# Patient Record
Sex: Female | Born: 1970 | Race: Black or African American | Hispanic: No | Smoking: Never smoker
Health system: Southern US, Community
[De-identification: ages and names within clinical notes are randomized; demographics above are authoritative.]

## PROBLEM LIST (undated history)

## (undated) DIAGNOSIS — F32A Depression, unspecified: Secondary | ICD-10-CM

## (undated) DIAGNOSIS — Z8709 Personal history of other diseases of the respiratory system: Secondary | ICD-10-CM

## (undated) DIAGNOSIS — N159 Renal tubulo-interstitial disease, unspecified: Secondary | ICD-10-CM

## (undated) DIAGNOSIS — N920 Excessive and frequent menstruation with regular cycle: Secondary | ICD-10-CM

## (undated) DIAGNOSIS — T8859XA Other complications of anesthesia, initial encounter: Secondary | ICD-10-CM

## (undated) DIAGNOSIS — K219 Gastro-esophageal reflux disease without esophagitis: Secondary | ICD-10-CM

## (undated) DIAGNOSIS — O139 Gestational [pregnancy-induced] hypertension without significant proteinuria, unspecified trimester: Secondary | ICD-10-CM

## (undated) DIAGNOSIS — Z87898 Personal history of other specified conditions: Secondary | ICD-10-CM

## (undated) DIAGNOSIS — G4730 Sleep apnea, unspecified: Secondary | ICD-10-CM

## (undated) DIAGNOSIS — O24419 Gestational diabetes mellitus in pregnancy, unspecified control: Secondary | ICD-10-CM

## (undated) DIAGNOSIS — A599 Trichomoniasis, unspecified: Secondary | ICD-10-CM

## (undated) DIAGNOSIS — Z9289 Personal history of other medical treatment: Secondary | ICD-10-CM

## (undated) DIAGNOSIS — J189 Pneumonia, unspecified organism: Secondary | ICD-10-CM

## (undated) DIAGNOSIS — R51 Headache: Secondary | ICD-10-CM

## (undated) DIAGNOSIS — Z86718 Personal history of other venous thrombosis and embolism: Secondary | ICD-10-CM

## (undated) DIAGNOSIS — F53 Postpartum depression: Secondary | ICD-10-CM

## (undated) DIAGNOSIS — T4145XA Adverse effect of unspecified anesthetic, initial encounter: Secondary | ICD-10-CM

## (undated) DIAGNOSIS — D219 Benign neoplasm of connective and other soft tissue, unspecified: Secondary | ICD-10-CM

## (undated) DIAGNOSIS — F329 Major depressive disorder, single episode, unspecified: Secondary | ICD-10-CM

## (undated) DIAGNOSIS — D649 Anemia, unspecified: Secondary | ICD-10-CM

## (undated) DIAGNOSIS — I10 Essential (primary) hypertension: Secondary | ICD-10-CM

## (undated) DIAGNOSIS — B9689 Other specified bacterial agents as the cause of diseases classified elsewhere: Secondary | ICD-10-CM

## (undated) DIAGNOSIS — O99345 Other mental disorders complicating the puerperium: Secondary | ICD-10-CM

## (undated) DIAGNOSIS — N760 Acute vaginitis: Secondary | ICD-10-CM

## (undated) HISTORY — DX: Personal history of other venous thrombosis and embolism: Z86.718

## (undated) HISTORY — DX: Headache: R51

## (undated) HISTORY — PX: WISDOM TOOTH EXTRACTION: SHX21

## (undated) HISTORY — DX: Postpartum depression: F53.0

## (undated) HISTORY — DX: Gestational diabetes mellitus in pregnancy, unspecified control: O24.419

## (undated) HISTORY — DX: Personal history of other medical treatment: Z92.89

## (undated) HISTORY — DX: Major depressive disorder, single episode, unspecified: F32.9

## (undated) HISTORY — DX: Depression, unspecified: F32.A

## (undated) HISTORY — DX: Other mental disorders complicating the puerperium: O99.345

## (undated) HISTORY — DX: Anemia, unspecified: D64.9

## (undated) HISTORY — DX: Essential (primary) hypertension: I10

## (undated) HISTORY — DX: Excessive and frequent menstruation with regular cycle: N92.0

## (undated) HISTORY — DX: Gestational (pregnancy-induced) hypertension without significant proteinuria, unspecified trimester: O13.9

## (undated) HISTORY — PX: TUBAL LIGATION: SHX77

## (undated) HISTORY — PX: DILATION AND CURETTAGE OF UTERUS: SHX78

## (undated) HISTORY — DX: Trichomoniasis, unspecified: A59.9

## (undated) HISTORY — DX: Benign neoplasm of connective and other soft tissue, unspecified: D21.9

## (undated) HISTORY — DX: Renal tubulo-interstitial disease, unspecified: N15.9

## (undated) HISTORY — DX: Acute vaginitis: N76.0

## (undated) HISTORY — DX: Other specified bacterial agents as the cause of diseases classified elsewhere: B96.89

---

## 1997-09-15 ENCOUNTER — Encounter: Admission: RE | Admit: 1997-09-15 | Discharge: 1997-09-15 | Payer: Self-pay | Admitting: *Deleted

## 1997-10-12 ENCOUNTER — Emergency Department (HOSPITAL_COMMUNITY): Admission: EM | Admit: 1997-10-12 | Discharge: 1997-10-12 | Payer: Self-pay | Admitting: Emergency Medicine

## 1998-07-03 ENCOUNTER — Encounter: Payer: Self-pay | Admitting: Emergency Medicine

## 1998-07-03 ENCOUNTER — Emergency Department (HOSPITAL_COMMUNITY): Admission: EM | Admit: 1998-07-03 | Discharge: 1998-07-03 | Payer: Self-pay | Admitting: Emergency Medicine

## 1998-11-12 ENCOUNTER — Ambulatory Visit (HOSPITAL_COMMUNITY): Admission: RE | Admit: 1998-11-12 | Discharge: 1998-11-12 | Payer: Self-pay | Admitting: Obstetrics

## 1999-01-17 ENCOUNTER — Encounter: Admission: RE | Admit: 1999-01-17 | Discharge: 1999-01-17 | Payer: Self-pay | Admitting: Obstetrics

## 1999-01-24 ENCOUNTER — Encounter: Admission: RE | Admit: 1999-01-24 | Discharge: 1999-01-24 | Payer: Self-pay | Admitting: Obstetrics

## 1999-03-29 ENCOUNTER — Inpatient Hospital Stay (HOSPITAL_COMMUNITY): Admission: AD | Admit: 1999-03-29 | Discharge: 1999-03-31 | Payer: Self-pay | Admitting: *Deleted

## 1999-03-30 ENCOUNTER — Encounter: Payer: Self-pay | Admitting: *Deleted

## 1999-04-03 ENCOUNTER — Encounter: Admission: RE | Admit: 1999-04-03 | Discharge: 1999-04-03 | Payer: Self-pay | Admitting: Obstetrics & Gynecology

## 1999-04-05 ENCOUNTER — Encounter (HOSPITAL_COMMUNITY): Admission: RE | Admit: 1999-04-05 | Discharge: 1999-04-10 | Payer: Self-pay | Admitting: *Deleted

## 1999-04-05 ENCOUNTER — Encounter: Admission: RE | Admit: 1999-04-05 | Discharge: 1999-07-04 | Payer: Self-pay | Admitting: Obstetrics and Gynecology

## 1999-04-09 ENCOUNTER — Inpatient Hospital Stay (HOSPITAL_COMMUNITY): Admission: AD | Admit: 1999-04-09 | Discharge: 1999-04-11 | Payer: Self-pay | Admitting: *Deleted

## 1999-04-09 ENCOUNTER — Encounter (INDEPENDENT_AMBULATORY_CARE_PROVIDER_SITE_OTHER): Payer: Self-pay

## 1999-04-13 ENCOUNTER — Inpatient Hospital Stay (HOSPITAL_COMMUNITY): Admission: AD | Admit: 1999-04-13 | Discharge: 1999-04-20 | Payer: Self-pay | Admitting: *Deleted

## 1999-04-16 ENCOUNTER — Encounter: Payer: Self-pay | Admitting: *Deleted

## 1999-04-19 ENCOUNTER — Encounter: Payer: Self-pay | Admitting: Obstetrics

## 1999-06-20 ENCOUNTER — Encounter: Admission: RE | Admit: 1999-06-20 | Discharge: 1999-06-20 | Payer: Self-pay | Admitting: Family Medicine

## 2000-02-11 ENCOUNTER — Emergency Department (HOSPITAL_COMMUNITY): Admission: EM | Admit: 2000-02-11 | Discharge: 2000-02-12 | Payer: Self-pay | Admitting: Emergency Medicine

## 2000-08-24 ENCOUNTER — Emergency Department (HOSPITAL_COMMUNITY): Admission: EM | Admit: 2000-08-24 | Discharge: 2000-08-24 | Payer: Self-pay | Admitting: Internal Medicine

## 2001-08-03 ENCOUNTER — Encounter: Admission: RE | Admit: 2001-08-03 | Discharge: 2001-08-03 | Payer: Self-pay | Admitting: Family Medicine

## 2001-08-03 ENCOUNTER — Other Ambulatory Visit: Admission: RE | Admit: 2001-08-03 | Discharge: 2001-08-03 | Payer: Self-pay | Admitting: Family Medicine

## 2001-08-24 ENCOUNTER — Encounter: Admission: RE | Admit: 2001-08-24 | Discharge: 2001-08-24 | Payer: Self-pay | Admitting: Family Medicine

## 2003-04-27 ENCOUNTER — Encounter: Admission: RE | Admit: 2003-04-27 | Discharge: 2003-04-27 | Payer: Self-pay | Admitting: Family Medicine

## 2003-05-11 ENCOUNTER — Encounter: Admission: RE | Admit: 2003-05-11 | Discharge: 2003-05-11 | Payer: Self-pay | Admitting: Family Medicine

## 2003-05-15 ENCOUNTER — Encounter: Admission: RE | Admit: 2003-05-15 | Discharge: 2003-05-15 | Payer: Self-pay | Admitting: Family Medicine

## 2003-05-25 ENCOUNTER — Other Ambulatory Visit: Admission: RE | Admit: 2003-05-25 | Discharge: 2003-05-25 | Payer: Self-pay | Admitting: Family Medicine

## 2003-05-25 ENCOUNTER — Encounter: Admission: RE | Admit: 2003-05-25 | Discharge: 2003-05-25 | Payer: Self-pay | Admitting: Family Medicine

## 2003-05-25 ENCOUNTER — Encounter: Payer: Self-pay | Admitting: Family Medicine

## 2003-06-06 ENCOUNTER — Encounter: Admission: RE | Admit: 2003-06-06 | Discharge: 2003-06-06 | Payer: Self-pay | Admitting: Family Medicine

## 2003-06-29 ENCOUNTER — Encounter: Admission: RE | Admit: 2003-06-29 | Discharge: 2003-09-27 | Payer: Self-pay | Admitting: Family Medicine

## 2003-07-04 ENCOUNTER — Encounter: Admission: RE | Admit: 2003-07-04 | Discharge: 2003-07-04 | Payer: Self-pay | Admitting: Family Medicine

## 2004-09-26 ENCOUNTER — Ambulatory Visit: Payer: Self-pay | Admitting: Family Medicine

## 2004-10-29 ENCOUNTER — Ambulatory Visit: Payer: Self-pay | Admitting: Sports Medicine

## 2004-11-12 ENCOUNTER — Emergency Department (HOSPITAL_COMMUNITY): Admission: EM | Admit: 2004-11-12 | Discharge: 2004-11-13 | Payer: Self-pay | Admitting: Emergency Medicine

## 2004-12-12 ENCOUNTER — Ambulatory Visit: Payer: Self-pay | Admitting: Family Medicine

## 2005-09-28 ENCOUNTER — Encounter (INDEPENDENT_AMBULATORY_CARE_PROVIDER_SITE_OTHER): Payer: Self-pay | Admitting: *Deleted

## 2005-09-28 LAB — CONVERTED CEMR LAB

## 2005-10-07 ENCOUNTER — Other Ambulatory Visit: Admission: RE | Admit: 2005-10-07 | Discharge: 2005-10-07 | Payer: Self-pay | Admitting: Obstetrics and Gynecology

## 2005-10-16 ENCOUNTER — Ambulatory Visit: Payer: Self-pay | Admitting: Family Medicine

## 2005-11-04 ENCOUNTER — Emergency Department (HOSPITAL_COMMUNITY): Admission: EM | Admit: 2005-11-04 | Discharge: 2005-11-04 | Payer: Self-pay | Admitting: Family Medicine

## 2005-11-25 ENCOUNTER — Ambulatory Visit: Payer: Self-pay | Admitting: Family Medicine

## 2006-03-31 DIAGNOSIS — B9689 Other specified bacterial agents as the cause of diseases classified elsewhere: Secondary | ICD-10-CM

## 2006-03-31 HISTORY — DX: Other specified bacterial agents as the cause of diseases classified elsewhere: B96.89

## 2006-05-28 DIAGNOSIS — I10 Essential (primary) hypertension: Secondary | ICD-10-CM

## 2006-05-28 DIAGNOSIS — F329 Major depressive disorder, single episode, unspecified: Secondary | ICD-10-CM

## 2006-05-28 DIAGNOSIS — E119 Type 2 diabetes mellitus without complications: Secondary | ICD-10-CM

## 2006-05-29 ENCOUNTER — Encounter (INDEPENDENT_AMBULATORY_CARE_PROVIDER_SITE_OTHER): Payer: Self-pay | Admitting: *Deleted

## 2006-07-12 ENCOUNTER — Emergency Department (HOSPITAL_COMMUNITY): Admission: EM | Admit: 2006-07-12 | Discharge: 2006-07-12 | Payer: Self-pay | Admitting: Emergency Medicine

## 2006-07-14 ENCOUNTER — Ambulatory Visit: Payer: Self-pay | Admitting: Family Medicine

## 2006-07-14 DIAGNOSIS — J301 Allergic rhinitis due to pollen: Secondary | ICD-10-CM | POA: Insufficient documentation

## 2006-07-14 LAB — CONVERTED CEMR LAB: Hgb A1c MFr Bld: 7.6 %

## 2006-07-23 ENCOUNTER — Ambulatory Visit: Payer: Self-pay | Admitting: Family Medicine

## 2006-07-23 ENCOUNTER — Telehealth: Payer: Self-pay | Admitting: Family Medicine

## 2006-07-30 ENCOUNTER — Telehealth (INDEPENDENT_AMBULATORY_CARE_PROVIDER_SITE_OTHER): Payer: Self-pay | Admitting: *Deleted

## 2007-02-07 ENCOUNTER — Observation Stay (HOSPITAL_COMMUNITY): Admission: AD | Admit: 2007-02-07 | Discharge: 2007-02-08 | Payer: Self-pay | Admitting: Family Medicine

## 2007-02-07 ENCOUNTER — Ambulatory Visit: Payer: Self-pay | Admitting: Family Medicine

## 2007-02-07 ENCOUNTER — Encounter: Payer: Self-pay | Admitting: Emergency Medicine

## 2007-02-07 ENCOUNTER — Telehealth (INDEPENDENT_AMBULATORY_CARE_PROVIDER_SITE_OTHER): Payer: Self-pay | Admitting: Family Medicine

## 2007-06-28 ENCOUNTER — Telehealth: Payer: Self-pay | Admitting: *Deleted

## 2007-08-03 ENCOUNTER — Ambulatory Visit: Payer: Self-pay | Admitting: Family Medicine

## 2007-08-03 ENCOUNTER — Encounter: Payer: Self-pay | Admitting: *Deleted

## 2007-08-10 ENCOUNTER — Ambulatory Visit: Payer: Self-pay | Admitting: Family Medicine

## 2008-07-05 ENCOUNTER — Encounter: Payer: Self-pay | Admitting: Family Medicine

## 2008-10-10 ENCOUNTER — Encounter: Payer: Self-pay | Admitting: Family Medicine

## 2008-12-28 ENCOUNTER — Emergency Department (HOSPITAL_COMMUNITY): Admission: EM | Admit: 2008-12-28 | Discharge: 2008-12-28 | Payer: Self-pay | Admitting: Family Medicine

## 2009-01-26 ENCOUNTER — Telehealth: Payer: Self-pay | Admitting: Family Medicine

## 2009-02-09 ENCOUNTER — Encounter: Payer: Self-pay | Admitting: Family Medicine

## 2009-02-09 ENCOUNTER — Ambulatory Visit: Payer: Self-pay | Admitting: Family Medicine

## 2009-02-09 LAB — CONVERTED CEMR LAB
Albumin: 3.9 g/dL (ref 3.5–5.2)
BUN: 10 mg/dL (ref 6–23)
CO2: 20 meq/L (ref 19–32)
Calcium: 8.7 mg/dL (ref 8.4–10.5)
Chloride: 105 meq/L (ref 96–112)
Glucose, Bld: 151 mg/dL — ABNORMAL HIGH (ref 70–99)
HDL: 38 mg/dL — ABNORMAL LOW (ref >39)
LDL Cholesterol: 45 mg/dL (ref 0–99)
Potassium: 4.4 meq/L (ref 3.5–5.3)
Sodium: 139 meq/L (ref 135–145)
Total Protein: 6.6 g/dL (ref 6.0–8.3)
Triglycerides: 95 mg/dL (ref <150)

## 2009-02-13 ENCOUNTER — Encounter: Payer: Self-pay | Admitting: Family Medicine

## 2009-02-13 ENCOUNTER — Ambulatory Visit: Payer: Self-pay | Admitting: Family Medicine

## 2009-02-13 ENCOUNTER — Other Ambulatory Visit: Admission: RE | Admit: 2009-02-13 | Discharge: 2009-02-13 | Payer: Self-pay | Admitting: Family Medicine

## 2009-02-13 LAB — CONVERTED CEMR LAB
Chlamydia, DNA Probe: NEGATIVE
GC Probe Amp, Genital: NEGATIVE

## 2009-02-14 ENCOUNTER — Telehealth: Payer: Self-pay | Admitting: Family Medicine

## 2009-03-27 ENCOUNTER — Encounter: Payer: Self-pay | Admitting: Family Medicine

## 2009-06-21 ENCOUNTER — Encounter: Payer: Self-pay | Admitting: Family Medicine

## 2009-09-14 ENCOUNTER — Encounter: Payer: Self-pay | Admitting: Family Medicine

## 2010-03-29 ENCOUNTER — Ambulatory Visit
Admission: RE | Admit: 2010-03-29 | Discharge: 2010-03-29 | Payer: Self-pay | Source: Home / Self Care | Attending: Family Medicine | Admitting: Family Medicine

## 2010-03-29 ENCOUNTER — Encounter: Payer: Self-pay | Admitting: Family Medicine

## 2010-03-29 ENCOUNTER — Ambulatory Visit: Payer: Self-pay

## 2010-03-29 LAB — CONVERTED CEMR LAB
BUN: 11 mg/dL (ref 6–23)
Chloride: 109 meq/L (ref 96–112)
Cholesterol: 101 mg/dL (ref 0–200)
Creatinine, Ser: 0.84 mg/dL (ref 0.40–1.20)
HDL: 45 mg/dL (ref >39)
Hgb A1c MFr Bld: 6.5 %
LDL Cholesterol: 36 mg/dL (ref 0–99)
Potassium: 4.3 meq/L (ref 3.5–5.3)
Triglycerides: 98 mg/dL (ref <150)

## 2010-04-05 ENCOUNTER — Telehealth (INDEPENDENT_AMBULATORY_CARE_PROVIDER_SITE_OTHER): Payer: Self-pay | Admitting: *Deleted

## 2010-04-30 NOTE — Miscellaneous (Signed)
  Clinical Lists Changes  Problems: Removed problem of SCREENING FOR MALIGNANT NEOPLASM OF THE CERVIX (ICD-V76.2) Removed problem of ROUTINE GYNECOLOGICAL EXAMINATION (ICD-V72.31) Removed problem of VAGINAL DISCHARGE (ICD-623.5) Medications: Removed medication of VENTOLIN HFA 108 (90 BASE) MCG/ACT AERS (ALBUTEROL SULFATE) 2 puffs every 4 hours as needed for wheezing Removed medication of GLIPIZIDE 10 MG TABS (GLIPIZIDE) Take 1 tablet by mouth once a day

## 2010-04-30 NOTE — Miscellaneous (Signed)
  Clinical Lists Changes  Medications: Removed medication of ZOFRAN 4 MG TABS (ONDANSETRON HCL) 1 tab q6 as needed for nausea and vomiting Removed medication of LISINOPRIL-HYDROCHLOROTHIAZIDE 20-25 MG TABS (LISINOPRIL-HYDROCHLOROTHIAZIDE) one tab by mouth daily Removed medication of PROZAC 40 MG  CAPS (FLUOXETINE HCL) one tab by mouth daily

## 2010-05-02 NOTE — Assessment & Plan Note (Signed)
Summary: CPE   Vital Signs:  Patient profile:   40 year old female Height:      64.5 inches Weight:      244.5 pounds BMI:     41.47 Temp:     98.7 degrees F oral Pulse rate:   79 / minute BP sitting:   155 / 94  (left arm) Cuff size:   large  Vitals Entered By: Garen Grams LPN (March 29, 2010 3:30 PM) CC: cpe Is Patient Diabetic? Yes Did you bring your meter with you today? No Pain Assessment Patient in pain? no        Primary Provider:  Dessa Phi MD  CC:  cpe.  History of Present Illness: Pt here for f/u last seen in clinic in november 2010. Not taking meds. Here to re-establish care and to have health insurance form completed.   1. HTN- longstanding. Untreated currently. Denies CP,  SOB, cough, HAs.   2. DM- longstading and untreated. Not check CBGs. Denies blurred vision, polydypsia or polyuria, tinling or numbness in extremities.   3. Morbid obesity. Down 10 lbs from last year. Highly interested in continued weight-loss. Plans to f/u with nutritionist to discuss herself and her daughter. Would like to start an exercise program.   LMP 2 weeks ago. Last pap 1 yr ago.     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  Denies tobacco, ETOH, IDU.   Physical Exam  General:  Awake alert . Eyes:  vision grossly intact, pupils equal, pupils round, and pupils reactive to light.   Lungs:  normal respiratory effort, no intercostal retractions, no accessory muscle use, and normal breath sounds.   Heart:  normal rate, regular rhythm, no murmur, and no rub.     Impression & Recommendations:  Problem # 1:  HYPERTENSION, BENIGN SYSTEMIC  (ICD-401.1) Uncontrolled.  Her updated medication list for this problem includes:    Lisinopril-hydrochlorothiazide 10-12.5 Mg Tabs (Lisinopril-hydrochlorothiazide) ..... One tab daily  Orders: Basic Met-[facility] [id number]) Lipid-[facility] ([id number]) [facility]- Est  Level 4 (29562)  Problem # 2:  DIABETES MELLITUS II, UNCOMPLICATED (ICD-250.00) Uncontrolled.  Her updated medication list for this problem includes:    Glipizide 5 Mg Tabs (Glipizide) ..... One tab daily    Lisinopril-hydrochlorothiazide 10-12.5 Mg Tabs (Lisinopril-hydrochlorothiazide) ..... One tab daily  Orders: A1C-[facility] (13086) [facility]- Est  Level 4 (57846)  Problem # 3:  MORBID OBESITY (ICD-278.01) See pt instructions for details.  Orders: [facility]- Est  Level 4 (96295)  Problem # 4:  DEPRESSIVE DISORDER, NOS (ICD-311) Restart Prozac at pt's request.  Her updated medication list for this problem includes:    Prozac 20 Mg Caps (Fluoxetine hcl) ..... One tab by mouth each morning  Complete Medication List: 1)  Onetouch Ultra Test Strp (Glucose blood) .... Use as directed 2)  Glipizide 5 Mg Tabs (Glipizide) .... One tab daily 3)  Lisinopril-hydrochlorothiazide 10-12.5 Mg Tabs (Lisinopril-hydrochlorothiazide) .... One tab daily 4)  Prozac 20 Mg Caps (Fluoxetine hcl) .... One tab by mouth each morning  Patient Instructions: 1)  Julia Dunn, 2)  It was great seeing you again.  3)  Walking program: 3x/week, 2.5 miles. Goal weightloss 2-4 lbs a month.  4)  HTN: restarting Lisinopril-HCTZ 10-12.5 mg Po q day.  5)  DDM: Glipizide 5 mg Po q day.  6)  F/u in 2 weeks. 7)  -Dr. Armen Pickup Prescriptions: PROZAC 20 MG CAPS (FLUOXETINE HCL) one tab by mouth each morning  #30 x 0   Entered and Authorized by:   Dessa Phi MD   Signed by:   Dessa Phi MD on 03/29/2010   Method used:   [facility]        [facility]  [address] [phone]* (retail)       [address]       [place], [place]  [address]       Ph:  [phone]       Fax: [phone]   RxID:   [id number] LISINOPRIL-HYDROCHLOROTHIAZIDE 10-12.5 MG TABS (LISINOPRIL-HYDROCHLOROTHIAZIDE) one tab daily  #30 x 1   Entered and Authorized by:   Dessa Phi MD   Signed by:   Dessa Phi MD on 03/29/2010   Method used:   [facility]        [facility]  [address] [phone]* (retail)       [address]       [place], [place]  [address]       Ph: [phone]       Fax: [phone]   RxID:   [id number] GLIPIZIDE 5 MG TABS (GLIPIZIDE) one tab daily  #30 x 1   Entered and Authorized by:   Dessa Phi MD   Signed by:   Dessa Phi MD on 03/29/2010   Method used:   [facility]        [facility]  [address] [phone]* (retail)       [address]       [place], [place]  [address]       Ph: [phone]       Fax: [phone]   RxID:   [id number]    Orders Added: 1)  Basic Met-[facility] [[id number]] 2)  A1C-[facility] [83036] 3)  Lipid-[facility] [80061-22930] 4)  [facility]- Est  Level 4 [[address]]    Laboratory Results   Blood Tests   Date/Time Received: March 29, 2010 5:03 PM  Date/Time Reported: March 29, 2010 5:34 PM   HGBA1C: 6.5%   (Normal Range: Non-Diabetic - 3-6%   Control Diabetic - 6-8%)  Comments: ...............test performed by......Marland KitchenBonnie A. [place], MLS (ASCP)cm    [facility]. Theresia Lo RN  April 05, 2010 1:39 PM   Prevention & Chronic Care Immunizations   Influenza vaccine: Fluvax 3+  (02/09/2009)    Tetanus booster: Not documented    Pneumococcal vaccine: Not documented  Other Screening   Pap smear: Done.  (09/28/2005)   Smoking status: never  (03/29/2010)  Diabetes Mellitus   HgbA1C: 6.5  (03/29/2010)    Eye exam: Not documented    Foot exam: yes  (02/13/2009)   High risk foot: Not documented   Foot care education: Not documented    Urine microalbumin/creatinine ratio: Not documented  Lipids    Total Cholesterol: 102  (02/09/2009)   LDL: 45  (02/09/2009)   LDL Direct: Not documented   HDL: 38  (02/09/2009)   Triglycerides: 95  (02/09/2009)  Hypertension   Last Blood Pressure: 155 / 94  (03/29/2010)   Serum creatinine: 0.72  (02/09/2009)  Serum potassium 4.4  (02/09/2009)  Self-Management Support :   Personal Goals (by the next clinic visit) :     Personal A1C goal: 7  (02/13/2009)     Personal blood pressure goal: 130/80  (02/13/2009)     Personal LDL goal: 100  (02/13/2009)    Diabetes self-management support: Written self-care plan  (02/13/2009)    Hypertension self-management support: Written self-care plan  (02/13/2009)

## 2010-05-02 NOTE — Progress Notes (Signed)
Summary: Rx  Phone Note Call from Patient Call back at Home Phone [phone]   Reason for Call: Talk to Nurse Summary of Call: pt needs rxs that were prescribed last week [facility] [facility] Initial call taken by: Knox Royalty,  April 05, 2010 12:15 PM  Follow-up for Phone Call        sent as requested to [facility] [address] Follow-up by: Theresia Lo RN,  April 05, 2010 1:40 PM

## 2010-05-12 ENCOUNTER — Encounter: Payer: Self-pay | Admitting: *Deleted

## 2010-05-13 ENCOUNTER — Encounter: Payer: Self-pay | Admitting: Family Medicine

## 2010-05-22 NOTE — Telephone Encounter (Signed)
This encounter was created in error - please disregard.

## 2010-08-13 NOTE — H&P (Signed)
Julia Dunn, Julia Dunn                 ACCOUNT NO.:  [id number]   MEDICAL RECORD NO.:  [id number]          PATIENT TYPE:  OUT   LOCATION:  CATS                         FACILITY:  [facility]   PHYSICIAN:  Altamese Cabal, M.D.  DATE OF BIRTH:  1971/01/24   DATE OF ADMISSION:  02/07/2007  DATE OF DISCHARGE:                              HISTORY & PHYSICAL   PRIMARY CARE PHYSICIAN:  Corrie Mckusick, M.D., [facility]   CHIEF COMPLAINT:  Abdominal, back pain and fever.   HISTORY OF PRESENT ILLNESS:  This patient is a 40 year old female with  type 2 diabetes, hypertension, obesity, and a depressive disorder who  presents with a 6-day history of worsening lower abdominal and back  pain.  The patient has been in bed with malaise, nausea and poor p.o.  intake since this time.  The pain is across her lower back and she also  feels a harness in her lower abdomen with constant pain.  The patient  also has increased urinary frequency and urgency.  No dysuria or  hematuria.  The patient is sexually active and currently on her menses.  No  sick contacts.  The patient is not checking her sugars regularly.  She thinks they usually run in the 200 to 300.  Last check was about  290.   REVIEW OF SYSTEMS:  Positive for having period, blurred vision,  neuropathy, and some loose stools.  Negative for chest pain, shortness  of breath, cough, sore throat.   PAST MEDICAL HISTORY:  1. Type 2 diabetes.  Last A1c 7.6.  Diabetes was diagnosed in 2006.  2. Depressive disorder.  3. Hypertension.  4. Migraines.  5. Menorrhagia.  6. Asthma.   PAST SURGICAL HISTORY:  Bilateral tubal ligation in 2001.   MEDICATIONS:  1. Enalapril 10 mg p.o. daily.  2. Glipizide 10 mg p.o. daily.  3. HCTZ 25 p.o. daily.  4. Metformin 1000 p.o. b.i.d.  5. Prozac 20 p.o. daily.  6. Albuterol p.r.n.   ALLERGIES:  1. PENICILLIN causes hives.  2. DOXYCYCLINE causes itching.   FAMILY HISTORY:  The patient has  a daughter with ADD.  Her mother and  multiple other family members with diabetes.  She also has aunts and her  mother with kidney stones.  She also has  seven aunts that have had  hysterectomies for unknown reasons.   SOCIAL HISTORY:  The patient has three children ages 110, 30 and 19.  She  works at home for [employer].  No tobacco, no alcohol, no drugs.   PHYSICAL EXAMINATION:  VITAL SIGNS:  Temperature 103 down to 100.1  currently.  Blood pressure 134/105, pulse 95, respirations 18, oxygen  saturation  100% on room air.  CBG 124.  GENERAL:  The patient alert and oriented, very pleasant female in no acute  distress.  HEENT:  Pupils equal and reactive to light accommodation.  Extraocular  is intact.  Dry mucous membranes.  NECK:  No lymphadenopathy.  CARDIOVASCULAR:  Regular rate and rhythm.  No murmurs, rubs or gallops.  LUNGS:  Clear  to auscultation bilaterally.  Normal work of breathing.  Positive right CVA tenderness.  No left CVA tenderness.  ABDOMEN:  Obese. Normoactive bowel sounds, mild to moderate tenderness  to palpation in the upper abdomen and right lower quadrant.  PELVIC:  Pelvic exam done at Urgent Care per report.  No discharge or  cervical motion tenderness.  EXTREMITIES:  No clubbing, cyanosis or edema.  NEUROLOGIC:  Neuro exam.  Cranial nerves II-XII grossly intact.   LABORATORY DATA AND STUDIES:  A CT of the abdomen showed a right upper  pole pyelonephritis, no abscess.  There is surrounding inflammatory  stranding around there upper pole of the kidney and report said to  consider renal cell carcinoma, but doubtful.  Also questions early mid  left pyelonephritis.  Uterus also noted to be 13 cm.  Wet prep showed a  few clue cells.  No yeast. Urine pregnancy negative.  Chlamydia and  gonorrhea pending.  Sodium 130, potassium 3.8, bicarb 26, BUN 8,  creatinine 1.2, glucose 155.  White count 15.6, hemoglobin 12.8,  hematocrit 38.3, platelets 284.  Urinalysis  showed 15 ketones, large  blood, small LE .  No nitrites, no glucose.  Protein is 30.  Micro  showed few squamous, some bacteria, 3-6 red blood cells, 3-6 white blood  cells.   ASSESSMENT/PLAN:  This is a 40 year old female with acute  pyelonephritis.  Problem #1.  Pyelonephritis.  Will admit the patient and start on IV  Cipro since she has a penicillin allergy.  There is no abscess on CT  scan.  We can change the patient to p.o. antibiotics when she is  afebrile.  She needs a 14-day course.  Will give Vicodin and  Phenergan  for pain and nausea.   Problem #2.  Type 2 diabetes.  We will check an A1c.  CBG seemed fairly  well-controlled so far.  The patient is not taking much p.o. so we will  just cover her for sliding scale insulin and hold metformin and  glipizide for now.  The patient also had a CT with contrast so do not  want to give  metformin with a creatinine 1.2.   Problem #3.  Acute renal failure.  This is mild her creatinine 1.2 which  is likely prerenal secondary to hypovolemia.  Will give IV fluids.  Will  hold metformin again since she has just had a CT with IV contrast.   Problem #4.  Dehydration.  This is likely secondary to infection and  poor p.o. intake.  Will give normal saline at 150 mL per hour.  Monitor  strict Is and Os.   Problem #5.  Hypertension.  Will monitor.  Will start an  antihypertensive if needed.  We are holding her ACE inhibitor and HCTZ  due to mild dehydration.   Problem #6.  Depression.  Continue Prozac.   Problem #7.  Menorrhagia.  This will need  outpatient work-up.   Problem #8.  Prophylaxis.  She will be on Protonix for GI prophylaxis  and SCDs for DVT prophylaxis.      Altamese Cabal, M.D.  Electronically Signed     KS/MEDQ  D:  02/07/2007  T:  02/08/2007  Job:  [id number]

## 2010-08-13 NOTE — Discharge Summary (Signed)
NAMEARDYS, HATAWAY                 ACCOUNT NO.:  [id number]   MEDICAL RECORD NO.:  [id number]          PATIENT TYPE:  INP   LOCATION:  5504                         FACILITY:  [facility]   PHYSICIAN:  Leighton Roach McDiarmid, M.D.DATE OF BIRTH:  [birth year]   DATE OF ADMISSION:  02/07/2007  DATE OF DISCHARGE:  02/08/2007                               DISCHARGE SUMMARY   PRIMARY CARE PHYSICIAN:  Dr. Lequita Asal at [facility]   REASON FOR HOSPITALIZATION:  Abdominal pain, back pain and fever.   DISCHARGE DIAGNOSES:  1. Acute pyelonephritis of right and left kidneys.  2. Type 2 diabetes.  3. Depression.  4. Hypertension.  5. Menorrhagia.  6. Asthma.   DISCHARGE MEDICATIONS:  1. Prozac 40 mg p.o. daily.  2. Ciprofloxacin XR 1000 mg p.o. daily x10 days.  3. Enalapril 10 mg p.o. daily.  4. Glipizide 10 mg p.o. daily.  5. Metformin 1000 mg p.o. b.i.d.  6. Vicodin 5/325 one tab p.o. q.4 to q.6 hours p.r.n. pain, number 30,      no refills.   HOSPITAL COURSE:  The patient is a 40 year old female who presented to  urgent care with a 6 day history of worsening lower abdominal pain and  back pain.  She also complained of malaise, nausea and poor p.o. intake.   1. Acute pyelonephritis:  At the urgent care, the patient had a      urinalysis done, which findings were consistent with an infection.      Her temperature at the urgent care was 103.  She was sent to [facility] for direct admission, as well as a CT scan.  The patient's CT      scan showed focal pyelonephritis in the right kidney upper pole      without a definite well formed abscess.  There were also faint      densities in the left kidney, which could represent early/mild left      pyelonephritis.  The patient also had mild bibasilar subsegmental      atelectasis.  She was started on IV ciprofloxacin given her      PENICILLIN allergy.  On day of discharge, she had some mild      symptomatic improvement and  was discharged home on a 10 day course      of oral Cipro ER.  We will await urine culture results and change      antibiotic coverage if necessary.  2. Diabetes mellitus type 2:  The patient reported that she does not      currently check her sugars.  She is found to have a hemoglobin A1c      of 6.8.  She is placed on sliding scale insulin and her metformin      and glipizide were held secondary to a creatinine of 1.2.  On      discharge, she was restarted on her glipizide, as well as her      metformin.  3. Acute renal failure:  The patient's admission creatinine was 1.2,  which is likely prerenal secondary to hypovolemia.  She was given      IV fluids and on the day of discharge, her creatinine improved to      0.89.  4. Dehydration:  This is likely secondary to patient's infection, as      well as poor p.o. intake.  This improved at normal saline at 150 mL      per hour and by discharge, the patient was tolerating p.o. well.  5. Hypertension:  The patient was actually found to have normal blood      pressures during this admission, so her blood pressure medications      were held.  On day of discharge, her blood pressure was 118/75, so      she was restarted on her ACE inhibitor, but not on HCTZ.  6. Depression:  The patient was continued on her home regimen of      Prozac 20 mg p.o. daily; however, on day of discharge, she did      appear tearful with a flat affect, so her Prozac was increased to      40 mg p.o. daily.   CONDITION ON DISCHARGE:  Stable.   PENDING TEST RESULTS:  1. Urine culture.  2. Gonorrhea.  3. Chlamydia.  4. Blood cultures x2.   DISPOSITION:  The patient is discharged to home.   FOLLOWUP:  The patient is to follow up with Dr. Lanier Prude at the [facility] on February 18, 2007 at 1:30 p.m.  Sodium level and  potassium level.   ISSUES:  1. Blood pressures - we will need to consider if patient needs to      restart HCTZ.  2. Depression  -  patient seemed to be depressed during her admission.      We will need to followup to determine the effectiveness of      increased dose of Prozac.   DISCHARGE LABS:  Hemoglobin A1c 6.8, BUN 6, creatinine 0.89, sodium 133,  potassium 3.2, white blood cell count 7.9, hemoglobin 11.7, platelets  267.      Lauro Franklin, MD  Electronically Signed      Leighton Roach McDiarmid, M.D.  Electronically Signed    TCB/MEDQ  D:  02/08/2007  T:  02/08/2007  Job:  [id number]

## 2010-09-26 ENCOUNTER — Telehealth: Payer: Self-pay | Admitting: Family Medicine

## 2010-09-26 NOTE — Telephone Encounter (Signed)
Patient very upset and worried. Her periods have become progressively heavy.  States she is now at a point where there is only a two week break in between her cycle.  Advised her that she should be seen so put her in with Dr. Jennette Kettle tomorrow afternoon.

## 2010-09-26 NOTE — Telephone Encounter (Signed)
Julia Dunn has called very upset because she is bleeding very heavily and wants to be seen emergently.  I suggested that she go to the ER or Urgent Care because we do not have any opening, but she really wants to speak with someone right away.

## 2010-09-27 ENCOUNTER — Encounter: Payer: Self-pay | Admitting: Family Medicine

## 2010-09-27 ENCOUNTER — Ambulatory Visit (INDEPENDENT_AMBULATORY_CARE_PROVIDER_SITE_OTHER): Payer: 59 | Admitting: Family Medicine

## 2010-09-27 VITALS — BP 161/97 | HR 82 | Temp 98.6°F | Ht 64.75 in | Wt 239.2 lb

## 2010-09-27 DIAGNOSIS — E119 Type 2 diabetes mellitus without complications: Secondary | ICD-10-CM

## 2010-09-27 DIAGNOSIS — N920 Excessive and frequent menstruation with regular cycle: Secondary | ICD-10-CM

## 2010-09-27 DIAGNOSIS — N921 Excessive and frequent menstruation with irregular cycle: Secondary | ICD-10-CM

## 2010-09-27 HISTORY — DX: Excessive and frequent menstruation with regular cycle: N92.0

## 2010-09-27 LAB — POCT GLYCOSYLATED HEMOGLOBIN (HGB A1C): Hemoglobin A1C: 6.2

## 2010-09-27 LAB — COMPREHENSIVE METABOLIC PANEL
Albumin: 4.3 g/dL (ref 3.5–5.2)
BUN: 13 mg/dL (ref 6–23)
CO2: 25 mEq/L (ref 19–32)
Calcium: 9.1 mg/dL (ref 8.4–10.5)
Chloride: 107 mEq/L (ref 96–112)
Glucose, Bld: 93 mg/dL (ref 70–99)
Potassium: 4.1 mEq/L (ref 3.5–5.3)

## 2010-09-27 LAB — CBC
HCT: 26.3 % — ABNORMAL LOW (ref 36.0–46.0)
Hemoglobin: 7.5 g/dL — ABNORMAL LOW (ref 12.0–15.0)
WBC: 8 10*3/uL (ref 4.0–10.5)

## 2010-09-27 LAB — POCT URINE PREGNANCY: Preg Test, Ur: NEGATIVE

## 2010-09-27 LAB — TSH: TSH: 0.75 u[IU]/mL (ref 0.350–4.500)

## 2010-09-27 MED ORDER — MEDROXYPROGESTERONE ACETATE 10 MG PO TABS: ORAL_TABLET | ORAL | Status: DC

## 2010-10-01 ENCOUNTER — Telehealth: Payer: Self-pay | Admitting: Family Medicine

## 2010-10-01 DIAGNOSIS — D649 Anemia, unspecified: Secondary | ICD-10-CM

## 2010-10-01 NOTE — Progress Notes (Signed)
  Subjective:    Patient ID: Julia Dunn, female    DOB: February 14, 1971, 40 y.o.   MRN: [id number]  HPI Here for complaint of extremely heavy menses. Has had this off and on for greater than 3 years. She has previously seen Dr. Stefano Gaul and was considering hysterectomy or endometrial ablation. She put it off. She is now ready to be consider that. She's bleeding more than 25 days a month. She has a lot of clots. She does not have much pelvic pain. She denies chest pain or shortness of breath.   Review of Systems See history of present illness    Objective:   Physical Exam    GENERAL: Well-developed female no acute distress  CARDIOVASCULAR: Regular rate and rhythm ABDOMEN: Soft positive bowel sounds    Assessment & Plan:  Menorrhagia. We'll treat with high-dose Provera for 10 days. She will have him see Dr. Stefano Gaul back for further evaluation of menorrhagia. If she needs help getting that from a set up she'll let us know.

## 2010-10-01 NOTE — Telephone Encounter (Signed)
Spoke w her re her labs We need to start otc iron--take 2-3 tabs a day --retrurn for lab only in 4 weeks Rest of labs ok

## 2011-01-07 LAB — URINALYSIS, ROUTINE W REFLEX MICROSCOPIC
Glucose, UA: NEGATIVE
Protein, ur: 30 — AB
Specific Gravity, Urine: 1.021

## 2011-01-07 LAB — BASIC METABOLIC PANEL
Calcium: 8.5
Creatinine, Ser: 0.89
GFR calc Af Amer: >60
GFR calc non Af Amer: >60
Glucose, Bld: 181 — ABNORMAL HIGH
Sodium: 133 — ABNORMAL LOW

## 2011-01-07 LAB — I-STAT 8, (EC8 V) (CONVERTED LAB)
Acid-Base Excess: 6 — ABNORMAL HIGH
Bicarbonate: 26.7 — ABNORMAL HIGH
HCT: 43
Operator id: 235561
Sodium: 130 — ABNORMAL LOW
pCO2, Ven: 26.8 — ABNORMAL LOW

## 2011-01-07 LAB — CBC
HCT: 38.3
Hemoglobin: 11.7 — ABNORMAL LOW
MCV: 84.8
Platelets: 284
RDW: 13.6
RDW: 13.7

## 2011-01-07 LAB — URINE CULTURE: Culture: NO GROWTH

## 2011-01-07 LAB — CULTURE, BLOOD (ROUTINE X 2)
Culture: NO GROWTH
Culture: NO GROWTH

## 2011-01-07 LAB — POCT URINALYSIS DIP (DEVICE)
Glucose, UA: NEGATIVE
Ketones, ur: 15 — AB
Operator id: 116391
Specific Gravity, Urine: 1.02
Urobilinogen, UA: 1

## 2011-01-07 LAB — URINE MICROSCOPIC-ADD ON

## 2011-01-07 LAB — HEMOGLOBIN A1C
Hgb A1c MFr Bld: 6.8 — ABNORMAL HIGH
Mean Plasma Glucose: 165

## 2011-01-07 LAB — DIFFERENTIAL
Basophils Absolute: 0
Eosinophils Absolute: 0
Eosinophils Relative: 0
Lymphocytes Relative: 16
Neutrophils Relative %: 69

## 2011-01-07 LAB — WET PREP, GENITAL
Trich, Wet Prep: NONE SEEN
Yeast Wet Prep HPF POC: NONE SEEN

## 2011-01-07 LAB — POCT I-STAT CREATININE
Creatinine, Ser: 1.2
Operator id: 116391

## 2011-01-07 LAB — GRAM STAIN

## 2011-03-11 ENCOUNTER — Other Ambulatory Visit: Payer: Self-pay | Admitting: Family Medicine

## 2011-03-11 NOTE — Telephone Encounter (Signed)
Refill request

## 2011-03-11 NOTE — Telephone Encounter (Signed)
Refills approved. Pt to f/u with me for additional refills.

## 2011-03-19 ENCOUNTER — Encounter: Payer: 59 | Admitting: Family Medicine

## 2011-03-19 ENCOUNTER — Other Ambulatory Visit: Payer: 59

## 2011-03-19 ENCOUNTER — Telehealth: Payer: Self-pay | Admitting: Family Medicine

## 2011-03-19 DIAGNOSIS — D649 Anemia, unspecified: Secondary | ICD-10-CM

## 2011-03-19 NOTE — Progress Notes (Signed)
Cbc done today Julia Dunn 

## 2011-03-20 ENCOUNTER — Telehealth: Payer: Self-pay | Admitting: Family Medicine

## 2011-03-20 LAB — CBC
HCT: 25.7 % — ABNORMAL LOW (ref 36.0–46.0)
MCHC: 26.4 g/dL — ABNORMAL LOW (ref 30.0–36.0)
Platelets: 272 10*3/uL (ref 150–400)
RDW: 19.3 % — ABNORMAL HIGH (ref 11.5–15.5)
WBC: 7.1 10*3/uL (ref 4.0–10.5)

## 2011-03-20 MED ORDER — FERROUS SULFATE 325 (65 FE) MG PO TABS: 325.0000 mg | ORAL_TABLET | Freq: Three times a day (TID) | ORAL | Status: DC

## 2011-03-20 NOTE — Telephone Encounter (Signed)
Message copied by Dessa Phi on Thu Mar 20, 2011  2:19 PM ------      Message from: Denny Levy L      Created: Thu Mar 20, 2011 10:05 AM                   ----- Message -----         From: [facility] Interface         Sent: 03/20/2011  12:04 AM           To: Denny Levy, MD

## 2011-03-20 NOTE — Telephone Encounter (Signed)
Called pt about Hgb 6.7. Pt is anemic at baseline from menorrhagia. Not taking iron currently. Last bleed x 2 days two days ago. Not currently bleeding.  Symptoms: fatigue, shortness of breath, palpitations and occassionally feeling flush x 1 year. Not acutely worse.  Denies: fainting/pre syncope, chest pain, visual changes.   A: symptomatic anemia from menorrhagia. Not currently bleeding. discuu P: -iron 325 mg TID with meals. Increase water intake and take stool softener.  -medroxyprogesterone acetate 20 mg PO daily x 10 days. Pt has refill from June that she can pick up. Instructed to start taking if her bleeding returns.  -f/u on Wednesday Dec 26th with Dr. Delbert Harness for H/H recheck.  -reviewed red flags to prompt pt to come to ED: worsening symptoms, heaving bleeding.   Precepted with Dr.Johnson, [facility] fellow.

## 2011-03-26 ENCOUNTER — Encounter: Payer: Self-pay | Admitting: Family Medicine

## 2011-03-26 ENCOUNTER — Ambulatory Visit (INDEPENDENT_AMBULATORY_CARE_PROVIDER_SITE_OTHER): Payer: 59 | Admitting: Family Medicine

## 2011-03-26 VITALS — BP 166/94 | HR 77 | Ht 64.75 in | Wt 236.2 lb

## 2011-03-26 DIAGNOSIS — F329 Major depressive disorder, single episode, unspecified: Secondary | ICD-10-CM

## 2011-03-26 DIAGNOSIS — N920 Excessive and frequent menstruation with regular cycle: Secondary | ICD-10-CM

## 2011-03-26 DIAGNOSIS — I10 Essential (primary) hypertension: Secondary | ICD-10-CM

## 2011-03-26 DIAGNOSIS — D649 Anemia, unspecified: Secondary | ICD-10-CM

## 2011-03-26 DIAGNOSIS — E119 Type 2 diabetes mellitus without complications: Secondary | ICD-10-CM

## 2011-03-26 DIAGNOSIS — F3289 Other specified depressive episodes: Secondary | ICD-10-CM

## 2011-03-26 LAB — POCT GLYCOSYLATED HEMOGLOBIN (HGB A1C): Hemoglobin A1C: 6.3

## 2011-03-26 LAB — POCT HEMOGLOBIN: Hemoglobin: 7

## 2011-03-26 MED ORDER — MELOXICAM 15 MG PO TABS: 15.0000 mg | ORAL_TABLET | Freq: Every day | ORAL | Status: AC

## 2011-03-26 MED ORDER — FLUOXETINE HCL 40 MG PO CAPS: 40.0000 mg | ORAL_CAPSULE | Freq: Every day | ORAL | Status: DC

## 2011-03-26 MED ORDER — LISINOPRIL-HYDROCHLOROTHIAZIDE 10-12.5 MG PO TABS: 2.0000 | ORAL_TABLET | Freq: Every day | ORAL | Status: DC

## 2011-03-26 NOTE — Patient Instructions (Signed)
Come back in 10-14 days for fasting bloodwork and visit with Dr. Armen Pickup  Increase fluoxetine from 20 mg to 40 mg  Increase Lisinopril-HCTZ from 1 tablets a day to two tablets per day  Strongly consider therapy- See info for Dr. Pascal Lux.  Think about nutrition referral- would be glad to set at you up at any time that is right for you

## 2011-03-26 NOTE — Progress Notes (Signed)
  Subjective:    Patient ID: Julia Dunn, female    DOB: 04-05-1970, 40 y.o.   MRN: [id number]  HPI Here for routine follow-up- states she needed to be seen before the end of the year and PCP nto available.  DIABETES  Taking and tolerating: yes  Fasting blood sugars:no  Hypoglycemic symptoms: no Visual problems: no Monitoring feet: yes Numbness/Tingling: no Last eye exam: Dec 2012: states has recheck in 1 month due to possible abnormal finding Diabetic Labs:  Lab Results  Component Value Date   HGBA1C 6.2 09/27/2010   HGBA1C 6.5 03/29/2010   HGBA1C 8.6 02/09/2009   Lab Results  Component Value Date   LDLCALC 36 03/29/2010   CREATININE 0.89 09/27/2010   Last microalbumin: No results found for this basename: MICROALBUR, [id number]   HYPERTENSION  BP Readings from Last 3 Encounters:  03/26/11 166/94  09/27/10 161/97  03/29/10 155/94    Hypertension ROS: taking medications as instructed, no medication side effects noted, patient does not perform home BP monitoring, no TIA's, no chest pain on exertion, no dyspnea on exertion, no swelling of ankles and no intermittent claudication symptoms.   Takes meds 5 times per week.  Depression:  States has been having more stress, so she restarted fluoxetine several weeks ago.  Feels it has helped but continues to be tearful.  Denies SI, HI, hallucinations.  No history of mental health admissions per patient.  Menorrhagia:  Continued heavy bleeding.  Feels tired, no chest pain.  Did not pick up iron supplements.  Has appt with OBGYn to discuss endometrial ablation/hysterectomy 04/08/11       Review of Systems     Objective:   Physical Exam        Assessment & Plan:

## 2011-03-26 NOTE — Assessment & Plan Note (Signed)
Continues to be at goal.  Will consider nutrition referral once life gets more settled.

## 2011-03-26 NOTE — Assessment & Plan Note (Signed)
hemoglobin 7.0, slightly improved from previous check.  Has appointment with OBGYN for surgical management.  Encouraged her to pick up iron tablets that were previously prescribed.

## 2011-03-26 NOTE — Assessment & Plan Note (Signed)
Will double lisinopril and return for BP/lab recheck in 10-14 days

## 2011-03-26 NOTE — Assessment & Plan Note (Signed)
Will increase fluoxetine to 40 mg, she has been proactive and know there are at least 2 providers on her health plan.  Also gave her info for Dr. Pascal Lux.  She will schedule counseling appt.  Given red flags for emergent care.  Will follow-up in 2 weeks

## 2011-04-24 ENCOUNTER — Other Ambulatory Visit: Payer: Self-pay | Admitting: Family Medicine

## 2011-04-25 ENCOUNTER — Other Ambulatory Visit: Payer: Self-pay | Admitting: Family Medicine

## 2011-04-25 NOTE — Telephone Encounter (Signed)
Refill request

## 2011-06-26 ENCOUNTER — Telehealth: Payer: Self-pay | Admitting: Family Medicine

## 2011-06-26 NOTE — Telephone Encounter (Signed)
Called patient she is due for lipid checks and I would like to discuss her diabetes since I received a letter from her insurance provider, [facility]. She is also trying to make an appt to f/u metrorrhagia. She will call for an appt.

## 2011-07-28 ENCOUNTER — Telehealth: Payer: Self-pay | Admitting: Obstetrics and Gynecology

## 2011-07-28 NOTE — Telephone Encounter (Signed)
Pt has appt on 2nd

## 2011-08-01 ENCOUNTER — Ambulatory Visit (INDEPENDENT_AMBULATORY_CARE_PROVIDER_SITE_OTHER): Payer: 59 | Admitting: Obstetrics and Gynecology

## 2011-08-01 ENCOUNTER — Encounter: Payer: Self-pay | Admitting: Obstetrics and Gynecology

## 2011-08-01 VITALS — BP 114/70 | Temp 98.4°F | Ht 65.0 in | Wt 234.0 lb

## 2011-08-01 DIAGNOSIS — Z1321 Encounter for screening for nutritional disorder: Secondary | ICD-10-CM

## 2011-08-01 DIAGNOSIS — A499 Bacterial infection, unspecified: Secondary | ICD-10-CM

## 2011-08-01 DIAGNOSIS — Z113 Encounter for screening for infections with a predominantly sexual mode of transmission: Secondary | ICD-10-CM

## 2011-08-01 DIAGNOSIS — N760 Acute vaginitis: Secondary | ICD-10-CM

## 2011-08-01 DIAGNOSIS — Z01419 Encounter for gynecological examination (general) (routine) without abnormal findings: Secondary | ICD-10-CM

## 2011-08-01 DIAGNOSIS — N920 Excessive and frequent menstruation with regular cycle: Secondary | ICD-10-CM

## 2011-08-01 DIAGNOSIS — R109 Unspecified abdominal pain: Secondary | ICD-10-CM

## 2011-08-01 DIAGNOSIS — N898 Other specified noninflammatory disorders of vagina: Secondary | ICD-10-CM

## 2011-08-01 DIAGNOSIS — Z130 Encounter for screening for diseases of the blood and blood-forming organs and certain disorders involving the immune mechanism: Secondary | ICD-10-CM

## 2011-08-01 DIAGNOSIS — Z124 Encounter for screening for malignant neoplasm of cervix: Secondary | ICD-10-CM

## 2011-08-01 LAB — CBC
Platelets: 266 10*3/uL (ref 150–400)
RBC: 4.08 MIL/uL (ref 3.87–5.11)
RDW: 16.7 % — ABNORMAL HIGH (ref 11.5–15.5)
WBC: 5.6 10*3/uL (ref 4.0–10.5)

## 2011-08-01 LAB — POCT URINALYSIS DIPSTICK
Bilirubin, UA: NEGATIVE
Glucose, UA: NEGATIVE
Ketones, UA: NEGATIVE
Leukocytes, UA: NEGATIVE
Nitrite, UA: NEGATIVE

## 2011-08-01 LAB — POCT URINE PREGNANCY: Preg Test, Ur: NEGATIVE

## 2011-08-01 LAB — TSH: TSH: 0.971 u[IU]/mL (ref 0.350–4.500)

## 2011-08-01 MED ORDER — FLUCONAZOLE 150 MG PO TABS: 150.0000 mg | ORAL_TABLET | Freq: Once | ORAL | Status: AC

## 2011-08-01 MED ORDER — AMBULATORY NON FORMULARY MEDICATION: 600.0000 | Status: DC

## 2011-08-01 MED ORDER — METRONIDAZOLE 500 MG PO TABS: 500.0000 mg | ORAL_TABLET | Freq: Two times a day (BID) | ORAL | Status: AC

## 2011-08-01 NOTE — Progress Notes (Signed)
Subjective:    Julia Dunn is a 41 y.o. female, [phone], who presents for an annual exam. Patient c/o vaginal discharge for 1 year without any relief from OTC treatment. Patient also has throbbing/crampy pain prior to menses x 1 day with heavy bleeding requires her to change every 2 hours.  Has soiled 3 chairs at work, [employer] account planner. May last 10 days-3 months.  Seen by PCP and given Provera 20 mg daily and iron.  Denies dizziness, chest pain, headache, vision changes but admits to some shortness of breath with walking stairs.   Menstrual cycle:   LMP: Patient's last menstrual period was 07/08/2011.           Cycle: very heavy, see HPI  The following portions of the patient's history were reviewed and updated as appropriate: allergies, current medications, past family history, past medical history, past social history, past surgical history and problem list.  Review of Systems Pertinent items are noted in HPI. Breast:Negative for breast lump,nipple discharge or nipple retraction Gastrointestinal: Negative for abdominal pain, change in bowel habits or rectal bleeding Urinary:negative   Objective:    BP 114/70  Temp 98.4 F (36.9 C)  Ht 5\' 5"  (1.651 m)  Wt 234 lb (106.142 kg)  BMI 38.94 kg/m2  LMP 07/08/2011    Weight:  Wt Readings from Last 1 Encounters:  08/01/11 234 lb (106.142 kg)          BMI: Body mass index is 38.94 kg/(m^2).  General Appearance: Alert, appropriate appearance for age. No acute distress HEENT: Grossly normal Neck / Thyroid: Supple, no masses, nodes or enlargement Lungs: clear to auscultation bilaterally Back: No CVA tenderness Breast Exam: No masses or nodes.No dimpling, nipple retraction or discharge. Cardiovascular: Regular rate and rhythm. S1, S2, no murmur Gastrointestinal: Soft, non-tender, no masses or organomegaly Pelvic Exam: External genitalia: normal general appearance Vaginal: normal mucosa without prolapse or lesions Cervix: normal  appearance irregular, right fundus up to umbilicus, overall 14-16 weeks size and mobile Rectovaginal: not indicated Lymphatic Exam: Non-palpable nodes in neck, clavicular, axillary, or inguinal regions Skin: no rash or abnormalities Neurologic: Normal gait and speech, no tremor  Psychiatric: Alert and oriented, appropriate affect.   Wet Prep:pH 5.5, whiff +, clue cells + Urinalysis: negative UPT: negative   Assessment:    Routine GYN Exam  Enlarged Uterus-probable fibroids Bacterial Vaginosis Menorrhagia H/O Severe anemia (hgb 6.4 per pt.)   Plan:  Pelvic U/S to evaluate enlarged uterus & menorrhagia  PAP done  STD screening- pending  TSH, Vitamin D 25-H, CBC-pending  Reviewed endometrial biopsy  Reviewed medical and surgical management options for menorrhagia and fibroids.  Patient interested in ablation  Boric Acid Capsules 600 mg #30 1 pv twice weekly 11 rf Metronidazole 500 mg #14 bid x 7d; etoh precautions Diflucan 150 mg #1 1 po stat 2 refills   RTO- ultrasound  Dari Carpenito, PA-C

## 2011-08-01 NOTE — Progress Notes (Signed)
Regular Periods: no Mammogram: no  Monthly Breast Ex.: yes Exercise: yes  Tetanus < 10 years: no Seatbelts: yes  NI. Bladder Functn.: yes Abuse at home: no  Daily BM's: yes Stressful Work: yes  Healthy Diet: yes Sigmoid-Colonoscopy: NO  Calcium: no Medical problems this year: VAGINAL DISCHARGE   LAST PAP2011 NL Contraception: BTL Mammogram: NEVER TIME FOR MAMMO  PCP: [facility] PMH:NO CHANGE FMH:NO CHANGE Last Bone Scan: N/A   PT IS SINGLE

## 2011-08-01 NOTE — Progress Notes (Signed)
Color: YELLOW Odor: yes  Itching:yes Thin:yes Thick:yes Fever:no Dyspareunia:no Hx PID:no HX STD:yes  TRICH Pelvic Pain:yes Desires Gc/CT:yes Desires HIV,RPR,HbsAG:yes  HIV

## 2011-08-01 NOTE — Patient Instructions (Addendum)
Give patient a note for work and indicating when her next appointment is scheduled to be  Give patient information on fibroids and endometrial ablation

## 2011-08-02 LAB — VITAMIN D 25 HYDROXY (VIT D DEFICIENCY, FRACTURES): Vit D, 25-Hydroxy: 20 ng/mL — ABNORMAL LOW (ref 30–89)

## 2011-08-04 ENCOUNTER — Other Ambulatory Visit: Payer: Self-pay

## 2011-08-04 ENCOUNTER — Telehealth: Payer: Self-pay

## 2011-08-04 NOTE — Telephone Encounter (Signed)
TC TO PT REGARDING VIT D LEVEL. INFORMED PT THAT VIT D IS LOW  AND I NEED TO CALL IN RX FOR PT . WENT OVER VIT D PROTOCOL AND

## 2011-08-04 NOTE — Telephone Encounter (Signed)
CONTINUE FROM PREVIOUS MESSAGE.  TOLD PT THAT I WILL CALL RX TO PT PHARMACY. PT VOICED UNDERSTANDING

## 2011-08-04 NOTE — Telephone Encounter (Signed)
Message copied by Winfred Leeds on Mon Aug 04, 2011  2:52 PM ------      Message from: Henreitta Leber      Created: Sun Aug 03, 2011  8:48 PM       Bonita Quin,  Please review the Vitamin D protocol with this patient and encourage her to continue her iron tablets twice daily with stool softeners (docusate sodium 100 mg).  Let her know that it the iron is causing her problems, we call explore alternative forms.  Thanks, EP

## 2011-08-04 NOTE — Telephone Encounter (Signed)
LM FOR PT TO CALL BACK. 

## 2011-08-05 LAB — PAP IG, CT-NG, RFX HPV ASCU: Chlamydia Probe Amp: NEGATIVE

## 2011-08-08 ENCOUNTER — Other Ambulatory Visit: Payer: Self-pay | Admitting: Obstetrics and Gynecology

## 2011-08-08 ENCOUNTER — Ambulatory Visit (INDEPENDENT_AMBULATORY_CARE_PROVIDER_SITE_OTHER): Payer: 59 | Admitting: Obstetrics and Gynecology

## 2011-08-08 ENCOUNTER — Ambulatory Visit (INDEPENDENT_AMBULATORY_CARE_PROVIDER_SITE_OTHER): Payer: 59

## 2011-08-08 ENCOUNTER — Encounter: Payer: Self-pay | Admitting: Obstetrics and Gynecology

## 2011-08-08 VITALS — BP 128/80 | Temp 98.8°F | Ht 65.0 in | Wt 231.0 lb

## 2011-08-08 DIAGNOSIS — D250 Submucous leiomyoma of uterus: Secondary | ICD-10-CM

## 2011-08-08 DIAGNOSIS — N920 Excessive and frequent menstruation with regular cycle: Secondary | ICD-10-CM

## 2011-08-08 MED ORDER — MEDROXYPROGESTERONE ACETATE 10 MG PO TABS: ORAL_TABLET | ORAL | Status: DC

## 2011-08-08 MED ORDER — OXYCODONE-ACETAMINOPHEN 5-325 MG PO TABS: 1.0000 | ORAL_TABLET | Freq: Four times a day (QID) | ORAL | Status: DC | PRN

## 2011-08-08 NOTE — Patient Instructions (Signed)
Call if bleeding does not decrease or if symptoms worsen.

## 2011-08-08 NOTE — Progress Notes (Signed)
Patient being followed for severe menorrhagia returns for ultrasound follow up. Patient also was noted, on recent labs to have Vitamin D deficiency, anemia (hgb 9.3) but normal TSH, negative GC/CT and pregnancy test.  Is taking Vitamin D and iron.  Patient continues to bleed in spite of Provera 20 mg given by PCP.  Has bee taking Ibuprofen however, it is not managing her cramping, requests a stronger analgesic.  O:  U/S uteurus- 13.9 x 10.2 x 8.57 cm, endometrium- 1.38 cm, # 3 fibroids  < 3.5 cm with one having a submucosal component.  both ovaries appear normal.  A: Menorrhagia      Anemia      Fibroids with submucosal component      Vitamin D Deficiency      Pelvic Pain  P: Percocet 5/325 #30 1 po q 4 hours       Wants to schedule in June for [facility] with ablation       with first available M.D.       Lysteda 650 mg #30 2 tid x 5 days       If unable to afford Lysteda begin Provera 10 mg #40 1 po      q 6 hours until bleeding stops then 2 po daily x 14 days.      Call if bleeding does not improve or if symptoms worsen

## 2011-08-13 ENCOUNTER — Ambulatory Visit (INDEPENDENT_AMBULATORY_CARE_PROVIDER_SITE_OTHER): Payer: 59 | Admitting: Family Medicine

## 2011-08-13 DIAGNOSIS — E559 Vitamin D deficiency, unspecified: Secondary | ICD-10-CM | POA: Insufficient documentation

## 2011-08-13 DIAGNOSIS — E119 Type 2 diabetes mellitus without complications: Secondary | ICD-10-CM

## 2011-08-13 NOTE — Progress Notes (Signed)
Subjective:     Patient ID: Julia Dunn, female   DOB: April 24, 1970, 41 y.o.   MRN: [id number]  HPI Patient [facility]  Review of Systems     Objective:   Physical Exam     Assessment:

## 2011-08-15 ENCOUNTER — Telehealth: Payer: Self-pay | Admitting: Obstetrics and Gynecology

## 2011-09-01 ENCOUNTER — Encounter: Payer: 59 | Admitting: Obstetrics and Gynecology

## 2011-09-01 ENCOUNTER — Telehealth: Payer: Self-pay | Admitting: Obstetrics and Gynecology

## 2011-09-01 NOTE — Telephone Encounter (Signed)
This is EP Pt

## 2011-09-03 NOTE — Telephone Encounter (Signed)
PT CALLED, STATES WANTS APPT FOR CONSULTATION, HAD SPOKEN W/ EP ABOUT POSSIBLE ABLATION BUT THINKS SHE MAY NEED A HYSTERECTOMY, IS ALWAYS BLDG, HAD IC AND NOTICED A POOL OF BLOOD IN BED AFTER, ONCE BLDG SLOWS DOWN, IT STARTS BACK UP AGAIN.  PT SCHED EVAL AND DISCUSS OPTIONS TMRW 09/05/11 @ 0930 W/ AVS.

## 2011-09-03 NOTE — Telephone Encounter (Signed)
TC to pt. LM to return call regarding message. 

## 2011-09-05 ENCOUNTER — Ambulatory Visit (INDEPENDENT_AMBULATORY_CARE_PROVIDER_SITE_OTHER): Payer: 59 | Admitting: Obstetrics and Gynecology

## 2011-09-05 ENCOUNTER — Encounter: Payer: Self-pay | Admitting: Obstetrics and Gynecology

## 2011-09-05 VITALS — BP 124/78 | Temp 98.7°F | Resp 16 | Ht 65.0 in | Wt 236.0 lb

## 2011-09-05 DIAGNOSIS — E669 Obesity, unspecified: Secondary | ICD-10-CM

## 2011-09-05 DIAGNOSIS — D649 Anemia, unspecified: Secondary | ICD-10-CM

## 2011-09-05 DIAGNOSIS — D219 Benign neoplasm of connective and other soft tissue, unspecified: Secondary | ICD-10-CM

## 2011-09-05 DIAGNOSIS — R32 Unspecified urinary incontinence: Secondary | ICD-10-CM

## 2011-09-05 DIAGNOSIS — D259 Leiomyoma of uterus, unspecified: Secondary | ICD-10-CM

## 2011-09-05 DIAGNOSIS — N920 Excessive and frequent menstruation with regular cycle: Secondary | ICD-10-CM

## 2011-09-05 HISTORY — DX: Benign neoplasm of connective and other soft tissue, unspecified: D21.9

## 2011-09-05 NOTE — Progress Notes (Signed)
Vag. Discharge:yes Odor:yes Fever:no Irreg.Periods:yes Dyspareunia:yes Dysuria:no Frequency:yes Urgency:yes Hematuria:no Per Pt she cannot tell since she is always bleeding Kidney stones:no Constipation:no Diarrhea:no Rectal Bleeding: no Vomiting:no Nausea:yes Pregnant:no Fibroids:yes Endometriosis:no Hx of Ovarian Cyst:no Hx IUD:no Hx STD-PID:yes Appendectomy:no Gall Bladder Dz:no  When did bleeding start: 5-7 years ago How  Long: 5-7 years How often changing pad/tampon: per pt it varies Bleeding Disorders: no Contraception: no Fibroids: yes Hormone Therapy: no New Medications: no Menopausal Symptoms: no Pt is unsure  Vag. Discharge: yes Abdominal Pain: yes Increased Stress: yes  HISTORY OF PRESENT ILLNESS  [name] is a 41 y.o. year old female,[id number], who presents for a problem visit. She has a known history of fibroids, menorrhagia, and anemia (hemoglobin 9.3).  Subjective:  The patient reports that she is fatigued and is now ready to proceed with definitive therapy.  She does complain of stress urinary incontinence.  Objective:  BP 124/78  Temp(Src) 98.7 F (37.1 C) (Oral)  Resp 16  Ht 5\' 5"  (1.651 m)  Wt 236 lb (107.049 kg)  BMI 39.27 kg/m2  LMP 08/06/2011   General: alert, cooperative and mild distress Resp: clear to auscultation bilaterally Cardio: regular rate and rhythm, S1, S2 normal, no murmur, click, rub or gallop GI: soft, non-tender; bowel sounds normal; no masses,  no organomegaly  External genitalia: normal general appearance Vaginal: normal without tenderness, induration or masses and loss of urethrovesical angle Cervix: normal appearance Adnexa: normal bimanual exam Uterus: 10-12 weeks size, irregular, firm (difficult to evaluate due to obesity abdomen)  ENDOMETRIAL BIOPSY PROCEDURE  The indications for the procedure were reviewed with the patient.  All her questions were answered.  A speculum exam was performed.  The  cervix and vagina were prepped with multiple layers of Betadine.  Hurricaine gel was applied to the cervix.  A single-tooth tenaculum was not required.  The uterus sounded to 9 centimeters.  An adequate sample was obtained.  The patient tolerated her procedure well.  The endometrial sample was sent to pathology.   Assessment:  Menorrhagia Fibroids Anemia Obesity Fatigue Depression Stress urinary incontinence  Plan:  Endometrial biopsy to pathology. Management options discussed.  Risk and benefits outlined.  The patient wants to proceed with hysterectomy.  She will call us when she is ready for [place] to schedule her procedure. TVT for urinary incontinence briefly discussed.  The patient was given recommendations about the CDC guidelines for mesh erosion.  Return to office in 2 week(s).   Leonard Schwartz M.D.  09/05/2011 3:18 PM

## 2011-09-06 LAB — GC/CHLAMYDIA PROBE AMP, GENITAL: Chlamydia, DNA Probe: NEGATIVE

## 2011-09-09 LAB — PATHOLOGY

## 2011-09-30 ENCOUNTER — Encounter: Payer: Self-pay | Admitting: Obstetrics and Gynecology

## 2011-10-01 ENCOUNTER — Telehealth: Payer: Self-pay | Admitting: Obstetrics and Gynecology

## 2012-04-23 ENCOUNTER — Encounter: Payer: Self-pay | Admitting: Family Medicine

## 2012-04-23 ENCOUNTER — Ambulatory Visit (INDEPENDENT_AMBULATORY_CARE_PROVIDER_SITE_OTHER): Payer: 59 | Admitting: Family Medicine

## 2012-04-23 VITALS — BP 168/108 | HR 96 | Temp 98.2°F | Wt 231.7 lb

## 2012-04-23 DIAGNOSIS — I10 Essential (primary) hypertension: Secondary | ICD-10-CM

## 2012-04-23 DIAGNOSIS — R05 Cough: Secondary | ICD-10-CM

## 2012-04-23 DIAGNOSIS — J301 Allergic rhinitis due to pollen: Secondary | ICD-10-CM

## 2012-04-23 MED ORDER — ACETAMINOPHEN-CODEINE #3 300-30 MG PO TABS: 1.0000 | ORAL_TABLET | ORAL | Status: DC | PRN

## 2012-04-23 MED ORDER — BENZONATATE 100 MG PO CAPS: 100.0000 mg | ORAL_CAPSULE | Freq: Three times a day (TID) | ORAL | Status: DC | PRN

## 2012-04-23 MED ORDER — CETIRIZINE HCL 10 MG PO TABS: 10.0000 mg | ORAL_TABLET | Freq: Every day | ORAL | Status: DC

## 2012-04-23 NOTE — Assessment & Plan Note (Signed)
Will add Zyrtec to cough regimen in case this is chronic cough due to allergic rhinitis.

## 2012-04-23 NOTE — Progress Notes (Signed)
  Subjective:    Patient ID: Julia Dunn, female    DOB: 22-Jan-1971, 42 y.o.   MRN: [id number]  HPI  Patient presents to same day clinic for cough.  Coughing has been present x 4 weeks.  Cough is productive, phlegm is clear.  Associated symptoms: dyspnea with exertion, headaches, head congestion.  She has had difficulty walking up and down stairs, which has been getting worse while she has been coughing.  She is embarrassed at work because she is always coughing.  She has tried over the counter cough drops and cough syrup without relief.  Endorses subjective fevers at home, but afebrile in clinic today.    Denies any association with food or indigestion.  Never smoker.  Lisinopril is on medication list, but she does not take them everyday because medications make her sleepy so she has stopped most of her medications.  She will discuss this with her PCP in the near future.  Review of Systems  Per HPI    Objective:   Physical Exam  Constitutional: She appears well-nourished. No distress.       Tearful  HENT:  Head: Normocephalic and atraumatic.  Right Ear: External ear normal.  Left Ear: External ear normal.  Mouth/Throat: Oropharynx is clear and moist.  Neck: Neck supple.  Cardiovascular: Normal rate and normal heart sounds.   No murmur heard. Pulmonary/Chest: Effort normal and breath sounds normal. No respiratory distress. She has no wheezes.  Abdominal: Soft. She exhibits no distension. There is no tenderness. There is no rebound.  Psychiatric: Her mood appears anxious.       Assessment & Plan:

## 2012-04-23 NOTE — Patient Instructions (Addendum)
It was good to see you today, Julia Dunn.  I am sorry you are not feeling well. Please pick up prescriptions at your pharmacy. Please refer to instructions below regarding the common cold. If you develop worsening symptoms, fever (temp > 101 degrees), persistent cough, or nausea/vomiting, please return to clinic. Hope you feel better soon.  Upper Respiratory Infection, Adult  An upper respiratory infection (URI) is also known as the common cold. It is often caused by a type of germ (virus). Colds are easily spread (contagious). You can pass it to others by kissing, coughing, sneezing, or drinking out of the same glass. Usually, you get better in 1 or 2 weeks.  HOME CARE  Only take medicine as told by your doctor.  Use a warm mist humidifier or breathe in steam from a hot shower.  Drink enough water and fluids to keep your pee (urine) clear or pale yellow.  Get plenty of rest.  Return to work when your temperature is back to normal or as told by your doctor. You may use a face mask and wash your hands to stop your cold from spreading. GET HELP RIGHT AWAY IF:  After the first few days, you feel you are getting worse.  You have questions about your medicine.  You have chills, shortness of breath, or brown or red spit (mucus).  You have yellow or brown snot (nasal discharge) or pain in the face, especially when you bend forward.  You have a fever, puffy (swollen) neck, pain when you swallow, or white spots in the back of your throat.  You have a bad headache, ear pain, sinus pain, or chest pain.  You have a high-pitched whistling sound when you breathe in and out (wheezing).  You have a lasting cough or cough up blood.  You have sore muscles or a stiff neck. MAKE SURE YOU:  Understand these instructions.  Will watch your condition.  Will get help right away if you are not doing well or get worse. Document Released: 09/03/2007 Document Revised: 06/09/2011 Document Reviewed: 07/22/2010  [facility]  Patient Information 2013 [place], [place].

## 2012-04-23 NOTE — Assessment & Plan Note (Signed)
BP elevated today.  She has not taken any BP medications today and only takes Lisinopril-HCTZ 2-3 times per week.   - Advised patient to avoid NSAID while her BP is elevated - Follow up with PCP in 2-4 weeks for chronic medical issues - Advised patient to take BP medication today

## 2012-04-23 NOTE — Assessment & Plan Note (Signed)
Likely secondary to post-viral cough vs. Allergic rhinitis vs. GERD.  Treat symptomatically with Tessalon Perles and Tylenol #3 at bedtime.  Handout given.  Follow up with PCP if symptoms do not improve in the next 10 days.  May consider D/C Lisinopril at that time.

## 2012-05-28 ENCOUNTER — Telehealth: Payer: Self-pay | Admitting: Family Medicine

## 2012-05-28 NOTE — Telephone Encounter (Signed)
Is calling about paperwork that was dropped off to Dr Ashley Royalty about her daughter, Julia Dunn.  She needs this paperwork for her job so she can take care of her daughter.  pls advise

## 2012-07-15 ENCOUNTER — Telehealth: Payer: Self-pay | Admitting: Family Medicine

## 2012-07-15 ENCOUNTER — Encounter: Payer: Self-pay | Admitting: Family Medicine

## 2012-07-15 ENCOUNTER — Ambulatory Visit (INDEPENDENT_AMBULATORY_CARE_PROVIDER_SITE_OTHER): Payer: 59 | Admitting: Family Medicine

## 2012-07-15 VITALS — BP 152/83 | HR 102 | Wt 225.0 lb

## 2012-07-15 DIAGNOSIS — R300 Dysuria: Secondary | ICD-10-CM | POA: Insufficient documentation

## 2012-07-15 DIAGNOSIS — F329 Major depressive disorder, single episode, unspecified: Secondary | ICD-10-CM

## 2012-07-15 DIAGNOSIS — R0600 Dyspnea, unspecified: Secondary | ICD-10-CM

## 2012-07-15 DIAGNOSIS — R0989 Other specified symptoms and signs involving the circulatory and respiratory systems: Secondary | ICD-10-CM

## 2012-07-15 DIAGNOSIS — I10 Essential (primary) hypertension: Secondary | ICD-10-CM

## 2012-07-15 DIAGNOSIS — E119 Type 2 diabetes mellitus without complications: Secondary | ICD-10-CM

## 2012-07-15 DIAGNOSIS — D649 Anemia, unspecified: Secondary | ICD-10-CM

## 2012-07-15 LAB — POCT URINALYSIS DIPSTICK
Bilirubin, UA: NEGATIVE
Blood, UA: NEGATIVE
Nitrite, UA: NEGATIVE
pH, UA: 5.5

## 2012-07-15 LAB — CBC
MCH: 16.3 pg — ABNORMAL LOW (ref 26.0–34.0)
MCV: 62.9 fL — ABNORMAL LOW (ref 78.0–100.0)
Platelets: 593 10*3/uL — ABNORMAL HIGH (ref 150–400)
RBC: 3.13 MIL/uL — ABNORMAL LOW (ref 3.87–5.11)
RDW: 27.6 % — ABNORMAL HIGH (ref 11.5–15.5)

## 2012-07-15 LAB — LIPID PANEL
Cholesterol: 84 mg/dL (ref 0–200)
HDL: 37 mg/dL — ABNORMAL LOW (ref >39)
Total CHOL/HDL Ratio: 2.3 Ratio
VLDL: 13 mg/dL (ref 0–40)

## 2012-07-15 LAB — COMPREHENSIVE METABOLIC PANEL
ALT: <8 U/L (ref 0–35)
AST: 12 U/L (ref 0–37)
BUN: 7 mg/dL (ref 6–23)
Calcium: 9.2 mg/dL (ref 8.4–10.5)
Chloride: 106 mEq/L (ref 96–112)
Creat: 0.79 mg/dL (ref 0.50–1.10)
Total Bilirubin: 0.7 mg/dL (ref 0.3–1.2)

## 2012-07-15 LAB — POCT GLYCOSYLATED HEMOGLOBIN (HGB A1C): Hemoglobin A1C: 5.7

## 2012-07-15 MED ORDER — ALBUTEROL SULFATE HFA 108 (90 BASE) MCG/ACT IN AERS: 2.0000 | INHALATION_SPRAY | Freq: Four times a day (QID) | RESPIRATORY_TRACT | Status: DC | PRN

## 2012-07-15 MED ORDER — LISINOPRIL 10 MG PO TABS: 10.0000 mg | ORAL_TABLET | Freq: Every day | ORAL | Status: DC

## 2012-07-15 MED ORDER — DULOXETINE HCL 30 MG PO CPEP: 30.0000 mg | ORAL_CAPSULE | Freq: Every day | ORAL | Status: DC

## 2012-07-15 NOTE — Assessment & Plan Note (Signed)
A: improved. Meds: non-compliant.  P: diet control.

## 2012-07-15 NOTE — Assessment & Plan Note (Signed)
A: suspect anemia given history. Also considered asthma, heart failure and PE. Heart failure of PE less likely given symptoms stable for past 6 months. Low risk for PE.  P:  Check CBC Check BNP Trial of albuterol

## 2012-07-15 NOTE — Telephone Encounter (Signed)
Called patient to remind her not to eat this AM, so fasting labs can be obtained. Water and black coffee ok.

## 2012-07-15 NOTE — Assessment & Plan Note (Signed)
A: above goal. Meds: non compliant P: restart lisinopril f/u Cr in 2-4 weeks

## 2012-07-15 NOTE — Assessment & Plan Note (Signed)
Check CBC today. Likely still anemic and will need aspirin.

## 2012-07-15 NOTE — Progress Notes (Signed)
Subjective:     Patient ID: Julia Dunn, female   DOB: 1970-10-20, 42 y.o.   MRN: [id number]  HPI 42 yo F presents for f/u visit to discuss the following:  1. Dyspnea: x 6 months. Worsening. Associated with SOB with minimal exertion and cough with exertion. No chest pain, presyncope or syncope. Has intermittent L leg swelling. No long trip, recent surgery, personal or family history of DVT. Losing weight. Has history of anemia. Not taking iron.   2. DM2: no meds x 6 months. Losing weight. Excessive thirst. Denies polyuria. 6 mos of intermittent dysuria.   3. HTN: no meds x 6 months. Has headaches, dyspnea and intermittent edema. No CP.   4. Headaches: every other day. Bilateral temple. No associated weakness, numbness, nausea. No treatment. Symptoms relieved with rest.   5. Depression: no SI/HI. No medication. Admits to increased tearfulness and decreased ability to cope with work related stress. No prozac for 6 months. Admits to low back and abdominal pains as well as headaches.   6. Dysuria: x 6 months. Intermittent. Associated with b/l mid-low back pain. No fever, chills, nausea or emesis.   Review of Systems Patient Information Form: Screening and ROS  AUDIT-C Score: 1 Do you feel safe in relationships? yes PHQ-2:positive  Review of Symptoms  General:  Negative for nexplained weight loss, fever Skin: Negative for new or changing mole, sore that won't heal HEENT: Negative for trouble hearing, trouble seeing, ringing in ears, mouth sores, hoarseness, change in voice, dysphagia. CV: Positive for dyspnea. Negative for chest pain,  edema, palpitations Resp: Positive for cough. Negative for hemoptysis GI:  Positive for lower abdominal pain. Negative for nausea, vomiting, diarrhea, constipation, melena, hematochezia. GU: Positive for vaginal discharge.  Negative for dysuria, incontinence, urinary hesitance, hematuria, vaginal or penile discharge, polyuria, sexual difficulty, lumps in  testicle or breasts MSK: positive for low back pain.  Negative for muscle cramps or aches, joint pain or swelling Neuro: Positive for headaches Negative for weakness, numbness, dizziness, passing out/fainting Psych: Negative for depression, anxiety, memory problems Endo: Positive for excessive thirst. Negative for frequent urination.     Objective:   Physical Exam BP 152/83  Pulse 102  Wt 225 lb (102.059 kg)  BMI 37.44 kg/m2  LMP 06/29/2012 General appearance: alert, cooperative, no distress and moderately obese Eyes: conjunctivae/corneas clear. PERRL, EOM's intact.  Back: symmetric, no curvature. ROM normal. No CVA tenderness. Lungs: clear to auscultation bilaterally Heart: regular rate and rhythm, S1, S2 normal, no murmur, click, rub or gallop Abdomen: obese, soft, no masses, no tenderness Extremities: extremities normal, atraumatic, no cyanosis or edema    Assessment and Plan:

## 2012-07-15 NOTE — Assessment & Plan Note (Signed)
A: declined off medication. Depression with aches. P: cymbalta. F/u in 2-4 weeks

## 2012-07-15 NOTE — Patient Instructions (Addendum)
Harlan,  Thank you for coming in today. Great job with diabetes and weight loss!  For HTN: restart lisinopril. Avoid extra salt in your diet.   For SOB: trial of albuterol. Will check CBC. Anemia can cause shortness of breath with movement/exercise as well. If low you will need to restart iron.   For depression with body aches: cymbalta. 30 mg daily. F/u in 2-3 weeks. If you tolerate this well we may increase to 60 mg after 6 weeks.   Dr. Armen Pickup

## 2012-07-15 NOTE — Assessment & Plan Note (Signed)
A: dysuria with back pain. UA negative for infection, no blood to suggest stone.  P: Oral hydration.  No antibiotics

## 2012-07-15 NOTE — Telephone Encounter (Signed)
I received a call from [facility] labs alerting me to a critical hemoglobin level of 5.1 g/dL. Therefore, I alerted the patient of the critical lab value. She states that that she is not currently lightheaded or weak, but does get short of breath very easily. I recommended that that she come to [facility] for evaluation and treatment, which she is agreeable with.

## 2012-07-16 LAB — BRAIN NATRIURETIC PEPTIDE: Brain Natriuretic Peptide: 68.1 pg/mL (ref 0.0–100.0)

## 2012-07-18 ENCOUNTER — Telehealth: Payer: Self-pay | Admitting: Family Medicine

## 2012-07-18 NOTE — Telephone Encounter (Signed)
Dangerously low Hgb and symptomatic. Called patient, left VM requesting that she present to ED for Hgb recheck and blood transfusion.

## 2012-07-19 ENCOUNTER — Encounter: Payer: Self-pay | Admitting: *Deleted

## 2012-07-20 ENCOUNTER — Inpatient Hospital Stay (HOSPITAL_COMMUNITY)
Admission: AD | Admit: 2012-07-20 | Discharge: 2012-07-21 | DRG: 812 | Disposition: A | Discharge status: Home / Self Care | Payer: 59 | Source: Ambulatory Visit | Attending: Family Medicine | Admitting: Family Medicine

## 2012-07-20 ENCOUNTER — Telehealth: Payer: Self-pay | Admitting: Family Medicine

## 2012-07-20 ENCOUNTER — Encounter (HOSPITAL_COMMUNITY): Payer: Self-pay | Admitting: Neurology

## 2012-07-20 ENCOUNTER — Other Ambulatory Visit: Payer: Self-pay | Admitting: Family Medicine

## 2012-07-20 DIAGNOSIS — R0989 Other specified symptoms and signs involving the circulatory and respiratory systems: Secondary | ICD-10-CM

## 2012-07-20 DIAGNOSIS — R300 Dysuria: Secondary | ICD-10-CM | POA: Diagnosis present

## 2012-07-20 DIAGNOSIS — D500 Iron deficiency anemia secondary to blood loss (chronic): Principal | ICD-10-CM | POA: Diagnosis present

## 2012-07-20 DIAGNOSIS — E119 Type 2 diabetes mellitus without complications: Secondary | ICD-10-CM | POA: Diagnosis present

## 2012-07-20 DIAGNOSIS — R0600 Dyspnea, unspecified: Secondary | ICD-10-CM

## 2012-07-20 DIAGNOSIS — Z9119 Patient's noncompliance with other medical treatment and regimen: Secondary | ICD-10-CM

## 2012-07-20 DIAGNOSIS — D259 Leiomyoma of uterus, unspecified: Secondary | ICD-10-CM | POA: Diagnosis present

## 2012-07-20 DIAGNOSIS — Z91199 Patient's noncompliance with other medical treatment and regimen due to unspecified reason: Secondary | ICD-10-CM

## 2012-07-20 DIAGNOSIS — N898 Other specified noninflammatory disorders of vagina: Secondary | ICD-10-CM | POA: Diagnosis present

## 2012-07-20 DIAGNOSIS — I10 Essential (primary) hypertension: Secondary | ICD-10-CM | POA: Diagnosis present

## 2012-07-20 DIAGNOSIS — F329 Major depressive disorder, single episode, unspecified: Secondary | ICD-10-CM

## 2012-07-20 DIAGNOSIS — J45909 Unspecified asthma, uncomplicated: Secondary | ICD-10-CM | POA: Diagnosis present

## 2012-07-20 DIAGNOSIS — D649 Anemia, unspecified: Secondary | ICD-10-CM | POA: Diagnosis present

## 2012-07-20 DIAGNOSIS — F3289 Other specified depressive episodes: Secondary | ICD-10-CM | POA: Diagnosis present

## 2012-07-20 DIAGNOSIS — N920 Excessive and frequent menstruation with regular cycle: Secondary | ICD-10-CM | POA: Diagnosis present

## 2012-07-20 LAB — CBC
HCT: 17.9 % — ABNORMAL LOW (ref 36.0–46.0)
Hemoglobin: 4.7 g/dL — CL (ref 12.0–15.0)
MCH: 16.8 pg — ABNORMAL LOW (ref 26.0–34.0)
RBC: 2.79 MIL/uL — ABNORMAL LOW (ref 3.87–5.11)

## 2012-07-20 LAB — BASIC METABOLIC PANEL
Chloride: 104 mEq/L (ref 96–112)
GFR calc Af Amer: >90 mL/min (ref >90)
GFR calc non Af Amer: 84 mL/min — ABNORMAL LOW (ref >90)
Glucose, Bld: 114 mg/dL — ABNORMAL HIGH (ref 70–99)
Potassium: 4.3 mEq/L (ref 3.5–5.1)
Sodium: 138 mEq/L (ref 135–145)

## 2012-07-20 LAB — PREPARE RBC (CROSSMATCH)

## 2012-07-20 MED ORDER — SODIUM CHLORIDE 0.9 % IV SOLN: 250.0000 mL | INTRAVENOUS | Status: DC | PRN

## 2012-07-20 MED ORDER — ACETAMINOPHEN 650 MG RE SUPP: 650.0000 mg | Freq: Four times a day (QID) | RECTAL | Status: DC | PRN

## 2012-07-20 MED ORDER — PNEUMOCOCCAL VAC POLYVALENT 25 MCG/0.5ML IJ INJ
0.5000 mL | INJECTION | INTRAMUSCULAR | Status: AC
  Administered 2012-07-21: 0.5 mL via INTRAMUSCULAR
  Filled 2012-07-20: qty 0.5

## 2012-07-20 MED ORDER — LISINOPRIL 10 MG PO TABS
10.0000 mg | ORAL_TABLET | Freq: Every day | ORAL | Status: DC
  Administered 2012-07-20 – 2012-07-21 (×2): 10 mg via ORAL
  Filled 2012-07-20 (×2): qty 1

## 2012-07-20 MED ORDER — DULOXETINE HCL 30 MG PO CPEP
30.0000 mg | ORAL_CAPSULE | Freq: Every day | ORAL | Status: DC
  Administered 2012-07-20 – 2012-07-21 (×2): 30 mg via ORAL
  Filled 2012-07-20 (×2): qty 1

## 2012-07-20 MED ORDER — HEPARIN SODIUM (PORCINE) 5000 UNIT/ML IJ SOLN
5000.0000 [IU] | Freq: Three times a day (TID) | INTRAMUSCULAR | Status: DC
  Administered 2012-07-20 – 2012-07-21 (×2): 5000 [IU] via SUBCUTANEOUS
  Filled 2012-07-20 (×5): qty 1

## 2012-07-20 MED ORDER — ACETAMINOPHEN 325 MG PO TABS: 650.0000 mg | ORAL_TABLET | Freq: Four times a day (QID) | ORAL | Status: DC | PRN

## 2012-07-20 MED ORDER — SODIUM CHLORIDE 0.9 % IJ SOLN: 3.0000 mL | INTRAMUSCULAR | Status: DC | PRN

## 2012-07-20 MED ORDER — SODIUM CHLORIDE 0.9 % IJ SOLN
3.0000 mL | Freq: Two times a day (BID) | INTRAMUSCULAR | Status: DC
  Administered 2012-07-20 – 2012-07-21 (×2): 3 mL via INTRAVENOUS

## 2012-07-20 MED ORDER — ALBUTEROL SULFATE HFA 108 (90 BASE) MCG/ACT IN AERS
2.0000 | INHALATION_SPRAY | Freq: Four times a day (QID) | RESPIRATORY_TRACT | Status: DC | PRN
  Filled 2012-07-20: qty 6.7

## 2012-07-20 NOTE — Progress Notes (Signed)
Lab just called and stated patient hemoglobin is 4.7. Dr. made aware, will continue to monitor. Burnett Corrente, RN

## 2012-07-20 NOTE — Telephone Encounter (Signed)
Dr Armen Pickup discussed this patient with me with H of 5.1, no other recent H checked, I suggested patient goes to the hospital,recheck H/H,obtain rectal exam and fecal hemoccult testing to ensure no acute blood loss. If need be to be transfused at the hospital if hemoglobin is still low. Dr Armen Pickup will call patient to advise her to go to the ER.

## 2012-07-20 NOTE — Telephone Encounter (Signed)
Called patient. Was able to reach her and explain that her hgb was critically low. She thought it was her A1c that was low and did not understand the previous messages.  She is currently not bleeding per vagina. She denies history of melena and hematochezia. She denies current chest, pain, dyspnea, weakness.   Called and spoke to my nurse, patient could be set up for outpatient transfusion next week.  Also spoke to my attending, Dr. Lum Babe, who recommends patient not wait and go to ED for transfusion and rectal check to verify there is not a lower GI bleed.  Would also like iron, folic and B12 labs drawn prior to transfusion.  Called patient to let her know plan: she states that she will aim to go to ED  tonight or tomorrow evening.

## 2012-07-20 NOTE — H&P (Signed)
Family Medicine Teaching [facility] Admission History and Physical  Patient name: Julia Dunn Medical record number: [id number] Date of birth: 1970-09-14 Age: 42 y.o. Gender: female  Primary Care Provider: Dessa Phi, MD  Chief Complaint: shortness of breath History of Present Illness: Julia Dunn is a 42 y.o. year old female with known chronic blood loss anemia secondary to uterine fibroids who presents with shortness of breath. The shortness of breath has been worsening for the last 6-8 months and occurs when walking on a flat surface > 100 feet. Additionally, she notes intermittent blurry vision. The patient has almost daily vaginal bleeding that ranges from spotting a pad to soaking a diaper. She notes that she has had fibroid tumors for years and underwent previous evaluation by an OB-GYN surgery but has not been able to have a hysterectomy due to work constraints. She was prescribed iron pills, which she has not taken for > 6 months. The patient was last seen in [facility] for this issue on 07/15/12, and a CBC revealed a hemoglobin of 5.1 g/dl. She was contacted on that day and asked to come to the hospital. She never came, so her PCP contacted Julia Dunn today and convinced her to come for treatment.    Patient Active Problem List  Diagnosis  . DIABETES MELLITUS II, UNCOMPLICATED  . Morbid obesity  . DEPRESSIVE DISORDER, NOS  . HYPERTENSION, BENIGN SYSTEMIC  . ALLERGIC RHINITIS, SEASONAL  . Menorrhagia  . Vitamin d deficiency  . Fibroids  . Anemia  . Dyspnea  . Dysuria   Past Medical History: Past Medical History  Diagnosis Date  . Diabetes mellitus   . Hypertension   . Asthma   . Anemia   . History of blood transfusion   . Bacterial vaginosis 2008  . Headache     migraines  . Kidney infection   . Hx of blood clots   . Pregnancy induced hypertension   . Depression   . Depression   . Postpartum depression   . Gestational diabetes   . Trichomonas      Past Surgical History: Past Surgical History  Procedure Laterality Date  . Tubal ligation    . Dilation and curettage of uterus      Social History: History   Social History  . Marital Status: Single    Spouse Name: N/A    Number of Children: N/A  . Years of Education: N/A   Social History Main Topics  . Smoking status: Never Smoker   . Smokeless tobacco: Never Used  . Alcohol Use: Yes     Comment: SOCIAL  . Drug Use: No  . Sexually Active: Not Currently    Birth Control/ Protection: Other-see comments     Comment: BTL   Other Topics Concern  . None   Social History Narrative  . None    Family History: Family History  Problem Relation Age of Onset  . Hypertension Father   . Heart disease Mother   . Rheumatic fever Mother   . Hypertension Mother   . Stroke Mother   . Hypertension Brother   . Stroke Brother   . Heart disease Paternal Aunt   . Sickle cell trait Daughter     Allergies: Allergies  Allergen Reactions  . Doxycycline Hyclate     REACTION: unspecified  . Penicillins     REACTION: unspecified    Current Facility-Administered Medications  Medication Dose Route Frequency Provider Last Rate Last Dose  . 0.9 %  sodium chloride infusion  250 mL Intravenous PRN Garnetta Buddy, MD      . acetaminophen (TYLENOL) tablet 650 mg  650 mg Oral Q6H PRN Garnetta Buddy, MD       Or  . acetaminophen (TYLENOL) suppository 650 mg  650 mg Rectal Q6H PRN Garnetta Buddy, MD      . albuterol (PROVENTIL HFA;VENTOLIN HFA) 108 (90 BASE) MCG/ACT inhaler 2 puff  2 puff Inhalation Q6H PRN Garnetta Buddy, MD      . DULoxetine (CYMBALTA) DR capsule 30 mg  30 mg Oral Daily Garnetta Buddy, MD   30 mg at 07/20/12 2242  . heparin injection 5,000 Units  5,000 Units Subcutaneous Q8H Garnetta Buddy, MD   5,000 Units at 07/20/12 2242  . lisinopril (PRINIVIL,ZESTRIL) tablet 10 mg  10 mg Oral Daily Garnetta Buddy, MD   10 mg at 07/20/12 2242  .  [START ON 07/21/2012] pneumococcal 23 valent vaccine (PNU-IMMUNE) injection 0.5 mL  0.5 mL Intramuscular Tomorrow-1000 Todd D McDiarmid, MD      . sodium chloride 0.9 % injection 3 mL  3 mL Intravenous Q12H Garnetta Buddy, MD   3 mL at 07/20/12 2200  . sodium chloride 0.9 % injection 3 mL  3 mL Intravenous PRN Garnetta Buddy, MD       Review Of Systems: Per HPI with the following additions: none Otherwise 12 point review of systems was performed and was unremarkable.  Physical Exam: Temp:  [98.8 F (37.1 C)] 98.8 F (37.1 C) (04/22 2045) Pulse Rate:  [102] 102 (04/22 2100) Resp:  [18] 18 (04/22 2100) BP: (144)/(79) 144/79 mmHg (04/22 2100) SpO2:  [99 %] 99 % (04/22 2100)   General: alert, cooperative, no distress and obese HEENT: PERRLA and conjunctival pallor bilaterally Heart: sinus tachycardia, no murmur appreciated  Lungs: clear to auscultation, no wheezes or rales and unlabored breathing Abdomen: abdomen is soft without significant tenderness, masses, organomegaly or guarding GU: External: no lesions Vagina: no blood in vault Cervix: no lesion; no mucopurulent d/c Uterus: numerous firm nodule, approximately 16-18 week size  Adnexa: no masses; non tender  Extremities: extremities normal, atraumatic, no cyanosis or edema, obese  Skin:no rashes Neurology: normal without focal findings, mental status, speech normal, alert and oriented x3 and PERLA  Labs and Imaging:  Results for orders placed during the hospital encounter of 07/20/12 (from the past 24 hour(s))  BASIC METABOLIC PANEL     Status: Abnormal   Collection Time    07/20/12 10:02 PM      Result Value Range   Sodium 138  135 - 145 mEq/L   Potassium 4.3  3.5 - 5.1 mEq/L   Chloride 104  96 - 112 mEq/L   CO2 27  19 - 32 mEq/L   Glucose, Bld 114 (*) 70 - 99 mg/dL   BUN 15  6 - 23 mg/dL   Creatinine, Ser 4.09  0.50 - 1.10 mg/dL   Calcium 9.3  8.4 - 81.1 mg/dL   GFR calc non Af Amer 84 (*) >90 mL/min   GFR  calc Af Amer >90  >90 mL/min  CBC     Status: Abnormal   Collection Time    07/20/12 10:02 PM      Result Value Range   WBC 7.7  4.0 - 10.5 K/uL   RBC 2.79 (*) 3.87 - 5.11 MIL/uL   Hemoglobin 4.7 (*) 12.0 - 15.0 g/dL   HCT 91.4 (*) 78.2 -  46.0 %   MCV 64.2 (*) 78.0 - 100.0 fL   MCH 16.8 (*) 26.0 - 34.0 pg   MCHC 26.3 (*) 30.0 - 36.0 g/dL   RDW 24.4 (*) 01.0 - 27.2 %   Platelets 465 (*) 150 - 400 K/uL    No results found.    Assessment and Plan: Julia Dunn is a 42 y.o. year old female presenting with severe symptomatic anemia due to chronic blood loss from uterin fibroids.   # Microcytic Anemia - Chronic blood loss, now symptomatic; Definitive treatment is hysterectomy but patient has been resistant, appears that patient does not understand severity of her anemia and potential health consequences - Transfuse 2 U PRBC - Arrange IV Fe if patient not going to be compliant with PO Fe and doesn't take more initiative to get hysterectomy   #  DM, Type 2 - Diet controlled; Last A1C 5.7 on 07/15/12 - Check CBG  # HTN - Patient not compliant with meds - Start lisinopril 20 mg   # Depression - Cont home cymbalta  # Asthma - Cont albuterol PRN  FENGI - Regular diet PPX - Heparin [place] DISPO - Admit to Family Med Teaching Service   Si Raider. Clinton Sawyer, MD, MBA 07/20/2012, 10:58 PM Family Medicine Resident, PGY-2 [phone] pager

## 2012-07-21 DIAGNOSIS — I10 Essential (primary) hypertension: Secondary | ICD-10-CM

## 2012-07-21 LAB — GLUCOSE, CAPILLARY: Glucose-Capillary: 107 mg/dL — ABNORMAL HIGH (ref 70–99)

## 2012-07-21 MED ORDER — FERROUS SULFATE 325 (65 FE) MG PO TABS: 325.0000 mg | ORAL_TABLET | Freq: Three times a day (TID) | ORAL | Status: DC

## 2012-07-21 MED ORDER — ONDANSETRON 4 MG PO TBDP
8.0000 mg | ORAL_TABLET | Freq: Three times a day (TID) | ORAL | Status: DC | PRN
  Administered 2012-07-21: 8 mg via ORAL
  Filled 2012-07-21: qty 2

## 2012-07-21 MED ORDER — SODIUM CHLORIDE 0.9 % IV SOLN
1020.0000 mg | Freq: Once | INTRAVENOUS | Status: AC
  Administered 2012-07-21: 1020 mg via INTRAVENOUS
  Filled 2012-07-21: qty 34

## 2012-07-21 NOTE — Discharge Summary (Signed)
Physician Discharge Summary  Patient ID: Julia Dunn MRN: [id number] DOB/AGE: 04-Jul-1970 42 y.o.  Admit date: 07/20/2012 Discharge date: 07/21/12  Admission Diagnoses: Anemia and dyspnea   Discharge Diagnoses:  Principal Problem:   Anemia Active Problems:   DEPRESSIVE DISORDER, NOS   HYPERTENSION, BENIGN SYSTEMIC   Discharged Condition: stable  Hospital Course:  42 year old F with chronic blood loss anemia secondary to uterine fibroids. She presented to her PCP clinic on 07/15/12 with shortness of breath and was found to have a hemoglobin of 5.1 g/dL. She was encouarged to come to the hospital for evaluation and presented on 07/20/12. At the time of presentation, the hemglobin was 4.7. She complained of shortness of breath and blurred vision. Therefore, she was given 2 units of packed red blood cells and an infusion of IV iron. Thereafter, her hemoglobin increased to 6.6 g/dL, and she was stable for discharge. Her definitive treatment is a hysterectomy. She requested referral to Dr. Wynetta Fines for evaluation, but this could not be arranged upon discharge. Therefore, I will leave it up to the PCP to make a recommendation.   Consults: None  Significant Diagnostic Studies: labs: Hgb 4.7 --> 6.6    Recent Labs Lab 07/15/12 1056 07/20/12 2202 07/21/12 1030  WBC 8.7 7.7  --   HGB 5.1* 4.7* 6.6*  HCT 19.7* 17.9* 22.3*  PLT 593* 465*  --    BMET/CMET  Recent Labs Lab 07/15/12 1056 07/20/12 2202  NA 138 138  K 4.4 4.3  CL 106 104  CO2 25 27  BUN 7 15  CREATININE 0.79 0.85  CALCIUM 9.2 9.3  PROT 6.8  --   BILITOT 0.7  --   ALKPHOS 40  --   ALT <8  --   AST 12  --   GLUCOSE 120* 114*     Treatments: 2 units of PRBC  Discharge Exam: Blood pressure 120/66, pulse 80, temperature 98.7 F (37.1 C), resp. rate 16, last menstrual period 06/29/2012, SpO2 100.00%. General: alert, cooperative, no distress and obese  HEENT: PERRLA and improved conjunctival pallor  bilaterally  Heart: RRR, no murmurs  Lungs: clear to auscultation, no wheezes or rales and unlabored breathing  Abdomen: abdomen is soft without significant tenderness, masses, organomegaly or guarding  GU:  External: no lesions  Vagina: no blood in vault  Cervix: no lesion; no mucopurulent d/c  Uterus: numerous firm nodule, approximately 16-18 week size  Adnexa: no masses; non tender  Extremities: improved palmar pallor bilaterally   Skin:no rashes  Neurology: normal without focal findings, mental status, speech normal, alert and oriented x3 and PERLA   Disposition: 01-Home or Self Care       Future Appointments Provider Department Dept Phone   07/29/2012 9:00 AM Dessa Phi, MD [facility] [phone]       Medication List    TAKE these medications       albuterol 108 (90 BASE) MCG/ACT inhaler  Commonly known as:  PROVENTIL HFA;VENTOLIN HFA  Inhale 2 puffs into the lungs every 6 (six) hours as needed for wheezing.     DULoxetine 30 MG capsule  Commonly known as:  CYMBALTA  Take 1 capsule (30 mg total) by mouth daily.     ferrous sulfate 325 (65 FE) MG tablet  Take 1 tablet (325 mg total) by mouth 3 (three) times daily with meals.     lisinopril 10 MG tablet  Commonly known as:  PRINIVIL,ZESTRIL  Take 1 tablet (10  mg total) by mouth daily.     ONE TOUCH TEST STRIPS test strip  Generic drug:  glucose blood  as directed. (Onetouch Ultra)       Follow-up Information   Follow up with Dessa Phi, MD On 07/29/2012. (9:00 AM )    Contact information:   [address] [place] [address] [phone]       Signed: Mat Carne 07/22/2012, 8:25 AM

## 2012-07-21 NOTE — Progress Notes (Signed)
Patient seen in hospital. Admitted for symptomatic anemia. She is s/p PRBC transfusion and IV iron. She is feeling better. Hgb up to 6.6. She is currently not having vaginal bleeding. I appreciate the excellent care provided by the [facility] Medicine Teaching Service, I look forward to seeing Ms. Hainline in one week for hospital follow up.   Davinci Glotfelty 5:33 PM, 07/21/2012

## 2012-07-21 NOTE — H&P (Signed)
[facility]MT[facility] Attending Admission Note: James Senn MD [phone] pager office [phone] I  have seen and examined this patient, reviewed their chart. I have discussed this patient with the resident. I agree with the resident's findings, assessment and care plan. 

## 2012-07-22 ENCOUNTER — Telehealth: Payer: Self-pay | Admitting: Family Medicine

## 2012-07-22 LAB — TYPE AND SCREEN
ABO/RH(D): O POS
Antibody Screen: NEGATIVE
Unit division: 0

## 2012-07-22 NOTE — Telephone Encounter (Signed)
LMOVM for pt to return call:  1. What questions did she have about her medications  2. I would have to send a message to Dr. Shawnie Pons to see if she was willing to see a GYN patient @ [facility]. Tylea Hise, Maryjo Rochester

## 2012-07-22 NOTE — Telephone Encounter (Signed)
Ok to book for me there--I have certainly booked hysts out of there before.

## 2012-07-22 NOTE — Telephone Encounter (Signed)
Patient needs to speak to Dr. Armen Pickup about her medications.  Also, the patient called wanting to schedule an appointment to see Dr. Shawnie Pons at the [facility] office for a consultation for surgery for a hysterectomy.  I informed her that she would have to see Dr. Shawnie Pons through [facility] for this, but she said that Dr. Shawnie Pons wasn't seeing patient for the month of May and that she could see patient at [facility] for this.  I told her that she may be able to discuss this further with Dr. Armen Pickup, but I could not make any promises.

## 2012-07-22 NOTE — Discharge Summary (Signed)
Family Medicine Teaching Service  Discharge Note : Attending Sara Neal MD Pager [phone] Office [phone] I have seen and examined this patient, reviewed their chart and discussed discharge planning wit the resident at the time of discharge. I agree with the discharge plan as above.  

## 2012-07-23 ENCOUNTER — Telehealth: Payer: Self-pay | Admitting: Family Medicine

## 2012-07-23 DIAGNOSIS — E119 Type 2 diabetes mellitus without complications: Secondary | ICD-10-CM

## 2012-07-23 NOTE — Telephone Encounter (Signed)
Your rewards for Health form completed and placed in Dr. Armen Pickup box for signature.  Ileana Ladd

## 2012-07-23 NOTE — Telephone Encounter (Signed)
Patient dropped off form to be filled out for her work.  Please call her when completed. °

## 2012-07-26 MED ORDER — FLUOXETINE HCL 20 MG PO TABS: 20.0000 mg | ORAL_TABLET | Freq: Every day | ORAL | Status: DC

## 2012-07-26 NOTE — Telephone Encounter (Signed)
Called patient. Form placed up front for pick up. Patient does not need glipizide. cymbalta too expensive, dcd.  prozac sent in instead.

## 2012-07-26 NOTE — Addendum Note (Signed)
Addended by: Dessa Phi on: 07/26/2012 01:51 PM   Modules accepted: Orders

## 2012-07-28 ENCOUNTER — Telehealth: Payer: Self-pay | Admitting: Family Medicine

## 2012-07-28 NOTE — Telephone Encounter (Signed)
Patient dropped off FMLA forms to be filled out.  Please fax to [phone] when completed and let her know so she can pick up a copy also.

## 2012-07-29 ENCOUNTER — Ambulatory Visit (INDEPENDENT_AMBULATORY_CARE_PROVIDER_SITE_OTHER): Payer: 59 | Admitting: Family Medicine

## 2012-07-29 ENCOUNTER — Encounter: Payer: Self-pay | Admitting: Family Medicine

## 2012-07-29 VITALS — BP 143/85 | HR 74 | Temp 98.4°F | Ht 65.0 in | Wt 226.0 lb

## 2012-07-29 DIAGNOSIS — D259 Leiomyoma of uterus, unspecified: Secondary | ICD-10-CM

## 2012-07-29 DIAGNOSIS — Z124 Encounter for screening for malignant neoplasm of cervix: Secondary | ICD-10-CM

## 2012-07-29 DIAGNOSIS — N920 Excessive and frequent menstruation with regular cycle: Secondary | ICD-10-CM

## 2012-07-29 DIAGNOSIS — D649 Anemia, unspecified: Secondary | ICD-10-CM

## 2012-07-29 DIAGNOSIS — N898 Other specified noninflammatory disorders of vagina: Secondary | ICD-10-CM

## 2012-07-29 DIAGNOSIS — D219 Benign neoplasm of connective and other soft tissue, unspecified: Secondary | ICD-10-CM

## 2012-07-29 DIAGNOSIS — E559 Vitamin D deficiency, unspecified: Secondary | ICD-10-CM

## 2012-07-29 LAB — POCT WET PREP (WET MOUNT): Clue Cells Wet Prep Whiff POC: POSITIVE

## 2012-07-29 LAB — IRON AND TIBC
%SAT: 23 % (ref 20–55)
TIBC: 392 ug/dL (ref 250–470)

## 2012-07-29 LAB — FERRITIN: Ferritin: 168 ng/mL (ref 10–291)

## 2012-07-29 NOTE — Assessment & Plan Note (Signed)
A: no active bleeding.  P: referral to gyn to evaluate for possible hysterectomy.

## 2012-07-29 NOTE — Telephone Encounter (Signed)
FMLA for filled out. Copy given to patient today Original placed in to be faxed box with instructions to scan after faxing.

## 2012-07-29 NOTE — Patient Instructions (Addendum)
Julia Dunn,  Thank you for coming in today. I have placed a referral for gyn which my clinic staff will schedule.   I will be in touch with blood work results. Please continue iron. Start lisinopril and celexa.  See me in 3 weeks.   Dr. Armen Pickup

## 2012-07-29 NOTE — Progress Notes (Signed)
Subjective:     Patient ID: Julia Dunn, female   DOB: 08/06/1970, 42 y.o.   MRN: [id number]  HPI 42 yo F presents for hospital follow up.   1. Hospital follow up: patient admitted 4/22-4/23/214 for symptomatic anemia with Hgb 4.7 she received blood transfusion and IV iron. Source of bleeding was most likely chronic uterine bleeding related to submucosal uterine fibroid and non-compliance with oral iron.  Since discharge she also mild SOB, much improved from previous. She denies palpitations and chest pain. She is compliant with oral iron. No vaginal bleeding.   2. Vaginal odor: for many weeks. Minimal discharge. No itching or irritation.   3. HTN: has not started meds.   4. Vitamin D def: no oral vit D.   Review of Systems As per HPI     Objective:   Physical Exam BP 143/85  Pulse 74  Temp(Src) 98.4 F (36.9 C) (Oral)  Ht 5\' 5"  (1.651 m)  Wt 226 lb (102.513 kg)  BMI 37.61 kg/m2  LMP 06/29/2012 General appearance: alert, cooperative and no distress Lungs: clear to auscultation bilaterally Heart: regular rate and rhythm, S1, S2 normal, no murmur, click, rub or gallop Pelvic: cervix normal in appearance, external genitalia normal, no adnexal masses or tenderness, no cervical motion tenderness and uterus is irregular and enlarged. Spans to 2 cm below umbilicus.     Assessment and Plan:

## 2012-07-29 NOTE — Assessment & Plan Note (Signed)
A: no active bleeding currently. Significant bleeding causes severe anemia, suspect chronic. P: Oral iron This has been a problem for 5 years, patient needs definitive treatment.  Gyn referral placed.

## 2012-07-29 NOTE — Assessment & Plan Note (Signed)
Repeat vitamin D level. Will likely restart oral vitamin D.

## 2012-07-29 NOTE — Assessment & Plan Note (Signed)
A: improved s/p blood transfusion and IV iron Lab Results  Component Value Date   HGB 8.0* 07/29/2012  P: Continue oral iron Check iron studies

## 2012-07-30 ENCOUNTER — Telehealth: Payer: Self-pay | Admitting: Family Medicine

## 2012-07-30 MED ORDER — CALCIUM CITRATE 950 (200 CA) MG PO TABS: 2.0000 | ORAL_TABLET | Freq: Every day | ORAL | Status: DC

## 2012-07-30 MED ORDER — VITAMIN D (ERGOCALCIFEROL) 1.25 MG (50000 UNIT) PO CAPS: 50000.0000 [IU] | ORAL_CAPSULE | ORAL | Status: DC

## 2012-07-30 NOTE — Telephone Encounter (Signed)
Iron studies wnl. Vitamin D still low will replace, vitamin D and calcium.

## 2012-08-04 ENCOUNTER — Encounter: Payer: 59 | Admitting: Family Medicine

## 2012-08-04 DIAGNOSIS — D259 Leiomyoma of uterus, unspecified: Secondary | ICD-10-CM

## 2012-08-19 ENCOUNTER — Other Ambulatory Visit: Payer: Self-pay | Admitting: Obstetrics and Gynecology

## 2012-10-07 ENCOUNTER — Other Ambulatory Visit: Payer: Self-pay

## 2012-10-19 ENCOUNTER — Other Ambulatory Visit: Payer: Self-pay | Admitting: Obstetrics and Gynecology

## 2012-11-01 ENCOUNTER — Encounter (HOSPITAL_COMMUNITY): Payer: Self-pay | Admitting: Pharmacy Technician

## 2012-11-02 ENCOUNTER — Encounter (HOSPITAL_COMMUNITY): Payer: Self-pay | Admitting: *Deleted

## 2012-11-02 NOTE — H&P (Signed)
  Admission History and Physical Exam for a Gynecology Patient  [name] is a 42 y.o. female, [phone], who presents for  An LAVH, Possible TAH. She has been followed at the [facility] and Gynecology division of [facility]. She has a known history of menorrhagia, anemia, and fibroids. An US showed a 14 x 10 cm multifibroid uterus. Her endometrial biopsy is benign.  OB History   Grav Para Term Preterm Abortions TAB SAB Ect Mult Living   6 3 3  3     3       Past Medical History  Diagnosis Date  . Hypertension   . Asthma   . Anemia   . History of blood transfusion   . Bacterial vaginosis 2008  . Headache(784.0)     migraines  . Hx of blood clots   . Pregnancy induced hypertension   . Depression   . Depression   . Postpartum depression   . Trichomonas   . Diabetes mellitus     no meds- diet controlled  . Gestational diabetes   . Kidney infection     No prescriptions prior to admission    Past Surgical History  Procedure Laterality Date  . Tubal ligation    . Dilation and curettage of uterus      Allergies  Allergen Reactions  . Penicillins     REACTION: unspecified    Family History: family history includes Heart disease in her mother and paternal aunt; Hypertension in her brother, father, and mother; Rheumatic fever in her mother; Sickle cell trait in her daughter; and Stroke in her brother and mother.  Social History:  reports that she has never smoked. She has never used smokeless tobacco. She reports that  drinks alcohol. She reports that she does not use illicit drugs.  Review of systems: See HPI.  Admission Physical Exam:    There is no weight on file to calculate BMI.  There were no vitals taken for this visit.  HEENT:                 Within normal limits Chest:                   Clear Heart:                    Regular rate and rhythm Breasts:                No masses, skin changes, bleeding, or discharge  present Abdomen:             Nontender, no masses Extremities:          Grossly normal Neurologic exam: Grossly normal  Pelvic exam:  EGBUS: WNL Vag: Cystocele Cx: Normal Uterus: 14 weeks size, irregular Adnexa: No masses RV: Confirms  Assessment:  Menorrhagia  Anemia  Fibroids  Plan:  LAVH, Possible TAH   Olajuwon Fosdick V 11/02/2012

## 2012-11-03 ENCOUNTER — Encounter (HOSPITAL_COMMUNITY)
Admission: RE | Disposition: A | Discharge status: Home / Self Care | Payer: Self-pay | Source: Ambulatory Visit | Attending: Obstetrics and Gynecology

## 2012-11-03 ENCOUNTER — Encounter (HOSPITAL_COMMUNITY): Payer: Self-pay | Admitting: Anesthesiology

## 2012-11-03 ENCOUNTER — Ambulatory Visit (HOSPITAL_COMMUNITY): Payer: 59 | Admitting: Anesthesiology

## 2012-11-03 ENCOUNTER — Encounter (HOSPITAL_COMMUNITY): Payer: Self-pay

## 2012-11-03 ENCOUNTER — Inpatient Hospital Stay (HOSPITAL_COMMUNITY)
Admission: RE | Admit: 2012-11-03 | Discharge: 2012-11-05 | DRG: 743 | Disposition: A | Discharge status: Home / Self Care | Payer: 59 | Source: Ambulatory Visit | Attending: Obstetrics and Gynecology | Admitting: Obstetrics and Gynecology

## 2012-11-03 DIAGNOSIS — I10 Essential (primary) hypertension: Secondary | ICD-10-CM | POA: Diagnosis present

## 2012-11-03 DIAGNOSIS — N840 Polyp of corpus uteri: Secondary | ICD-10-CM | POA: Diagnosis present

## 2012-11-03 DIAGNOSIS — G4730 Sleep apnea, unspecified: Secondary | ICD-10-CM | POA: Diagnosis present

## 2012-11-03 DIAGNOSIS — D252 Subserosal leiomyoma of uterus: Secondary | ICD-10-CM | POA: Diagnosis present

## 2012-11-03 DIAGNOSIS — Z880 Allergy status to penicillin: Secondary | ICD-10-CM

## 2012-11-03 DIAGNOSIS — Z5331 Laparoscopic surgical procedure converted to open procedure: Secondary | ICD-10-CM

## 2012-11-03 DIAGNOSIS — N920 Excessive and frequent menstruation with regular cycle: Principal | ICD-10-CM | POA: Diagnosis present

## 2012-11-03 DIAGNOSIS — N8111 Cystocele, midline: Secondary | ICD-10-CM | POA: Diagnosis present

## 2012-11-03 DIAGNOSIS — N8189 Other female genital prolapse: Secondary | ICD-10-CM | POA: Diagnosis present

## 2012-11-03 DIAGNOSIS — E119 Type 2 diabetes mellitus without complications: Secondary | ICD-10-CM | POA: Diagnosis present

## 2012-11-03 DIAGNOSIS — D649 Anemia, unspecified: Secondary | ICD-10-CM | POA: Diagnosis present

## 2012-11-03 DIAGNOSIS — N393 Stress incontinence (female) (male): Secondary | ICD-10-CM | POA: Diagnosis present

## 2012-11-03 DIAGNOSIS — E669 Obesity, unspecified: Secondary | ICD-10-CM | POA: Diagnosis present

## 2012-11-03 DIAGNOSIS — D250 Submucous leiomyoma of uterus: Secondary | ICD-10-CM | POA: Diagnosis present

## 2012-11-03 DIAGNOSIS — D251 Intramural leiomyoma of uterus: Secondary | ICD-10-CM | POA: Diagnosis present

## 2012-11-03 HISTORY — PX: BURCH PROCEDURE: SHX1273

## 2012-11-03 HISTORY — PX: ABDOMINAL HYSTERECTOMY: SHX81

## 2012-11-03 HISTORY — PX: LAPAROSCOPY: SHX197

## 2012-11-03 HISTORY — PX: BILATERAL SALPINGECTOMY: SHX5743

## 2012-11-03 HISTORY — PX: CYSTOSCOPY: SHX5120

## 2012-11-03 LAB — BASIC METABOLIC PANEL
BUN: 9 mg/dL (ref 6–23)
Chloride: 103 mEq/L (ref 96–112)
GFR calc Af Amer: >90 mL/min (ref >90)
GFR calc non Af Amer: 89 mL/min — ABNORMAL LOW (ref >90)
Glucose, Bld: 156 mg/dL — ABNORMAL HIGH (ref 70–99)
Potassium: 3.9 mEq/L (ref 3.5–5.1)
Sodium: 137 mEq/L (ref 135–145)

## 2012-11-03 LAB — CBC
HCT: 22.7 % — ABNORMAL LOW (ref 36.0–46.0)
MCV: 72.3 fL — ABNORMAL LOW (ref 78.0–100.0)
Platelets: 279 10*3/uL (ref 150–400)
RBC: 3.14 MIL/uL — ABNORMAL LOW (ref 3.87–5.11)
WBC: 7.9 10*3/uL (ref 4.0–10.5)

## 2012-11-03 LAB — ABO/RH: ABO/RH(D): O POS

## 2012-11-03 LAB — GLUCOSE, CAPILLARY
Glucose-Capillary: 258 mg/dL — ABNORMAL HIGH (ref 70–99)
Glucose-Capillary: 191 mg/dL — ABNORMAL HIGH (ref 70–99)

## 2012-11-03 SURGERY — LAPAROSCOPY, DIAGNOSTIC
Anesthesia: General | Site: Bladder | Wound class: Clean

## 2012-11-03 MED ORDER — LABETALOL HCL 5 MG/ML IV SOLN
INTRAVENOUS | Status: DC | PRN
  Administered 2012-11-03 (×3): 5 mg via INTRAVENOUS

## 2012-11-03 MED ORDER — ROCURONIUM BROMIDE 50 MG/5ML IV SOLN
INTRAVENOUS | Status: AC
  Filled 2012-11-03: qty 1

## 2012-11-03 MED ORDER — METOCLOPRAMIDE HCL 5 MG/ML IJ SOLN
10.0000 mg | Freq: Once | INTRAMUSCULAR | Status: DC | PRN
Start: 2012-11-03 — End: 2012-11-03

## 2012-11-03 MED ORDER — INDIGOTINDISULFONATE SODIUM 8 MG/ML IJ SOLN
INTRAMUSCULAR | Status: DC | PRN
  Administered 2012-11-03: 5 mL via INTRAVENOUS

## 2012-11-03 MED ORDER — FENTANYL CITRATE 0.05 MG/ML IJ SOLN
INTRAMUSCULAR | Status: DC | PRN
  Administered 2012-11-03: 50 ug via INTRAVENOUS
  Administered 2012-11-03: 100 ug via INTRAVENOUS
  Administered 2012-11-03: 50 ug via INTRAVENOUS
  Administered 2012-11-03: 100 ug via INTRAVENOUS
  Administered 2012-11-03 (×3): 50 ug via INTRAVENOUS

## 2012-11-03 MED ORDER — ACETAMINOPHEN 160 MG/5ML PO SOLN
ORAL | Status: AC
  Administered 2012-11-03: 975 mg via ORAL
  Filled 2012-11-03: qty 40.6

## 2012-11-03 MED ORDER — NALOXONE HCL 0.4 MG/ML IJ SOLN: 0.4000 mg | INTRAMUSCULAR | Status: DC | PRN

## 2012-11-03 MED ORDER — GLYCOPYRROLATE 0.2 MG/ML IJ SOLN
INTRAMUSCULAR | Status: AC
  Filled 2012-11-03: qty 3

## 2012-11-03 MED ORDER — GLYCOPYRROLATE 0.2 MG/ML IJ SOLN
INTRAMUSCULAR | Status: DC | PRN
  Administered 2012-11-03: 0.6 mg via INTRAVENOUS

## 2012-11-03 MED ORDER — IBUPROFEN 600 MG PO TABS
600.0000 mg | ORAL_TABLET | Freq: Four times a day (QID) | ORAL | Status: DC | PRN
  Administered 2012-11-04 – 2012-11-05 (×3): 600 mg via ORAL
  Filled 2012-11-03 (×3): qty 1

## 2012-11-03 MED ORDER — BUPIVACAINE-EPINEPHRINE (PF) 0.5% -1:200000 IJ SOLN
INTRAMUSCULAR | Status: AC
  Filled 2012-11-03: qty 10

## 2012-11-03 MED ORDER — PROMETHAZINE HCL 25 MG PO TABS: 12.5000 mg | ORAL_TABLET | Freq: Four times a day (QID) | ORAL | Status: DC | PRN

## 2012-11-03 MED ORDER — ONDANSETRON HCL 4 MG PO TABS: 4.0000 mg | ORAL_TABLET | Freq: Three times a day (TID) | ORAL | Status: DC | PRN

## 2012-11-03 MED ORDER — ROCURONIUM BROMIDE 50 MG/5ML IV SOLN
INTRAVENOUS | Status: AC
Start: 2012-11-03 — End: 2012-11-03
  Filled 2012-11-03: qty 1

## 2012-11-03 MED ORDER — LIDOCAINE HCL (CARDIAC) 20 MG/ML IV SOLN
INTRAVENOUS | Status: DC | PRN
  Administered 2012-11-03: 50 mg via INTRAVENOUS

## 2012-11-03 MED ORDER — VASOPRESSIN 20 UNIT/ML IJ SOLN
INTRAMUSCULAR | Status: AC
  Filled 2012-11-03: qty 1

## 2012-11-03 MED ORDER — LACTATED RINGERS IV SOLN
INTRAVENOUS | Status: DC
  Administered 2012-11-03 (×4): via INTRAVENOUS

## 2012-11-03 MED ORDER — CEFAZOLIN SODIUM-DEXTROSE 2-3 GM-% IV SOLR
2.0000 g | INTRAVENOUS | Status: AC
  Administered 2012-11-03: 2 g via INTRAVENOUS

## 2012-11-03 MED ORDER — KETOROLAC TROMETHAMINE 30 MG/ML IJ SOLN
INTRAMUSCULAR | Status: DC | PRN
  Administered 2012-11-03: 30 mg via INTRAMUSCULAR
  Administered 2012-11-03: 30 mg via INTRAVENOUS

## 2012-11-03 MED ORDER — BUPIVACAINE HCL (PF) 0.5 % IJ SOLN
INTRAMUSCULAR | Status: AC
  Filled 2012-11-03: qty 30

## 2012-11-03 MED ORDER — HYDROMORPHONE 0.3 MG/ML IV SOLN
INTRAVENOUS | Status: DC
  Administered 2012-11-03 (×2): 0.999 mg via INTRAVENOUS
  Administered 2012-11-03: 16:00:00 via INTRAVENOUS
  Administered 2012-11-04: 0.599 mg via INTRAVENOUS
  Administered 2012-11-04: 0.2 mg via INTRAVENOUS
  Administered 2012-11-04: 0.6 mg via INTRAVENOUS
  Filled 2012-11-03: qty 25

## 2012-11-03 MED ORDER — BUPIVACAINE-EPINEPHRINE 0.5% -1:200000 IJ SOLN
INTRAMUSCULAR | Status: DC | PRN
  Administered 2012-11-03: 6 mL

## 2012-11-03 MED ORDER — FENTANYL CITRATE 0.05 MG/ML IJ SOLN
INTRAMUSCULAR | Status: AC
  Filled 2012-11-03: qty 5

## 2012-11-03 MED ORDER — ONDANSETRON HCL 4 MG/2ML IJ SOLN
4.0000 mg | Freq: Four times a day (QID) | INTRAMUSCULAR | Status: DC | PRN
  Administered 2012-11-04: 4 mg via INTRAVENOUS
  Filled 2012-11-03: qty 2

## 2012-11-03 MED ORDER — MIDAZOLAM HCL 2 MG/2ML IJ SOLN
INTRAMUSCULAR | Status: AC
  Filled 2012-11-03: qty 2

## 2012-11-03 MED ORDER — HYDROMORPHONE HCL PF 1 MG/ML IJ SOLN
INTRAMUSCULAR | Status: AC
  Administered 2012-11-03: 0.5 mg via INTRAVENOUS
  Filled 2012-11-03: qty 1

## 2012-11-03 MED ORDER — PROPOFOL 10 MG/ML IV BOLUS
INTRAVENOUS | Status: DC | PRN
  Administered 2012-11-03: 190 mg via INTRAVENOUS

## 2012-11-03 MED ORDER — BUPIVACAINE LIPOSOME 1.3 % IJ SUSP
20.0000 mL | Freq: Once | INTRAMUSCULAR | Status: AC
  Administered 2012-11-03: 20 mL
  Filled 2012-11-03: qty 20

## 2012-11-03 MED ORDER — ENOXAPARIN SODIUM 40 MG/0.4ML ~~LOC~~ SOLN
40.0000 mg | Freq: Every day | SUBCUTANEOUS | Status: DC
  Administered 2012-11-03 – 2012-11-04 (×2): 40 mg via SUBCUTANEOUS
  Filled 2012-11-03 (×2): qty 0.4

## 2012-11-03 MED ORDER — ONDANSETRON HCL 4 MG/2ML IJ SOLN
INTRAMUSCULAR | Status: DC | PRN
  Administered 2012-11-03: 4 mg via INTRAVENOUS

## 2012-11-03 MED ORDER — SODIUM CHLORIDE 0.9 % IV SOLN
INTRAVENOUS | Status: DC | PRN
  Administered 2012-11-03: 10:00:00 via INTRAVENOUS

## 2012-11-03 MED ORDER — GLYBURIDE 5 MG PO TABS
10.0000 mg | ORAL_TABLET | Freq: Every day | ORAL | Status: DC
  Administered 2012-11-03 – 2012-11-05 (×2): 10 mg via ORAL
  Filled 2012-11-03 (×2): qty 2

## 2012-11-03 MED ORDER — CEFAZOLIN SODIUM-DEXTROSE 2-3 GM-% IV SOLR
INTRAVENOUS | Status: AC
  Filled 2012-11-03: qty 50

## 2012-11-03 MED ORDER — FENTANYL CITRATE 0.05 MG/ML IJ SOLN
INTRAMUSCULAR | Status: AC
  Filled 2012-11-03: qty 2

## 2012-11-03 MED ORDER — PROMETHAZINE HCL 25 MG/ML IJ SOLN: 12.5000 mg | Freq: Four times a day (QID) | INTRAMUSCULAR | Status: DC | PRN

## 2012-11-03 MED ORDER — FLUOXETINE HCL 20 MG PO TABS
20.0000 mg | ORAL_TABLET | Freq: Every day | ORAL | Status: DC
  Administered 2012-11-04: 20 mg via ORAL
  Filled 2012-11-03 (×3): qty 1

## 2012-11-03 MED ORDER — MENTHOL 3 MG MT LOZG: 1.0000 | LOZENGE | OROMUCOSAL | Status: DC | PRN

## 2012-11-03 MED ORDER — PHENYLEPHRINE 40 MCG/ML (10ML) SYRINGE FOR IV PUSH (FOR BLOOD PRESSURE SUPPORT)
PREFILLED_SYRINGE | INTRAVENOUS | Status: AC
  Filled 2012-11-03: qty 5

## 2012-11-03 MED ORDER — LACTATED RINGERS IV SOLN
INTRAVENOUS | Status: DC
  Administered 2012-11-03 – 2012-11-04 (×2): via INTRAVENOUS

## 2012-11-03 MED ORDER — OXYCODONE-ACETAMINOPHEN 5-325 MG PO TABS
1.0000 | ORAL_TABLET | ORAL | Status: DC | PRN
  Administered 2012-11-04: 2 via ORAL
  Administered 2012-11-04 – 2012-11-05 (×5): 1 via ORAL
  Filled 2012-11-03 (×2): qty 1
  Filled 2012-11-03: qty 2
  Filled 2012-11-03: qty 1
  Filled 2012-11-03: qty 2
  Filled 2012-11-03: qty 1

## 2012-11-03 MED ORDER — PROPOFOL 10 MG/ML IV EMUL
INTRAVENOUS | Status: AC
  Filled 2012-11-03: qty 20

## 2012-11-03 MED ORDER — DIPHENHYDRAMINE HCL 12.5 MG/5ML PO ELIX: 12.5000 mg | ORAL_SOLUTION | Freq: Four times a day (QID) | ORAL | Status: DC | PRN

## 2012-11-03 MED ORDER — INDIGOTINDISULFONATE SODIUM 8 MG/ML IJ SOLN
INTRAMUSCULAR | Status: AC
  Filled 2012-11-03: qty 5

## 2012-11-03 MED ORDER — NEOSTIGMINE METHYLSULFATE 1 MG/ML IJ SOLN
INTRAMUSCULAR | Status: AC
Start: 2012-11-03 — End: 2012-11-03
  Filled 2012-11-03: qty 1

## 2012-11-03 MED ORDER — KETOROLAC TROMETHAMINE 30 MG/ML IJ SOLN: 15.0000 mg | Freq: Once | INTRAMUSCULAR | Status: DC | PRN

## 2012-11-03 MED ORDER — KETOROLAC TROMETHAMINE 30 MG/ML IJ SOLN
30.0000 mg | Freq: Four times a day (QID) | INTRAMUSCULAR | Status: AC
  Administered 2012-11-03 – 2012-11-04 (×2): 30 mg via INTRAVENOUS
  Filled 2012-11-03 (×2): qty 1

## 2012-11-03 MED ORDER — NEOSTIGMINE METHYLSULFATE 1 MG/ML IJ SOLN
INTRAMUSCULAR | Status: DC | PRN
  Administered 2012-11-03: 3 mg via INTRAVENOUS

## 2012-11-03 MED ORDER — SODIUM CHLORIDE 0.9 % IJ SOLN
INTRAMUSCULAR | Status: DC | PRN
  Administered 2012-11-03: 10 mL

## 2012-11-03 MED ORDER — MIDAZOLAM HCL 5 MG/5ML IJ SOLN
INTRAMUSCULAR | Status: DC | PRN
  Administered 2012-11-03: 2 mg via INTRAVENOUS

## 2012-11-03 MED ORDER — HYDROMORPHONE HCL PF 1 MG/ML IJ SOLN
0.2500 mg | INTRAMUSCULAR | Status: DC | PRN
  Administered 2012-11-03: 0.5 mg via INTRAVENOUS

## 2012-11-03 MED ORDER — ROCURONIUM BROMIDE 100 MG/10ML IV SOLN
INTRAVENOUS | Status: DC | PRN
  Administered 2012-11-03: 5 mg via INTRAVENOUS
  Administered 2012-11-03: 10 mg via INTRAVENOUS
  Administered 2012-11-03: 30 mg via INTRAVENOUS
  Administered 2012-11-03: 10 mg via INTRAVENOUS
  Administered 2012-11-03: 5 mg via INTRAVENOUS
  Administered 2012-11-03: 10 mg via INTRAVENOUS
  Administered 2012-11-03: 5 mg via INTRAVENOUS

## 2012-11-03 MED ORDER — LABETALOL HCL 5 MG/ML IV SOLN
INTRAVENOUS | Status: AC
  Filled 2012-11-03: qty 4

## 2012-11-03 MED ORDER — LISINOPRIL 10 MG PO TABS
10.0000 mg | ORAL_TABLET | Freq: Every day | ORAL | Status: DC
  Administered 2012-11-04: 10 mg via ORAL
  Filled 2012-11-03 (×2): qty 1

## 2012-11-03 MED ORDER — STERILE WATER FOR IRRIGATION IR SOLN
Status: DC | PRN
  Administered 2012-11-03: 13:00:00 via INTRAVESICAL

## 2012-11-03 MED ORDER — DIPHENHYDRAMINE HCL 50 MG/ML IJ SOLN: 12.5000 mg | Freq: Four times a day (QID) | INTRAMUSCULAR | Status: DC | PRN

## 2012-11-03 MED ORDER — ONDANSETRON HCL 4 MG/2ML IJ SOLN
INTRAMUSCULAR | Status: AC
  Filled 2012-11-03: qty 2

## 2012-11-03 MED ORDER — KETOROLAC TROMETHAMINE 30 MG/ML IJ SOLN
INTRAMUSCULAR | Status: AC
  Filled 2012-11-03: qty 2

## 2012-11-03 MED ORDER — LIDOCAINE HCL (CARDIAC) 20 MG/ML IV SOLN
INTRAVENOUS | Status: AC
  Filled 2012-11-03: qty 5

## 2012-11-03 MED ORDER — PHENYLEPHRINE HCL 10 MG/ML IJ SOLN
INTRAMUSCULAR | Status: DC | PRN
  Administered 2012-11-03 (×2): 80 ug via INTRAVENOUS

## 2012-11-03 MED ORDER — FERROUS SULFATE 325 (65 FE) MG PO TABS
325.0000 mg | ORAL_TABLET | Freq: Every day | ORAL | Status: DC
  Administered 2012-11-04: 325 mg via ORAL
  Filled 2012-11-03 (×2): qty 1

## 2012-11-03 MED ORDER — ACETAMINOPHEN 160 MG/5ML PO SOLN: 975.0000 mg | Freq: Once | ORAL | Status: AC

## 2012-11-03 MED ORDER — FERROUS SULFATE 325 (65 FE) MG PO TABS
650.0000 mg | ORAL_TABLET | Freq: Every day | ORAL | Status: DC
  Administered 2012-11-04 – 2012-11-05 (×2): 650 mg via ORAL
  Filled 2012-11-03 (×3): qty 1

## 2012-11-03 MED ORDER — SODIUM CHLORIDE 0.9 % IJ SOLN: 9.0000 mL | INTRAMUSCULAR | Status: DC | PRN

## 2012-11-03 SURGICAL SUPPLY — 71 items
CABLE HIGH FREQUENCY MONO STRZ (ELECTRODE) IMPLANT
CANISTER SUCTION 2500CC (MISCELLANEOUS) ×5 IMPLANT
CATH FOLEY 3WAY 30CC 16FR (CATHETERS) IMPLANT
CATH ROBINSON RED A/P 16FR (CATHETERS) IMPLANT
CHLORAPREP W/TINT 26ML (MISCELLANEOUS) ×5 IMPLANT
CLOTH BEACON ORANGE TIMEOUT ST (SAFETY) ×5 IMPLANT
CONT PATH 16OZ SNAP LID 3702 (MISCELLANEOUS) ×5 IMPLANT
COVER MAYO STAND STRL (DRAPES) ×5 IMPLANT
COVER TABLE BACK 60X90 (DRAPES) ×5 IMPLANT
DECANTER SPIKE VIAL GLASS SM (MISCELLANEOUS) ×15 IMPLANT
DRAPE CESAREAN BIRTH W POUCH (DRAPES) IMPLANT
DRAPE PROXIMA HALF (DRAPES) ×5 IMPLANT
DRSG OPSITE POSTOP 4X10 (GAUZE/BANDAGES/DRESSINGS) ×5 IMPLANT
ELECT REM PT RETURN 9FT ADLT (ELECTROSURGICAL) ×5
ELECTRODE REM PT RTRN 9FT ADLT (ELECTROSURGICAL) ×4 IMPLANT
FORMULA 20CAL 3 OZ MEAD (FORMULA) IMPLANT
GAUZE SPONGE 4X4 16PLY XRAY LF (GAUZE/BANDAGES/DRESSINGS) ×5 IMPLANT
GAUZE VASELINE 3X9 (GAUZE/BANDAGES/DRESSINGS) IMPLANT
GLOVE BIOGEL PI IND STRL 6.5 (GLOVE) ×4 IMPLANT
GLOVE BIOGEL PI IND STRL 8.5 (GLOVE) ×4 IMPLANT
GLOVE BIOGEL PI INDICATOR 6.5 (GLOVE) ×1
GLOVE BIOGEL PI INDICATOR 8.5 (GLOVE) ×1
GLOVE ECLIPSE 8.0 STRL XLNG CF (GLOVE) ×20 IMPLANT
GOWN PREVENTION PLUS LG XLONG (DISPOSABLE) ×10 IMPLANT
GOWN PREVENTION PLUS XXLARGE (GOWN DISPOSABLE) ×5 IMPLANT
GOWN STRL REIN XL XLG (GOWN DISPOSABLE) ×20 IMPLANT
NEEDLE HYPO 18GX1.5 BLUNT FILL (NEEDLE) IMPLANT
NEEDLE HYPO 25X1 1.5 SAFETY (NEEDLE) IMPLANT
NEEDLE MAYO .5 CIRCLE (NEEDLE) ×5 IMPLANT
NEEDLE MAYO 6 CRC TAPER PT (NEEDLE) ×5 IMPLANT
NS IRRIG 1000ML POUR BTL (IV SOLUTION) ×5 IMPLANT
PACK ABDOMINAL GYN (CUSTOM PROCEDURE TRAY) IMPLANT
PACK LAVH (CUSTOM PROCEDURE TRAY) ×5 IMPLANT
PAD ABD 7.5X8 STRL (GAUZE/BANDAGES/DRESSINGS) ×5 IMPLANT
PAD OB MATERNITY 4.3X12.25 (PERSONAL CARE ITEMS) ×5 IMPLANT
PLUG CATH AND CAP STER (CATHETERS) IMPLANT
PROTECTOR NERVE ULNAR (MISCELLANEOUS) ×5 IMPLANT
SET CYSTO W/LG BORE CLAMP LF (SET/KITS/TRAYS/PACK) ×5 IMPLANT
SET IRRIG TUBING LAPAROSCOPIC (IRRIGATION / IRRIGATOR) IMPLANT
SOLUTION ELECTROLUBE (MISCELLANEOUS) IMPLANT
SPONGE LAP 18X18 X RAY DECT (DISPOSABLE) ×20 IMPLANT
STAPLER VISISTAT 35W (STAPLE) ×5 IMPLANT
STRIP CLOSURE SKIN 1/4X3 (GAUZE/BANDAGES/DRESSINGS) IMPLANT
SUT CHROMIC 1 CT1 27 (SUTURE) IMPLANT
SUT CHROMIC 3 0 SH 27 (SUTURE) ×5 IMPLANT
SUT MNCRL AB 3-0 PS2 27 (SUTURE) ×10 IMPLANT
SUT PDS AB 0 CT 36 (SUTURE) ×20 IMPLANT
SUT PDS AB 1 CTX 36 (SUTURE) IMPLANT
SUT VIC AB 0 CT1 18XCR BRD8 (SUTURE) ×8 IMPLANT
SUT VIC AB 0 CT1 27 (SUTURE) ×3
SUT VIC AB 0 CT1 27XBRD ANBCTR (SUTURE) ×12 IMPLANT
SUT VIC AB 0 CT1 8-18 (SUTURE) ×2
SUT VIC AB 2-0 CT1 27 (SUTURE)
SUT VIC AB 2-0 CT1 TAPERPNT 27 (SUTURE) IMPLANT
SUT VIC AB 2-0 SH 27 (SUTURE) ×1
SUT VIC AB 2-0 SH 27XBRD (SUTURE) ×4 IMPLANT
SUT VIC AB 3-0 CT1 27 (SUTURE) ×1
SUT VIC AB 3-0 CT1 TAPERPNT 27 (SUTURE) ×4 IMPLANT
SUT VIC AB 4-0 PS2 27 (SUTURE) IMPLANT
SUT VICRYL 0 ENDOLOOP (SUTURE) IMPLANT
SUT VICRYL 0 TIES 12 18 (SUTURE) ×5 IMPLANT
SUT VICRYL 0 UR6 27IN ABS (SUTURE) ×10 IMPLANT
SYR 30ML LL (SYRINGE) IMPLANT
SYR 50ML LL SCALE MARK (SYRINGE) ×5 IMPLANT
SYR BULB IRRIGATION 50ML (SYRINGE) ×5 IMPLANT
SYR CONTROL 10ML LL (SYRINGE) IMPLANT
TAPE CLOTH SURG 4X10 WHT LF (GAUZE/BANDAGES/DRESSINGS) ×5 IMPLANT
TOWEL OR 17X24 6PK STRL BLUE (TOWEL DISPOSABLE) ×10 IMPLANT
TRAY FOLEY CATH 14FR (SET/KITS/TRAYS/PACK) ×5 IMPLANT
WARMER LAPAROSCOPE (MISCELLANEOUS) ×5 IMPLANT
WATER STERILE IRR 1000ML POUR (IV SOLUTION) IMPLANT

## 2012-11-03 NOTE — Progress Notes (Addendum)
The patient was interviewed and examined today.  The previously documented history and physical examination was reviewed. The operative procedure was reviewed. The risks and benefits were outlined again. The specific risks include, but are not limited to, anesthetic complications, bleeding, infections, and possible damage to the surrounding organs. The patient's questions were answered.  We are ready to proceed. The likelihood of the patient achieving the goals of this procedure is very likely.   The patient has previously said that she is allergic to penicillin. There was an occasion where the patient received 3 monthly shots of intramuscular penicillin. At the end of the 3 month period, she was evaluated by a nurse at her work who thought she may have hives. The nurse told the patient that she may be allergic to penicillin. The patient has taken amoxicillin and ampicillin in the past without difficulty. I believe that a cephalosporin is the appropriate preoperative antibiotic in this circumstance.  The patient now says that she wishes to have an anterior colporrhaphy for stress incontinence. The patient does have a known cystocele. The risk and benefits of this procedure were reviewed. We specifically talked about the risk of urinary retention and the possible need for catheterization.  BP 139/70  Pulse 74  Temp(Src) 97.7 F (36.5 C) (Oral)  Resp 20  SpO2 99%  CBC    Component Value Date/Time   WBC 7.9 11/03/2012 0835   RBC 3.14* 11/03/2012 0835   HGB 6.4* 11/03/2012 0835   HGB 8.0* 07/29/2012 0909   HCT 22.7* 11/03/2012 0835   PLT 279 11/03/2012 0835   MCV 72.3* 11/03/2012 0835   MCH 20.4* 11/03/2012 0835   MCHC 28.2* 11/03/2012 0835   RDW 22.4* 11/03/2012 0835   LYMPHSABS 2.5 02/07/2007 1629   MONOABS 2.2* 02/07/2007 1629   EOSABS 0.0 02/07/2007 1629   BASOSABS 0.0 02/07/2007 1629    We will type and cross 2 units of packed red blood cells.  Leonard Schwartz, M.D.

## 2012-11-03 NOTE — Op Note (Signed)
OPERATIVE NOTE  Julia Dunn  DOB:    [birth year]  MRN:    [id number]  CSN:    [id number]  Date of Surgery:  11/03/2012  Preoperative Diagnosis:  Menorrhagia  Fibroids  Anemia (hemoglobin 6.4)  Obesity (BMI 37.7)  Pelvic relaxation with a cystocele and loss of the urethrovesical angle  Stress urinary incontinence  Hypertension  Diabetes  Probable sleep apnea  Postoperative Diagnosis:  Same  Procedure:  Diagnostic laparoscopy  Total of a hysterectomy  Bilateral salpingectomy  Burch cystourethropexy  Cystoscopy  Surgeon:  Leonard Schwartz, M.D.  Assistant:  Henreitta Leber, PA-C  Anesthetic:  General  Disposition:  The patient presents with the above-mentioned history. Her hemoglobin has been as low as 4. Based on the patient's history, the anesthesia Department feel that the patient likely has sleep apnea. They would like to minimize her narcotic use if possible. She understands the indications for her surgical procedure. She understands that, because of her large fibroids, we may not be able to perform the laparoscopy assisted vaginal hysterectomy that we anticipate. She has given [place] permission to proceed with an abdominal approach. She understands the indications associated with her procedure and the risk of, but not limited to, anesthetic complications, bleeding, infections, possible damage to the surrounding organs, and the need for catheterization.  Findings:  A 650 g multi-fibroid uterus was present. The uterus was very broad and there was difficulty assessing the adnexa. Mobility was limited. The patient may not tolerate steep Trendelenburg. The fallopian tubes were normal except for defects from her prior tubal sterilization. The ovaries appeared normal. There were a few filmy adhesions in the posterior cul-de-sac. The abdominal contents were normal except for excessive adipose tissue. The patient had a cystocele with loss of the urethrovesical  angle. On cystoscopy, blue dye quickly passed through both ureteral orifices. No defects were noted in the bladder mucosa.   Procedure:  The patient was taken to the operating room where a general anesthetic was given. The patient's abdomen was prepped with multiple layers of DuraPrep. The perineum and vagina were prepped with Betadine. A Foley catheter was placed in the bladder. Examination under anesthesia was performed. A Hulka tenaculum was placed inside the uterus. The patient was sterilely draped. The subumbilical area was injected with 3 cc of half percent Marcaine with epinephrine. An incision was made and the Verres needle was inserted into the abdominal cavity without difficulty. Proper placement was confirmed using the saline drop test. The laparoscopic trocar and the laparoscope were substituted for the Verres needle. The pelvic contents were visualized with findings as mentioned above. The uterus was noted to be non-mobile. The broad fundus of the uterus filled the pelvis. We had difficulty assessing the adnexa. 3 cc of half percent Marcaine with epinephrine were injected in the left upper quadrant. An incision was made and a 5 mm trocar was placed into the abdominal cavity under direct visualization. Hemostasis was adequate. The decision was made that it was most appropriate to complete the surgery via the abdominal approach. The instruments were removed. The fascia at the subumbilical incision was closed using interrupted sutures of 0 Vicryl. Both skin incisions were closed using 3-0 Monocryl. A low transverse incision was made in the abdomen and carried sharply through the subcutaneous tissue, the fascia, and the anterior peritoneum. An abdominal wall retractor was placed. The bowel was packed cephalad. The uterus was elevated into our operative field. The upper pedicles were clamped and cut. The upper pedicles were  sutured, tied, and held to the side. Alternating from left to right the remainder  of the upper pedicles and uterine arteries were clamped, cut, sutured, and tied securely. The bladder flap was developed anteriorly and pushed away from the anterior uterus. Again, alternating from left to right the parametrial tissues, paracervical tissues, uterosacral ligaments, and vaginal angles were clamped, cut, sutured, and tied securely. The apex of the vagina was transected from the cervix. The uterus including the cervix were sent to pathology. The apex of the vagina was closed using figure-of-eight sutures. The uterosacral ligaments were tied to the vaginal angles. We then identified the left fallopian tube. The mesosalpinx was clamped and then cut. The mesosalpinx was tied. An identical procedure was carried out on the opposite side. The pelvis was vigorously irrigated. Hemostasis was confirmed throughout. All instruments were removed. The anterior peritoneum was closed using a running suture. We then entered the space of Retzius. The operator put his left hand in the vagina and elevated the bladder and the vaginal mucosa. 2 sutures of 0 PDS were placed in the fascia of the vagina to the right of the urethra. The most cephalad suture was placed near the urethrovesical junction. An identical procedure was carried out to the left of the urethra. The operator then changed gloves. The bladder was filled with sterile milk. No milk leaked into the operative field. The sutures were then attached to Cooper's ligaments. The assistant elevated the vaginal mucosa and the sutures were tied (but not overly tighten). Again hemostasis was confirmed. The fascia was closed using a running suture followed by 3 interrupted sutures. 20 cc of extended release Marcaine were injected into the fascia and then placed in the subcutaneous space. The subcutaneous space had adequate hemostasis. The subcutaneous space was closed using interrupted sutures. The skin was reapproximated using a running suture of 3-0 Monocryl. Sponge,  needle, and isthmic counts were correct on 2 occasions. The estimated blood loss was 300 cc. The patient tolerated her procedure well. 0 Vicryl was the suture material used except where otherwise mentioned. The Foley catheter was removed and the cystoscope was inserted. The patient was given an ampule of indigo carmine dye. Blue dye quickly passed through the ureteral orifices. No damage was noted to the bladder mucosa. The cystoscope was removed and the Foley catheter was reinserted under sterile conditions. The patient was returned to the supine position. She was awakened from her anesthetic. She was transported to the recovery room in stable condition. She was noted to drain blue tinged urine. The uterus, and fallopian tubes were sent to pathology.  Leonard Schwartz, M.D.

## 2012-11-03 NOTE — Anesthesia Postprocedure Evaluation (Signed)
  Anesthesia Post-op Note  Patient: Julia Dunn  Procedure(s) Performed: Procedure(s): LAPAROSCOPY DIAGNOSTIC (N/A) HYSTERECTOMY ABDOMINAL (N/A) BILATERAL SALPINGECTOMY (Bilateral) BURCH PROCEDURE (N/A) CYSTOSCOPY (N/A) Patient is awake and responsive. Pain and nausea are reasonably well controlled. Vital signs are stable and clinically acceptable. Oxygen saturation is clinically acceptable. There are no apparent anesthetic complications at this time. Patient is ready for discharge.

## 2012-11-03 NOTE — Anesthesia Postprocedure Evaluation (Signed)
  Anesthesia Post-op Note  Patient: Julia Dunn  Procedure(s) Performed: Procedure(s): LAPAROSCOPY DIAGNOSTIC (N/A) HYSTERECTOMY ABDOMINAL (N/A) BILATERAL SALPINGECTOMY (Bilateral) BURCH PROCEDURE (N/A) CYSTOSCOPY (N/A)  Patient Location: PACU and Women[facility] Unit  Anesthesia Type:General  Level of Consciousness: awake, alert , oriented and patient cooperative  Airway and Oxygen Therapy: Patient Spontanous Breathing  Post-op Pain: none  Post-op Assessment: Post-op Vital signs reviewed, Patient's Cardiovascular Status Stable and Respiratory Function Stable  Post-op Vital Signs: Reviewed and stable  Complications: No apparent anesthesia complications
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
## 2012-11-03 NOTE — Transfer of Care (Signed)
Immediate Anesthesia Transfer of Care Note  Patient: Julia Dunn  Procedure(s) Performed: Procedure(s): LAPAROSCOPY DIAGNOSTIC (N/A) HYSTERECTOMY ABDOMINAL (N/A) BILATERAL SALPINGECTOMY (Bilateral) BURCH PROCEDURE (N/A) CYSTOSCOPY (N/A)  Patient Location: PACU  Anesthesia Type:General  Level of Consciousness: awake  Airway & Oxygen Therapy: Patient Spontanous Breathing  Post-op Assessment: Report given to PACU RN  Post vital signs: stable  Filed Vitals:   11/03/12 0839  BP: 139/70  Pulse: 74  Temp: 36.5 C  Resp: 20    Complications: No apparent anesthesia complications

## 2012-11-04 ENCOUNTER — Encounter (HOSPITAL_COMMUNITY): Payer: Self-pay | Admitting: Obstetrics and Gynecology

## 2012-11-04 LAB — BASIC METABOLIC PANEL
Calcium: 8.4 mg/dL (ref 8.4–10.5)
Chloride: 103 mEq/L (ref 96–112)
Creatinine, Ser: 0.74 mg/dL (ref 0.50–1.10)
GFR calc Af Amer: >90 mL/min (ref >90)
Sodium: 137 mEq/L (ref 135–145)

## 2012-11-04 LAB — CBC
Hemoglobin: 5.5 g/dL — CL (ref 12.0–15.0)
MCHC: 27.6 g/dL — ABNORMAL LOW (ref 30.0–36.0)
RDW: 22.7 % — ABNORMAL HIGH (ref 11.5–15.5)
WBC: 12.5 10*3/uL — ABNORMAL HIGH (ref 4.0–10.5)

## 2012-11-04 LAB — GLUCOSE, CAPILLARY
Glucose-Capillary: 61 mg/dL — ABNORMAL LOW (ref 70–99)
Glucose-Capillary: 83 mg/dL (ref 70–99)

## 2012-11-04 NOTE — Progress Notes (Signed)
Glenora Morocho is a59 y.o.  [id number]  Post Op Date # 1:  Diagnostic Laparoscopy/Total Abdominal Hysterectomy/Bilateral Salpingectomy  Subjective: Patient is Doing well postoperatively. Patient has Pain is controlled with current analgesics. Medications being used: prescription NSAID's including Toradol and narcotic analgesics including PCA Dilaudid., Mild dizziness with ambulation, tolerating liquids but hasn't voided or passed flatus yet.  Objective: Vital signs in last 24 hours: Temp:  [97 F (36.1 C)-98.3 F (36.8 C)] 97.8 F (36.6 C) (08/07 0537) Pulse Rate:  [62-95] 78 (08/07 0537) Resp:  [16-22] 20 (08/07 0537) BP: (128-166)/(70-97) 128/77 mmHg (08/07 0537) SpO2:  [94 %-100 %] 100 % (08/07 0537) Weight:  [226 lb (102.513 kg)] 226 lb (102.513 kg) (08/06 1600)  Intake/Output from previous day: 08/06 0701 - 08/07 0700 In: 4420 [P.O.:120; I.V.:4300] Out: 2175 [Urine:1875] Intake/Output this shift:    Recent Labs Lab 11/03/12 0835 11/03/12 1430 11/04/12 0515  WBC 7.9  --  12.5*  HGB 6.4* 6.0* 5.5*  HCT 22.7* 21.0* 19.9*  PLT 279  --  220     Recent Labs Lab 11/03/12 0835 11/04/12 0515  NA 137 137  K 3.9 3.5  CL 103 103  CO2 24 27  BUN 9 7  CREATININE 0.81 0.74  CALCIUM 8.9 8.4  GLUCOSE 156* 71    EXAM: General: alert, cooperative, fatigued and no distress Resp: clear to auscultation bilaterally Cardio: RRR GI: Soft, decreased bowel sounds, wound dressings are clean, dry and intact. Extremities: SCD hose in place and functioning, no calf tenderness   Assessment: s/p Procedure(s): LAPAROSCOPY DIAGNOSTIC HYSTERECTOMY ABDOMINAL BILATERAL SALPINGECTOMY BURCH PROCEDURE CYSTOSCOPY: stable  Plan: Encourage ambulation Patient interested in transfusion-will advise Dr. Stefano Gaul To begin twice daily CBG before breakfast and evening meals Routine Care  LOS: 1 day    Trequan Marsolek, PA-C 11/04/2012 7:50 AM

## 2012-11-04 NOTE — Progress Notes (Signed)
PROGRESS NOTE  I have reviewed the patient's vital signs, labs, and notes. I have examined the patient. I agree with the previous note from the physician assistant.  Leonard Schwartz, M.D. 11/04/2012

## 2012-11-05 LAB — TYPE AND SCREEN: Antibody Screen: NEGATIVE

## 2012-11-05 MED ORDER — GLYBURIDE 5 MG PO TABS: 10.0000 mg | ORAL_TABLET | Freq: Every day | ORAL | Status: DC

## 2012-11-05 MED ORDER — IBUPROFEN 600 MG PO TABS: ORAL_TABLET | ORAL | Status: DC

## 2012-11-05 MED ORDER — ONDANSETRON HCL 4 MG PO TABS: 4.0000 mg | ORAL_TABLET | Freq: Three times a day (TID) | ORAL | Status: DC | PRN

## 2012-11-05 MED ORDER — OXYCODONE-ACETAMINOPHEN 5-325 MG PO TABS: 1.0000 | ORAL_TABLET | ORAL | Status: DC | PRN

## 2012-11-05 NOTE — Progress Notes (Signed)
Julia Dunn is a31 y.o.  [id number]  Post Op Date # 2:  TAH/BS/Cystoscopy Subjective: Patient is Doing well postoperatively. Patient has The patient is not having any pain., Feeling more energetic since transfusion. tolerating a regular diet, voiding with maximum post void residual = 38 cc and reports good pain control with oral analgesia.  Objective: Vital signs in last 24 hours: Temp:  [98 F (36.7 C)-98.9 F (37.2 C)] 98 F (36.7 C) (08/08 0531) Pulse Rate:  [69-92] 69 (08/08 0531) Resp:  [16-20] 18 (08/08 0531) BP: (124-164)/(60-90) 150/86 mmHg (08/08 0531) SpO2:  [98 %-100 %] 100 % (08/08 0531)  Intake/Output from previous day: 08/07 0701 - 08/08 0700 In: 2537.5 [P.O.:350; I.V.:1000] Out: 2870 [Urine:2870] Intake/Output this shift:    Recent Labs Lab 11/03/12 0835 11/03/12 1430 11/04/12 0515  WBC 7.9  --  12.5*  HGB 6.4* 6.0* 5.5*  HCT 22.7* 21.0* 19.9*  PLT 279  --  220     Recent Labs Lab 11/03/12 0835 11/04/12 0515  NA 137 137  K 3.9 3.5  CL 103 103  CO2 24 27  BUN 9 7  CREATININE 0.81 0.74  CALCIUM 8.9 8.4  GLUCOSE 156* 71    EXAM: General: alert, cooperative and no distress Resp: clear to auscultation bilaterally Cardio: regular rate and rhythm, S1, S2 normal, no murmur, click, rub or gallop GI: soft, non-tender; bowel sounds normal; no masses,  no organomegaly Extremities: Homans sign is negative, no sign of DVT Vaginal Bleeding: none Abdominal dressing with dried faint stain on left side   Assessment: s/p Procedure(s): LAPAROSCOPY DIAGNOSTIC HYSTERECTOMY ABDOMINAL BILATERAL SALPINGECTOMY BURCH PROCEDURE CYSTOSCOPY: stable, progressing well and anemia  Plan: Discharge home  LOS: 2 days    Majestic Brister, PA-C 11/05/2012 8:32 AM

## 2012-11-05 NOTE — Progress Notes (Signed)
PROGRESS NOTE  I have reviewed the patient's vital signs, labs, and notes. I have examined the patient. I agree with the previous note from the physician assistant.  Leonard Schwartz, M.D. 11/05/2012

## 2012-11-05 NOTE — Discharge Summary (Signed)
  Physician Discharge Summary  Patient ID: Julia Dunn MRN: [id number] DOB/AGE: 08-25-1970 42 y.o.  Admit date: 11/03/2012 Discharge date: 11/05/2012  Admission diagnosis:  Menorrhagia  Fibroids  Anemia  Hypertension  Diabetes  Obesity  Pelvic relaxation with stress urinary incontinence  Depression  Discharge Diagnoses:   Same  Operation: Diagnostic Laparoscopy, Total Abdominal Hysterectomy, Bilateral Salpingectomy,   Burch Procedure and Cystoscopy   Discharged Condition:  Stable  Hospital Course: On the date of admission the patient underwent the aforementioned procedures and tolerated them well.  The uterus was too large and too broad for the laparoscopic approach. The abdominal approach was felt to be most appropriate. Operative findings included a multi-fibroid 650 g uterus. The ovaries appeared normal. Her preoperative hemoglobin was 6.4.  Her postoperative hemoglobin was 5.5. The patient was symptomatic. She was given a transfusion of 2 units of packed red blood cells for symptomatic anemia.  Her symptoms resolved. The patient was able to void adequately and her post void residuals were acceptable. By post operative day # 2, she had resumed bowel and bladder function, and was tolerant of a hemoglobin of 6.7.   Therefore, she was deemed ready for discharge home.   Disposition: 01-Home or Self Care  Discharge Medications:    Medication List    STOP taking these medications       acetaminophen-codeine 300-30 MG per tablet  Commonly known as:  TYLENOL #3      TAKE these medications       ferrous sulfate 325 (65 FE) MG tablet  Take 1 tablet (325 mg total) by mouth 3 (three) times daily with meals.     FLUoxetine 20 MG tablet  Commonly known as:  PROZAC  Take 1 tablet (20 mg total) by mouth daily.     glyBURIDE 5 MG tablet  Commonly known as:  DIABETA  Take 2 tablets (10 mg total) by mouth daily at 6 (six) AM.     ibuprofen 600 MG tablet  Commonly known as:   ADVIL,MOTRIN  1 po pc every 6 hours for 5 days then as needed for pain.     lisinopril 10 MG tablet  Commonly known as:  PRINIVIL,ZESTRIL  Take 10 mg by mouth daily.     ondansetron 4 MG tablet  Commonly known as:  ZOFRAN  Take 1 tablet (4 mg total) by mouth every 8 (eight) hours as needed.     ONE TOUCH TEST STRIPS test strip  Generic drug:  glucose blood  as directed. (Onetouch Ultra)     oxyCODONE-acetaminophen 5-325 MG per tablet  Commonly known as:  PERCOCET/ROXICET  Take 1-2 tablets by mouth every 4 (four) hours as needed for pain.         Final pathology report:   Benign fibroids and fallopian tubes  Follow-up: Dr. Kirkland Hun,  December 20, 2012 at 10:15 a.m.   SignedHenreitta Leber, PA-C 11/05/2012, 8:42 AM

## 2012-11-05 NOTE — Progress Notes (Signed)
Hgb 6.7, notified E. Lowell Guitar, PA no new orders. Cont to assess.

## 2012-11-13 ENCOUNTER — Inpatient Hospital Stay (HOSPITAL_COMMUNITY)
Admission: AD | Admit: 2012-11-13 | Discharge: 2012-11-13 | Disposition: A | Discharge status: Home / Self Care | Payer: 59 | Source: Ambulatory Visit | Attending: Obstetrics and Gynecology | Admitting: Obstetrics and Gynecology

## 2012-11-13 ENCOUNTER — Telehealth: Payer: Self-pay | Admitting: Family Medicine

## 2012-11-13 ENCOUNTER — Encounter (HOSPITAL_COMMUNITY): Payer: Self-pay | Admitting: Obstetrics and Gynecology

## 2012-11-13 DIAGNOSIS — I10 Essential (primary) hypertension: Secondary | ICD-10-CM | POA: Insufficient documentation

## 2012-11-13 DIAGNOSIS — IMO0002 Reserved for concepts with insufficient information to code with codable children: Secondary | ICD-10-CM | POA: Insufficient documentation

## 2012-11-13 DIAGNOSIS — E119 Type 2 diabetes mellitus without complications: Secondary | ICD-10-CM | POA: Insufficient documentation

## 2012-11-13 DIAGNOSIS — T8189XA Other complications of procedures, not elsewhere classified, initial encounter: Secondary | ICD-10-CM

## 2012-11-13 NOTE — MAU Note (Signed)
Pt stated she thinks her incision is infected. Noticed an odor and thinks it is opening up. Possible drainage.

## 2012-11-13 NOTE — MAU Provider Note (Signed)
History    CSN: [id number]  Arrival date and time: 11/13/12 7829    Chief Complaint  Patient presents with  . Wound Check   HPI Pt presents unannounced to MAU with c/o of possible infection of her incision.  Pt s/p abdominal hysterectomy with Burch procedure, and bilateral salpingectomy on 11/03/12.  Pt states she noted an odor today when she lifted her pannus.  States she thinks she may have had some drainage of her incision as well.  Denies any increased pain of the incision.  She also denies fever.  She reports she is ambulating and voiding without difficulty.  She has experienced some mild constipation.  She denies any vaginal bleeding as well.  She reports her pain is well controlled with motrin and percocet.  She denies any other issues at present.   Past Medical History  Diagnosis Date  . Hypertension   . Asthma   . Anemia   . History of blood transfusion   . Bacterial vaginosis 2008  . Headache(784.0)     migraines  . Hx of blood clots   . Pregnancy induced hypertension   . Depression   . Depression   . Postpartum depression   . Trichomonas   . Diabetes mellitus     no meds- diet controlled  . Gestational diabetes   . Kidney infection     Past Surgical History  Procedure Laterality Date  . Tubal ligation    . Dilation and curettage of uterus    . Laparoscopy N/A 11/03/2012    Procedure: LAPAROSCOPY DIAGNOSTIC;  Surgeon: Kirkland Hun, MD;  Location: [facility];  Service: Gynecology;  Laterality: N/A;  . Abdominal hysterectomy N/A 11/03/2012    Procedure: HYSTERECTOMY ABDOMINAL;  Surgeon: Kirkland Hun, MD;  Location: [facility];  Service: Gynecology;  Laterality: N/A;  . Bilateral salpingectomy Bilateral 11/03/2012    Procedure: BILATERAL SALPINGECTOMY;  Surgeon: Kirkland Hun, MD;  Location: [facility];  Service: Gynecology;  Laterality: Bilateral;  . Burch procedure N/A 11/03/2012    Procedure: BURCH PROCEDURE;  Surgeon: Kirkland Hun, MD;  Location: [facility];  Service:  Gynecology;  Laterality: N/A;  . Cystoscopy N/A 11/03/2012    Procedure: CYSTOSCOPY;  Surgeon: Kirkland Hun, MD;  Location: [facility];  Service: Gynecology;  Laterality: N/A;    Family History  Problem Relation Age of Onset  . Hypertension Father   . Heart disease Mother   . Rheumatic fever Mother   . Hypertension Mother   . Stroke Mother   . Hypertension Brother   . Stroke Brother   . Heart disease Paternal Aunt   . Sickle cell trait Daughter     History  Substance Use Topics  . Smoking status: Never Smoker   . Smokeless tobacco: Never Used  . Alcohol Use: Yes     Comment: SOCIAL    Allergies:  Allergies  Allergen Reactions  . Other Swelling    Patient says she is unsure of caused the swelling but one of the meds given to her on the 8/6 for her surgery had her extreamly swollen   . Penicillins Hives    REACTION: unspecified    Prescriptions prior to admission  Medication Sig Dispense Refill  . ferrous sulfate 325 (65 FE) MG tablet Take 1 tablet (325 mg total) by mouth 3 (three) times daily with meals.  90 tablet  11  . FLUoxetine (PROZAC) 20 MG tablet Take 1 tablet (20 mg total) by mouth daily.  30 tablet  3  .  glucose blood (ONE TOUCH TEST STRIPS) test strip 1 each by Other route as directed. (Onetouch Ultra)      . glyBURIDE (DIABETA) 5 MG tablet Take 10 mg by mouth daily at 6 (six) AM.      . ibuprofen (ADVIL,MOTRIN) 600 MG tablet Take 600 mg by mouth every 6 (six) hours as needed for pain. 1 po pc every 6 hours for 5 days then as needed for pain.      Marland Kitchen lisinopril (PRINIVIL,ZESTRIL) 10 MG tablet Take 10 mg by mouth at bedtime.       Marland Kitchen OVER THE COUNTER MEDICATION Take 2 tablets by mouth daily as needed (constipation).      Marland Kitchen oxyCODONE-acetaminophen (PERCOCET/ROXICET) 5-325 MG per tablet Take 1-2 tablets by mouth every 4 (four) hours as needed for pain.      . [DISCONTINUED] glyBURIDE (DIABETA) 5 MG tablet Take 2 tablets (10 mg total) by mouth daily at 6 (six) AM.  60  tablet  11  . [DISCONTINUED] ibuprofen (ADVIL,MOTRIN) 600 MG tablet 1 po pc every 6 hours for 5 days then as needed for pain.  30 tablet  2  . [DISCONTINUED] oxyCODONE-acetaminophen (PERCOCET/ROXICET) 5-325 MG per tablet Take 1-2 tablets by mouth every 4 (four) hours as needed for pain.  30 tablet  0    Review of Systems  Constitutional: Negative.   HENT: Negative.   Eyes: Negative.   Respiratory: Negative.   Cardiovascular: Negative.   Gastrointestinal: Negative.   Genitourinary: Negative.   Musculoskeletal: Negative.   Skin: Negative.   Neurological: Negative.   Endo/Heme/Allergies: Negative.   Psychiatric/Behavioral: Negative.    Physical Exam   Blood pressure 174/95, pulse 60, temperature 98.2 F (36.8 C), temperature source Oral, resp. rate 18, height 5\' 5"  (1.651 m), weight 104.237 kg (229 lb 12.8 oz), last menstrual period 06/29/2012.  Physical Exam  Constitutional: She is oriented to person, place, and time. She appears well-developed and well-nourished.  HENT:  Head: Normocephalic and atraumatic.  Right Ear: External ear normal.  Left Ear: External ear normal.  Nose: Nose normal.  Eyes: Pupils are equal, round, and reactive to light.  Neck: Normal range of motion. Neck supple.  Cardiovascular: Normal rate, regular rhythm and intact distal pulses.   Respiratory: Effort normal and breath sounds normal.  GI: Soft. Bowel sounds are normal. She exhibits no distension. There is no tenderness. There is no rebound and no guarding.  Pfannenstiel incision noted to be intact without redness or drainage noted on pantiliner that pt had against her incision.  Incision is soft.  No remarkable tenderness present on palpation of incision.  Usual body odor present upon lifting of pannus.   Genitourinary:  Deferred  Musculoskeletal: Normal range of motion. She exhibits no edema.  Neurological: She is alert and oriented to person, place, and time. She has normal reflexes.  Skin: Skin is  warm and dry.  Psychiatric: She has a normal mood and affect. Her behavior is normal.    MAU Course  Procedures    Assessment and Plan  Incision check - s/p abdominal hysterectomy 11/03/12 Hx chronic hypertension Hx Type II Diabetes  D/W pt that her incision is intact and appears to be healing appropriately at this time. Incision care d/w pt and info given.   Pt to f/u as sched at [facility] on 11/23/12 with Dr. Stefano Gaul.    Lesley Galentine O. 11/13/2012, 11:49 AM

## 2012-11-13 NOTE — Telephone Encounter (Signed)
Pt called the [facility] emergency line at 0900. Pt states she had a hysterectomy on 8/6 by Dr. Stefano Gaul (her regular OBGYN) and took a shower today due to noticing she has had a "strong odor" from the area. Pt describes that she is overweight with a "very low scar" from the procedure. Pt has noted strong odor for the past couple of days that returns within minutes of showering, more than once. Pt states she feels like her scar is open; she states she sees "pink and then skin, then pink and then skin." Pt has no drainage from the area and has been using a pantyliner to . Pt is in no pain and has no burning when she urinates. No fevers, no vomiting, though endorses some abdominal pain and flatulence. Advised pt to proceed to [facility] to be evaluated for possible wound dehiscence. Pt expressed thanks and understanding.

## 2013-03-07 ENCOUNTER — Telehealth: Payer: Self-pay | Admitting: Family Medicine

## 2013-03-07 DIAGNOSIS — R928 Other abnormal and inconclusive findings on diagnostic imaging of breast: Secondary | ICD-10-CM

## 2013-03-07 NOTE — Telephone Encounter (Signed)
LM for pt to call back.  Please give message from Dr. Armen Pickup when she does.  Thanks [employer]

## 2013-03-07 NOTE — Telephone Encounter (Signed)
Please call patient and tell her the following:  This is a reminder that your mammogram last May revealed dense breast tissue on the R side.  It is harder to see abnormalities when the breat tissue is dense. For this reason, it is recommended that you have a  f/u R breast mammogram 6 months from the last mammogram.  Now is the time for this f/u mammogram. Dr. Armen Pickup ask that you do your best to have this f/u mammogram done before the end of the year.  Please call [facility] to schedule this at [phone].

## 2013-03-31 DIAGNOSIS — N159 Renal tubulo-interstitial disease, unspecified: Secondary | ICD-10-CM

## 2013-03-31 HISTORY — DX: Renal tubulo-interstitial disease, unspecified: N15.9

## 2013-05-31 ENCOUNTER — Ambulatory Visit (INDEPENDENT_AMBULATORY_CARE_PROVIDER_SITE_OTHER): Payer: 59 | Admitting: Family Medicine

## 2013-05-31 ENCOUNTER — Encounter: Payer: Self-pay | Admitting: Family Medicine

## 2013-05-31 VITALS — BP 180/118 | HR 85 | Temp 98.5°F | Ht 65.0 in | Wt 251.0 lb

## 2013-05-31 DIAGNOSIS — J301 Allergic rhinitis due to pollen: Secondary | ICD-10-CM

## 2013-05-31 DIAGNOSIS — E119 Type 2 diabetes mellitus without complications: Secondary | ICD-10-CM

## 2013-05-31 DIAGNOSIS — D649 Anemia, unspecified: Secondary | ICD-10-CM

## 2013-05-31 DIAGNOSIS — B351 Tinea unguium: Secondary | ICD-10-CM

## 2013-05-31 DIAGNOSIS — I10 Essential (primary) hypertension: Secondary | ICD-10-CM

## 2013-05-31 LAB — POCT HEMOGLOBIN: Hemoglobin: 13.9 g/dL (ref 12.2–16.2)

## 2013-05-31 LAB — POCT GLYCOSYLATED HEMOGLOBIN (HGB A1C): Hemoglobin A1C: 7.6

## 2013-05-31 MED ORDER — METFORMIN HCL 500 MG PO TABS: 500.0000 mg | ORAL_TABLET | Freq: Two times a day (BID) | ORAL | Status: DC

## 2013-05-31 MED ORDER — LISINOPRIL-HYDROCHLOROTHIAZIDE 20-12.5 MG PO TABS: 2.0000 | ORAL_TABLET | Freq: Every day | ORAL | Status: DC

## 2013-05-31 MED ORDER — TRIAMCINOLONE ACETONIDE 55 MCG/ACT NA AERO: 2.0000 | INHALATION_SPRAY | Freq: Every day | NASAL | Status: DC

## 2013-05-31 NOTE — Assessment & Plan Note (Signed)
A: declined. With elevated A1c P: Continue glyburide Restart metformin

## 2013-05-31 NOTE — Assessment & Plan Note (Signed)
Normal Hgb or recheck. Will resolve problem

## 2013-05-31 NOTE — Patient Instructions (Addendum)
Julia Dunn,  Thank you for coming in today. Please start new BP medication.  Your hemoglobin is doing well. Your A1c is elevated from before indicating higher blood sugars and insulin resistance. We can manage this with oral therapy. Start metformin 500 mg daily, work up to twice daily.   See nurse in one week for BP check.  See me in two weeks.  Dr. Adrian Blackwater

## 2013-05-31 NOTE — Assessment & Plan Note (Signed)
A: elevated in the setting of weight gain P: CMP Change from lisinopril 10 mg to prinzide 20-12.5 mg two tabs daily  F/u with RN in one week F.u with me in two weeks

## 2013-05-31 NOTE — Progress Notes (Signed)
   Subjective:    Patient ID: Julia Dunn, female    DOB: 1970/04/23, 43 y.o.   MRN: [id number]  HPI 43 yo F prestns   1. HTN: noted elevated today at the dentist 196/143 at 9:30 PM. Patient has gained weight due to inactivity mostly. She admits to sluggishness, headaches, vision changes, SOB. Also some chest pain at rest. Occurs once a week. Not associated with food. She denies smoking and excessive nicotine intake. She has been prescribed NSAIDs but is not taking them.   2. Obesity: gained weight. Is upset by the scale. Patient is a home with her 19 year old daughter who is also overweight. She reports that her main problem is activity. Patient has tried hydroxy cut and Garcinia weight loss supplements intermittently without results.  3. Diabetes type 2: complaint with glyburide. Fasting blood sugar was 109 this AM.  4. Anemia: history and related to menorrhagia. She is status post hysterectomy. She has not had vaginal bleeding since postop. She admits to excessive fatigue requiring a nap daily. She attributed this to her obesity.  Soc Hx: chronic non smoker  Review of Systems As per HPI     Objective:   Physical Exam BP 180/118  Pulse 85  Temp(Src) 98.5 F (36.9 C) (Oral)  Ht 5\' 5"  (1.651 m)  Wt 251 lb (113.853 kg)  BMI 41.77 kg/m2  LMP 06/29/2012 Wt Readings from Last 3 Encounters:  05/31/13 251 lb (113.853 kg)  11/13/12 229 lb 12.8 oz (104.237 kg)  11/03/12 226 lb (102.513 kg)   BP Readings from Last 3 Encounters:  05/31/13 180/118  11/13/12 174/95  11/05/12 145/87  General appearance: alert, cooperative and no distress Eyes: conjunctivae/corneas clear. PERRL, EOM's intact. Fundi benign. Nose: swollen nasal turbinates Throat: torus palanitus. Normal oropharynx.  Lungs: clear to auscultation bilaterally Heart: regular rate and rhythm, S1, S2 normal, no murmur, click, rub or gallop Extremities: extremities normal, atraumatic, no cyanosis or edema  Lab Results    Component Value Date   HGBA1C 7.6 05/31/2013   Lab Results  Component Value Date   HGB 13.9 05/31/2013       Assessment & Plan:

## 2013-05-31 NOTE — Assessment & Plan Note (Signed)
A: b.l great toe P:  CMP If normal LFTs will start lamisil

## 2013-05-31 NOTE — Assessment & Plan Note (Signed)
A: declined with weight gain P; Restart metformin  Low carb diet garcinia

## 2013-06-01 ENCOUNTER — Telehealth: Payer: Self-pay | Admitting: Family Medicine

## 2013-06-01 DIAGNOSIS — B351 Tinea unguium: Secondary | ICD-10-CM

## 2013-06-01 LAB — COMPREHENSIVE METABOLIC PANEL
ALK PHOS: 52 U/L (ref 39–117)
ALT: 11 U/L (ref 0–35)
AST: 11 U/L (ref 0–37)
Albumin: 3.8 g/dL (ref 3.5–5.2)
BILIRUBIN TOTAL: 0.4 mg/dL (ref 0.2–1.2)
BUN: 12 mg/dL (ref 6–23)
CO2: 27 meq/L (ref 19–32)
CREATININE: 0.88 mg/dL (ref 0.50–1.10)
Calcium: 8.8 mg/dL (ref 8.4–10.5)
Chloride: 103 mEq/L (ref 96–112)
GLUCOSE: 206 mg/dL — AB (ref 70–99)
Potassium: 3.9 mEq/L (ref 3.5–5.3)
Sodium: 139 mEq/L (ref 135–145)
Total Protein: 6.8 g/dL (ref 6.0–8.3)

## 2013-06-02 MED ORDER — TERBINAFINE HCL 250 MG PO TABS: 250.0000 mg | ORAL_TABLET | Freq: Every day | ORAL | Status: AC

## 2013-06-02 NOTE — Telephone Encounter (Signed)
Called patient Gave her results of blood work [place] to start terbinafine once daily for 6 weeks. Will repeat CMP and CBC in 6 weeks. Then complete course of terbinafine in 6 weeks.   Ideally we would also start her a statin to reduce CVA and CAD risk. Will wait until done with terbinafine course.  Patient had dental work today and is still numb, having some bleeding and swelling.  Has called her dentist for advice.

## 2013-08-09 ENCOUNTER — Telehealth: Payer: Self-pay | Admitting: Family Medicine

## 2013-08-09 DIAGNOSIS — G4733 Obstructive sleep apnea (adult) (pediatric): Secondary | ICD-10-CM

## 2013-08-09 DIAGNOSIS — I10 Essential (primary) hypertension: Secondary | ICD-10-CM

## 2013-08-09 NOTE — Telephone Encounter (Signed)
Needs refills for fluoxetine, turbinafine,  Blood pressure pill is not working-BP is still high Please advise

## 2013-08-10 MED ORDER — TERBINAFINE HCL 250 MG PO TABS: 250.0000 mg | ORAL_TABLET | Freq: Every day | ORAL | Status: AC

## 2013-08-10 MED ORDER — FLUOXETINE HCL 20 MG PO TABS
20.0000 mg | ORAL_TABLET | Freq: Every day | ORAL | Status: DC
Start: 2013-08-10 — End: 2014-01-01

## 2013-08-10 NOTE — Telephone Encounter (Signed)
Refilled terbinafine x 6 more weeks. Refilled zoloft. Patient needs BP f/u sounds like a third agent needs to be added, likely CCB vs K+ sparing diuretic. Will need to assess her to determine which one is best. Previsit BMP ordered. Please ask patient to have blood draw prior to visit.

## 2013-08-11 NOTE — Telephone Encounter (Signed)
Pt is scheduled for lab tomorrow and would like to have TSH (cpt 84443)  Dx 327.23 per ENT too.  Also she is still interested in Qsymia.  Vineet Kinney,CMA

## 2013-08-11 NOTE — Addendum Note (Signed)
Addended by: Boykin Nearing on: 08/11/2013 05:07 PM   Modules accepted: Orders

## 2013-08-11 NOTE — Telephone Encounter (Signed)
TSH ordered. We will discuss Qsymia at f/u.

## 2013-08-12 ENCOUNTER — Other Ambulatory Visit: Payer: 59

## 2013-09-26 ENCOUNTER — Telehealth: Payer: Self-pay | Admitting: *Deleted

## 2013-09-26 ENCOUNTER — Ambulatory Visit (INDEPENDENT_AMBULATORY_CARE_PROVIDER_SITE_OTHER): Payer: 59 | Admitting: *Deleted

## 2013-09-26 ENCOUNTER — Emergency Department (HOSPITAL_COMMUNITY): Payer: 59

## 2013-09-26 ENCOUNTER — Observation Stay (HOSPITAL_COMMUNITY)
Admission: EM | Admit: 2013-09-26 | Discharge: 2013-09-27 | Disposition: A | Discharge status: Home / Self Care | Payer: 59 | Attending: Family Medicine | Admitting: Family Medicine

## 2013-09-26 ENCOUNTER — Encounter (HOSPITAL_COMMUNITY): Payer: Self-pay | Admitting: Emergency Medicine

## 2013-09-26 ENCOUNTER — Other Ambulatory Visit: Payer: Self-pay

## 2013-09-26 VITALS — BP 150/100 | HR 74 | Temp 98.4°F | Wt 251.0 lb

## 2013-09-26 DIAGNOSIS — R0609 Other forms of dyspnea: Secondary | ICD-10-CM

## 2013-09-26 DIAGNOSIS — G43909 Migraine, unspecified, not intractable, without status migrainosus: Secondary | ICD-10-CM | POA: Insufficient documentation

## 2013-09-26 DIAGNOSIS — R002 Palpitations: Secondary | ICD-10-CM | POA: Insufficient documentation

## 2013-09-26 DIAGNOSIS — Z880 Allergy status to penicillin: Secondary | ICD-10-CM | POA: Insufficient documentation

## 2013-09-26 DIAGNOSIS — J45901 Unspecified asthma with (acute) exacerbation: Secondary | ICD-10-CM | POA: Insufficient documentation

## 2013-09-26 DIAGNOSIS — D649 Anemia, unspecified: Secondary | ICD-10-CM | POA: Insufficient documentation

## 2013-09-26 DIAGNOSIS — R918 Other nonspecific abnormal finding of lung field: Secondary | ICD-10-CM | POA: Insufficient documentation

## 2013-09-26 DIAGNOSIS — Z8619 Personal history of other infectious and parasitic diseases: Secondary | ICD-10-CM | POA: Insufficient documentation

## 2013-09-26 DIAGNOSIS — R079 Chest pain, unspecified: Secondary | ICD-10-CM | POA: Diagnosis present

## 2013-09-26 DIAGNOSIS — R0600 Dyspnea, unspecified: Secondary | ICD-10-CM

## 2013-09-26 DIAGNOSIS — N159 Renal tubulo-interstitial disease, unspecified: Secondary | ICD-10-CM | POA: Insufficient documentation

## 2013-09-26 DIAGNOSIS — R609 Edema, unspecified: Secondary | ICD-10-CM | POA: Insufficient documentation

## 2013-09-26 DIAGNOSIS — Z87448 Personal history of other diseases of urinary system: Secondary | ICD-10-CM | POA: Insufficient documentation

## 2013-09-26 DIAGNOSIS — F3289 Other specified depressive episodes: Secondary | ICD-10-CM | POA: Insufficient documentation

## 2013-09-26 DIAGNOSIS — Z136 Encounter for screening for cardiovascular disorders: Secondary | ICD-10-CM

## 2013-09-26 DIAGNOSIS — E119 Type 2 diabetes mellitus without complications: Secondary | ICD-10-CM | POA: Insufficient documentation

## 2013-09-26 DIAGNOSIS — Z79899 Other long term (current) drug therapy: Secondary | ICD-10-CM | POA: Insufficient documentation

## 2013-09-26 DIAGNOSIS — Z8742 Personal history of other diseases of the female genital tract: Secondary | ICD-10-CM | POA: Insufficient documentation

## 2013-09-26 DIAGNOSIS — R0789 Other chest pain: Principal | ICD-10-CM | POA: Insufficient documentation

## 2013-09-26 DIAGNOSIS — Z0130 Encounter for examination of blood pressure without abnormal findings: Secondary | ICD-10-CM

## 2013-09-26 DIAGNOSIS — Z86718 Personal history of other venous thrombosis and embolism: Secondary | ICD-10-CM | POA: Insufficient documentation

## 2013-09-26 DIAGNOSIS — R0989 Other specified symptoms and signs involving the circulatory and respiratory systems: Secondary | ICD-10-CM

## 2013-09-26 DIAGNOSIS — I10 Essential (primary) hypertension: Secondary | ICD-10-CM | POA: Insufficient documentation

## 2013-09-26 DIAGNOSIS — Z3202 Encounter for pregnancy test, result negative: Secondary | ICD-10-CM | POA: Insufficient documentation

## 2013-09-26 DIAGNOSIS — F329 Major depressive disorder, single episode, unspecified: Secondary | ICD-10-CM | POA: Insufficient documentation

## 2013-09-26 LAB — BASIC METABOLIC PANEL
BUN: 11 mg/dL (ref 6–23)
CHLORIDE: 104 meq/L (ref 96–112)
CO2: 20 meq/L (ref 19–32)
CREATININE: 0.71 mg/dL (ref 0.50–1.10)
Calcium: 8.9 mg/dL (ref 8.4–10.5)
GFR calc Af Amer: >90 mL/min (ref >90)
GFR calc non Af Amer: >90 mL/min (ref >90)
GLUCOSE: 143 mg/dL — AB (ref 70–99)
Potassium: 3.9 mEq/L (ref 3.7–5.3)
Sodium: 139 mEq/L (ref 137–147)

## 2013-09-26 LAB — CBC
HEMATOCRIT: 43.1 % (ref 36.0–46.0)
HEMOGLOBIN: 15.1 g/dL — AB (ref 12.0–15.0)
MCH: 30 pg (ref 26.0–34.0)
MCHC: 35 g/dL (ref 30.0–36.0)
MCV: 85.5 fL (ref 78.0–100.0)
Platelets: 211 10*3/uL (ref 150–400)
RBC: 5.04 MIL/uL (ref 3.87–5.11)
RDW: 12.9 % (ref 11.5–15.5)
WBC: 7.2 10*3/uL (ref 4.0–10.5)

## 2013-09-26 LAB — I-STAT TROPONIN, ED: TROPONIN I, POC: 0.01 ng/mL (ref 0.00–0.08)

## 2013-09-26 LAB — URINALYSIS, ROUTINE W REFLEX MICROSCOPIC
Bilirubin Urine: NEGATIVE
GLUCOSE, UA: NEGATIVE mg/dL
Hgb urine dipstick: NEGATIVE
Ketones, ur: NEGATIVE mg/dL
LEUKOCYTES UA: NEGATIVE
Nitrite: NEGATIVE
PH: 5.5 (ref 5.0–8.0)
PROTEIN: NEGATIVE mg/dL
SPECIFIC GRAVITY, URINE: 1.005 (ref 1.005–1.030)
Urobilinogen, UA: 0.2 mg/dL (ref 0.0–1.0)

## 2013-09-26 LAB — GLUCOSE, CAPILLARY: GLUCOSE-CAPILLARY: 94 mg/dL (ref 70–99)

## 2013-09-26 LAB — PREGNANCY, URINE: PREG TEST UR: NEGATIVE

## 2013-09-26 LAB — TROPONIN I

## 2013-09-26 LAB — D-DIMER, QUANTITATIVE: D-Dimer, Quant: 0.51 ug/mL-FEU — ABNORMAL HIGH (ref 0.00–0.48)

## 2013-09-26 MED ORDER — INSULIN ASPART 100 UNIT/ML ~~LOC~~ SOLN
0.0000 [IU] | Freq: Three times a day (TID) | SUBCUTANEOUS | Status: DC
  Administered 2013-09-27: 3 [IU] via SUBCUTANEOUS

## 2013-09-26 MED ORDER — INSULIN ASPART 100 UNIT/ML ~~LOC~~ SOLN: 0.0000 [IU] | Freq: Every day | SUBCUTANEOUS | Status: DC

## 2013-09-26 MED ORDER — FLUOXETINE HCL 20 MG PO TABS
20.0000 mg | ORAL_TABLET | Freq: Every day | ORAL | Status: DC
  Administered 2013-09-27: 20 mg via ORAL
  Filled 2013-09-26: qty 1

## 2013-09-26 MED ORDER — HYDRALAZINE HCL 20 MG/ML IJ SOLN
5.0000 mg | Freq: Four times a day (QID) | INTRAMUSCULAR | Status: DC | PRN
  Administered 2013-09-27: 5 mg via INTRAVENOUS
  Filled 2013-09-26: qty 1

## 2013-09-26 MED ORDER — NITROGLYCERIN 0.4 MG SL SUBL: 0.4000 mg | SUBLINGUAL_TABLET | SUBLINGUAL | Status: DC | PRN

## 2013-09-26 MED ORDER — IOHEXOL 350 MG/ML SOLN
100.0000 mL | Freq: Once | INTRAVENOUS | Status: AC | PRN
  Administered 2013-09-26: 100 mL via INTRAVENOUS

## 2013-09-26 MED ORDER — ASPIRIN 81 MG PO CHEW
324.0000 mg | CHEWABLE_TABLET | Freq: Once | ORAL | Status: AC
  Administered 2013-09-26: 324 mg via ORAL
  Filled 2013-09-26: qty 4

## 2013-09-26 MED ORDER — LISINOPRIL 40 MG PO TABS
40.0000 mg | ORAL_TABLET | Freq: Every day | ORAL | Status: DC
  Administered 2013-09-27: 40 mg via ORAL
  Filled 2013-09-26 (×2): qty 1

## 2013-09-26 MED ORDER — ACETAMINOPHEN 325 MG PO TABS
650.0000 mg | ORAL_TABLET | Freq: Four times a day (QID) | ORAL | Status: DC | PRN
  Administered 2013-09-26: 650 mg via ORAL
  Filled 2013-09-26: qty 2

## 2013-09-26 MED ORDER — SODIUM CHLORIDE 0.9 % IV BOLUS (SEPSIS)
1000.0000 mL | Freq: Once | INTRAVENOUS | Status: AC
  Administered 2013-09-26: 1000 mL via INTRAVENOUS

## 2013-09-26 MED ORDER — NITROGLYCERIN 0.4 MG SL SUBL
0.4000 mg | SUBLINGUAL_TABLET | SUBLINGUAL | Status: DC | PRN
  Administered 2013-09-26 (×2): 0.4 mg via SUBLINGUAL
  Filled 2013-09-26: qty 1

## 2013-09-26 MED ORDER — ONDANSETRON HCL 4 MG/2ML IJ SOLN: 4.0000 mg | Freq: Four times a day (QID) | INTRAMUSCULAR | Status: DC | PRN

## 2013-09-26 MED ORDER — HEPARIN SODIUM (PORCINE) 5000 UNIT/ML IJ SOLN
5000.0000 [IU] | Freq: Three times a day (TID) | INTRAMUSCULAR | Status: DC
  Filled 2013-09-26 (×3): qty 1

## 2013-09-26 MED ORDER — GLYBURIDE 5 MG PO TABS
10.0000 mg | ORAL_TABLET | Freq: Every day | ORAL | Status: DC
  Filled 2013-09-26: qty 2

## 2013-09-26 MED ORDER — HYDROCHLOROTHIAZIDE 25 MG PO TABS
25.0000 mg | ORAL_TABLET | Freq: Every day | ORAL | Status: DC
  Administered 2013-09-27: 25 mg via ORAL
  Filled 2013-09-26 (×2): qty 1

## 2013-09-26 MED ORDER — ASPIRIN EC 81 MG PO TBEC
81.0000 mg | DELAYED_RELEASE_TABLET | Freq: Every day | ORAL | Status: DC
  Administered 2013-09-27: 81 mg via ORAL
  Filled 2013-09-26: qty 1

## 2013-09-26 NOTE — ED Notes (Addendum)
Pt in c/o chest pain and shortness of breath, states that the last time she felt like this she was anemic, states that symptoms are worse with exertion, states the pain feels like a heaviness and it is worse when taking a deep breath, c/o fatigue and lack of appetite, symptoms have been going on since march but they have been getting worse recently. Also c/o headache.

## 2013-09-26 NOTE — ED Notes (Signed)
Report attempted 

## 2013-09-26 NOTE — Consult Note (Signed)
Cardiology Consultation Note  Patient ID: Julia Dunn, MRN: [id number], DOB/AGE: 43-Nov-1972 43 y.o. Admit date: 09/26/2013   Date of Consult: 09/26/2013 Primary Physician: Minerva Ends, MD Primary Cardiologist: nill   Chief Complaint: chest pain  Reason for Consult: chest pain    Assessment and Plan:  Precordial chest pain  HTN  DM type II   Plan  -Monitor on Telemetry  -Start aspirin , check lipids , Hg A1c  -Suspect pt has component of anxiety and SOB maybe in significant part from lung disease ( hx of asthma and abnormal CT chest ) . She does however have risk factors. Once she rules out for ACS with serial cardiac marker would conduct cardiac stress test   43 yr old female with hx of HTN ,DM  p/w chest pain   HPI: pt states that she has been having exertional SOB and chest discofort. The CP is described as substernal pins and pricks that occasionally radiates down both arms and associated with nausea , lightheadedness and anxiety. Pt was tearful while giving history.  Denies any  orthopnea, PND , LE edema , focal weakness, syncope, bleeding diathesis , claudication , palpitation etc .  Reports medication compliance  Past Medical History  Diagnosis Date  . Hypertension   . Asthma   . Anemia   . History of blood transfusion   . Bacterial vaginosis 2008  . Headache(784.0)     migraines  . Hx of blood clots   . Pregnancy induced hypertension   . Depression   . Depression   . Postpartum depression   . Trichomonas   . Diabetes mellitus     no meds- diet controlled  . Gestational diabetes   . Kidney infection        Surgical History:  Past Surgical History  Procedure Laterality Date  . Tubal ligation    . Dilation and curettage of uterus    . Laparoscopy N/A 11/03/2012    Procedure: LAPAROSCOPY DIAGNOSTIC;  Surgeon: Ena Dawley, MD;  Location: [facility];  Service: Gynecology;  Laterality: N/A;  . Abdominal hysterectomy N/A 11/03/2012    Procedure: HYSTERECTOMY  ABDOMINAL;  Surgeon: Ena Dawley, MD;  Location: [facility];  Service: Gynecology;  Laterality: N/A;  . Bilateral salpingectomy Bilateral 11/03/2012    Procedure: BILATERAL SALPINGECTOMY;  Surgeon: Ena Dawley, MD;  Location: [facility];  Service: Gynecology;  Laterality: Bilateral;  . Burch procedure N/A 11/03/2012    Procedure: BURCH PROCEDURE;  Surgeon: Ena Dawley, MD;  Location: [facility];  Service: Gynecology;  Laterality: N/A;  . Cystoscopy N/A 11/03/2012    Procedure: CYSTOSCOPY;  Surgeon: Ena Dawley, MD;  Location: [facility];  Service: Gynecology;  Laterality: N/A;     Home Meds: Prior to Admission medications   Medication Sig Start Date End Date Taking? Authorizing Maaz Spiering  FLUoxetine (PROZAC) 20 MG tablet Take 1 tablet (20 mg total) by mouth daily. 08/10/13  Yes Josalyn C Funches, MD  GARCINIA CAMBOGIA-CHROMIUM PO Take 1 tablet by mouth daily.   Yes Historical Theophil Thivierge, MD  glyBURIDE (DIABETA) 5 MG tablet Take 10 mg by mouth daily at 6 (six) AM. 11/05/12  Yes Elmira Powell, PA-C  lisinopril (PRINIVIL,ZESTRIL) 10 MG tablet Take 10 mg by mouth 2 (two) times daily. Patient states she will take lisinopril 10mg  when she runs out of the combo med (lisinopril/hctz).   Yes Historical Ethanael Veith, MD  lisinopril-hydrochlorothiazide (ZESTORETIC) 20-12.5 MG per tablet Take 2 tablets by mouth daily. 05/31/13  Yes Josalyn C Funches,  MD  metFORMIN (GLUCOPHAGE) 500 MG tablet Take 1 tablet (500 mg total) by mouth 2 (two) times daily with a meal. 05/31/13  Yes Josalyn C Funches, MD  ciprofloxacin (CIPRO) 500 MG tablet Take 500 mg by mouth See admin instructions. Take for 10days only patient completed course of medication back in March 2015 for swelling and congestion per patient    Historical Kebron Pulse, MD  glucose blood (ONE TOUCH TEST STRIPS) test strip 1 each by Other route as directed. (Onetouch Ultra)    Historical Haidyn Chadderdon, MD    Inpatient Medications:  . [START ON 09/27/2013] aspirin EC  81 mg Oral Daily   . [START ON 09/27/2013] FLUoxetine  20 mg Oral Daily  . [START ON 09/27/2013] heparin  5,000 Units Subcutaneous 3 times per day  . [START ON 09/27/2013] lisinopril  40 mg Oral Daily   And  . [START ON 09/27/2013] hydrochlorothiazide  25 mg Oral Daily  . [START ON 09/27/2013] insulin aspart  0-15 Units Subcutaneous TID WC  . insulin aspart  0-5 Units Subcutaneous QHS      Allergies:  Allergies  Allergen Reactions  . Other Swelling    Patient says she is unsure of caused the swelling but one of the meds given to her on the 8/6 for her surgery had her extreamly swollen   . Penicillins Hives    REACTION: unspecified    History   Social History  . Marital Status: Single    Spouse Name: N/A    Number of Children: N/A  . Years of Education: N/A   Occupational History  . Not on file.   Social History Main Topics  . Smoking status: Never Smoker   . Smokeless tobacco: Never Used  . Alcohol Use: Yes     Comment: SOCIAL  . Drug Use: No  . Sexual Activity: Not Currently    Birth Control/ Protection: Other-see comments     Comment: BTL   Other Topics Concern  . Not on file   Social History Narrative  . No narrative on file     Family History  Problem Relation Age of Onset  . Hypertension Father   . Heart disease Mother   . Rheumatic fever Mother   . Hypertension Mother   . Stroke Mother   . Hypertension Brother   . Stroke Brother   . Heart disease Paternal Aunt   . Sickle cell trait Daughter      Review of Systems: General: negative for chills, fever, night sweats or weight changes.  Cardiovascular: per HPI  Dermatological: negative for rash Respiratory: negative for cough or wheezing Urologic: negative for hematuria Abdominal: negative for nausea, vomiting, diarrhea, bright red blood per rectum, melena, or hematemesis Neurologic: negative for visual changes, syncope, or dizziness All other systems reviewed and are otherwise negative except as noted  above.  Labs:  Recent Labs  09/26/13 2233  TROPONINI <0.30   Lab Results  Component Value Date   WBC 7.2 09/26/2013   HGB 15.1* 09/26/2013   HCT 43.1 09/26/2013   MCV 85.5 09/26/2013   PLT 211 09/26/2013    Recent Labs Lab 09/26/13 1600  NA 139  K 3.9  CL 104  CO2 20  BUN 11  CREATININE 0.71  CALCIUM 8.9  GLUCOSE 143*   Lab Results  Component Value Date   CHOL 84 07/15/2012   HDL 37* 07/15/2012   LDLCALC 34 07/15/2012   TRIG 67 07/15/2012   Lab Results  Component Value Date  DDIMER 0.51* 09/26/2013    Radiology/Studies:  Dg Chest 2 View  09/26/2013   CLINICAL DATA:  43 year old female with central chest pain radiating to the right shoulder with shortness of Breath. Initial encounter.  EXAM: CHEST  2 VIEW  COMPARISON:  None.  FINDINGS: Cardiomegaly. On the lateral view left atrial enlargement is suggested. Other mediastinal contours are within normal limits. Visualized tracheal air column is within normal limits. Somewhat low lung volumes with crowding of markings. No pneumothorax, pleural effusion or consolidation. No definite pulmonary edema. Mild scoliosis. No acute osseous abnormality identified.  IMPRESSION: Cardiomegaly/left atrial enlargement suspected. No acute pulmonary process.   Electronically Signed   By: Lars Pinks M.D.   On: 09/26/2013 16:53   Ct Angio Chest Pe W/cm &/or Wo Cm  09/26/2013   CLINICAL DATA:  Chest pain  EXAM: CT ANGIOGRAPHY CHEST WITH CONTRAST  TECHNIQUE: Multidetector CT imaging of the chest was performed using the standard protocol during bolus administration of intravenous contrast. Multiplanar CT image reconstructions and MIPs were obtained to evaluate the vascular anatomy.  CONTRAST:  160mL OMNIPAQUE IOHEXOL 350 MG/ML SOLN  COMPARISON:  Chest x-ray September 26, 2013  FINDINGS: There is no pulmonary embolus. There is no mediastinal or hilar lymphadenopathy. The heart size is mildly enlarged. There are diffuse patchy ground-glass opacity throughout both  lungs. There is no pleural effusion. The visualized upper abdominal structures are normal. There is scoliosis of spine. No acute abnormalities identified within the bones.  Review of the MIP images confirms the above findings.  IMPRESSION: No pulmonary embolus.  Diffuse patchy ground-glass opacity throughout both lungs. This is nonspecific. Differential diagnosis includes but is not limited to viral infection, DIP, allergic hypersensitivity. Clinical correlation is recommended.   Electronically Signed   By: Abelardo Diesel M.D.   On: 09/26/2013 19:27    EKG: NSR, PRWP   Physical Exam: Blood pressure 184/109, pulse 62, temperature 98.2 F (36.8 C), temperature source Oral, resp. rate 20, height 5\' 5"  (1.651 m), weight 113.853 kg (251 lb), last menstrual period 06/29/2012, SpO2 98.00%. General: Well developed, well nourished, in no acute distress.  Neck: Negative for carotid bruits. JVD not elevated. Lungs: Clear bilaterally to auscultation without wheezes, rales, or rhonchi. Breathing is unlabored. Heart: RRR with S1 S2. No murmurs, rubs, or gallops appreciated. Abdomen: Soft, non-tender, non-distended with normoactive bowel sounds. No hepatomegaly. No rebound/guarding. No obvious abdominal masses. Extremities: No clubbing or cyanosis. No edema.  Distal pedal pulses are 2+ and equal bilaterally. Neuro: Alert and oriented X 3. No facial asymmetry. No focal deficit. Moves all extremities spontaneously. Psych:  Responds to questions appropriately with a normal affect.     Cory Roughen, A M.D  09/26/2013, 11:19 PM

## 2013-09-26 NOTE — ED Provider Notes (Signed)
Medical screening examination/treatment/procedure(s) were conducted as a shared visit with non-physician practitioner(s) and myself.  I personally evaluated the patient during the encounter.   EKG Interpretation   Date/Time:  Monday September 26 2013 15:48:47 EDT Ventricular Rate:  100 PR Interval:  154 QRS Duration: 84 QT Interval:  372 QTC Calculation: 479 R Axis:   65 Text Interpretation:  Normal sinus rhythm Septal infarct , age  undetermined Abnormal ECG No significant change since last tracing  Confirmed by POLLINA  MD, CHRISTOPHER [phone]) on 09/26/2013 8:58:36 PM      Presented to the ER for evaluation of chest pain and shortness of breath. This has been experiencing exertional chest pain and dyspnea. She had heaviness upon arrival which improved with nitroglycerin. She was very hypertensive upon arrival. Symptoms improved with nitroglycerin. Patient has multiple cardiac risk factors, will require hospitalization for further management.  Orpah Greek, MD 09/26/13 2059

## 2013-09-26 NOTE — ED Notes (Signed)
Dr. Pollina at bedside   

## 2013-09-26 NOTE — ED Provider Notes (Signed)
CSN: [id number]     Arrival date & time 09/26/13  1540 History   First MD Initiated Contact with Patient 09/26/13 1711     Chief Complaint  Patient presents with  . Chest Pain     (Consider location/radiation/quality/duration/timing/severity/associated sxs/prior Treatment) HPI Comments: The patient is a 43-year-old female past medical history of hypertension, asthma, presents emergency room chief complaint of chest pain and shortness of breath. The patient reports onset of chest pain for approximately 0200 Saturday morning during intercourse with associated shortness of breath. She reports symptoms were relieved with rest. She reports another episode yesterday during intercourse, once again had to stop, resolved with rest.  She reports central sharp, pen point, nonradiating chest discomfort rated 2/10 in the ED. The patient reports persistent dyspnea for several months, worsened with exertion.  Pt reports post tussive emesis. Denies personal history of MI. Reports multiple first-degree relatives with early MI. Denies history of clotting disorder, previous DVT, PE, recent travel, exogenous estrogen, leg swelling.  Reports intermittent compliance with HTN medication, last HCTZ 1 week ago. Patient's last menstrual period was 06/29/2012, history of hysterectomy. PCP: Minerva Ends, MD   The history is provided by the patient. No language interpreter was used.    Past Medical History  Diagnosis Date  . Hypertension   . Asthma   . Anemia   . History of blood transfusion   . Bacterial vaginosis 2008  . Headache(784.0)     migraines  . Hx of blood clots   . Pregnancy induced hypertension   . Depression   . Depression   . Postpartum depression   . Trichomonas   . Diabetes mellitus     no meds- diet controlled  . Gestational diabetes   . Kidney infection    Past Surgical History  Procedure Laterality Date  . Tubal ligation    . Dilation and curettage of uterus    . Laparoscopy N/A  11/03/2012    Procedure: LAPAROSCOPY DIAGNOSTIC;  Surgeon: Ena Dawley, MD;  Location: [facility];  Service: Gynecology;  Laterality: N/A;  . Abdominal hysterectomy N/A 11/03/2012    Procedure: HYSTERECTOMY ABDOMINAL;  Surgeon: Ena Dawley, MD;  Location: [facility];  Service: Gynecology;  Laterality: N/A;  . Bilateral salpingectomy Bilateral 11/03/2012    Procedure: BILATERAL SALPINGECTOMY;  Surgeon: Ena Dawley, MD;  Location: [facility];  Service: Gynecology;  Laterality: Bilateral;  . Burch procedure N/A 11/03/2012    Procedure: BURCH PROCEDURE;  Surgeon: Ena Dawley, MD;  Location: [facility];  Service: Gynecology;  Laterality: N/A;  . Cystoscopy N/A 11/03/2012    Procedure: CYSTOSCOPY;  Surgeon: Ena Dawley, MD;  Location: [facility];  Service: Gynecology;  Laterality: N/A;   Family History  Problem Relation Age of Onset  . Hypertension Father   . Heart disease Mother   . Rheumatic fever Mother   . Hypertension Mother   . Stroke Mother   . Hypertension Brother   . Stroke Brother   . Heart disease Paternal Aunt   . Sickle cell trait Daughter    History  Substance Use Topics  . Smoking status: Never Smoker   . Smokeless tobacco: Never Used  . Alcohol Use: Yes     Comment: SOCIAL   OB History   Grav Para Term Preterm Abortions TAB SAB Ect Mult Living   6 3 3  3     3      Review of Systems  Constitutional: Negative for fever, chills and diaphoresis.  Respiratory: Positive for  cough, chest tightness and shortness of breath.   Cardiovascular: Positive for chest pain and palpitations.  Gastrointestinal: Negative for nausea, abdominal pain and diarrhea.      Allergies  Other and Penicillins  Home Medications   Prior to Admission medications   Medication Sig Start Date End Date Taking? Authorizing Provider  FLUoxetine (PROZAC) 20 MG tablet Take 1 tablet (20 mg total) by mouth daily. 08/10/13  Yes Josalyn C Funches, MD  GARCINIA CAMBOGIA-CHROMIUM PO Take 1 tablet by mouth daily.    Yes Historical Provider, MD  glyBURIDE (DIABETA) 5 MG tablet Take 10 mg by mouth daily at 6 (six) AM. 11/05/12  Yes Elmira Powell, PA-C  lisinopril (PRINIVIL,ZESTRIL) 10 MG tablet Take 10 mg by mouth 2 (two) times daily. Patient states she will take lisinopril 10mg  when she runs out of the combo med (lisinopril/hctz).   Yes Historical Provider, MD  lisinopril-hydrochlorothiazide (ZESTORETIC) 20-12.5 MG per tablet Take 2 tablets by mouth daily. 05/31/13  Yes Josalyn C Funches, MD  metFORMIN (GLUCOPHAGE) 500 MG tablet Take 1 tablet (500 mg total) by mouth 2 (two) times daily with a meal. 05/31/13  Yes Josalyn C Funches, MD  ciprofloxacin (CIPRO) 500 MG tablet Take 500 mg by mouth See admin instructions. Take for 10days only patient completed course of medication back in March 2015 for swelling and congestion per patient    Historical Provider, MD  glucose blood (ONE TOUCH TEST STRIPS) test strip 1 each by Other route as directed. (Onetouch Ultra)    Historical Provider, MD   BP 182/116  Pulse 89  Resp 20  Wt 251 lb (113.853 kg)  SpO2 95%  LMP 06/29/2012 Physical Exam  Nursing note and vitals reviewed. Constitutional: She is oriented to person, place, and time. She appears well-developed and well-nourished.  Non-toxic appearance. She does not have a sickly appearance. She does not appear ill. No distress.  HENT:  Head: Normocephalic and atraumatic.  Eyes: EOM are normal. Pupils are equal, round, and reactive to light. Right eye exhibits no discharge. Left eye exhibits no discharge.  Neck: Normal range of motion. Neck supple.  Cardiovascular: Normal rate and regular rhythm.   Trace pitting edema bilaterally. No calf tenderness.  Pulmonary/Chest: Breath sounds normal. No accessory muscle usage. Tachypnea noted. No respiratory distress. She has no decreased breath sounds. She has no wheezes. She has no rhonchi. She has no rales.  RR:22 Patient is able to speak in complete sentences.   Abdominal: Soft.  There is no tenderness. There is no rebound and no guarding.  Obese abdomen  Musculoskeletal: Normal range of motion.  Neurological: She is alert and oriented to person, place, and time.  Skin: Skin is warm and dry. She is not diaphoretic.  Psychiatric: She has a normal mood and affect. Her behavior is normal.    ED Course  Procedures (including critical care time) Labs Review Results for orders placed during the hospital encounter of 09/26/13  CBC      Result Value Ref Range   WBC 7.2  4.0 - 10.5 K/uL   RBC 5.04  3.87 - 5.11 MIL/uL   Hemoglobin 15.1 (*) 12.0 - 15.0 g/dL   HCT 43.1  36.0 - 46.0 %   MCV 85.5  78.0 - 100.0 fL   MCH 30.0  26.0 - 34.0 pg   MCHC 35.0  30.0 - 36.0 g/dL   RDW 12.9  11.5 - 15.5 %   Platelets 211  150 - 400 K/uL  BASIC METABOLIC  PANEL      Result Value Ref Range   Sodium 139  137 - 147 mEq/L   Potassium 3.9  3.7 - 5.3 mEq/L   Chloride 104  96 - 112 mEq/L   CO2 20  19 - 32 mEq/L   Glucose, Bld 143 (*) 70 - 99 mg/dL   BUN 11  6 - 23 mg/dL   Creatinine, Ser 0.71  0.50 - 1.10 mg/dL   Calcium 8.9  8.4 - 10.5 mg/dL   GFR calc non Af Amer >90  >90 mL/min   GFR calc Af Amer >90  >90 mL/min  D-DIMER, QUANTITATIVE      Result Value Ref Range   D-Dimer, Quant 0.51 (*) 0.00 - 0.48 ug/mL-FEU  I-STAT TROPOININ, ED      Result Value Ref Range   Troponin i, poc 0.01  0.00 - 0.08 ng/mL   Comment 3            Dg Chest 2 View  09/26/2013   CLINICAL DATA:  43 year old female with central chest pain radiating to the right shoulder with shortness of Breath. Initial encounter.  EXAM: CHEST  2 VIEW  COMPARISON:  None.  FINDINGS: Cardiomegaly. On the lateral view left atrial enlargement is suggested. Other mediastinal contours are within normal limits. Visualized tracheal air column is within normal limits. Somewhat low lung volumes with crowding of markings. No pneumothorax, pleural effusion or consolidation. No definite pulmonary edema. Mild scoliosis. No acute osseous  abnormality identified.  IMPRESSION: Cardiomegaly/left atrial enlargement suspected. No acute pulmonary process.   Electronically Signed   By: Lars Pinks M.D.   On: 09/26/2013 16:53    CT ANGIOGRAPHY CHEST WITH CONTRAST  TECHNIQUE:  Multidetector CT imaging of the chest was performed using the  standard protocol during bolus administration of intravenous  contrast. Multiplanar CT image reconstructions and MIPs were  obtained to evaluate the vascular anatomy.  CONTRAST: 181mL OMNIPAQUE IOHEXOL 350 MG/ML SOLN  COMPARISON: Chest x-ray September 26, 2013  FINDINGS: There is no pulmonary embolus. There is no mediastinal or hilar lymphadenopathy. The heart size is mildly enlarged. There are diffuse patchy ground-glass opacity throughout both lungs. There is no pleural effusion. The visualized upper abdominal structures are normal. There is scoliosis of spine. No acute abnormalities identified within the bones. Review of the MIP images confirms the above findings.  IMPRESSION: No pulmonary embolus. Diffuse patchy ground-glass opacity throughout both lungs. This is nonspecific. Differential diagnosis includes but is not limited to viral infection, DIP, allergic hypersensitivity. Clinical correlation is recommended.  Electronically Signed  By: Abelardo Diesel M.D.  On: 09/26/2013 19:27      MDM   Final diagnoses:  Chest pain, unspecified chest pain type  Dyspnea  Abnormal CT scan, lung  Essential hypertension   Patient presents with chest discomfort and shortness of breath. Multiple risk factors for CAD. D-dimer to rule out PE. D simer, positive CT ordered. CT shows diffuse ground-glass opacity. No PE. Re-eval pt currently asymptomatic, complains of headache after nitro. Discussed patient history, condition with family practice who agrees to admit the patient for further evaluation. Reevaluation patient denies chest pain at this time. Discussed need for admission and coronary disease workup. Patient  agrees with plan for admission.  Meds given in ED:  Medications  nitroGLYCERIN (NITROSTAT) SL tablet 0.4 mg (0.4 mg Sublingual Given 09/26/13 1827)  aspirin chewable tablet 324 mg (324 mg Oral Given 09/26/13 1758)  sodium chloride 0.9 % bolus 1,000 mL (0 mLs Intravenous  Stopped 09/26/13 1954)  iohexol (OMNIPAQUE) 350 MG/ML injection 100 mL (100 mLs Intravenous Contrast Given 09/26/13 1846)    New Prescriptions   No medications on file        Lorrine Kin, PA-C 09/26/13 2056

## 2013-09-26 NOTE — Progress Notes (Signed)
   Pt in nurse clinic for blood pressure check.  Blood pressure 150/100 manually, heart rate 74.  Pt complained of chest pain (pressure), dizziness, blurred vision, SOB, cough, vomiting and headache (back of head).  Precepted with Dr. Adrian Blackwater; have pt go to emergency room for evaluation ASAP.  Pt stated that chest pain started over the weekend and went away.  Chest pain started again and feels like pressure.  Pt left [facility] and went to emergency room.  Derl Barrow, RN

## 2013-09-26 NOTE — H&P (Signed)
[facility] Admission History and Physical Service Pager: [phone]  Patient name: Julia Dunn Medical record number: [id number] Date of birth: [birth year] Age: 43 y.o. Gender: female  Primary Care Provider: Minerva Ends, MD Consultants: Cardiology Code Status: Full code  Chief Complaint: Shortness of breath  Assessment and Plan: Julia Dunn is a 43 y.o. female presenting with shortness of breath and chest pain. PMH is significant for DM2, hypertension, obesity   # Shortness of breath: Differential includes acute MI vs heart failure vs PE vs pneumonia. HEART score: 5. Initial troponin negative and EKG shows tachycardia, normal sinus rhythm. Chest x-ray shows cardiomegaly with left atrial enlargement and CT chest negative for PE but shows ground glass appearance of lungs. Overall she is euvolemic/hypovolemic but has crackles on lung exam. No previous echo. No elevated WBCs and no fever. - admit to inpatient, telemetry, attending Dr. Mingo Amber - consult cardiology, follow-up recommendations  - stress test in AM - f/u troponinx3 - EKG in AM - Risk stratification labs: TSH, lipid panel, hgb A1c - aspirin 81mg  - proBNP - daily weights - ins/outs - NPO overnight  # Chest pain: currently resolved. Unknown etiology - manage as in above problem  # Hypertension: patient takes lisinopril-hctz at home. Currently uncontrolled. - continue lisinopril 40mg  daily and hctz 25mg  daily - hydralazine 5mg  q4 hrs PRN for systolic BP >062  # Diabetes mellitus, type 2:  - SSI - CBG qAC, qHS  # Depression: stable - continue fluoxetine  FEN/GI: NPO, 1/2NS @100ml /hr Prophylaxis: Heparin subq, protonix  Disposition: admit to inpatient, telemetry  History of Present Illness: Julia Dunn is a 43 y.o. female presenting with shortness of breath and chest pain.  She has had worsening shortness of breath over a year due to anemia, reportedly worse over the last 2 months.  Going up 20 stairs and to chair is a huge job and she is obviously short of breath to the point of it being noticeable to her coworkers. She had also lost 25 lbs which helped with shortness of breath. Friday she was having sex and had to stop breathing and was vomiting that night. She had chest pain and felt like her chest was pounding.Went up into right chest and down right arm to elbow. Improved with resting in bed, though it did not resolve. She did not take any medication at that time. The following day, she did not eat anything. She was ok that day but again today had pain, sweat, "heat", coughing and feeling like she had phlegm but being unable to cough anything up. She called her PCP at that time and BP was elevated so patien twas told to come to ED. She denies fevers, chills, diarrhea, constipation. She has heard herself wheezing at home with a stethoscope. Over last 6 months, feels she has to elevate herself to sleep on 3-4 pillows. No leg swelling. Has had PND. No bleeding from GI or vaginal. Has had some reflux. No ppi at home.  In the ED, she received aspirin 324mg  and nitroglycerin 4mg  x 2 and 1L NS bolus. Pain improved but the palpitations were still present. Chest pain is not present now, except when she coughs. She is feeling dyspneic. She is nauseated. In the ED, her BP is 184/109. CBC and BMET are normal. Ddimer is positive mildly but CTA neg for PE. Shows diffuse ground glass opacities.   Upreg neg, UA neg, ddimer pos at 0.51, poc trop neg,   Review Of Systems: Per  HPI with the following additions: None Otherwise 12 point review of systems was performed and was unremarkable.  Patient Active Problem List   Diagnosis Date Noted  . Chest pain 09/26/2013  . Onychomycosis 05/31/2013  . BI-RADS category 3 mammogram result 03/07/2013  . Fibroids 09/05/2011  . Vitamin D deficiency 08/13/2011  . Menorrhagia 09/27/2010  . Morbid obesity 08/10/2007  . ALLERGIC RHINITIS, SEASONAL 07/14/2006   . DIABETES MELLITUS II, UNCOMPLICATED 61/22/4497  . DEPRESSIVE DISORDER, NOS 05/28/2006  . HYPERTENSION, BENIGN SYSTEMIC 05/28/2006   Past Medical History: Past Medical History  Diagnosis Date  . Hypertension   . Asthma   . Anemia   . History of blood transfusion   . Bacterial vaginosis 2008  . Headache(784.0)     migraines  . Hx of blood clots   . Pregnancy induced hypertension   . Depression   . Depression   . Postpartum depression   . Trichomonas   . Diabetes mellitus     no meds- diet controlled  . Gestational diabetes   . Kidney infection    Past Surgical History: Past Surgical History  Procedure Laterality Date  . Tubal ligation    . Dilation and curettage of uterus    . Laparoscopy N/A 11/03/2012    Procedure: LAPAROSCOPY DIAGNOSTIC;  Surgeon: Ena Dawley, MD;  Location: [facility];  Service: Gynecology;  Laterality: N/A;  . Abdominal hysterectomy N/A 11/03/2012    Procedure: HYSTERECTOMY ABDOMINAL;  Surgeon: Ena Dawley, MD;  Location: [facility];  Service: Gynecology;  Laterality: N/A;  . Bilateral salpingectomy Bilateral 11/03/2012    Procedure: BILATERAL SALPINGECTOMY;  Surgeon: Ena Dawley, MD;  Location: [facility];  Service: Gynecology;  Laterality: Bilateral;  . Burch procedure N/A 11/03/2012    Procedure: BURCH PROCEDURE;  Surgeon: Ena Dawley, MD;  Location: [facility];  Service: Gynecology;  Laterality: N/A;  . Cystoscopy N/A 11/03/2012    Procedure: CYSTOSCOPY;  Surgeon: Ena Dawley, MD;  Location: [facility];  Service: Gynecology;  Laterality: N/A;   Social History: History  Substance Use Topics  . Smoking status: Never Smoker   . Smokeless tobacco: Never Used  . Alcohol Use: Yes     Comment: SOCIAL  Works from home  Additional social history: None  Please also refer to relevant sections of EMR.  Family History: Family History  Problem Relation Age of Onset  . Hypertension Father   . Heart disease Mother   . Rheumatic fever Mother   .  Hypertension Mother   . Stroke Mother   . Hypertension Brother   . Stroke Brother   . Heart disease Paternal Aunt   . Sickle cell trait Daughter    Allergies and Medications: Allergies  Allergen Reactions  . Other Swelling    Patient says she is unsure of caused the swelling but one of the meds given to her on the 8/6 for her surgery had her extreamly swollen   . Penicillins Hives    REACTION: unspecified   No current facility-administered medications on file prior to encounter.   Current Outpatient Prescriptions on File Prior to Encounter  Medication Sig Dispense Refill  . FLUoxetine (PROZAC) 20 MG tablet Take 1 tablet (20 mg total) by mouth daily.  30 tablet  1  . GARCINIA CAMBOGIA-CHROMIUM PO Take 1 tablet by mouth daily.      Marland Kitchen glyBURIDE (DIABETA) 5 MG tablet Take 10 mg by mouth daily at 6 (six) AM.      . lisinopril-hydrochlorothiazide (ZESTORETIC) 20-12.5 MG per  tablet Take 2 tablets by mouth daily.  60 tablet  3  . metFORMIN (GLUCOPHAGE) 500 MG tablet Take 1 tablet (500 mg total) by mouth 2 (two) times daily with a meal.  60 tablet  3  . glucose blood (ONE TOUCH TEST STRIPS) test strip 1 each by Other route as directed. (Onetouch Ultra)        Objective: BP 195/105  Pulse 60  Temp(Src) 98.2 F (36.8 C) (Oral)  Resp 20  Ht 5\' 5"  (1.651 m)  Wt 251 lb (113.853 kg)  BMI 41.77 kg/m2  SpO2 98%  LMP 06/29/2012  Exam: General: Laying in bed, watching TV in no acute distress HEENT: PERRL, EOM, moist mucous membranes Cardiovascular: Regular rate and rhythm, no murmur Respiratory: scattered wheezing bilaterally, no increased work of breathing, no diminished breath sounds. Faint bibasilar crackles Abdomen: soft, non-tender, non-distended Extremities: No calf tenderness, no edema Skin: Warm, well perfused Neuro: Alert and oriented x3, no focal deficit  Labs and Imaging: CBC BMET   Recent Labs Lab 09/26/13 1600  WBC 7.2  HGB 15.1*  HCT 43.1  PLT 211    Recent  Labs Lab 09/26/13 1600  NA 139  K 3.9  CL 104  CO2 20  BUN 11  CREATININE 0.71  GLUCOSE 143*  CALCIUM 8.9     Dg Chest 2 View  09/26/2013   CLINICAL DATA:  43 year old female with central chest pain radiating to the right shoulder with shortness of Breath. Initial encounter.  EXAM: CHEST  2 VIEW  COMPARISON:  None.  FINDINGS: Cardiomegaly. On the lateral view left atrial enlargement is suggested. Other mediastinal contours are within normal limits. Visualized tracheal air column is within normal limits. Somewhat low lung volumes with crowding of markings. No pneumothorax, pleural effusion or consolidation. No definite pulmonary edema. Mild scoliosis. No acute osseous abnormality identified.  IMPRESSION: Cardiomegaly/left atrial enlargement suspected. No acute pulmonary process.   Electronically Signed   By: Lars Pinks M.D.   On: 09/26/2013 16:53   Ct Angio Chest Pe W/cm &/or Wo Cm  09/26/2013   CLINICAL DATA:  Chest pain  EXAM: CT ANGIOGRAPHY CHEST WITH CONTRAST  TECHNIQUE: Multidetector CT imaging of the chest was performed using the standard protocol during bolus administration of intravenous contrast. Multiplanar CT image reconstructions and MIPs were obtained to evaluate the vascular anatomy.  CONTRAST:  18mL OMNIPAQUE IOHEXOL 350 MG/ML SOLN  COMPARISON:  Chest x-ray September 26, 2013  FINDINGS: There is no pulmonary embolus. There is no mediastinal or hilar lymphadenopathy. The heart size is mildly enlarged. There are diffuse patchy ground-glass opacity throughout both lungs. There is no pleural effusion. The visualized upper abdominal structures are normal. There is scoliosis of spine. No acute abnormalities identified within the bones.  Review of the MIP images confirms the above findings.  IMPRESSION: No pulmonary embolus.  Diffuse patchy ground-glass opacity throughout both lungs. This is nonspecific. Differential diagnosis includes but is not limited to viral infection, DIP, allergic  hypersensitivity. Clinical correlation is recommended.   Electronically Signed   By: Abelardo Diesel M.D.   On: 09/26/2013 19:27    Cordelia Poche, MD 09/27/2013, 12:10 AM PGY-1, [place] [facility] pager: [phone], text pages welcome

## 2013-09-26 NOTE — Progress Notes (Signed)
MD notified of pt c/o of a h/a along with her bp being 184/109. Hoover Brunette

## 2013-09-27 ENCOUNTER — Ambulatory Visit: Payer: 59 | Admitting: Family Medicine

## 2013-09-27 ENCOUNTER — Encounter (HOSPITAL_COMMUNITY): Payer: 59

## 2013-09-27 ENCOUNTER — Other Ambulatory Visit: Payer: Self-pay

## 2013-09-27 ENCOUNTER — Observation Stay (HOSPITAL_COMMUNITY): Payer: 59

## 2013-09-27 DIAGNOSIS — R079 Chest pain, unspecified: Secondary | ICD-10-CM

## 2013-09-27 LAB — BASIC METABOLIC PANEL
BUN: 10 mg/dL (ref 6–23)
CALCIUM: 8.6 mg/dL (ref 8.4–10.5)
CO2: 22 mEq/L (ref 19–32)
CREATININE: 0.79 mg/dL (ref 0.50–1.10)
Chloride: 102 mEq/L (ref 96–112)
Glucose, Bld: 168 mg/dL — ABNORMAL HIGH (ref 70–99)
Potassium: 3.3 mEq/L — ABNORMAL LOW (ref 3.7–5.3)
SODIUM: 139 meq/L (ref 137–147)

## 2013-09-27 LAB — CBC
HCT: 41.4 % (ref 36.0–46.0)
Hemoglobin: 13.9 g/dL (ref 12.0–15.0)
MCH: 29.3 pg (ref 26.0–34.0)
MCHC: 33.6 g/dL (ref 30.0–36.0)
MCV: 87.3 fL (ref 78.0–100.0)
PLATELETS: 186 10*3/uL (ref 150–400)
RBC: 4.74 MIL/uL (ref 3.87–5.11)
RDW: 12.9 % (ref 11.5–15.5)
WBC: 6.7 10*3/uL (ref 4.0–10.5)

## 2013-09-27 LAB — TROPONIN I
Troponin I: <0.3 ng/mL (ref <0.30)
Troponin I: <0.3 ng/mL (ref <0.30)

## 2013-09-27 LAB — HEMOGLOBIN A1C
Hgb A1c MFr Bld: 7.2 % — ABNORMAL HIGH (ref <5.7)
Mean Plasma Glucose: 160 mg/dL — ABNORMAL HIGH (ref <117)

## 2013-09-27 LAB — LIPID PANEL
CHOL/HDL RATIO: 3.2 ratio
CHOLESTEROL: 126 mg/dL (ref 0–200)
HDL: 40 mg/dL (ref >39)
LDL Cholesterol: 63 mg/dL (ref 0–99)
TRIGLYCERIDES: 116 mg/dL (ref <150)
VLDL: 23 mg/dL (ref 0–40)

## 2013-09-27 LAB — GLUCOSE, CAPILLARY
GLUCOSE-CAPILLARY: 149 mg/dL — AB (ref 70–99)
GLUCOSE-CAPILLARY: 188 mg/dL — AB (ref 70–99)
Glucose-Capillary: 154 mg/dL — ABNORMAL HIGH (ref 70–99)

## 2013-09-27 LAB — TSH: TSH: 0.646 u[IU]/mL (ref 0.350–4.500)

## 2013-09-27 LAB — PRO B NATRIURETIC PEPTIDE: PRO B NATRI PEPTIDE: 275 pg/mL — AB (ref 0–125)

## 2013-09-27 MED ORDER — REGADENOSON 0.4 MG/5ML IV SOLN
INTRAVENOUS | Status: AC
  Administered 2013-09-27: 10:00:00
  Filled 2013-09-27: qty 5

## 2013-09-27 MED ORDER — POTASSIUM CHLORIDE CRYS ER 20 MEQ PO TBCR
40.0000 meq | EXTENDED_RELEASE_TABLET | Freq: Once | ORAL | Status: AC
  Administered 2013-09-27: 40 meq via ORAL
  Filled 2013-09-27: qty 2

## 2013-09-27 MED ORDER — REGADENOSON 0.4 MG/5ML IV SOLN: 0.4000 mg | Freq: Once | INTRAVENOUS | Status: AC

## 2013-09-27 MED ORDER — SODIUM CHLORIDE 0.45 % IV SOLN
INTRAVENOUS | Status: DC
  Administered 2013-09-27: 05:00:00 via INTRAVENOUS

## 2013-09-27 MED ORDER — TECHNETIUM TC 99M SESTAMIBI GENERIC - CARDIOLITE
10.0000 | Freq: Once | INTRAVENOUS | Status: AC | PRN
  Administered 2013-09-27: 10 via INTRAVENOUS

## 2013-09-27 MED ORDER — HYDRALAZINE HCL 20 MG/ML IJ SOLN: 5.0000 mg | INTRAMUSCULAR | Status: DC | PRN

## 2013-09-27 MED ORDER — PANTOPRAZOLE SODIUM 40 MG PO TBEC
40.0000 mg | DELAYED_RELEASE_TABLET | Freq: Every day | ORAL | Status: DC
  Administered 2013-09-27: 40 mg via ORAL
  Filled 2013-09-27: qty 1

## 2013-09-27 MED ORDER — REGADENOSON 0.4 MG/5ML IV SOLN
INTRAVENOUS | Status: AC
  Filled 2013-09-27: qty 5

## 2013-09-27 MED ORDER — TECHNETIUM TC 99M SESTAMIBI GENERIC - CARDIOLITE
30.0000 | Freq: Once | INTRAVENOUS | Status: AC | PRN
  Administered 2013-09-27: 30 via INTRAVENOUS

## 2013-09-27 MED ORDER — AMLODIPINE BESYLATE 10 MG PO TABS: 10.0000 mg | ORAL_TABLET | Freq: Every day | ORAL | Status: DC

## 2013-09-27 MED ORDER — REGADENOSON 0.4 MG/5ML IV SOLN
0.4000 mg | Freq: Once | INTRAVENOUS | Status: DC
  Filled 2013-09-27: qty 5

## 2013-09-27 MED ORDER — AMLODIPINE BESYLATE 10 MG PO TABS
10.0000 mg | ORAL_TABLET | Freq: Every day | ORAL | Status: DC
  Administered 2013-09-27: 10 mg via ORAL
  Filled 2013-09-27 (×2): qty 1

## 2013-09-27 NOTE — Progress Notes (Signed)
CALL PAGER [phone] for any questions or notifications regarding this patient   [facility]MT[facility] Attending Daily Note: Sara Neal MD  Attending pager:[phone]  office [phone]  I  have seen and examined this patient, reviewed their chart. I have discussed this patient with the resident. I agree with the resident's findings, assessment and care plan. 

## 2013-09-27 NOTE — Discharge Instructions (Signed)
Hypertension Hypertension, commonly called high blood pressure, is when the force of blood pumping through your arteries is too strong. Your arteries are the blood vessels that carry blood from your heart throughout your body. A blood pressure reading consists of a higher number over a lower number, such as 110/72. The higher number (systolic) is the pressure inside your arteries when your heart pumps. The lower number (diastolic) is the pressure inside your arteries when your heart relaxes. Ideally you want your blood pressure below 120/80. Hypertension forces your heart to work harder to pump blood. Your arteries may become narrow or stiff. Having hypertension puts you at risk for heart disease, stroke, and other problems.  RISK FACTORS Some risk factors for high blood pressure are controllable. Others are not.  Risk factors you cannot control include:   Race. You may be at higher risk if you are African American.  Age. Risk increases with age.  Gender. Men are at higher risk than women before age 14 years. After age 49, women are at higher risk than men. Risk factors you can control include:  Not getting enough exercise or physical activity.  Being overweight.  Getting too much fat, sugar, calories, or salt in your diet.  Drinking too much alcohol. SIGNS AND SYMPTOMS Hypertension does not usually cause signs or symptoms. Extremely high blood pressure (hypertensive crisis) may cause headache, anxiety, shortness of breath, and nosebleed. DIAGNOSIS  To check if you have hypertension, your health care provider will measure your blood pressure while you are seated, with your arm held at the level of your heart. It should be measured at least twice using the same arm. Certain conditions can cause a difference in blood pressure between your right and left arms. A blood pressure reading that is higher than normal on one occasion does not mean that you need treatment. If one blood pressure reading  is high, ask your health care provider about having it checked again. TREATMENT  Treating high blood pressure includes making lifestyle changes and possibly taking medication. Living a healthy lifestyle can help lower high blood pressure. You may need to change some of your habits. Lifestyle changes may include:  Following the DASH diet. This diet is high in fruits, vegetables, and whole grains. It is low in salt, red meat, and added sugars.  Getting at least 2 1/2 hours of brisk physical activity every week.  Losing weight if necessary.  Not smoking.  Limiting alcoholic beverages.  Learning ways to reduce stress. If lifestyle changes are not enough to get your blood pressure under control, your health care provider may prescribe medicine. You may need to take more than one. Work closely with your health care provider to understand the risks and benefits. HOME CARE INSTRUCTIONS  Have your blood pressure rechecked as directed by your health care provider.   Only take medicine as directed by your health care provider. Follow the directions carefully. Blood pressure medicines must be taken as prescribed. The medicine does not work as well when you skip doses. Skipping doses also puts you at risk for problems.   Do not smoke.   Monitor your blood pressure at home as directed by your health care provider. SEEK MEDICAL CARE IF:   You think you are having a reaction to medicines taken.  You have recurrent headaches or feel dizzy.  You have swelling in your ankles.  You have trouble with your vision. SEEK IMMEDIATE MEDICAL CARE IF:  You develop a severe headache or  confusion.  You have unusual weakness, numbness, or feel faint.  You have severe chest or abdominal pain.  You vomit repeatedly.  You have trouble breathing. MAKE SURE YOU:   Understand these instructions.  Will watch your condition.  Will get help right away if you are not doing well or get  worse. Document Released: 03/17/2005 Document Revised: 03/22/2013 Document Reviewed: 01/07/2013 [facility] Patient Information 2015 [place], [place]. This information is not intended to replace advice given to you by your health care provider. Make sure you discuss any questions you have with your health care provider.  Chest Pain (Nonspecific) It is often hard to give a specific diagnosis for the cause of chest pain. There is always a chance that your pain could be related to something serious, such as a heart attack or a blood clot in the lungs. You need to follow up with your health care provider for further evaluation. CAUSES   Heartburn.  Pneumonia or bronchitis.  Anxiety or stress.  Inflammation around your heart (pericarditis) or lung (pleuritis or pleurisy).  A blood clot in the lung.  A collapsed lung (pneumothorax). It can develop suddenly on its own (spontaneous pneumothorax) or from trauma to the chest.  Shingles infection (herpes zoster virus). The chest wall is composed of bones, muscles, and cartilage. Any of these can be the source of the pain.  The bones can be bruised by injury.  The muscles or cartilage can be strained by coughing or overwork.  The cartilage can be affected by inflammation and become sore (costochondritis). DIAGNOSIS  Lab tests or other studies may be needed to find the cause of your pain. Your health care provider may have you take a test called an ambulatory electrocardiogram (ECG). An ECG records your heartbeat patterns over a 24-hour period. You may also have other tests, such as:  Transthoracic echocardiogram (TTE). During echocardiography, sound waves are used to evaluate how blood flows through your heart.  Transesophageal echocardiogram (TEE).  Cardiac monitoring. This allows your health care provider to monitor your heart rate and rhythm in real time.  Holter monitor. This is a portable device that records your heartbeat and can help  diagnose heart arrhythmias. It allows your health care provider to track your heart activity for several days, if needed.  Stress tests by exercise or by giving medicine that makes the heart beat faster. TREATMENT   Treatment depends on what may be causing your chest pain. Treatment may include:  Acid blockers for heartburn.  Anti-inflammatory medicine.  Pain medicine for inflammatory conditions.  Antibiotics if an infection is present.  You may be advised to change lifestyle habits. This includes stopping smoking and avoiding alcohol, caffeine, and chocolate.  You may be advised to keep your head raised (elevated) when sleeping. This reduces the chance of acid going backward from your stomach into your esophagus. Most of the time, nonspecific chest pain will improve within 2-3 days with rest and mild pain medicine.  HOME CARE INSTRUCTIONS   If antibiotics were prescribed, take them as directed. Finish them even if you start to feel better.  For the next few days, avoid physical activities that bring on chest pain. Continue physical activities as directed.  Do not use any tobacco products, including cigarettes, chewing tobacco, or electronic cigarettes.  Avoid drinking alcohol.  Only take medicine as directed by your health care provider.  Follow your health care provider's suggestions for further testing if your chest pain does not go away.  Keep any  follow-up appointments you made. If you do not go to an appointment, you could develop lasting (chronic) problems with pain. If there is any problem keeping an appointment, call to reschedule. SEEK MEDICAL CARE IF:   Your chest pain does not go away, even after treatment.  You have a rash with blisters on your chest.  You have a fever. SEEK IMMEDIATE MEDICAL CARE IF:   You have increased chest pain or pain that spreads to your arm, neck, jaw, back, or abdomen.  You have shortness of breath.  You have an increasing cough,  or you cough up blood.  You have severe back or abdominal pain.  You feel nauseous or vomit.  You have severe weakness.  You faint.  You have chills. This is an emergency. Do not wait to see if the pain will go away. Get medical help at once. Call your local emergency services (911 in [place] Do not drive yourself to the hospital. MAKE SURE YOU:   Understand these instructions.  Will watch your condition.  Will get help right away if you are not doing well or get worse. Document Released: 12/25/2004 Document Revised: 03/22/2013 Document Reviewed: 10/21/2007 [facility] Patient Information 2015 [place], [place]. This information is not intended to replace advice given to you by your health care provider. Make sure you discuss any questions you have with your health care provider.  Diets for Diabetes, Food Labeling Look at food labels to help you decide how much of a product you can eat. You will want to check the amount of total carbohydrate in a serving to see how the food fits into your meal plan. In the list of ingredients, the ingredient present in the largest amount by weight must be listed first, followed by the other ingredients in descending order. STANDARD OF IDENTITY Most products have a list of ingredients. However, foods that the Food and Drug Administration (FDA) has given a standard of identity do not need a list of ingredients. A standard of identity means that a food must contain certain ingredients if it is called a particular name. Examples are mayonnaise, peanut butter, ketchup, jelly, and cheese. LABELING TERMS There are many terms found on food labels. Some of these terms have specific definitions. Some terms are regulated by the FDA, and the FDA has clearly specified how they can be used. Others are not regulated or well-defined and can be misleading and confusing. SPECIFICALLY DEFINED TERMS Nutritive Sweetener.  A sweetener that contains calories,such as table sugar or  honey. Nonnutritive Sweetener.  A sweetener with few or no calories,such as saccharin, aspartame, sucralose, and cyclamate. LABELING TERMS REGULATED BY THE FDA Free.  The product contains only a tiny or small amount of fat, cholesterol, sodium, sugar, or calories. For example, a "fat-free" product will contain less than 0.5 g of fat per serving. Low.  A food described as "low" in fat, saturated fat, cholesterol, sodium, or calories could be eaten fairly often without exceeding dietary guidelines. For example, "low in fat" means no more than 3 g of fat per serving. Lean.  "Lean" and "extra lean" are U.S. Department of Agriculture Scientist, research (physical sciences)) terms for use on meat and poultry products. "Lean" means the product contains less than 10 g of fat, 4 g of saturated fat, and 95 mg of cholesterol per serving. "Lean" is not as low in fat as a product labeled "low." Extra Lean.  "Extra lean" means the product contains less than 5 g of fat, 2 g of saturated  fat, and 95 mg of cholesterol per serving. While "extra lean" has less fat than "lean," it is still higher in fat than a product labeled "low." Reduced, Less, Fewer.  A diet product that contains 25% less of a nutrient or calories than the regular version. For example, hot dogs might be labeled "25% less fat than our regular hot dogs." Light/Lite.  A diet product that contains  fewer calories or  the fat of the original. For example, "light in sodium" means a product with  the usual sodium. More.  One serving contains at least 10% more of the daily value of a vitamin, mineral, or fiber than usual. Good Source Of.  One serving contains 10% to 19% of the daily value for a particular vitamin, mineral, or fiber. Excellent Source Of.  One serving contains 20% or more of the daily value for a particular nutrient. Other terms used might be "high in" or "rich in." Enriched or Fortified.  The product contains added vitamins, minerals, or protein.  Nutrition labeling must be used on enriched or fortified foods. Imitation.  The product has been altered so that it is lower in protein, vitamins, or minerals than the usual food,such as imitation peanut butter. Total Fat.  The number listed is the total of all fat found in a serving of the product. Under total fat, food labels must list saturated fat and trans fat, which are associated with raising bad cholesterol and an increased risk of heart blood vessel disease. Saturated Fat.  Mainly fats from animal-based sources. Some examples are red meat, cheese, cream, whole milk, and coconut oil. Trans Fat.  Found in some fried snack foods, packaged foods, and fried restaurant foods. It is recommended you eat as close to 0 g of trans fat as possible, since it raises bad cholesterol and lowers good cholesterol. Polyunsaturated and Monounsaturated Fats.  More healthful fats. These fats are from plant sources. Total Carbohydrate.  The number of carbohydrate grams in a serving of the product. Under total carbohydrate are listed the other carbohydrate sources, such as dietary fiber and sugars. Dietary Fiber.  A carbohydrate from plant sources. Sugars.  Sugars listed on the label contain all naturally occurring sugars as well as added sugars. LABELING TERMS NOT REGULATED BY THE FDA Sugarless.  Table sugar (sucrose) has not been added. However, the manufacturer may use another form of sugar in place of sucrose to sweeten the product. For example, sugar alcohols are used to sweeten foods. Sugar alcohols are a form of sugar but are not table sugar. If a product contains sugar alcohols in place of sucrose, it can still be labeled "sugarless." Low Salt, Salt-Free, Unsalted, No Salt, No Salt Added, Without Added Salt.  Food that is usually processed with salt has been made without salt. However, the food may contain sodium-containing additives, such as preservatives, leavening agents, or  flavorings. Natural.  This term has no legal meaning. Organic.  Foods that are certified as organic have been inspected and approved by the USDA to ensure they are produced without pesticides, fertilizers containing synthetic ingredients, bioengineering, or ionizing radiation. Document Released: 03/20/2003 Document Revised: 06/09/2011 Document Reviewed: 10/05/2008 [facility] Patient Information 2015 [place], [place]. This information is not intended to replace advice given to you by your health care provider. Make sure you discuss any questions you have with your health care provider.  Diabetes Mellitus and Food It is important for you to manage your blood sugar (glucose) level. Your blood glucose level can be  greatly affected by what you eat. Eating healthier foods in the appropriate amounts throughout the day at about the same time each day will help you control your blood glucose level. It can also help slow or prevent worsening of your diabetes mellitus. Healthy eating may even help you improve the level of your blood pressure and reach or maintain a healthy weight.  HOW CAN FOOD AFFECT ME? Carbohydrates Carbohydrates affect your blood glucose level more than any other type of food. Your dietitian will help you determine how many carbohydrates to eat at each meal and teach you how to count carbohydrates. Counting carbohydrates is important to keep your blood glucose at a healthy level, especially if you are using insulin or taking certain medicines for diabetes mellitus. Alcohol Alcohol can cause sudden decreases in blood glucose (hypoglycemia), especially if you use insulin or take certain medicines for diabetes mellitus. Hypoglycemia can be a life-threatening condition. Symptoms of hypoglycemia (sleepiness, dizziness, and disorientation) are similar to symptoms of having too much alcohol.  If your health care provider has given you approval to drink alcohol, do so in moderation and use the  following guidelines:  Women should not have more than one drink per day, and men should not have more than two drinks per day. One drink is equal to:  12 oz of beer.  5 oz of wine.  1 oz of hard liquor.  Do not drink on an empty stomach.  Keep yourself hydrated. Have water, diet soda, or unsweetened iced tea.  Regular soda, juice, and other mixers might contain a lot of carbohydrates and should be counted. WHAT FOODS ARE NOT RECOMMENDED? As you make food choices, it is important to remember that all foods are not the same. Some foods have fewer nutrients per serving than other foods, even though they might have the same number of calories or carbohydrates. It is difficult to get your body what it needs when you eat foods with fewer nutrients. Examples of foods that you should avoid that are high in calories and carbohydrates but low in nutrients include:  Trans fats (most processed foods list trans fats on the Nutrition Facts label).  Regular soda.  Juice.  Candy.  Sweets, such as cake, pie, doughnuts, and cookies.  Fried foods. WHAT FOODS CAN I EAT? Have nutrient-rich foods, which will nourish your body and keep you healthy. The food you should eat also will depend on several factors, including:  The calories you need.  The medicines you take.  Your weight.  Your blood glucose level.  Your blood pressure level.  Your cholesterol level. You also should eat a variety of foods, including:  Protein, such as meat, poultry, fish, tofu, nuts, and seeds (lean animal proteins are best).  Fruits.  Vegetables.  Dairy products, such as milk, cheese, and yogurt (low fat is best).  Breads, grains, pasta, cereal, rice, and beans.  Fats such as olive oil, trans fat-free margarine, canola oil, avocado, and olives. DOES EVERYONE WITH DIABETES MELLITUS HAVE THE SAME MEAL PLAN? Because every person with diabetes mellitus is different, there is not one meal plan that works for  everyone. It is very important that you meet with a dietitian who will help you create a meal plan that is just right for you. Document Released: 12/12/2004 Document Revised: 03/22/2013 Document Reviewed: 02/11/2013 [facility] Patient Information 2015 [place], [place]. This information is not intended to replace advice given to you by your health care provider. Make sure you discuss any questions  you have with your health care provider.

## 2013-09-27 NOTE — Progress Notes (Signed)
Patient Name: Julia Dunn Date of Encounter: 09/27/2013     Active Problems:   Chest pain    SUBJECTIVE  Seen in nuclear lab for stress test. Appears anxious. Cried during [place]. Did not receive her lisinopril HCTZ before stress test. SBP 180s. Was originally an exercise nuclear stress test. We converted it to a lexiscan in the setting of hypertension.  CURRENT MEDS . aspirin EC  81 mg Oral Daily  . FLUoxetine  20 mg Oral Daily  . heparin  5,000 Units Subcutaneous 3 times per day  . lisinopril  40 mg Oral Daily   And  . hydrochlorothiazide  25 mg Oral Daily  . insulin aspart  0-15 Units Subcutaneous TID WC  . insulin aspart  0-5 Units Subcutaneous QHS  . pantoprazole  40 mg Oral Daily    OBJECTIVE  Filed Vitals:   09/27/13 1015 09/27/13 1016 09/27/13 1017 09/27/13 1020  BP: 190/95 199/94 198/120 192/117  Pulse: 80 109 111 94  Temp:      TempSrc:      Resp:      Height:      Weight:      SpO2:        Intake/Output Summary (Last 24 hours) at 09/27/13 1051 Last data filed at 09/27/13 0447  Gross per 24 hour  Intake    360 ml  Output    975 ml  Net   -615 ml   Filed Weights   09/26/13 1551 09/27/13 0436  Weight: 251 lb (113.853 kg) 251 lb 14.4 oz (114.261 kg)    PHYSICAL EXAM  General: Pleasant, NAD. Obese. anxious Neuro: Alert and oriented X 3. Moves all extremities spontaneously. Psych: Normal affect. HEENT:  Normal  Neck: Supple without bruits or JVD. Lungs:  Resp regular and unlabored, CTA. Heart: RRR no s3, s4, or murmurs. Abdomen: Soft, non-tender, non-distended, BS + x 4.  Extremities: No clubbing, cyanosis or edema. DP/PT/Radials 2+ and equal bilaterally.  Accessory Clinical Findings  CBC  Recent Labs  09/26/13 1600 09/27/13 0430  WBC 7.2 6.7  HGB 15.1* 13.9  HCT 43.1 41.4  MCV 85.5 87.3  PLT 211 616   Basic Metabolic Panel  Recent Labs  09/26/13 1600 09/27/13 0430  NA 139 139  K 3.9 3.3*  CL 104 102  CO2 20 22  GLUCOSE  143* 168*  BUN 11 10  CREATININE 0.71 0.79  CALCIUM 8.9 8.6    Cardiac Enzymes  Recent Labs  09/26/13 2233 09/27/13 0430  TROPONINI <0.30 <0.30   D-Dimer  Recent Labs  09/26/13 1741  DDIMER 0.51*    Fasting Lipid Panel  Recent Labs  09/27/13 0456  CHOL 126  HDL 40  LDLCALC 63  TRIG 116  CHOLHDL 3.2   Thyroid Function Tests  Recent Labs  09/27/13 0430  TSH 0.646    TELE  NSR  Radiology/Studies  Dg Chest 2 View  09/26/2013   CLINICAL DATA:  43 year old female with central chest pain radiating to the right shoulder with shortness of Breath. Initial encounter.  EXAM: CHEST  2 VIEW  COMPARISON:  None.  FINDINGS: Cardiomegaly. On the lateral view left atrial enlargement is suggested. Other mediastinal contours are within normal limits. Visualized tracheal air column is within normal limits. Somewhat low lung volumes with crowding of markings. No pneumothorax, pleural effusion or consolidation. No definite pulmonary edema. Mild scoliosis. No acute osseous abnormality identified.  IMPRESSION: Cardiomegaly/left atrial enlargement suspected. No acute pulmonary process.   Electronically  Signed   By: Lars Pinks M.D.   On: 09/26/2013 16:53   Ct Angio Chest Pe W/cm &/or Wo Cm  09/26/2013   CLINICAL DATA:  Chest pain  EXAM: CT ANGIOGRAPHY CHEST WITH CONTRAST  TECHNIQUE: Multidetector CT imaging of the chest was performed using the standard protocol during bolus administration of intravenous contrast. Multiplanar CT image reconstructions and MIPs were obtained to evaluate the vascular anatomy.  CONTRAST:  161mL OMNIPAQUE IOHEXOL 350 MG/ML SOLN  COMPARISON:  Chest x-ray September 26, 2013  FINDINGS: There is no pulmonary embolus. There is no mediastinal or hilar lymphadenopathy. The heart size is mildly enlarged. There are diffuse patchy ground-glass opacity throughout both lungs. There is no pleural effusion. The visualized upper abdominal structures are normal. There is scoliosis of  spine. No acute abnormalities identified within the bones.  Review of the MIP images confirms the above findings.  IMPRESSION: No pulmonary embolus.  Diffuse patchy ground-glass opacity throughout both lungs. This is nonspecific. Differential diagnosis includes but is not limited to viral infection, DIP, allergic hypersensitivity. Clinical correlation is recommended.    ASSESSMENT AND PLAN 43 yr old female with hx of asthma, HTN and DM who presented yesterday with chest pain and exertional SOB.   Chest pain- suspect pt has component of anxiety and SOB maybe in significant part from lung disease ( hx of asthma and abnormal CT chest ) . -- Ruled out overnight with serial cardiac enzymes and stable ECG. -- Underwent lexiscan myoview today. Pending results. -- Continue ASA  Lung disease- elevated D-Dimer. CT with "No pulmonary embolus.  Diffuse patchy ground-glass opacity throughout both lungs. This is nonspecific. Differential diagnosis includes but is not limited to viral infection, DIP, allergic hypersensitivity. Clinical correlation is recommended."  HTN- did not receive her lisinopril HCTZ before stress test. SBP 180s. Was originally an exercise nuclear stress test. We converted it to a lexiscan in the setting of hypertension. Resume home meds and continue to monitor.   Hypokalemia- mildly low. Supp  DM- hga1c in process   Tyrell Antonio PA-C  Pager [phone] Patient seen and examined. I agree with the assessment and plan as detailed above. See also my additional thoughts below.   Our team will help follow through with the results of the nuclear stress test today.  Dola Argyle, MD, [facility] 09/27/2013 11:06 AM

## 2013-09-27 NOTE — Progress Notes (Signed)
UR Completed.  Julia Dunn, Julia Dunn [phone] 09/27/2013  

## 2013-09-27 NOTE — H&P (Signed)
FMTS Attending Admission Note: Annabell Sabal MD Personal pager:  [phone] FPTS Service Pager:  [phone]  I  have seen and examined this patient, reviewed their chart. I have discussed this patient with the resident. I agree with the resident's findings, assessment and care plan.  Additionally: - exertional chest pain, occurred Friday PM and Monday AM.  IN past several weeks, only able to walk to driveway before having chest pressure and shortness of breath.   - Agree with cardiology consult, appreciate rec's  - EKG NRS, troponins negative thus far - Likely nuclear study today, don't think she'd be able to tolerate exercise stress - CPAP/pulm HTN could also be playing role as she has symptoms when trying to sleep at night as well.  - Recommend echo for pulm pressures.    Alveda Reasons, MD 09/27/2013 7:45 AM

## 2013-09-27 NOTE — Progress Notes (Signed)
Reviewed with Dr Harrington Challenger. Stress Myoview normal, no reason to keep her for rest tomorrow. We will see as needed.  Kerin Ransom PA-C 09/27/2013 3:52 PM

## 2013-09-27 NOTE — Progress Notes (Signed)
Family Medicine Teaching Service Daily Progress Note Intern Pager: [phone]  Patient name: Julia Dunn Medical record number: [id number] Date of birth: [birth year] Age: 43 y.o. Gender: female  Primary Care Saralee Bolick: Minerva Ends, MD Consultants: Cardiology Code Status: Full  Pt Overview and Major Events to Date:  6/29: Admitted for ACS rule out 6/30: Lexiscan myoview  Assessment and Plan: Julia Dunn is a 43 y.o. female presenting with shortness of breath and chest pain. PMH is significant for DM2, hypertension, obesity   # Chest pain and dyspnea: Differential includes acute MI vs heart failure vs PE vs pneumonia. HEART score: 5. Chest x-ray shows cardiomegaly with left atrial enlargement and CT chest negative for PE but shows ground glass appearance of lungs. - Cardiology consult: stress test today - Troponin neg x2 - EKG in AM shows bradycardia with stable abnormal repolarization in precordial leads - Risk stratification labs: TSH 0.646, lipid panel wnl, hgb A1c  - aspirin 81mg   - proBNP: 275  # Hypertension: patient takes lisinopril-hctz at home. Currently uncontrolled; BP as high as 208/130 overnight.   - continue lisinopril 40mg  daily and hctz 25mg  daily  - Add norvasc - hydralazine 5mg  q4 hrs PRN for systolic BP >062   # Diabetes mellitus, type 2:  - SSI  - CBG qAC, qHS   # Depression: stable  - continue fluoxetine   FEN/GI: NPO, 1/2NS @100ml /hr  Prophylaxis: Heparin subq, protonix  Disposition: Pending stress test today  Subjective:  Pt anxious appearing after stress test. No CP or SOB  Objective: Temp:  [98.2 F (36.8 C)-98.4 F (36.9 C)] 98.2 F (36.8 C) (06/30 0436) Pulse Rate:  [60-89] 67 (06/30 0436) Resp:  [18-22] 18 (06/30 0436) BP: (149-208)/(88-130) 149/88 mmHg (06/30 0436) SpO2:  [92 %-98 %] 98 % (06/30 0436) Weight:  [251 lb (113.853 kg)-251 lb 14.4 oz (114.261 kg)] 251 lb 14.4 oz (114.261 kg) (06/30 0436) Physical Exam: General: Alert 43  y.o. female in NAD Cardiovascular: Regular rate and rhythm, no murmur  Respiratory: Non-labored, CTAB  Abdomen: soft, non-tender, non-distended  Extremities: No calf tenderness, no edema  Skin: Warm, well perfused  Neuro: Alert and oriented x3, no focal deficit  Laboratory:  Recent Labs Lab 09/26/13 1600 09/27/13 0430  WBC 7.2 6.7  HGB 15.1* 13.9  HCT 43.1 41.4  PLT 211 186    Recent Labs Lab 09/26/13 1600 09/27/13 0430  NA 139 139  K 3.9 3.3*  CL 104 102  CO2 20 22  BUN 11 10  CREATININE 0.71 0.79  CALCIUM 8.9 8.6  GLUCOSE 143* 168*   CHEST 2 VIEW COMPARISON: None. FINDINGS: Cardiomegaly. On the lateral view left atrial enlargement is suggested. Other mediastinal contours are within normal limits. Visualized tracheal air column is within normal limits. Somewhat low lung volumes with crowding of markings. No pneumothorax, pleural effusion or consolidation. No definite pulmonary edema. Mild scoliosis. No acute osseous abnormality identified. IMPRESSION: Cardiomegaly/left atrial enlargement suspected. No acute pulmonary process. Electronically Signed By: Lars Pinks M.D. On: 09/26/2013 16:53   CT ANGIOGRAPHY CHEST WITH CONTRAST TECHNIQUE: Multidetector CT imaging of the chest was performed using the standard protocol during bolus administration of intravenous contrast. Multiplanar CT image reconstructions and MIPs were obtained to evaluate the vascular anatomy. CONTRAST: 181mL OMNIPAQUE IOHEXOL 350 MG/ML SOLN COMPARISON: Chest x-ray September 26, 2013 FINDINGS: There is no pulmonary embolus. There is no mediastinal or hilar lymphadenopathy. The heart size is mildly enlarged. There are diffuse patchy ground-glass opacity throughout both lungs. There  is no pleural effusion. The visualized upper abdominal structures are normal. There is scoliosis of spine. No acute abnormalities identified within the bones. Review of the MIP images confirms the above findings. IMPRESSION: No pulmonary embolus.  Diffuse patchy ground-glass opacity throughout both lungs. This is nonspecific. Differential diagnosis includes but is not limited to viral infection, DIP, allergic hypersensitivity. Clinical correlation is recommended. Electronically Signed By: Abelardo Diesel M.D. On: 09/26/2013 19:27    Vance Gather, MD 09/27/2013, 8:08 AM PGY-1, [place] Intern pager: [phone], text pages welcome

## 2013-09-27 NOTE — Discharge Summary (Signed)
[facility] Discharge Summary  Patient name: Julia Dunn Medical record number: [id number] Date of birth: Dec 27, 1970 Age: 43 y.o. Gender: female Date of Admission: 09/26/2013  Date of Discharge: 09/27/2013 Admitting Physician: Julia Reasons, MD  Primary Care Provider: Minerva Ends, MD Consultants: Cardiology  Indication for Hospitalization:  SOB and chest pain  Discharge Diagnoses/Problem List:  Patient Active Problem List   Diagnosis Date Noted  . Chest pain 09/26/2013  . Onychomycosis 05/31/2013  . BI-RADS category 3 mammogram result 03/07/2013  . Fibroids 09/05/2011  . Vitamin D deficiency 08/13/2011  . Menorrhagia 09/27/2010  . Morbid obesity 08/10/2007  . ALLERGIC RHINITIS, SEASONAL 07/14/2006  . DIABETES MELLITUS II, UNCOMPLICATED 82/99/3716  . DEPRESSIVE DISORDER, NOS 05/28/2006  . HYPERTENSION, BENIGN SYSTEMIC 05/28/2006   Disposition: Discharged home  Discharge Condition: Stable, improved  Discharge Exam:  General: Alert 43 y.o. female in NAD  Cardiovascular: Regular rate and rhythm, no murmur  Respiratory: Non-labored, CTAB  Abdomen: soft, non-tender, non-distended  Extremities: No calf tenderness, no edema  Skin: Warm, well perfused  Neuro: Alert and oriented x3, no focal deficit  Brief Hospital Course:  Julia Dunn is a 43 y.o. female presenting on 6/29 with shortness of breath and chest pain. PMH is significant for DM2, hypertension, obesity.   HEART score calculated at admission was 5. Chest x-ray shows cardiomegaly with left atrial enlargement and CT chest was negative for PE, but showed ground glass appearance of lungs. Cardiac enzymes remained negative and repeat ECG showed bradycardia without ischemic changes. TSH and lipid panel were normal. BNP was 275 and she appeared euvolemic.   Lexiscan myoview stress test was performed on 6/30 and was negative.  Lisinopril 40mg  daily and hctz 25mg  daily were continued and  blood pressure continued to be grossly elevated (>200/130). Norvasc was added with improved control.   Issues for Follow Up:  - Consider further risk factor modification - Follow up blood pressure  Significant Procedures: Lexiscan myoview  Significant Labs and Imaging:   Recent Labs Lab 09/26/13 1600 09/27/13 0430  WBC 7.2 6.7  HGB 15.1* 13.9  HCT 43.1 41.4  PLT 211 186    Recent Labs Lab 09/26/13 1600 09/27/13 0430  NA 139 139  K 3.9 3.3*  CL 104 102  CO2 20 22  GLUCOSE 143* 168*  BUN 11 10  CREATININE 0.71 0.79  CALCIUM 8.9 8.6   CHEST 2 VIEW  Cardiomegaly. On the lateral view left atrial enlargement is suggested. Other mediastinal contours are within normal limits. Visualized tracheal air column is within normal limits. Somewhat low lung volumes with crowding of markings. No pneumothorax, pleural effusion or consolidation. No definite pulmonary edema. Mild scoliosis. No acute osseous abnormality identified.  IMPRESSION: Cardiomegaly/left atrial enlargement suspected. No acute pulmonary process.  CT ANGIOGRAPHY CHEST WITH CONTRAST  There is no pulmonary embolus. There is no mediastinal or hilar lymphadenopathy. The heart size is mildly enlarged. There are diffuse patchy ground-glass opacity throughout both lungs. There is no pleural effusion. The visualized upper abdominal structures are normal. There is scoliosis of spine. No acute abnormalities identified within the bones. Review of the MIP images confirms the above findings.  IMPRESSION: No pulmonary embolus. Diffuse patchy ground-glass opacity throughout both lungs. This is nonspecific. Differential diagnosis includes but is not limited to viral infection, DIP, allergic hypersensitivity.   Results/Tests Pending at Time of Discharge: None  Discharge Medications:    Medication List    STOP taking these medications  lisinopril 10 MG tablet  Commonly known as:  PRINIVIL,ZESTRIL      TAKE these medications        FLUoxetine 20 MG tablet  Commonly known as:  PROZAC  Take 1 tablet (20 mg total) by mouth daily.     GARCINIA CAMBOGIA-CHROMIUM PO  Take 1 tablet by mouth daily.     glyBURIDE 5 MG tablet  Commonly known as:  DIABETA  Take 10 mg by mouth daily at 6 (six) AM.     lisinopril-hydrochlorothiazide 20-12.5 MG per tablet  Commonly known as:  ZESTORETIC  Take 2 tablets by mouth daily.     metFORMIN 500 MG tablet  Commonly known as:  GLUCOPHAGE  Take 1 tablet (500 mg total) by mouth 2 (two) times daily with a meal.     ONE TOUCH TEST STRIPS test strip  Generic drug:  glucose blood  1 each by Other route as directed. (Onetouch Ultra)        Discharge Instructions: Please refer to Patient Instructions section of EMR for full details.  Patient was counseled important signs and symptoms that should prompt return to medical care, changes in medications, dietary instructions, activity restrictions, and follow up appointments.   Follow-Up Appointments:     Follow-up Information   Follow up with Julia Ends, MD.   Specialty:  Family Medicine   Contact information:   [place] [address] [phone]       Julia Gather, MD 09/27/2013, 11:37 AM PGY-1, [place]

## 2013-09-29 NOTE — Discharge Summary (Signed)
CALL PAGER [phone] for any questions or notifications regarding this patient   FMTS Attending Daily Note: Sara Neal MD  Attending pager:[phone]  office [phone]  I  have seen and examined this patient, reviewed their chart. I have discussed this patient with the resident. I agree with the resident's findings, assessment and care plan. 

## 2013-10-10 ENCOUNTER — Ambulatory Visit: Payer: 59 | Admitting: Family Medicine

## 2014-01-01 ENCOUNTER — Emergency Department (HOSPITAL_COMMUNITY)
Admission: EM | Admit: 2014-01-01 | Discharge: 2014-01-01 | Disposition: A | Discharge status: Home / Self Care | Payer: 59 | Attending: Emergency Medicine | Admitting: Emergency Medicine

## 2014-01-01 ENCOUNTER — Encounter (HOSPITAL_COMMUNITY): Payer: Self-pay | Admitting: Emergency Medicine

## 2014-01-01 ENCOUNTER — Emergency Department (HOSPITAL_COMMUNITY): Payer: 59

## 2014-01-01 DIAGNOSIS — Z87448 Personal history of other diseases of urinary system: Secondary | ICD-10-CM | POA: Diagnosis not present

## 2014-01-01 DIAGNOSIS — R51 Headache: Secondary | ICD-10-CM | POA: Insufficient documentation

## 2014-01-01 DIAGNOSIS — Z8742 Personal history of other diseases of the female genital tract: Secondary | ICD-10-CM | POA: Insufficient documentation

## 2014-01-01 DIAGNOSIS — Z792 Long term (current) use of antibiotics: Secondary | ICD-10-CM | POA: Insufficient documentation

## 2014-01-01 DIAGNOSIS — Z8632 Personal history of gestational diabetes: Secondary | ICD-10-CM | POA: Diagnosis not present

## 2014-01-01 DIAGNOSIS — Z79899 Other long term (current) drug therapy: Secondary | ICD-10-CM | POA: Insufficient documentation

## 2014-01-01 DIAGNOSIS — I10 Essential (primary) hypertension: Secondary | ICD-10-CM | POA: Diagnosis not present

## 2014-01-01 DIAGNOSIS — Z880 Allergy status to penicillin: Secondary | ICD-10-CM | POA: Insufficient documentation

## 2014-01-01 DIAGNOSIS — E119 Type 2 diabetes mellitus without complications: Secondary | ICD-10-CM | POA: Diagnosis not present

## 2014-01-01 DIAGNOSIS — Z862 Personal history of diseases of the blood and blood-forming organs and certain disorders involving the immune mechanism: Secondary | ICD-10-CM | POA: Insufficient documentation

## 2014-01-01 DIAGNOSIS — J45901 Unspecified asthma with (acute) exacerbation: Secondary | ICD-10-CM | POA: Insufficient documentation

## 2014-01-01 DIAGNOSIS — N12 Tubulo-interstitial nephritis, not specified as acute or chronic: Secondary | ICD-10-CM | POA: Insufficient documentation

## 2014-01-01 DIAGNOSIS — Z8659 Personal history of other mental and behavioral disorders: Secondary | ICD-10-CM | POA: Insufficient documentation

## 2014-01-01 DIAGNOSIS — R079 Chest pain, unspecified: Secondary | ICD-10-CM | POA: Diagnosis present

## 2014-01-01 DIAGNOSIS — R0602 Shortness of breath: Secondary | ICD-10-CM

## 2014-01-01 LAB — CBC WITH DIFFERENTIAL/PLATELET
BASOS ABS: 0 10*3/uL (ref 0.0–0.1)
Basophils Relative: 0 % (ref 0–1)
EOS ABS: 0 10*3/uL (ref 0.0–0.7)
EOS PCT: 0 % (ref 0–5)
HCT: 40.4 % (ref 36.0–46.0)
Hemoglobin: 14.3 g/dL (ref 12.0–15.0)
LYMPHS ABS: 1.1 10*3/uL (ref 0.7–4.0)
Lymphocytes Relative: 11 % — ABNORMAL LOW (ref 12–46)
MCH: 29.9 pg (ref 26.0–34.0)
MCHC: 35.4 g/dL (ref 30.0–36.0)
MCV: 84.5 fL (ref 78.0–100.0)
Monocytes Absolute: 1.4 10*3/uL — ABNORMAL HIGH (ref 0.1–1.0)
Monocytes Relative: 14 % — ABNORMAL HIGH (ref 3–12)
NEUTROS PCT: 75 % (ref 43–77)
Neutro Abs: 7.7 10*3/uL (ref 1.7–7.7)
PLATELETS: 141 10*3/uL — AB (ref 150–400)
RBC: 4.78 MIL/uL (ref 3.87–5.11)
RDW: 13 % (ref 11.5–15.5)
WBC: 10.1 10*3/uL (ref 4.0–10.5)

## 2014-01-01 LAB — URINALYSIS, ROUTINE W REFLEX MICROSCOPIC
Glucose, UA: NEGATIVE mg/dL
Ketones, ur: 40 mg/dL — AB
Nitrite: POSITIVE — AB
Protein, ur: 100 mg/dL — AB
SPECIFIC GRAVITY, URINE: 1.027 (ref 1.005–1.030)
UROBILINOGEN UA: 1 mg/dL (ref 0.0–1.0)
pH: 5.5 (ref 5.0–8.0)

## 2014-01-01 LAB — COMPREHENSIVE METABOLIC PANEL
ALK PHOS: 60 U/L (ref 39–117)
ALT: 14 U/L (ref 0–35)
AST: 22 U/L (ref 0–37)
Albumin: 3.5 g/dL (ref 3.5–5.2)
Anion gap: 18 — ABNORMAL HIGH (ref 5–15)
BUN: 16 mg/dL (ref 6–23)
CO2: 20 mEq/L (ref 19–32)
Calcium: 8.6 mg/dL (ref 8.4–10.5)
Chloride: 92 mEq/L — ABNORMAL LOW (ref 96–112)
Creatinine, Ser: 1.03 mg/dL (ref 0.50–1.10)
GFR calc non Af Amer: 66 mL/min — ABNORMAL LOW (ref >90)
GFR, EST AFRICAN AMERICAN: 77 mL/min — AB (ref >90)
GLUCOSE: 231 mg/dL — AB (ref 70–99)
POTASSIUM: 3.4 meq/L — AB (ref 3.7–5.3)
SODIUM: 130 meq/L — AB (ref 137–147)
TOTAL PROTEIN: 7.7 g/dL (ref 6.0–8.3)
Total Bilirubin: 1.1 mg/dL (ref 0.3–1.2)

## 2014-01-01 LAB — I-STAT TROPONIN, ED: Troponin i, poc: 0.02 ng/mL (ref 0.00–0.08)

## 2014-01-01 LAB — URINE MICROSCOPIC-ADD ON

## 2014-01-01 LAB — I-STAT CG4 LACTIC ACID, ED: Lactic Acid, Venous: 1.45 mmol/L (ref 0.5–2.2)

## 2014-01-01 MED ORDER — ONDANSETRON HCL 4 MG/2ML IJ SOLN
4.0000 mg | Freq: Once | INTRAMUSCULAR | Status: AC
  Administered 2014-01-01: 4 mg via INTRAVENOUS
  Filled 2014-01-01: qty 2

## 2014-01-01 MED ORDER — IPRATROPIUM-ALBUTEROL 0.5-2.5 (3) MG/3ML IN SOLN
3.0000 mL | RESPIRATORY_TRACT | Status: DC
  Administered 2014-01-01: 3 mL via RESPIRATORY_TRACT
  Filled 2014-01-01: qty 3

## 2014-01-01 MED ORDER — MORPHINE SULFATE 4 MG/ML IJ SOLN
4.0000 mg | Freq: Once | INTRAMUSCULAR | Status: AC
  Administered 2014-01-01: 4 mg via INTRAVENOUS
  Filled 2014-01-01: qty 1

## 2014-01-01 MED ORDER — ONDANSETRON 4 MG PO TBDP: ORAL_TABLET | ORAL | Status: DC

## 2014-01-01 MED ORDER — CEFUROXIME AXETIL 250 MG PO TABS: 250.0000 mg | ORAL_TABLET | Freq: Two times a day (BID) | ORAL | Status: DC

## 2014-01-01 MED ORDER — HYDROCODONE-ACETAMINOPHEN 5-325 MG PO TABS: 2.0000 | ORAL_TABLET | ORAL | Status: DC | PRN

## 2014-01-01 MED ORDER — ACETAMINOPHEN 325 MG PO TABS
650.0000 mg | ORAL_TABLET | Freq: Four times a day (QID) | ORAL | Status: DC | PRN
  Administered 2014-01-01: 650 mg via ORAL
  Filled 2014-01-01: qty 2

## 2014-01-01 MED ORDER — DEXTROSE 5 % IV SOLN
2.0000 g | Freq: Once | INTRAVENOUS | Status: AC
  Administered 2014-01-01: 2 g via INTRAVENOUS
  Filled 2014-01-01: qty 2

## 2014-01-01 MED ORDER — SODIUM CHLORIDE 0.9 % IV BOLUS (SEPSIS)
1000.0000 mL | Freq: Once | INTRAVENOUS | Status: AC
  Administered 2014-01-01: 1000 mL via INTRAVENOUS

## 2014-01-01 NOTE — ED Provider Notes (Signed)
CSN: [id number]     Arrival date & time 01/01/14  1704 History   First MD Initiated Contact with Patient 01/01/14 1718     Chief Complaint  Patient presents with  . Migraine  . Chest Pain  . Vomiting     (Consider location/radiation/quality/duration/timing/severity/associated sxs/prior Treatment) HPI Julia Dunn is a 43 y.o. female history of diabetes, hypertension comes in for evaluation of nausea, fever, chest pain and headache. Patient states she has had a migraine since Wednesday. She has not been to a neurologist for a formal diagnosis. She has not tried anything for the migraine. She reports this feels like all her previous headaches and it is a throbbing on the side of her head. She also reports Friday morning around 2:00 a.m. she became nauseous and had one episode of vomiting without blood. She also reports fever to 103 orally, but has not taken anything for the fever. She also reports associated chest pain with shortness of breath and feels like there is "a heaviness" on her chest. She reports she's been short of breath for the past 6 months and this is not different from her baseline. She is not on home oxygen. The chest pain has been constant since Thursday, was nonexertional and radiates from under both breasts towards the middle of her chest Patient also reports diffuse abdominal discomfort and admits she has not taken her diabetes medications today. No sick contacts. Denies cough, congestion. Patient was recently discharged on June 30 for shortness of breath and chest pain, had a normal stress test and unchanged EKG, no history of MI.  Past Medical History  Diagnosis Date  . Hypertension   . Asthma   . Anemia   . History of blood transfusion   . Bacterial vaginosis 2008  . Headache(784.0)     migraines  . Hx of blood clots   . Pregnancy induced hypertension   . Depression   . Depression   . Postpartum depression   . Trichomonas   . Diabetes mellitus     no meds- diet  controlled  . Gestational diabetes   . Kidney infection    Past Surgical History  Procedure Laterality Date  . Tubal ligation    . Dilation and curettage of uterus    . Laparoscopy N/A 11/03/2012    Procedure: LAPAROSCOPY DIAGNOSTIC;  Surgeon: Ena Dawley, MD;  Location: [facility];  Service: Gynecology;  Laterality: N/A;  . Abdominal hysterectomy N/A 11/03/2012    Procedure: HYSTERECTOMY ABDOMINAL;  Surgeon: Ena Dawley, MD;  Location: [facility];  Service: Gynecology;  Laterality: N/A;  . Bilateral salpingectomy Bilateral 11/03/2012    Procedure: BILATERAL SALPINGECTOMY;  Surgeon: Ena Dawley, MD;  Location: [facility];  Service: Gynecology;  Laterality: Bilateral;  . Burch procedure N/A 11/03/2012    Procedure: BURCH PROCEDURE;  Surgeon: Ena Dawley, MD;  Location: [facility];  Service: Gynecology;  Laterality: N/A;  . Cystoscopy N/A 11/03/2012    Procedure: CYSTOSCOPY;  Surgeon: Ena Dawley, MD;  Location: [facility];  Service: Gynecology;  Laterality: N/A;   Family History  Problem Relation Age of Onset  . Hypertension Father   . Heart disease Mother   . Rheumatic fever Mother   . Hypertension Mother   . Stroke Mother   . Hypertension Brother   . Stroke Brother   . Heart disease Paternal Aunt   . Sickle cell trait Daughter    History  Substance Use Topics  . Smoking status: Never Smoker   . Smokeless tobacco:  Never Used  . Alcohol Use: Yes     Comment: SOCIAL   OB History   Grav Para Term Preterm Abortions TAB SAB Ect Mult Living   6 3 3  3     3      Review of Systems  Constitutional: Positive for fever.  HENT: Negative for sore throat.   Eyes: Negative for visual disturbance.  Respiratory: Positive for shortness of breath.   Cardiovascular: Positive for chest pain. Negative for leg swelling.  Gastrointestinal: Negative for abdominal pain and blood in stool.  Endocrine: Negative for polyuria.  Genitourinary: Negative for dysuria and pelvic pain.  Musculoskeletal:  Negative for back pain and neck pain.  Skin: Negative for rash.  Neurological: Positive for headaches. Negative for weakness and numbness.      Allergies  Other and Penicillins  Home Medications   Prior to Admission medications   Medication Sig Start Date End Date Taking? Authorizing Provider  amLODipine (NORVASC) 10 MG tablet Take 10 mg by mouth daily.   Yes Historical Provider, MD  FLUoxetine (PROZAC) 20 MG tablet Take 20 mg by mouth daily.   Yes Historical Provider, MD  GARCINIA CAMBOGIA-CHROMIUM PO Take 1 tablet by mouth daily.   Yes Historical Provider, MD  glyBURIDE (DIABETA) 5 MG tablet Take 10 mg by mouth daily at 6 (six) AM. 11/05/12  Yes Earnstine Regal, PA-C  lisinopril-hydrochlorothiazide (PRINZIDE,ZESTORETIC) 20-12.5 MG per tablet Take 1 tablet by mouth daily.   Yes Historical Provider, MD  metFORMIN (GLUCOPHAGE) 500 MG tablet Take 500 mg by mouth 2 (two) times daily with a meal.   Yes Historical Provider, MD  cefUROXime (CEFTIN) 250 MG tablet Take 1 tablet (250 mg total) by mouth 2 (two) times daily with a meal. 01/01/14   Viona Gilmore Hope Holst, PA-C  ciprofloxacin (CIPRO) 500 MG tablet Take 500 mg by mouth See admin instructions. Take for 10days only patient completed course of medication back in March 2015 for swelling and congestion per patient    Historical Provider, MD  HYDROcodone-acetaminophen (NORCO) 5-325 MG per tablet Take 2 tablets by mouth every 4 (four) hours as needed. 01/01/14   Verl Dicker, PA-C  ondansetron (ZOFRAN ODT) 4 MG disintegrating tablet 4mg  ODT q4 hours prn nausea/vomit 01/01/14   Viona Gilmore Regenia Erck, PA-C   BP 106/50  Pulse 70  Temp(Src) 98.6 F (37 C) (Oral)  Resp 18  SpO2 96%  LMP 06/29/2012 Physical Exam  Nursing note and vitals reviewed. Constitutional: She is oriented to person, place, and time. She appears well-developed and well-nourished.  HENT:  Head: Normocephalic and atraumatic.  Mouth/Throat: Oropharynx is clear and moist.  Eyes:  Conjunctivae are normal. Pupils are equal, round, and reactive to light. Right eye exhibits no discharge. Left eye exhibits no discharge. No scleral icterus.  Neck: Neck supple.  Cardiovascular: Normal rate, regular rhythm, normal heart sounds and intact distal pulses.   Pulmonary/Chest: No respiratory distress. She has no wheezes. She has no rales.  Patient does have increased effort to breathe, but is able to speak in full sentences. No adventitious lung sounds heard.  Abdominal: Soft. There is no tenderness.  Musculoskeletal: She exhibits no tenderness.  No leg swelling.  Neurological: She is alert and oriented to person, place, and time.  Cranial Nerves II-XII grossly intact  Skin: Skin is warm and dry. No rash noted.  Psychiatric: She has a normal mood and affect.  Patient is very anxious as she gives detailed history. She is concerned since she had  preeclampsia during her pregnancy with her now 27 year old daughter, she is at risk for seizures and confusion.    ED Course  Procedures (including critical care time) Labs Review Labs Reviewed  CBC WITH DIFFERENTIAL - Abnormal; Notable for the following:    Platelets 141 (*)    Lymphocytes Relative 11 (*)    Monocytes Relative 14 (*)    Monocytes Absolute 1.4 (*)    All other components within normal limits  COMPREHENSIVE METABOLIC PANEL - Abnormal; Notable for the following:    Sodium 130 (*)    Potassium 3.4 (*)    Chloride 92 (*)    Glucose, Bld 231 (*)    GFR calc non Af Amer 66 (*)    GFR calc Af Amer 77 (*)    Anion gap 18 (*)    All other components within normal limits  URINALYSIS, ROUTINE W REFLEX MICROSCOPIC - Abnormal; Notable for the following:    Color, Urine RED (*)    APPearance TURBID (*)    Hgb urine dipstick LARGE (*)    Bilirubin Urine SMALL (*)    Ketones, ur 40 (*)    Protein, ur 100 (*)    Nitrite POSITIVE (*)    Leukocytes, UA SMALL (*)    All other components within normal limits  URINE  MICROSCOPIC-ADD ON - Abnormal; Notable for the following:    Squamous Epithelial / LPF FEW (*)    Bacteria, UA MANY (*)    All other components within normal limits  URINE CULTURE  I-STAT TROPOININ, ED  I-STAT CG4 LACTIC ACID, ED    Imaging Review Dg Chest 2 View  01/01/2014   CLINICAL DATA:  Initial encounter for shortness of breath and fever. Chest pain beginning laterally extending to the center of the chest. Nausea and vomiting. Migraine headaches. The symptoms have been present for 3-4 days. Personal history of hypertension and diabetes. Morbid obesity.  EXAM: CHEST  2 VIEW  COMPARISON:  Two-view chest x-ray and CTA chest 09/26/2013  FINDINGS: The heart is mildly enlarged without significantly changed. There is no edema or effusion to suggest failure. The lungs are clear. The visualized soft tissues and bony thorax are unremarkable.  IMPRESSION: 1. Stable cardiomegaly without failure. 2. No acute cardiopulmonary disease.   Electronically Signed   By: Lawrence Santiago M.D.   On: 01/01/2014 18:52     EKG Interpretation   Date/Time:  Sunday January 01 2014 17:12:05 EDT Ventricular Rate:  106 PR Interval:  138 QRS Duration: 74 QT Interval:  354 QTC Calculation: 470 R Axis:   -12 Text Interpretation:  Sinus tachycardia Abnormal ECG Since last tracing  rate faster Confirmed by KNAPP  MD-J, JON ([id number]) on 01/01/2014 5:22:39 PM     Meds given in ED:  Medications  acetaminophen (TYLENOL) tablet 650 mg (650 mg Oral Given 01/01/14 1748)  ipratropium-albuterol (DUONEB) 0.5-2.5 (3) MG/3ML nebulizer solution 3 mL (3 mLs Nebulization Given 01/01/14 1831)  ondansetron (ZOFRAN) injection 4 mg (4 mg Intravenous Given 01/01/14 1754)  sodium chloride 0.9 % bolus 1,000 mL (0 mLs Intravenous Stopped 01/01/14 2059)  cefTRIAXone (ROCEPHIN) 2 g in dextrose 5 % 50 mL IVPB (0 g Intravenous Stopped 01/01/14 2034)  ondansetron (ZOFRAN) injection 4 mg (4 mg Intravenous Given 01/01/14 2032)  morphine 4 MG/ML  injection 4 mg (4 mg Intravenous Given 01/01/14 2032)    Discharge Medication List as of 01/01/2014  8:55 PM    START taking these medications   Details  cefUROXime (  CEFTIN) 250 MG tablet Take 1 tablet (250 mg total) by mouth 2 (two) times daily with a meal., Starting 01/01/2014, Until Discontinued, Print    HYDROcodone-acetaminophen (NORCO) 5-325 MG per tablet Take 2 tablets by mouth every 4 (four) hours as needed., Starting 01/01/2014, Until Discontinued, Print    ondansetron (ZOFRAN ODT) 4 MG disintegrating tablet 4mg  ODT q4 hours prn nausea/vomit, Print       Filed Vitals:   01/01/14 1713 01/01/14 1902 01/01/14 2030 01/01/14 2100  BP: 145/92  106/60 106/50  Pulse: 98 82 75 70  Temp: 103.1 F (39.5 C) 99.2 F (37.3 C)  98.6 F (37 C)  TempSrc:  Oral  Oral  Resp: 18  16 18   SpO2: 96% 95% 98% 96%    MDM  Vitals stable - WNL -afebrile after administration of acetaminophen. Pt resting comfortably in ED. says she feels much better after breathing treatment, fluids and Zofran. She is tolerating PO. PE shows no evidence of volume overload. Patient shows no sign of respiratory distress, saturations 96% Urinalysis consistent with pyelonephritis, will treat with Rocephin in ED and discharge with cefuroxime. Chest x-ray shows no acute cardiopulmonary pathology. Stable cardiomegaly. Negative troponin  Will DC with cefuroxime, Zofran for nausea, hydrocodone for pain Discussed f/u with PCP at [facility] practice and return precautions, pt very amenable to plan. Patient stable, in good condition and is appropriate for discharge  Prior to patient discharge, I discussed and reviewed this case with Dr. Dorie Rank     Final diagnoses:  Pyelonephritis  SOB (shortness of breath)       Verl Dicker, PA-C 01/01/14 2128

## 2014-01-01 NOTE — ED Provider Notes (Signed)
Pt's evaluation is consistent with pyelonephritis.   Neck is supple on exam.  Doubt meningitis.  IV rocephin given in the ED.  Dc home with oral abx and pain meds.  No vomiting in the ED.  Tolerating oral fluids. Follow up with PCP  Medical screening examination/treatment/procedure(s) were conducted as a shared visit with non-physician practitioner(s) and myself.  I personally evaluated the patient during the encounter.   EKG Interpretation   Date/Time:  Sunday January 01 2014 17:12:05 EDT Ventricular Rate:  106 PR Interval:  138 QRS Duration: 74 QT Interval:  354 QTC Calculation: 470 R Axis:   -12 Text Interpretation:  Sinus tachycardia Abnormal ECG Since last tracing  rate faster Confirmed by Byrd Rushlow  MD-J, Vikrant Pryce ([id number]) on 01/01/2014 5:22:39 PM       Dorie Rank, MD 01/01/14 2137

## 2014-01-01 NOTE — ED Notes (Signed)
Pt sts she took 400 ibuprofen 2 hours ago.

## 2014-01-01 NOTE — ED Notes (Signed)
Pt discharged to home with friend after verbalizing understanding of discharge instructions. Pain reduced to 4/10; NAD noted.

## 2014-01-01 NOTE — ED Notes (Signed)
Presents with SOB, nausea, vomiting, diarrhea, headache x 3 days. States that she is diabetic and has not had her medication since Thursday or Friday because she was sick. Pt has hx of HTN. Has had some blurred vision today. States she has had SOB in some form since May. Her temperature increase prompted visit to ED today. Pt is anxious and tearful.

## 2014-01-01 NOTE — ED Notes (Signed)
Per pt sts HA, chest pain, abd pain, N,V, fever since Friday.

## 2014-01-01 NOTE — ED Provider Notes (Signed)
Patient presents to the emergency room with complaints of headache, coughing, chest pain, fevers since last Friday. The symptoms have been progressing and not getting any better. She has been bringing up mucus when she coughs.  Chest history of recurrent headaches and this headache feels similar to that. She denies any neck pain or rashes Physical Exam  BP 145/92  Pulse 98  Temp(Src) 103.1 F (39.5 C)  Resp 18  SpO2 96%  LMP 06/29/2012  Physical Exam  Nursing note and vitals reviewed. Constitutional: No distress.  HENT:  Head: Normocephalic and atraumatic.  Right Ear: External ear normal.  Left Ear: External ear normal.  Eyes: Conjunctivae are normal. Right eye exhibits no discharge. Left eye exhibits no discharge. No scleral icterus.  Neck: Normal range of motion. Neck supple. No rigidity. No tracheal deviation present. No Kernig's sign noted.  Cardiovascular: Normal rate.   Pulmonary/Chest: Effort normal and breath sounds normal. No stridor. No respiratory distress. She has no wheezes.  Musculoskeletal: She exhibits no edema.  Neurological: She is alert. Cranial nerve deficit: no gross deficits.  Skin: Skin is warm and dry. No rash noted. She is not diaphoretic.  Psychiatric: She has a normal mood and affect.    ED Course  Procedures  MDM Plan on checking labs as well as a chest x-ray. Patient does have a fever and headache but she has no neck stiffness and his symptoms do not suggest meningitis. We'll check for pneumonia.  She may a viral influenza like illness   UA consistent with pyelonephritis. IV abx given in the ED.  No vomiting.  Not septic.  Tolerating PO. Dc home with oral abx     Dorie Rank, MD 01/03/14 [phone]

## 2014-01-03 LAB — URINE CULTURE

## 2014-01-04 ENCOUNTER — Telehealth (HOSPITAL_BASED_OUTPATIENT_CLINIC_OR_DEPARTMENT_OTHER): Payer: Self-pay | Admitting: Emergency Medicine

## 2014-01-04 NOTE — Telephone Encounter (Signed)
Post ED Visit - Positive Culture Follow-up  Culture report reviewed by antimicrobial stewardship pharmacist: []  Wes Jackson, Pharm.D., BCPS []  Heide Guile, Pharm.D., BCPS []  Alycia Rossetti, Pharm.D., BCPS [x]  [place], [place].D., BCPS, AAHIVP []  Legrand Como, Pharm.D., BCPS, AAHIVP []  Carly Sabat, Pharm.D. []  Elenor Quinones, Pharm.D.  Positive Urine culture Treated with Cefuroxime Axetil, organism sensitive to the same and no further patient follow-up is required at this time.  Ernesta Amble 01/04/2014, 2:14 PM

## 2014-01-18 ENCOUNTER — Ambulatory Visit: Payer: 59 | Admitting: Family Medicine

## 2014-01-24 ENCOUNTER — Other Ambulatory Visit (HOSPITAL_COMMUNITY)
Admission: RE | Admit: 2014-01-24 | Discharge: 2014-01-24 | Disposition: A | Discharge status: Home / Self Care | Payer: 59 | Source: Ambulatory Visit | Attending: Family Medicine | Admitting: Family Medicine

## 2014-01-24 ENCOUNTER — Ambulatory Visit: Payer: 59 | Attending: Family Medicine | Admitting: Family Medicine

## 2014-01-24 ENCOUNTER — Encounter: Payer: Self-pay | Admitting: Family Medicine

## 2014-01-24 VITALS — BP 155/102 | HR 67 | Temp 98.7°F | Resp 18 | Ht 65.0 in | Wt 244.0 lb

## 2014-01-24 DIAGNOSIS — N898 Other specified noninflammatory disorders of vagina: Secondary | ICD-10-CM

## 2014-01-24 DIAGNOSIS — Z6841 Body Mass Index (BMI) 40.0 and over, adult: Secondary | ICD-10-CM | POA: Diagnosis not present

## 2014-01-24 DIAGNOSIS — Z90710 Acquired absence of both cervix and uterus: Secondary | ICD-10-CM | POA: Insufficient documentation

## 2014-01-24 DIAGNOSIS — Z113 Encounter for screening for infections with a predominantly sexual mode of transmission: Secondary | ICD-10-CM | POA: Insufficient documentation

## 2014-01-24 DIAGNOSIS — I10 Essential (primary) hypertension: Secondary | ICD-10-CM | POA: Insufficient documentation

## 2014-01-24 DIAGNOSIS — N760 Acute vaginitis: Secondary | ICD-10-CM | POA: Insufficient documentation

## 2014-01-24 DIAGNOSIS — L298 Other pruritus: Secondary | ICD-10-CM

## 2014-01-24 MED ORDER — LISINOPRIL-HYDROCHLOROTHIAZIDE 20-12.5 MG PO TABS: 1.0000 | ORAL_TABLET | Freq: Every day | ORAL | Status: DC

## 2014-01-24 MED ORDER — FLUCONAZOLE 150 MG PO TABS: 150.0000 mg | ORAL_TABLET | ORAL | Status: DC

## 2014-01-24 NOTE — Progress Notes (Signed)
   Subjective:    Patient ID: Julia Dunn, female    DOB: 1970-11-22, 44 y.o.   MRN: [id number] CC: vaginal discharge  HPI 43 yo F presents to establish care and discuss the following:  1. Vaginal discharge: after 10 days of antibiotics for UTI. With itching. No lesions.   2. HTN: compliant with BP medications. No HA. No CP. Some SOB. Out of prinzide x one week.   Med Hx: DM2 Soc Hx: non smoker  Surg Hx: total hysterectomy for uterine fibroids   Review of Systems As per HPI      Objective:   Physical Exam BP 155/102  Pulse 67  Temp(Src) 98.7 F (37.1 C) (Oral)  Resp 18  Ht 5\' 5"  (1.651 m)  Wt 244 lb (110.678 kg)  BMI 40.60 kg/m2  SpO2 98%  LMP 06/29/2012 BP Readings from Last 3 Encounters:  01/24/14 155/102  01/01/14 106/50  09/27/13 187/100  General appearance: alert, cooperative and no distress Lungs: clear to auscultation bilaterally Heart: regular rate and rhythm, S1, S2 normal, no murmur, click, rub or gallop Pelvic: external genitalia normal, positive findings: vaginal discharge:  white and uterus surgically absent     Assessment & Plan:

## 2014-01-24 NOTE — Patient Instructions (Signed)
Julia Dunn,  Thank you for coming in today. It was good to see you.  1. Restart prinzide, sent to onsite pharmacy. If needed we will increase dose to 40-25 mg daily.  Continue norvasc 10 mg daily.  2. For discharge: take diflucan for suspected yeast.  You will be called lab results.  Schedule a diabetes f/u visit with me.   Dr. Adrian Blackwater

## 2014-01-24 NOTE — Assessment & Plan Note (Signed)
A: elevated above goal Meds: non compliant with prinzide P: Restart prinzide 20-12.5 mg daily  Continue norvasc 10 mg  Close f/u

## 2014-01-24 NOTE — Progress Notes (Signed)
Establish Care Complaining of vaginal itching and discharge Possible due to antibiotic side effect.

## 2014-01-24 NOTE — Assessment & Plan Note (Addendum)
A: vaginal itching post Abx with discharge P; Wet prep, Gc/chlam  Diflucan now  BV on wet prep flagyl sent in

## 2014-01-26 ENCOUNTER — Telehealth: Payer: Self-pay | Admitting: *Deleted

## 2014-01-26 LAB — CERVICOVAGINAL ANCILLARY ONLY
Chlamydia: NEGATIVE
NEISSERIA GONORRHEA: NEGATIVE
Wet Prep (BD Affirm): NEGATIVE
Wet Prep (BD Affirm): NEGATIVE
Wet Prep (BD Affirm): POSITIVE — AB

## 2014-01-26 MED ORDER — METRONIDAZOLE 500 MG PO TABS
500.0000 mg | ORAL_TABLET | Freq: Two times a day (BID) | ORAL | Status: DC
Start: 2014-01-26 — End: 2014-03-11

## 2014-01-26 NOTE — Telephone Encounter (Signed)
Message copied by Betti Cruz on Thu Jan 26, 2014 12:06 PM ------      Message from: Boykin Nearing      Created: Thu Jan 26, 2014  8:38 AM       Wet prep with BV no yeast, flagy sent in pharmacy ------

## 2014-01-26 NOTE — Telephone Encounter (Signed)
Left voice message to return call, Notified Medication Rx at the pharmacy

## 2014-01-26 NOTE — Addendum Note (Signed)
Addended by: Boykin Nearing on: 01/26/2014 08:40 AM   Modules accepted: Orders

## 2014-01-28 ENCOUNTER — Other Ambulatory Visit: Payer: Self-pay | Admitting: Family Medicine

## 2014-01-30 ENCOUNTER — Encounter: Payer: Self-pay | Admitting: Family Medicine

## 2014-02-12 ENCOUNTER — Other Ambulatory Visit: Payer: Self-pay | Admitting: Family Medicine

## 2014-03-11 ENCOUNTER — Inpatient Hospital Stay (HOSPITAL_COMMUNITY)
Admission: AD | Admit: 2014-03-11 | Discharge: 2014-03-11 | Disposition: A | Discharge status: Home / Self Care | Payer: 59 | Source: Ambulatory Visit | Attending: Family Medicine | Admitting: Family Medicine

## 2014-03-11 ENCOUNTER — Encounter (HOSPITAL_COMMUNITY): Payer: Self-pay | Admitting: *Deleted

## 2014-03-11 DIAGNOSIS — Z90710 Acquired absence of both cervix and uterus: Secondary | ICD-10-CM | POA: Diagnosis not present

## 2014-03-11 DIAGNOSIS — B373 Candidiasis of vulva and vagina: Secondary | ICD-10-CM

## 2014-03-11 DIAGNOSIS — B3731 Acute candidiasis of vulva and vagina: Secondary | ICD-10-CM

## 2014-03-11 DIAGNOSIS — I10 Essential (primary) hypertension: Secondary | ICD-10-CM | POA: Diagnosis not present

## 2014-03-11 DIAGNOSIS — L292 Pruritus vulvae: Secondary | ICD-10-CM | POA: Diagnosis not present

## 2014-03-11 DIAGNOSIS — E119 Type 2 diabetes mellitus without complications: Secondary | ICD-10-CM | POA: Insufficient documentation

## 2014-03-11 DIAGNOSIS — L293 Anogenital pruritus, unspecified: Secondary | ICD-10-CM | POA: Diagnosis present

## 2014-03-11 LAB — URINE MICROSCOPIC-ADD ON

## 2014-03-11 LAB — WET PREP, GENITAL
Clue Cells Wet Prep HPF POC: NONE SEEN
Trich, Wet Prep: NONE SEEN
Yeast Wet Prep HPF POC: NONE SEEN

## 2014-03-11 LAB — URINALYSIS, ROUTINE W REFLEX MICROSCOPIC
Bilirubin Urine: NEGATIVE
GLUCOSE, UA: 500 mg/dL — AB
Ketones, ur: NEGATIVE mg/dL
Leukocytes, UA: NEGATIVE
Nitrite: NEGATIVE
PH: 5.5 (ref 5.0–8.0)
Protein, ur: NEGATIVE mg/dL
Specific Gravity, Urine: >1.03 — ABNORMAL HIGH (ref 1.005–1.030)
Urobilinogen, UA: 0.2 mg/dL (ref 0.0–1.0)

## 2014-03-11 LAB — HIV ANTIBODY (ROUTINE TESTING W REFLEX): HIV: NONREACTIVE

## 2014-03-11 LAB — GLUCOSE, CAPILLARY: Glucose-Capillary: 270 mg/dL — ABNORMAL HIGH (ref 70–99)

## 2014-03-11 MED ORDER — FLUCONAZOLE 150 MG PO TABS
150.0000 mg | ORAL_TABLET | Freq: Once | ORAL | Status: AC
  Administered 2014-03-11: 150 mg via ORAL
  Filled 2014-03-11: qty 1

## 2014-03-11 MED ORDER — TERCONAZOLE 0.4 % VA CREA: 1.0000 | TOPICAL_CREAM | Freq: Every day | VAGINAL | Status: DC

## 2014-03-11 MED ORDER — LISINOPRIL-HYDROCHLOROTHIAZIDE 20-12.5 MG PO TABS: 1.0000 | ORAL_TABLET | Freq: Every day | ORAL | Status: DC

## 2014-03-11 MED ORDER — GLYBURIDE 5 MG PO TABS: 10.0000 mg | ORAL_TABLET | Freq: Every day | ORAL | Status: DC

## 2014-03-11 NOTE — MAU Note (Signed)
Patient presents with complaints of vaginal bleeding, itching, swelling, discharge and burning X 1-2 vweeks

## 2014-03-11 NOTE — Discharge Instructions (Signed)

## 2014-03-11 NOTE — MAU Provider Note (Signed)
History     CSN: [id number]  Arrival date and time: 03/11/14 1028   First Provider Initiated Contact with Patient 03/11/14 1124      Chief Complaint  Patient presents with  . Vaginal Bleeding  . Vaginal Itching   HPI   [name] is a 43 y.o. female [phone] who presents with vaginal bleeding and vaginal itching. She has been treated for bacterial vaginosis and yeast in the past month and feels that the itching is getting worse. The bleeding is on the outside of the vagina; she has not noticed any bleeding from the inside.  She was also treated for a UTI and the patient feels like the symptoms are not any better.   Hx of hysterectomy.  History of diabetes; non compliant. Patient states she is lazy.  She ran out of her HCTZ and has not gotten it refilled by her PCP   OB History    Gravida Para Term Preterm AB TAB SAB Ectopic Multiple Living   6 3 3  3 3    3       Past Medical History  Diagnosis Date  . Bacterial vaginosis 2008  . Headache(784.0)     migraines  . Hx of blood clots   . Depression   . Depression   . Postpartum depression   . Trichomonas   . Anemia Dx 2013  . Asthma   . History of blood transfusion 2013/2014  . Diabetes mellitus Dx 2005  . Gestational diabetes   . Hypertension Dx 2015  . Pregnancy induced hypertension   . Kidney infection 2015  . Fibroids 09/05/2011    08/08/2011: Anteverted uterus. Diffuse fibroid involvement. Left of midline fibroid with submucosal component. 3.4cm x 2.8cmx 3.4cm.   . Menorrhagia 09/27/2010    Past Surgical History  Procedure Laterality Date  . Tubal ligation    . Dilation and curettage of uterus    . Laparoscopy N/A 11/03/2012    Procedure: LAPAROSCOPY DIAGNOSTIC;  Surgeon: Ena Dawley, MD;  Location: [facility];  Service: Gynecology;  Laterality: N/A;  . Abdominal hysterectomy N/A 11/03/2012    Procedure: HYSTERECTOMY ABDOMINAL;  Surgeon: Ena Dawley, MD;  Location: [facility];  Service: Gynecology;   Laterality: N/A;  . Bilateral salpingectomy Bilateral 11/03/2012    Procedure: BILATERAL SALPINGECTOMY;  Surgeon: Ena Dawley, MD;  Location: [facility];  Service: Gynecology;  Laterality: Bilateral;  . Burch procedure N/A 11/03/2012    Procedure: BURCH PROCEDURE;  Surgeon: Ena Dawley, MD;  Location: [facility];  Service: Gynecology;  Laterality: N/A;  . Cystoscopy N/A 11/03/2012    Procedure: CYSTOSCOPY;  Surgeon: Ena Dawley, MD;  Location: [facility];  Service: Gynecology;  Laterality: N/A;    Family History  Problem Relation Age of Onset  . Hypertension Father   . Heart disease Mother   . Rheumatic fever Mother   . Hypertension Mother   . Stroke Mother   . Hypertension Brother   . Stroke Brother   . Heart disease Paternal Aunt   . Sickle cell trait Daughter     History  Substance Use Topics  . Smoking status: Never Smoker   . Smokeless tobacco: Never Used  . Alcohol Use: Yes     Comment: SOCIAL    Allergies:  Allergies  Allergen Reactions  . Other Swelling    Patient says she is unsure of caused the swelling but one of the meds given to her on the 8/6 for her surgery had her extreamly swollen   .  Penicillins Hives    REACTION: unspecified    Prescriptions prior to admission  Medication Sig Dispense Refill Last Dose  . amLODipine (NORVASC) 10 MG tablet Take 10 mg by mouth daily.   03/11/2014 at Unknown time  . FLUoxetine (PROZAC) 20 MG tablet TAKE ONE TABLET BY MOUTH ONCE DAILY 30 tablet 0 Past Month at Unknown time  . glyBURIDE (DIABETA) 5 MG tablet Take 10 mg by mouth daily at 6 (six) AM.   Past Month at Unknown time  . HYDROcodone-acetaminophen (NORCO) 5-325 MG per tablet Take 2 tablets by mouth every 4 (four) hours as needed. 20 tablet 0 Past Month at Unknown time  . lisinopril-hydrochlorothiazide (PRINZIDE,ZESTORETIC) 20-12.5 MG per tablet Take 1 tablet by mouth daily. 30 tablet 3 Past Week at Unknown time  . metFORMIN (GLUCOPHAGE) 500 MG tablet Take 500 mg by mouth 2  (two) times daily with a meal.   03/11/2014 at 0800  . fluconazole (DIFLUCAN) 150 MG tablet Take 1 tablet (150 mg total) by mouth every 3 (three) days. (Patient not taking: Reported on 03/11/2014) 2 tablet 0   . metroNIDAZOLE (FLAGYL) 500 MG tablet Take 1 tablet (500 mg total) by mouth 2 (two) times daily. (Patient not taking: Reported on 03/11/2014) 14 tablet 0     Results for orders placed or performed during the hospital encounter of 03/11/14 (from the past 48 hour(s))  Urinalysis, Routine w reflex microscopic     Status: Abnormal   Collection Time: 03/11/14 10:37 AM  Result Value Ref Range   Color, Urine YELLOW YELLOW   APPearance CLEAR CLEAR   Specific Gravity, Urine >1.030 (H) 1.005 - 1.030   pH 5.5 5.0 - 8.0   Glucose, UA 500 (A) NEGATIVE mg/dL   Hgb urine dipstick LARGE (A) NEGATIVE   Bilirubin Urine NEGATIVE NEGATIVE   Ketones, ur NEGATIVE NEGATIVE mg/dL   Protein, ur NEGATIVE NEGATIVE mg/dL   Urobilinogen, UA 0.2 0.0 - 1.0 mg/dL   Nitrite NEGATIVE NEGATIVE   Leukocytes, UA NEGATIVE NEGATIVE  Urine microscopic-add on     Status: Abnormal   Collection Time: 03/11/14 10:37 AM  Result Value Ref Range   Squamous Epithelial / LPF MANY (A) RARE   RBC / HPF 21-50 <3 RBC/hpf   Bacteria, UA FEW (A) RARE   Urine-Other MUCOUS PRESENT   Glucose, capillary     Status: Abnormal   Collection Time: 03/11/14 12:01 PM  Result Value Ref Range   Glucose-Capillary 270 (H) 70 - 99 mg/dL  Wet prep, genital     Status: Abnormal   Collection Time: 03/11/14 12:05 PM  Result Value Ref Range   Yeast Wet Prep HPF POC NONE SEEN NONE SEEN   Trich, Wet Prep NONE SEEN NONE SEEN   Clue Cells Wet Prep HPF POC NONE SEEN NONE SEEN   WBC, Wet Prep HPF POC RARE (A) NONE SEEN    Comment: FEW BACTERIA SEEN  Herpes simplex virus culture     Status: None (Preliminary result)   Collection Time: 03/11/14 12:08 PM  Result Value Ref Range   Specimen Description CERVIX    Special Requests NONE    Culture       Culture has been initiated. Performed at [facility]    Report Status PENDING   HIV antibody     Status: None   Collection Time: 03/11/14 12:19 PM  Result Value Ref Range   HIV 1&2 Ab, 4th Generation NONREACTIVE NONREACTIVE    Comment: (NOTE) A NONREACTIVE HIV  Ag/Ab result does not exclude HIV infection since the time frame for seroconversion is variable. If acute HIV infection is suspected, a HIV-1 RNA Qualitative TMA test is recommended. HIV-1/2 Antibody Diff         Not indicated. HIV-1 RNA, Qual TMA           Not indicated. PLEASE NOTE: This information has been disclosed to you from records whose confidentiality may be protected by state law. If your state requires such protection, then the state law prohibits you from making any further disclosure of the information without the specific written consent of the person to whom it pertains, or as otherwise permitted by law. A general authorization for the release of medical or other information is NOT sufficient for this purpose. The performance of this assay has not been clinically validated in patients less than 11 years old. Performed at [facility]  Constitutional: Negative for fever and chills.  Genitourinary: Positive for dysuria, urgency and frequency.       +open sore on vagina   Musculoskeletal: Positive for back pain.   Physical Exam   Blood pressure 158/102, pulse 71, temperature 98 F (36.7 C), temperature source Oral, resp. rate 18, height 5\' 5"  (1.651 m), weight 117.141 kg (258 lb 4 oz), last menstrual period 06/29/2012.  Physical Exam  Constitutional: She is oriented to person, place, and time. She appears well-developed and well-nourished. No distress.  HENT:  Head: Normocephalic.  Eyes: Pupils are equal, round, and reactive to light.  Neck: Neck supple.  Respiratory: Effort normal.  GI: Soft. She exhibits no distension. There is no tenderness. There is no rebound.    Genitourinary:    There is tenderness and injury on the left labia.  Speculum exam: Vagina - Small amount of creamy, thick, white discharge, no odor. No blood noted  Bimanual exam: Cervix surgically absent, cuff present   Uterus, surgically absent  GC/Chlam, wet prep done, HSV  Chaperone present for exam.   Musculoskeletal: Normal range of motion.  Neurological: She is alert and oriented to person, place, and time. She has normal strength. GCS eye subscore is 4. GCS verbal subscore is 5. GCS motor subscore is 6.  Skin: Skin is warm. She is not diaphoretic.  Psychiatric: Her behavior is normal.    MAU Course  Procedures  None   MDM CBG Diflucan 150 mg PO in MAU  HSV culture pending; suspicion is low. Urine culture pending     Assessment and Plan   A:  1. Yeast vaginitis   2. HYPERTENSION, BENIGN SYSTEMIC   3. Pruritus vulvae    P:  Discharge home in stable condition RX: HCTZ and glyburide Recommended replens over the counter  Patient was counseled and highly encouraged to call PCP for medication refill and discussion with PCP about diabetes management.  Stroke and MI precautions; patient to go to [facility] ED with any onset of CP, severe HA, blurred vision. Patient voices understanding   Darrelyn Hillock Rasch, NP 03/12/2014 2:52 PM

## 2014-03-12 LAB — URINE CULTURE

## 2014-03-13 LAB — HERPES SIMPLEX VIRUS CULTURE: Culture: NOT DETECTED

## 2014-03-13 LAB — GC/CHLAMYDIA PROBE AMP
CT Probe RNA: NEGATIVE
GC Probe RNA: NEGATIVE

## 2014-03-29 ENCOUNTER — Telehealth: Payer: Self-pay | Admitting: Family Medicine

## 2014-03-29 NOTE — Telephone Encounter (Signed)
Patient has called in today to see if she can have a script refill for Amlodipine 10 mg Tablet and Fluoxetine 20 mg Tablets; patient mentioned her phone is inconsistent, However she will be off of work tomorrow; please f/u with patient

## 2014-03-30 ENCOUNTER — Telehealth: Payer: Self-pay | Admitting: Family Medicine

## 2014-03-30 NOTE — Telephone Encounter (Signed)
Patient came into facility to request a med refill for Fluoxetine, and Amlodipine. Patient has an upcoming appointment on 04/06/2014 but needs enough medication to last her until then. Patient would like to start using our pharmacy. Patient also states that her phone is not currently taking calls but to leave a message. Please f/u.

## 2014-04-03 ENCOUNTER — Other Ambulatory Visit: Payer: Self-pay | Admitting: *Deleted

## 2014-04-03 MED ORDER — AMLODIPINE BESYLATE 10 MG PO TABS: 10.0000 mg | ORAL_TABLET | Freq: Every day | ORAL | Status: DC

## 2014-04-03 MED ORDER — FLUOXETINE HCL 20 MG PO TABS: 20.0000 mg | ORAL_TABLET | Freq: Every day | ORAL | Status: DC

## 2014-04-03 NOTE — Telephone Encounter (Signed)
Left voice message, medication refills send to [facility]

## 2014-04-06 ENCOUNTER — Encounter: Payer: Self-pay | Admitting: Family Medicine

## 2014-04-06 ENCOUNTER — Ambulatory Visit: Payer: 59 | Attending: Family Medicine | Admitting: Family Medicine

## 2014-04-06 VITALS — BP 159/110 | HR 58 | Temp 98.2°F | Resp 18 | Ht 65.0 in | Wt 263.0 lb

## 2014-04-06 DIAGNOSIS — E118 Type 2 diabetes mellitus with unspecified complications: Secondary | ICD-10-CM | POA: Insufficient documentation

## 2014-04-06 DIAGNOSIS — R3129 Other microscopic hematuria: Secondary | ICD-10-CM

## 2014-04-06 DIAGNOSIS — F329 Major depressive disorder, single episode, unspecified: Secondary | ICD-10-CM | POA: Insufficient documentation

## 2014-04-06 DIAGNOSIS — G4733 Obstructive sleep apnea (adult) (pediatric): Secondary | ICD-10-CM

## 2014-04-06 DIAGNOSIS — E559 Vitamin D deficiency, unspecified: Secondary | ICD-10-CM

## 2014-04-06 DIAGNOSIS — Z6841 Body Mass Index (BMI) 40.0 and over, adult: Secondary | ICD-10-CM | POA: Insufficient documentation

## 2014-04-06 DIAGNOSIS — I10 Essential (primary) hypertension: Secondary | ICD-10-CM | POA: Diagnosis not present

## 2014-04-06 DIAGNOSIS — E119 Type 2 diabetes mellitus without complications: Secondary | ICD-10-CM

## 2014-04-06 DIAGNOSIS — R312 Other microscopic hematuria: Secondary | ICD-10-CM

## 2014-04-06 LAB — POCT URINALYSIS DIPSTICK
Bilirubin, UA: NEGATIVE
GLUCOSE UA: NEGATIVE
KETONES UA: NEGATIVE
NITRITE UA: NEGATIVE
Protein, UA: 30
UROBILINOGEN UA: 0.2
pH, UA: 5.5

## 2014-04-06 LAB — BASIC METABOLIC PANEL
BUN: 11 mg/dL (ref 6–23)
CHLORIDE: 103 meq/L (ref 96–112)
CO2: 29 meq/L (ref 19–32)
CREATININE: 0.81 mg/dL (ref 0.50–1.10)
Calcium: 9.2 mg/dL (ref 8.4–10.5)
GLUCOSE: 186 mg/dL — AB (ref 70–99)
Potassium: 4.5 mEq/L (ref 3.5–5.3)
SODIUM: 141 meq/L (ref 135–145)

## 2014-04-06 LAB — GLUCOSE, POCT (MANUAL RESULT ENTRY): POC Glucose: 219 mg/dl — AB (ref 70–99)

## 2014-04-06 LAB — TSH: TSH: 0.885 u[IU]/mL (ref 0.350–4.500)

## 2014-04-06 LAB — POCT GLYCOSYLATED HEMOGLOBIN (HGB A1C): HEMOGLOBIN A1C: 9.5

## 2014-04-06 MED ORDER — ONETOUCH ULTRASOFT LANCETS MISC: 1.0000 | Freq: Every day | Status: DC

## 2014-04-06 MED ORDER — INSULIN PEN NEEDLE 31G X 8 MM MISC: 1.0000 "application " | Freq: Every day | Status: DC

## 2014-04-06 MED ORDER — METFORMIN HCL ER 500 MG PO TB24: 1000.0000 mg | ORAL_TABLET | Freq: Every day | ORAL | Status: DC

## 2014-04-06 MED ORDER — GLUCOSE BLOOD VI STRP: 1.0000 | ORAL_STRIP | Freq: Every day | Status: DC

## 2014-04-06 MED ORDER — INSULIN GLARGINE 300 UNIT/ML ~~LOC~~ SOPN: 10.0000 [IU] | PEN_INJECTOR | Freq: Every day | SUBCUTANEOUS | Status: DC

## 2014-04-06 MED ORDER — LISINOPRIL-HYDROCHLOROTHIAZIDE 20-12.5 MG PO TABS: 2.0000 | ORAL_TABLET | Freq: Every day | ORAL | Status: DC

## 2014-04-06 MED ORDER — PHENTERMINE-TOPIRAMATE ER 3.75-23 MG PO CP24: 1.0000 | ORAL_CAPSULE | Freq: Every day | ORAL | Status: DC

## 2014-04-06 NOTE — Progress Notes (Signed)
Pt comes in to f/u with HTN,DM with medication management Pt is non compliant with taking medication  Ran out of BP/DM meds  BP- 159/110 58 C/o frontal migraine headache Numbness in both hands noted CBg- 219/ A1c-9.5

## 2014-04-06 NOTE — Progress Notes (Signed)
   Subjective:    Patient ID: Julia Dunn, female    DOB: 02/13/1971, 44 y.o.   MRN: [id number] CC: f/u HTN, DM2, obesity  HPI 44 yo F with depression presents to discuss the following:  1. DM2: not taking metformin or amaryl. Eats chocolate. Is exercising. Does not check CBGs. Sad about weight gain. Denies CP, SOB, polyuria and polydipsia.   2. HTN: not taking prinzide. ROS as above.   3. Obesity: tearful and sad about weight gain. Admits to overeating. Feels like metformin helped with weightloss but then stopped working. Request Qsymia, reports a coworker had good results.   Soc Hx: chronic non smoker  Review of Systems As per HPI     Objective:   Physical Exam BP 159/110 mmHg  Pulse 58  Temp(Src) 98.2 F (36.8 C) (Oral)  Resp 18  Ht 5\' 5"  (1.651 m)  Wt 263 lb (119.296 kg)  BMI 43.77 kg/m2  SpO2 98%  LMP 06/29/2012 General appearance: alert, cooperative, no distress and morbidly obese Lungs: clear to auscultation bilaterally Heart: regular rate and rhythm, S1, S2 normal, no murmur, click, rub or gallop Extremities: extremities normal, atraumatic, no cyanosis or edema   Lab Results  Component Value Date   HGBA1C 9.50 04/06/2014       Assessment & Plan:

## 2014-04-06 NOTE — Patient Instructions (Addendum)
Julia Dunn,  Thank you for coming in to see me today.  1. Diabetes: your A1c is on the rise. Goal is 7 or less.  Start toujeo once daily long acting insulin, 10 U. Metformin XR 1000 mg after supper Sugar free chocolate if having cravings.  Weight loss.   2. Hypertension: Restart prinzide 20-12.5 mg take one pill once daily for first week, then two pills once daily.   3. Obesity: You have lost weight before and you will be successful again. Continue your exercise regimen Restart metformin Referral to nutritionist New medication-Qysmia. Once daily. There is a risk of elevated heart rate, lowering blood pressure and negative effect on kidneys. So we must monitor closely. Be mindful to stay well hydrated with water.   F/u in 2 weeks with nurse of BP and HR check  F/u with me in 4 weeks for HTN and weight check  Dr. Adrian Blackwater

## 2014-04-07 ENCOUNTER — Encounter: Payer: Self-pay | Admitting: Family Medicine

## 2014-04-07 DIAGNOSIS — R3129 Other microscopic hematuria: Secondary | ICD-10-CM | POA: Insufficient documentation

## 2014-04-07 LAB — VITAMIN D 25 HYDROXY (VIT D DEFICIENCY, FRACTURES): VIT D 25 HYDROXY: 22 ng/mL — AB (ref 30–100)

## 2014-04-07 MED ORDER — INSULIN GLARGINE 100 UNIT/ML SOLOSTAR PEN: 10.0000 [IU] | PEN_INJECTOR | Freq: Every day | SUBCUTANEOUS | Status: DC

## 2014-04-07 MED ORDER — VITAMIN D3 25 MCG (1000 UNIT) PO TABS: 1000.0000 [IU] | ORAL_TABLET | Freq: Every day | ORAL | Status: DC

## 2014-04-07 NOTE — Assessment & Plan Note (Signed)
1. Diabetes: your A1c is on the rise. Goal is 7 or less.  Start toujeo once daily long acting insulin, 10 U. Metformin XR 1000 mg after supper Sugar free chocolate if having cravings.  Weight loss.   Switched from toujeo to lantus as lantus and levemir are covered by her insurance

## 2014-04-07 NOTE — Assessment & Plan Note (Signed)
A: persistent microscopic hematuria following tx for UTI in a diabetic P: Repeat UA next visit to evaluate for resolution

## 2014-04-07 NOTE — Assessment & Plan Note (Addendum)
A: level is still low, vit D insufficiency range  P: Vit D supplement

## 2014-04-07 NOTE — Assessment & Plan Note (Signed)
A: Hypertension: uncontrolled Med: non compliant P: Restart prinzide 20-12.5 mg take one pill once daily for first week, then two pills once daily.

## 2014-04-07 NOTE — Assessment & Plan Note (Signed)
A: Obesity: contributing to DM2 and HTN P: You have lost weight before and you will be successful again. Continue your exercise regimen Restart metformin Referral to nutritionist New medication-Qysmia. Once daily. There is a risk of elevated heart rate, lowering blood pressure and negative effect on kidneys. So we must monitor closely. Be mindful to stay well hydrated w

## 2014-04-11 ENCOUNTER — Telehealth: Payer: Self-pay | Admitting: *Deleted

## 2014-04-11 NOTE — Telephone Encounter (Signed)
Left voice message to return call 

## 2014-04-11 NOTE — Telephone Encounter (Signed)
-----   Message from Minerva Ends, MD sent at 04/07/2014  6:54 PM EST ----- Vit D insufficiency. Normal BMP and thyroid studies

## 2014-04-17 ENCOUNTER — Other Ambulatory Visit: Payer: Self-pay | Admitting: Family Medicine

## 2014-04-17 MED ORDER — INSULIN DETEMIR 100 UNIT/ML FLEXPEN: 10.0000 [IU] | PEN_INJECTOR | Freq: Every day | SUBCUTANEOUS | Status: DC

## 2014-05-11 ENCOUNTER — Other Ambulatory Visit: Payer: Self-pay | Admitting: Family Medicine

## 2014-05-16 ENCOUNTER — Other Ambulatory Visit: Payer: Self-pay | Admitting: Family Medicine

## 2014-05-18 ENCOUNTER — Telehealth: Payer: Self-pay | Admitting: Family Medicine

## 2014-05-18 ENCOUNTER — Ambulatory Visit: Payer: 59 | Admitting: Dietician

## 2014-05-18 NOTE — Telephone Encounter (Signed)
Patient called to request a med refill for her blood pressure medication. Please f/u with pt.

## 2014-05-19 ENCOUNTER — Telehealth: Payer: Self-pay | Admitting: Family Medicine

## 2014-05-19 NOTE — Telephone Encounter (Signed)
Received a refill request for norvasc. Patient taking prinzide only at the moment. Please call patient to come in for BP check with nurse.

## 2014-05-19 NOTE — Telephone Encounter (Signed)
Left voice message to return call 

## 2014-05-21 NOTE — Telephone Encounter (Signed)
Pt's BP medicine is prinzide Pt will need f/u with RN for BP check or me if she feels that BP is high and prinzide is not enough (please see last OV note) Please schedule f/u.

## 2014-05-22 NOTE — Telephone Encounter (Signed)
LVM to return call.

## 2014-05-29 ENCOUNTER — Other Ambulatory Visit: Payer: Self-pay | Admitting: Family Medicine

## 2014-05-30 ENCOUNTER — Other Ambulatory Visit: Payer: Self-pay | Admitting: Family Medicine

## 2014-06-02 ENCOUNTER — Other Ambulatory Visit: Payer: Self-pay | Admitting: Family Medicine

## 2014-06-19 ENCOUNTER — Ambulatory Visit: Payer: 59 | Attending: Family Medicine | Admitting: Family Medicine

## 2014-06-19 ENCOUNTER — Encounter: Payer: Self-pay | Admitting: Family Medicine

## 2014-06-19 VITALS — BP 160/88 | HR 50 | Temp 98.0°F | Resp 18 | Ht 65.0 in | Wt 256.0 lb

## 2014-06-19 DIAGNOSIS — E119 Type 2 diabetes mellitus without complications: Secondary | ICD-10-CM

## 2014-06-19 DIAGNOSIS — I10 Essential (primary) hypertension: Secondary | ICD-10-CM | POA: Diagnosis not present

## 2014-06-19 DIAGNOSIS — R0600 Dyspnea, unspecified: Secondary | ICD-10-CM

## 2014-06-19 LAB — GLUCOSE, POCT (MANUAL RESULT ENTRY): POC Glucose: 108 mg/dl — AB (ref 70–99)

## 2014-06-19 MED ORDER — PHENTERMINE-TOPIRAMATE ER 3.75-23 MG PO CP24: 1.0000 | ORAL_CAPSULE | Freq: Every day | ORAL | Status: DC

## 2014-06-19 MED ORDER — GLUCOSE BLOOD VI STRP: 1.0000 | ORAL_STRIP | Freq: Every day | Status: DC

## 2014-06-19 MED ORDER — INSULIN PEN NEEDLE 31G X 8 MM MISC: 1.0000 "application " | Freq: Every day | Status: DC

## 2014-06-19 MED ORDER — LISINOPRIL-HYDROCHLOROTHIAZIDE 20-12.5 MG PO TABS: 2.0000 | ORAL_TABLET | Freq: Every day | ORAL | Status: DC

## 2014-06-19 MED ORDER — FLUOXETINE HCL 20 MG PO CAPS: 20.0000 mg | ORAL_CAPSULE | Freq: Every day | ORAL | Status: DC

## 2014-06-19 MED ORDER — AMLODIPINE BESYLATE 10 MG PO TABS: 10.0000 mg | ORAL_TABLET | Freq: Every day | ORAL | Status: DC

## 2014-06-19 MED ORDER — METFORMIN HCL ER 500 MG PO TB24: 500.0000 mg | ORAL_TABLET | Freq: Every day | ORAL | Status: DC

## 2014-06-19 MED ORDER — INSULIN GLARGINE 300 UNIT/ML ~~LOC~~ SOPN: 10.0000 [IU] | PEN_INJECTOR | Freq: Every day | SUBCUTANEOUS | Status: DC

## 2014-06-19 MED ORDER — ONETOUCH ULTRASOFT LANCETS MISC: 1.0000 | Freq: Three times a day (TID) | Status: DC

## 2014-06-19 NOTE — Assessment & Plan Note (Addendum)
Diabetes: Continue toujeo 10 U nightly Continue metformin 500 mg nightly.   Diabetes  Check sugars fasting and 2 hrs after supper.  Goal fasting 100  Goal after eating < 160 Beware of hypoglycemia (low blood sugar) which is blood sugar < 70 with or without symptoms

## 2014-06-19 NOTE — Progress Notes (Signed)
F/U DM Stated not taking medication x 2 weeks

## 2014-06-19 NOTE — Assessment & Plan Note (Signed)
Weight loss: continue Qysmia started 06/02/14- stop date 09/02/14

## 2014-06-19 NOTE — Assessment & Plan Note (Signed)
HTN: BP goal < 140/90 Prinzide 20-12.5 mg, two tabs once daily Add norvasc 10 mg daily

## 2014-06-19 NOTE — Patient Instructions (Signed)
Julia Dunn,   Thank you for coming in today.  1. HTN: BP goal < 140/90 Prinzide 20-12.5 mg, two tabs once daily Add norvasc 10 mg daily  2. Diabetes: Continue toujeo 10 U nightly Continue metformin 500 mg nightly.   Diabetes  Check sugars fasting and 2 hrs after supper.  Goal fasting 100  Goal after eating < 160 Beware of hypoglycemia (low blood sugar) which is blood sugar < 70 with or without symptoms  3. Weight loss: continue Qysmia started 06/02/14- stop date 09/02/14  4. Shortness of breath with exertion: I agree that this is most likely related to carrying extra weight as you had a normal Myoview in 08/2013. Referral to cardiology placed.   F/u in 3 weeks with RN for CBG check and A1c F/u with me in 1 months for weight loss and hypertension   Dr. Adrian Blackwater

## 2014-06-19 NOTE — Progress Notes (Signed)
   Subjective:    Patient ID: Julia Dunn, female    DOB: 1970-08-25, 44 y.o.   MRN: [id number] CC: DM2, HTN, obesity  HPI  1. CHRONIC DIABETES  Disease Monitoring  Blood Sugar Ranges: 70-180  Polyuria: no   Visual problems: no   Medication Compliance: yes  Medication Side Effects  Hypoglycemia: no   Preventitive Health Care  Eye Exam: due   Foot Exam: due   Diet pattern: 2-3 meals per day, usually low carb  Exercise: exercising   2. CHRONIC HYPERTENSION  Disease Monitoring  Blood pressure range: 140-150s/90-110  Chest pain: no   Dyspnea: yes, during exercise    Claudication: no   Medication compliance: yes  Medication Side Effects  Lightheadedness: no   Urinary frequency: no   Edema: no   Preventitive Healthcare:  Exercise: yes   3. Obesity: started Qsymia on 06/02/14 x 2 weeks. Has a two week trial card. No chest pain or jitteriness.   Soc Hx: non smoker  Review of Systems As per HPI      Objective:   Physical Exam BP 160/88 mmHg  Pulse 50  Temp(Src) 98 F (36.7 C) (Oral)  Resp 18  Ht 5\' 5"  (1.651 m)  Wt 256 lb (116.121 kg)  BMI 42.60 kg/m2  SpO2 99%  LMP 06/29/2012 General appearance: alert, cooperative and no distress, morbid obesity  Lungs: clear to auscultation bilaterally Heart: regular rate and rhythm, S1, S2 normal, no murmur, click, rub or gallop Extremities: extremities normal, atraumatic, no cyanosis or edema     Assessment & Plan:

## 2014-06-19 NOTE — Assessment & Plan Note (Signed)
Shortness of breath with exertion: I agree that this is most likely related to carrying extra weight as you had a normal Myoview in 08/2013. Referral to cardiology placed.

## 2014-06-22 ENCOUNTER — Encounter: Payer: Self-pay | Admitting: Cardiology

## 2014-06-22 DIAGNOSIS — R918 Other nonspecific abnormal finding of lung field: Secondary | ICD-10-CM | POA: Insufficient documentation

## 2014-06-22 DIAGNOSIS — R9431 Abnormal electrocardiogram [ECG] [EKG]: Secondary | ICD-10-CM | POA: Insufficient documentation

## 2014-06-23 ENCOUNTER — Ambulatory Visit: Payer: 59 | Admitting: Cardiology

## 2014-07-10 ENCOUNTER — Encounter: Payer: Self-pay | Admitting: Cardiology

## 2014-08-16 ENCOUNTER — Telehealth: Payer: Self-pay | Admitting: *Deleted

## 2014-08-16 NOTE — Telephone Encounter (Signed)
Patient called in asking if Dr. Adrian Blackwater would refill her Qsymia.  She states that Dr. Adrian Blackwater prescribed it in the past.

## 2014-08-16 NOTE — Telephone Encounter (Signed)
Please inform patient I cannot prescribe Qsymia again due to lack of follow up.

## 2015-01-02 ENCOUNTER — Encounter: Payer: Self-pay | Admitting: Family Medicine

## 2015-01-02 ENCOUNTER — Other Ambulatory Visit (HOSPITAL_COMMUNITY)
Admission: RE | Admit: 2015-01-02 | Discharge: 2015-01-02 | Disposition: A | Discharge status: Home / Self Care | Payer: 59 | Source: Ambulatory Visit | Attending: Family Medicine | Admitting: Family Medicine

## 2015-01-02 ENCOUNTER — Ambulatory Visit: Payer: 59 | Attending: Family Medicine | Admitting: Family Medicine

## 2015-01-02 VITALS — BP 137/87 | HR 64 | Temp 99.0°F | Resp 20 | Ht 65.0 in | Wt 257.0 lb

## 2015-01-02 DIAGNOSIS — Z90710 Acquired absence of both cervix and uterus: Secondary | ICD-10-CM | POA: Diagnosis not present

## 2015-01-02 DIAGNOSIS — Z79899 Other long term (current) drug therapy: Secondary | ICD-10-CM | POA: Diagnosis not present

## 2015-01-02 DIAGNOSIS — N760 Acute vaginitis: Secondary | ICD-10-CM | POA: Insufficient documentation

## 2015-01-02 DIAGNOSIS — E119 Type 2 diabetes mellitus without complications: Secondary | ICD-10-CM | POA: Diagnosis not present

## 2015-01-02 DIAGNOSIS — N941 Unspecified dyspareunia: Secondary | ICD-10-CM | POA: Insufficient documentation

## 2015-01-02 DIAGNOSIS — Z9114 Patient's other noncompliance with medication regimen: Secondary | ICD-10-CM | POA: Diagnosis not present

## 2015-01-02 DIAGNOSIS — A499 Bacterial infection, unspecified: Secondary | ICD-10-CM

## 2015-01-02 DIAGNOSIS — E1169 Type 2 diabetes mellitus with other specified complication: Secondary | ICD-10-CM | POA: Diagnosis not present

## 2015-01-02 DIAGNOSIS — Z0000 Encounter for general adult medical examination without abnormal findings: Secondary | ICD-10-CM | POA: Insufficient documentation

## 2015-01-02 DIAGNOSIS — N898 Other specified noninflammatory disorders of vagina: Secondary | ICD-10-CM | POA: Insufficient documentation

## 2015-01-02 DIAGNOSIS — I10 Essential (primary) hypertension: Secondary | ICD-10-CM

## 2015-01-02 DIAGNOSIS — Z794 Long term (current) use of insulin: Secondary | ICD-10-CM | POA: Insufficient documentation

## 2015-01-02 DIAGNOSIS — B9689 Other specified bacterial agents as the cause of diseases classified elsewhere: Secondary | ICD-10-CM

## 2015-01-02 DIAGNOSIS — Z23 Encounter for immunization: Secondary | ICD-10-CM | POA: Diagnosis not present

## 2015-01-02 LAB — GLUCOSE, POCT (MANUAL RESULT ENTRY): POC GLUCOSE: 247 mg/dL — AB (ref 70–99)

## 2015-01-02 LAB — POCT GLYCOSYLATED HEMOGLOBIN (HGB A1C): HEMOGLOBIN A1C: 9.7

## 2015-01-02 MED ORDER — METFORMIN HCL ER (OSM) 1000 MG PO TB24: 1000.0000 mg | ORAL_TABLET | Freq: Two times a day (BID) | ORAL | Status: DC

## 2015-01-02 MED ORDER — AMLODIPINE BESYLATE 10 MG PO TABS: 10.0000 mg | ORAL_TABLET | Freq: Every day | ORAL | Status: DC

## 2015-01-02 NOTE — Progress Notes (Signed)
Patient here for [facility] Exam Patient denies pain at this time.

## 2015-01-02 NOTE — Progress Notes (Signed)
Subjective:  Patient ID: Julia Dunn, female    DOB: 1970/09/19  Age: 44 y.o. MRN: [id number]  CC: Gynecologic Exam  HPI Julia Dunn presents for   1. Gynecological exam: she was instructed by her insurance to have a wellness exam. She is s/p total hysterectomy in 2014. She reports pains with intercourse. She denies vaginal discharge or pelvic pain.   2. DM2: non compliant with insulin she feels toujeo is causing swelling in her feet. She takes 5 U here and there.    Past Surgical History  Procedure Laterality Date  . Tubal ligation    . Dilation and curettage of uterus    . Laparoscopy N/A 11/03/2012    Procedure: LAPAROSCOPY DIAGNOSTIC;  Surgeon: Ena Dawley, MD;  Location: [facility];  Service: Gynecology;  Laterality: N/A;  . Abdominal hysterectomy N/A 11/03/2012    Procedure: HYSTERECTOMY ABDOMINAL;  Surgeon: Ena Dawley, MD;  Location: [facility];  Service: Gynecology;  Laterality: N/A;  . Bilateral salpingectomy Bilateral 11/03/2012    Procedure: BILATERAL SALPINGECTOMY;  Surgeon: Ena Dawley, MD;  Location: [facility];  Service: Gynecology;  Laterality: Bilateral;  . Burch procedure N/A 11/03/2012    Procedure: BURCH PROCEDURE;  Surgeon: Ena Dawley, MD;  Location: [facility];  Service: Gynecology;  Laterality: N/A;  . Cystoscopy N/A 11/03/2012    Procedure: CYSTOSCOPY;  Surgeon: Ena Dawley, MD;  Location: [facility];  Service: Gynecology;  Laterality: N/A;   Social History  Substance Use Topics  . Smoking status: Never Smoker   . Smokeless tobacco: Never Used  . Alcohol Use: Yes     Comment: SOCIAL   Outpatient Prescriptions Prior to Visit  Medication Sig Dispense Refill  . amLODipine (NORVASC) 10 MG tablet Take 1 tablet (10 mg total) by mouth daily. 30 tablet 11  . cholecalciferol (VITAMIN D) 1000 UNITS tablet Take 1 tablet (1,000 Units total) by mouth daily. (Patient not taking: Reported on 06/19/2014) 90 tablet 3  . FLUoxetine (PROZAC) 20 MG capsule Take 1 capsule (20 mg  total) by mouth daily. 30 capsule 5  . glucose blood test strip 1 each by Other route daily before breakfast. E11.9 100 each 12  . Insulin Glargine (TOUJEO SOLOSTAR) 300 UNIT/ML SOPN Inject 10 Units into the skin at bedtime. 1.5 mL 11  . Insulin Pen Needle (B-D ULTRAFINE III SHORT PEN) 31G X 8 MM MISC 1 application by Does not apply route daily. 30 each 11  . Lancets (ONETOUCH ULTRASOFT) lancets 1 each by Other route 3 (three) times daily. E11.9 100 each 12  . lisinopril-hydrochlorothiazide (PRINZIDE,ZESTORETIC) 20-12.5 MG per tablet Take 2 tablets by mouth daily. 60 tablet 11  . metFORMIN (GLUCOPHAGE XR) 500 MG 24 hr tablet Take 1 tablet (500 mg total) by mouth daily after supper. 30 tablet 11   No facility-administered medications prior to visit.    ROS Review of Systems  Constitutional: Negative for fever and chills.  Eyes: Negative for visual disturbance.  Respiratory: Negative for shortness of breath.   Cardiovascular: Negative for chest pain.  Gastrointestinal: Negative for abdominal pain and blood in stool.  Musculoskeletal: Positive for back pain. Negative for arthralgias.  Skin: Negative for rash.  Allergic/Immunologic: Negative for immunocompromised state.  Hematological: Negative for adenopathy. Does not bruise/bleed easily.  Psychiatric/Behavioral: Negative for suicidal ideas and dysphoric mood.    Objective:  BP 137/87 mmHg  Pulse 64  Temp(Src) 99 F (37.2 C) (Oral)  Resp 20  Ht 5\' 5"  (1.651 m)  Wt 257 lb (116.574  kg)  BMI 42.77 kg/m2  SpO2 98%  LMP 06/29/2012  BP/Weight 01/02/2015 0/22/3361 05/03/4495  Systolic BP 530 051 102  Diastolic BP 87 88 111  Wt. (Lbs) 257 256 263  BMI 42.77 42.6 43.77    Physical Exam  Constitutional: She is oriented to person, place, and time. She appears well-developed and well-nourished. No distress.  HENT:  Head: Normocephalic and atraumatic.  Cardiovascular: Normal rate, regular rhythm, normal heart sounds and intact distal  pulses.   Pulmonary/Chest: Effort normal and breath sounds normal.  Musculoskeletal: She exhibits no edema.  Neurological: She is alert and oriented to person, place, and time.  Skin: Skin is warm and dry. No rash noted.  Psychiatric: She has a normal mood and affect.   Lab Results  Component Value Date   HGBA1C 9.70 01/02/2015   CBG 247   Assessment & Plan:   Problem List Items Addressed This Visit    DM2 (diabetes mellitus, type 2) - Primary (Chronic)   Relevant Medications   metformin (FORTAMET) 1000 MG (OSM) 24 hr tablet   Other Relevant Orders   Glucose (CBG) (Completed)   HgB A1c (Completed)   Microalbumin/Creatinine Ratio, Urine   HTN (hypertension) (Chronic)   Relevant Medications   amLODipine (NORVASC) 10 MG tablet    Other Visit Diagnoses    Vaginal discharge        Relevant Orders    Cervicovaginal ancillary only       No orders of the defined types were placed in this encounter.    Follow-up: No Follow-up on file.   Boykin Nearing MD

## 2015-01-02 NOTE — Patient Instructions (Signed)
Julia Dunn was seen today for gynecologic exam.  Diagnoses and all orders for this visit:  Type 2 diabetes mellitus with other specified complication ([place]) -     Glucose (CBG) -     HgB A1c -     Microalbumin/Creatinine Ratio, Urine -     metformin (FORTAMET) 1000 MG (OSM) 24 hr tablet; Take 1 tablet (1,000 mg total) by mouth 2 (two) times daily.  Vaginal discharge -     Cervicovaginal ancillary only  Essential hypertension -     amLODipine (NORVASC) 10 MG tablet; Take 1 tablet (10 mg total) by mouth daily.   Normal pelvic exam  F/u in 4-6 weeks for diabetes f/u   Dr. [facility] Blackwater

## 2015-01-03 LAB — MICROALBUMIN / CREATININE URINE RATIO: Creatinine, Urine: 137.9 mg/dL

## 2015-01-04 DIAGNOSIS — B9689 Other specified bacterial agents as the cause of diseases classified elsewhere: Secondary | ICD-10-CM | POA: Insufficient documentation

## 2015-01-04 DIAGNOSIS — N760 Acute vaginitis: Secondary | ICD-10-CM

## 2015-01-04 LAB — CERVICOVAGINAL ANCILLARY ONLY: WET PREP (BD AFFIRM): POSITIVE — AB

## 2015-01-04 MED ORDER — METRONIDAZOLE 500 MG PO TABS: 500.0000 mg | ORAL_TABLET | Freq: Two times a day (BID) | ORAL | Status: AC

## 2015-01-04 MED ORDER — FLUCONAZOLE 150 MG PO TABS: 150.0000 mg | ORAL_TABLET | Freq: Once | ORAL | Status: DC

## 2015-01-04 NOTE — Addendum Note (Signed)
Addended by: Boykin Nearing on: 01/04/2015 09:39 AM   Modules accepted: Orders, SmartSet

## 2015-01-24 ENCOUNTER — Telehealth: Payer: Self-pay | Admitting: *Deleted

## 2015-01-24 NOTE — Telephone Encounter (Signed)
LVM to return call.

## 2015-01-24 NOTE — Telephone Encounter (Signed)
-----   Message from Boykin Nearing, MD sent at 01/04/2015  9:37 AM EDT ----- BV on wet prep Sent in flagyl followed by diflucan

## 2015-01-24 NOTE — Telephone Encounter (Signed)
-----   Message from Boykin Nearing, MD sent at 01/03/2015  9:12 AM EDT ----- Urine microalbumin normal

## 2015-04-19 MED FILL — AMLODIPINE BESYLATE 10 MG T: 10 | 30 days supply | Qty: 30 | Fill #5

## 2015-04-19 MED FILL — LISINOPRIL-HCTZ 20-12.5 MG: 20-12.5 | 30 days supply | Qty: 60 | Fill #5

## 2015-05-28 MED FILL — METFORMIN HCL ER 500 MG TAB: 500 | 30 days supply | Qty: 120 | Fill #1

## 2015-06-11 MED FILL — AMLODIPINE BESYLATE 10 MG T: 10 | 30 days supply | Qty: 30 | Fill #6

## 2015-06-11 MED FILL — LISINOPRIL-HCTZ 20-12.5 MG: 20-12.5 | 30 days supply | Qty: 60 | Fill #6

## 2015-07-27 ENCOUNTER — Other Ambulatory Visit: Payer: Self-pay | Admitting: Family Medicine

## 2015-07-27 MED FILL — AMLODIPINE BESYLATE 10 MG T: 10 | 30 days supply | Qty: 30 | Fill #0

## 2015-07-27 MED FILL — LISINOPRIL-HCTZ 20-12.5 MG: 20-12.5 | 30 days supply | Qty: 60 | Fill #0

## 2015-08-28 ENCOUNTER — Other Ambulatory Visit: Payer: Self-pay | Admitting: Family Medicine

## 2015-08-29 ENCOUNTER — Other Ambulatory Visit: Payer: Self-pay | Admitting: Family Medicine

## 2015-08-29 ENCOUNTER — Other Ambulatory Visit: Payer: Self-pay | Admitting: *Deleted

## 2015-08-29 MED ORDER — GLUCOSE BLOOD VI STRP: ORAL_STRIP | Status: DC

## 2015-08-31 ENCOUNTER — Ambulatory Visit: Payer: 59 | Attending: Family Medicine | Admitting: Family Medicine

## 2015-08-31 ENCOUNTER — Encounter: Payer: Self-pay | Admitting: Family Medicine

## 2015-08-31 VITALS — BP 113/77 | HR 56 | Temp 97.8°F | Resp 16 | Ht 65.0 in | Wt 237.0 lb

## 2015-08-31 DIAGNOSIS — R0602 Shortness of breath: Secondary | ICD-10-CM | POA: Diagnosis not present

## 2015-08-31 DIAGNOSIS — Z6839 Body mass index (BMI) 39.0-39.9, adult: Secondary | ICD-10-CM | POA: Diagnosis not present

## 2015-08-31 DIAGNOSIS — E119 Type 2 diabetes mellitus without complications: Secondary | ICD-10-CM | POA: Insufficient documentation

## 2015-08-31 DIAGNOSIS — R140 Abdominal distension (gaseous): Secondary | ICD-10-CM | POA: Diagnosis not present

## 2015-08-31 DIAGNOSIS — F1099 Alcohol use, unspecified with unspecified alcohol-induced disorder: Secondary | ICD-10-CM | POA: Diagnosis not present

## 2015-08-31 DIAGNOSIS — Z79899 Other long term (current) drug therapy: Secondary | ICD-10-CM | POA: Diagnosis not present

## 2015-08-31 DIAGNOSIS — I10 Essential (primary) hypertension: Secondary | ICD-10-CM | POA: Diagnosis not present

## 2015-08-31 DIAGNOSIS — E668 Other obesity: Secondary | ICD-10-CM | POA: Insufficient documentation

## 2015-08-31 DIAGNOSIS — Z7984 Long term (current) use of oral hypoglycemic drugs: Secondary | ICD-10-CM | POA: Diagnosis not present

## 2015-08-31 DIAGNOSIS — Z794 Long term (current) use of insulin: Secondary | ICD-10-CM | POA: Diagnosis not present

## 2015-08-31 DIAGNOSIS — E1169 Type 2 diabetes mellitus with other specified complication: Secondary | ICD-10-CM

## 2015-08-31 LAB — GLUCOSE, POCT (MANUAL RESULT ENTRY): POC Glucose: 165 mg/dl — AB (ref 70–99)

## 2015-08-31 LAB — BASIC METABOLIC PANEL WITH GFR
BUN: 12 mg/dL (ref 7–25)
CHLORIDE: 100 mmol/L (ref 98–110)
CO2: 30 mmol/L (ref 20–31)
CREATININE: 0.93 mg/dL (ref 0.50–1.10)
Calcium: 9.2 mg/dL (ref 8.6–10.2)
GFR, Est African American: 86 mL/min (ref >=60)
GFR, Est Non African American: 75 mL/min (ref >=60)
GLUCOSE: 168 mg/dL — AB (ref 65–99)
POTASSIUM: 4 mmol/L (ref 3.5–5.3)
Sodium: 140 mmol/L (ref 135–146)

## 2015-08-31 LAB — POCT GLYCOSYLATED HEMOGLOBIN (HGB A1C): Hemoglobin A1C: 7.9

## 2015-08-31 LAB — LIPID PANEL
CHOL/HDL RATIO: 2.9 ratio (ref <=5.0)
Cholesterol: 141 mg/dL (ref 125–200)
HDL: 49 mg/dL (ref >=46)
LDL CALC: 69 mg/dL (ref <130)
Triglycerides: 117 mg/dL (ref <150)
VLDL: 23 mg/dL (ref <30)

## 2015-08-31 LAB — CBC
HEMATOCRIT: 42.9 % (ref 35.0–45.0)
HEMOGLOBIN: 14.3 g/dL (ref 11.7–15.5)
MCH: 29.8 pg (ref 27.0–33.0)
MCHC: 33.3 g/dL (ref 32.0–36.0)
MCV: 89.4 fL (ref 80.0–100.0)
MPV: 9.7 fL (ref 7.5–12.5)
PLATELETS: 216 10*3/uL (ref 140–400)
RBC: 4.8 MIL/uL (ref 3.80–5.10)
RDW: 13.6 % (ref 11.0–15.0)
WBC: 5.6 10*3/uL (ref 3.8–10.8)

## 2015-08-31 MED ORDER — LISINOPRIL-HYDROCHLOROTHIAZIDE 20-12.5 MG PO TABS: 2.0000 | ORAL_TABLET | Freq: Every day | ORAL | Status: DC

## 2015-08-31 MED ORDER — FUROSEMIDE 20 MG PO TABS: 20.0000 mg | ORAL_TABLET | Freq: Every day | ORAL | Status: DC

## 2015-08-31 MED ORDER — AMLODIPINE BESYLATE 5 MG PO TABS: 5.0000 mg | ORAL_TABLET | Freq: Every day | ORAL | Status: DC

## 2015-08-31 MED ORDER — METFORMIN HCL ER (OSM) 1000 MG PO TB24: 1000.0000 mg | ORAL_TABLET | Freq: Two times a day (BID) | ORAL | Status: DC

## 2015-08-31 MED ORDER — ALBUTEROL SULFATE HFA 108 (90 BASE) MCG/ACT IN AERS: 2.0000 | INHALATION_SPRAY | Freq: Four times a day (QID) | RESPIRATORY_TRACT | Status: DC | PRN

## 2015-08-31 NOTE — Assessment & Plan Note (Signed)
Intermittent SOB. Normal exam. Hx of asthma as child.  Albuterol prn CBC

## 2015-08-31 NOTE — Progress Notes (Signed)
Subjective:  Patient ID: Julia Dunn, female    DOB: 19-Oct-1970  Age: 45 y.o. MRN: [id number]  CC: Diabetes and Hypertension   HPI Julia Dunn presents for    1. CHRONIC DIABETES  Disease Monitoring  Blood Sugar Ranges: 116-200   Polyuria: no   Visual problems: no   Medication Compliance: takes 5-10 U or toujeo. Feels toujeo is causing swelling. Taking 500 mg of metformin BID   Medication Side Effects  Hypoglycemia: no   Preventitive Health Care  Eye Exam: due   Foot Exam: done today   Diet pattern: eating 5 small meals a day   Exercise: daily, working with a trainer    2. CHRONIC HYPERTENSION  Disease Monitoring  Blood pressure range: not checking   Chest pain: no   Dyspnea: yes with exercise   Claudication: no   Medication compliance: yes  Medication Side Effects  Lightheadedness: no   Urinary frequency: no   Edema: no   Impotence: no   Preventitive Healthcare:  Exercise: yes    3. Abdominal bloating: after eating carbs. No pain. S/p hysterectomy. She took 1/2 of her mother's bumex which helped with bloated feeling.    Social History  Substance Use Topics  . Smoking status: Never Smoker   . Smokeless tobacco: Never Used  . Alcohol Use: Yes     Comment: SOCIAL    Outpatient Prescriptions Prior to Visit  Medication Sig Dispense Refill  . amLODipine (NORVASC) 10 MG tablet Take 1 tablet (10 mg total) by mouth daily. Must have office visit for refills 30 tablet 0  . cholecalciferol (VITAMIN D) 1000 UNITS tablet Take 1 tablet (1,000 Units total) by mouth daily. (Patient not taking: Reported on 06/19/2014) 90 tablet 3  . fluconazole (DIFLUCAN) 150 MG tablet Take 1 tablet (150 mg total) by mouth once. 1 tablet 0  . Insulin Pen Needle (B-D ULTRAFINE III SHORT PEN) 31G X 8 MM MISC 1 application by Does not apply route daily. 30 each 11  . Lancets (ONETOUCH ULTRASOFT) lancets 1 each by Other route 3 (three) times daily. E11.9 100 each 12  .  lisinopril-hydrochlorothiazide (PRINZIDE,ZESTORETIC) 20-12.5 MG tablet Take 2 tablets by mouth daily. Must have office visit for refills 60 tablet 0  . metformin (FORTAMET) 1000 MG (OSM) 24 hr tablet Take 1 tablet (1,000 mg total) by mouth 2 (two) times daily. 60 tablet 5  . ONE TOUCH ULTRA TEST test strip USE TO TEST DAILY BEFORE BREAKFAST 100 each 12   No facility-administered medications prior to visit.    ROS Review of Systems  Constitutional: Negative for fever and chills.  Eyes: Negative for visual disturbance.  Respiratory: Positive for shortness of breath.   Cardiovascular: Negative for chest pain.  Gastrointestinal: Negative for abdominal pain and blood in stool.  Musculoskeletal: Negative for back pain and arthralgias.  Skin: Negative for rash.  Allergic/Immunologic: Negative for immunocompromised state.  Hematological: Negative for adenopathy. Does not bruise/bleed easily.  Psychiatric/Behavioral: Negative for suicidal ideas and dysphoric mood.    Objective:  BP 113/77 mmHg  Pulse 56  Temp(Src) 97.8 F (36.6 C) (Oral)  Resp 16  Ht 5\' 5"  (1.651 m)  Wt 237 lb (107.502 kg)  BMI 39.44 kg/m2  SpO2 97%  LMP 06/29/2012  BP/Weight 08/31/2015 01/02/2015 [id number]  Systolic BP 123456 0000000 0000000  Diastolic BP 77 87 88  Wt. (Lbs) 237 257 256  BMI 39.44 42.77 42.6   Physical Exam  Constitutional: She is oriented to person, place,  and time. She appears well-developed and well-nourished. No distress.  Obese   HENT:  Head: Normocephalic and atraumatic.  Cardiovascular: Normal rate, regular rhythm, normal heart sounds and intact distal pulses.   Pulmonary/Chest: Effort normal and breath sounds normal.  Musculoskeletal: She exhibits no edema.  Neurological: She is alert and oriented to person, place, and time.  Skin: Skin is warm and dry. No rash noted.  Psychiatric: She has a normal mood and affect.   Lab Results  Component Value Date   HGBA1C 9.70 01/02/2015   CBG 165  Lab  Results  Component Value Date   HGBA1C 7.9 08/31/2015    Assessment & Plan:  [name] was seen today for diabetes and hypertension.  Diagnoses and all orders for this visit:  Type 2 diabetes mellitus with other specified complication ([place]) -     POCT glucose (manual entry) -     POCT glycosylated hemoglobin (Hb A1C) -     metformin (FORTAMET) 1000 MG (OSM) 24 hr tablet; Take 1 tablet (1,000 mg total) by mouth 2 (two) times daily. -     Lipid Panel  Essential hypertension -     amLODipine (NORVASC) 5 MG tablet; Take 1 tablet (5 mg total) by mouth daily. -     lisinopril-hydrochlorothiazide (PRINZIDE,ZESTORETIC) 20-12.5 MG tablet; Take 2 tablets by mouth daily. -     furosemide (LASIX) 20 MG tablet; Take 1 tablet (20 mg total) by mouth daily. As needed for swelling -     BASIC METABOLIC PANEL WITH GFR  SOB (shortness of breath) on exertion -     CBC -     albuterol (PROVENTIL HFA;VENTOLIN HFA) 108 (90 Base) MCG/ACT inhaler; Inhale 2 puffs into the lungs every 6 (six) hours as needed for wheezing or shortness of breath.    No orders of the defined types were placed in this encounter.    Follow-up: No Follow-up on file.   Boykin Nearing MD

## 2015-08-31 NOTE — Assessment & Plan Note (Signed)
Well controlled Intermittent swelling  Decrease norvasc from 10 to 5 mg with plan to taper to off Continue prinzide Add lasix 20 mg prn swelling, increase potassium in food  Encouraged continued exercise and low salt diet

## 2015-08-31 NOTE — Patient Instructions (Addendum)
Julia Dunn was seen today for diabetes and hypertension.  Diagnoses and all orders for this visit:  Type 2 diabetes mellitus with other specified complication ([place]) -     POCT glucose (manual entry) -     POCT glycosylated hemoglobin (Hb A1C) -     metformin (FORTAMET) 1000 MG (OSM) 24 hr tablet; Take 1 tablet (1,000 mg total) by mouth 2 (two) times daily. -     Lipid Panel  Essential hypertension -     amLODipine (NORVASC) 5 MG tablet; Take 1 tablet (5 mg total) by mouth daily. -     lisinopril-hydrochlorothiazide (PRINZIDE,ZESTORETIC) 20-12.5 MG tablet; Take 2 tablets by mouth daily. -     furosemide (LASIX) 20 MG tablet; Take 1 tablet (20 mg total) by mouth daily. As needed for swelling -     BASIC METABOLIC PANEL WITH GFR  SOB (shortness of breath) on exertion -     CBC -     albuterol (PROVENTIL HFA;VENTOLIN HFA) 108 (90 Base) MCG/ACT inhaler; Inhale 2 puffs into the lungs every 6 (six) hours as needed for wheezing or shortness of breath.   F/u in for pelvic exam in about 4 weeks   Dr. [facility] Blackwater   Potassium Content of Foods Potassium is a mineral found in many foods and drinks. It helps keep fluids and minerals balanced in your body and affects how steadily your heart beats. Potassium also helps control your blood pressure and keep your muscles and nervous system healthy. Certain health conditions and medicines may change the balance of potassium in your body. When this happens, you can help balance your level of potassium through the foods that you do or do not eat. Your health care provider or dietitian may recommend an amount of potassium that you should have each day. The following lists of foods provide the amount of potassium (in parentheses) per serving in each item. HIGH IN POTASSIUM  The following foods and beverages have 200 mg or more of potassium per serving:  Apricots, 2 raw or 5 dry (200 mg).  Artichoke, 1 medium (345 mg).  Avocado, raw,  each (245 mg).  Banana, 1  medium (425 mg).  Beans, lima, or baked beans, canned,  cup (280 mg).  Beans, white, canned,  cup (595 mg).  Beef roast, 3 oz (320 mg).  Beef, ground, 3 oz (270 mg).  Beets, raw or cooked,  cup (260 mg).  Bran muffin, 2 oz (300 mg).  Broccoli,  cup (230 mg).  Brussels sprouts,  cup (250 mg).  Cantaloupe,  cup (215 mg).  Cereal, 100% bran,  cup (200-400 mg).  Cheeseburger, single, fast food, 1 each (225-400 mg).  Chicken, 3 oz (220 mg).  Clams, canned, 3 oz (535 mg).  Crab, 3 oz (225 mg).  Dates, 5 each (270 mg).  Dried beans and peas,  cup (300-475 mg).  Figs, dried, 2 each (260 mg).  Fish: halibut, tuna, cod, snapper, 3 oz (480 mg).  Fish: salmon, haddock, swordfish, perch, 3 oz (300 mg).  Fish, tuna, canned 3 oz (200 mg).  [place] fries, fast food, 3 oz (470 mg).  Granola with fruit and nuts,  cup (200 mg).  Grapefruit juice,  cup (200 mg).  Greens, beet,  cup (655 mg).  Honeydew melon,  cup (200 mg).  Kale, raw, 1 cup (300 mg).  Kiwi, 1 medium (240 mg).  Kohlrabi, rutabaga, parsnips,  cup (280 mg).  Lentils,  cup (365 mg).  Mango, 1 each (325 mg).  Milk, chocolate, 1 cup (420 mg).  Milk: nonfat, low-fat, whole, buttermilk, 1 cup (350-380 mg).  Molasses, 1 Tbsp (295 mg).  Mushrooms,  cup (280) mg.  Nectarine, 1 each (275 mg).  Nuts: almonds, peanuts, hazelnuts, [place], cashew, mixed, 1 oz (200 mg).  Nuts, pistachios, 1 oz (295 mg).  Orange, 1 each (240 mg).  Orange juice,  cup (235 mg).  Papaya, medium,  fruit (390 mg).  Peanut butter, chunky, 2 Tbsp (240 mg).  Peanut butter, smooth, 2 Tbsp (210 mg).  Pear, 1 medium (200 mg).  Pomegranate, 1 whole (400 mg).  Pomegranate juice,  cup (215 mg).  Pork, 3 oz (350 mg).  Potato chips, salted, 1 oz (465 mg).  Potato, baked with skin, 1 medium (925 mg).  Potatoes, boiled,  cup (255 mg).  Potatoes, mashed,  cup (330 mg).  Prune juice,  cup (370  mg).  Prunes, 5 each (305 mg).  Pudding, chocolate,  cup (230 mg).  Pumpkin, canned,  cup (250 mg).  Raisins, seedless,  cup (270 mg).  Seeds, sunflower or pumpkin, 1 oz (240 mg).  Soy milk, 1 cup (300 mg).  Spinach,  cup (420 mg).  Spinach, canned,  cup (370 mg).  Sweet potato, baked with skin, 1 medium (450 mg).  Swiss chard,  cup (480 mg).  Tomato or vegetable juice,  cup (275 mg).  Tomato sauce or puree,  cup (400-550 mg).  Tomato, raw, 1 medium (290 mg).  Tomatoes, canned,  cup (200-300 mg).  [place], 3 oz (250 mg).  Wheat germ, 1 oz (250 mg).  Winter squash,  cup (250 mg).  Yogurt, plain or fruited, 6 oz (260-435 mg).  Zucchini,  cup (220 mg). MODERATE IN POTASSIUM The following foods and beverages have 50-200 mg of potassium per serving:  Apple, 1 each (150 mg).  Apple juice,  cup (150 mg).  Applesauce,  cup (90 mg).  Apricot nectar,  cup (140 mg).  Asparagus, small spears,  cup or 6 spears (155 mg).  Bagel, cinnamon raisin, 1 each (130 mg).  Bagel, egg or plain, 4 in., 1 each (70 mg).  Beans, green,  cup (90 mg).  Beans, yellow,  cup (190 mg).  Beer, regular, 12 oz (100 mg).  Beets, canned,  cup (125 mg).  Blackberries,  cup (115 mg).  Blueberries,  cup (60 mg).  Bread, whole wheat, 1 slice (70 mg).  Broccoli, raw,  cup (145 mg).  Cabbage,  cup (150 mg).  Carrots, cooked or raw,  cup (180 mg).  Cauliflower, raw,  cup (150 mg).  Celery, raw,  cup (155 mg).  Cereal, bran flakes, cup (120-150 mg).  Cheese, cottage,  cup (110 mg).  Cherries, 10 each (150 mg).  Chocolate, 1 oz bar (165 mg).  Coffee, brewed 6 oz (90 mg).  Corn,  cup or 1 ear (195 mg).  Cucumbers,  cup (80 mg).  Egg, large, 1 each (60 mg).  Eggplant,  cup (60 mg).  Endive, raw, cup (80 mg).  English muffin, 1 each (65 mg).  Fish, orange roughy, 3 oz (150 mg).  Frankfurter, beef or pork, 1 each (75 mg).  Fruit  cocktail,  cup (115 mg).  Grape juice,  cup (170 mg).  Grapefruit,  fruit (175 mg).  Grapes,  cup (155 mg).  Greens: kale, turnip, collard,  cup (110-150 mg).  Ice cream or frozen yogurt, chocolate,  cup (175 mg).  Ice cream or frozen yogurt, vanilla,  cup (120-150 mg).  Tina Griffiths,  limes, 1 each (80 mg).  Lettuce, all types, 1 cup (100 mg).  Mixed vegetables,  cup (150 mg).  Mushrooms, raw,  cup (110 mg).  Nuts: walnuts, pecans, or macadamia, 1 oz (125 mg).  Oatmeal,  cup (80 mg).  Okra,  cup (110 mg).  Onions, raw,  cup (120 mg).  Peach, 1 each (185 mg).  Peaches, canned,  cup (120 mg).  Pears, canned,  cup (120 mg).  Peas, green, frozen,  cup (90 mg).  Peppers, green,  cup (130 mg).  Peppers, red,  cup (160 mg).  Pineapple juice,  cup (165 mg).  Pineapple, fresh or canned,  cup (100 mg).  Plums, 1 each (105 mg).  Pudding, vanilla,  cup (150 mg).  Raspberries,  cup (90 mg).  Rhubarb,  cup (115 mg).  Rice, wild,  cup (80 mg).  Shrimp, 3 oz (155 mg).  Spinach, raw, 1 cup (170 mg).  Strawberries,  cup (125 mg).  Summer squash  cup (175-200 mg).  Swiss chard, raw, 1 cup (135 mg).  Tangerines, 1 each (140 mg).  Tea, brewed, 6 oz (65 mg).  Turnips,  cup (140 mg).  Watermelon,  cup (85 mg).  Wine, red, table, 5 oz (180 mg).  Wine, white, table, 5 oz (100 mg). LOW IN POTASSIUM The following foods and beverages have less than 50 mg of potassium per serving.  Bread, white, 1 slice (30 mg).  Carbonated beverages, 12 oz (less than 5 mg).  Cheese, 1 oz (20-30 mg).  Cranberries,  cup (45 mg).  Cranberry juice cocktail,  cup (20 mg).  Fats and oils, 1 Tbsp (less than 5 mg).  Hummus, 1 Tbsp (32 mg).  Nectar: papaya, mango, or pear,  cup (35 mg).  Rice, white or brown,  cup (50 mg).  Spaghetti or macaroni,  cup cooked (30 mg).  Tortilla, flour or corn, 1 each (50 mg).  Waffle, 4 in., 1 each (50  mg).  Water chestnuts,  cup (40 mg).   This information is not intended to replace advice given to you by your health care provider. Make sure you discuss any questions you have with your health care provider.   Document Released: 10/29/2004 Document Revised: 03/22/2013 Document Reviewed: 02/11/2013 Elsevier Interactive Patient Education [employer].

## 2015-08-31 NOTE — Progress Notes (Signed)
F/U DM  Taking medication as prescribed  Glucose running 116-200 No pain today. Pt stated not in pain since took oxycodone form her mom No suicidal thoughts in the past two weeks

## 2015-08-31 NOTE — Assessment & Plan Note (Signed)
Improved with weight loss  D./c toujeo  Max metformin at 1000 mg BID Continue exercise and low carb diet

## 2015-09-05 ENCOUNTER — Telehealth: Payer: Self-pay | Admitting: *Deleted

## 2015-09-05 NOTE — Telephone Encounter (Signed)
-----   Message from Boykin Nearing, MD sent at 09/03/2015 12:59 PM EDT ----- All labs normal

## 2015-09-05 NOTE — Telephone Encounter (Signed)
Date of birth verified by pt  Labs normal  Pt verbalized understanding

## 2015-10-13 ENCOUNTER — Other Ambulatory Visit: Payer: Self-pay | Admitting: Family Medicine

## 2015-11-20 ENCOUNTER — Telehealth: Payer: Self-pay | Admitting: Family Medicine

## 2015-11-20 NOTE — Telephone Encounter (Signed)
Pt called the office to speak with nurse regarding her medication. Pt stated that [facility] has been faxing [place] an authorization for her metformin (FORTAMET) 1000 MG (OSM) 24 hr tablet and they haven't had any response. Pt is asking that if it will not be authorized to please call a different rx in. ([facility] [address])

## 2015-11-20 NOTE — Telephone Encounter (Signed)
Pt called the office to speak with nurse regarding her medication.

## 2015-11-21 ENCOUNTER — Other Ambulatory Visit: Payer: Self-pay | Admitting: Pharmacist

## 2015-11-21 MED ORDER — METFORMIN HCL 1000 MG PO TABS: 1000.0000 mg | ORAL_TABLET | Freq: Two times a day (BID) | ORAL | 5 refills | Status: DC

## 2015-11-21 NOTE — Telephone Encounter (Signed)
Patient was informed of metformin being sent to [facility].

## 2015-12-29 ENCOUNTER — Emergency Department (HOSPITAL_COMMUNITY): Payer: 59

## 2015-12-29 ENCOUNTER — Encounter (HOSPITAL_COMMUNITY): Payer: Self-pay | Admitting: Emergency Medicine

## 2015-12-29 ENCOUNTER — Emergency Department (HOSPITAL_COMMUNITY)
Admission: EM | Admit: 2015-12-29 | Discharge: 2015-12-29 | Disposition: A | Discharge status: Home / Self Care | Payer: 59 | Attending: Emergency Medicine | Admitting: Emergency Medicine

## 2015-12-29 DIAGNOSIS — E119 Type 2 diabetes mellitus without complications: Secondary | ICD-10-CM | POA: Diagnosis not present

## 2015-12-29 DIAGNOSIS — Y929 Unspecified place or not applicable: Secondary | ICD-10-CM | POA: Diagnosis not present

## 2015-12-29 DIAGNOSIS — Z794 Long term (current) use of insulin: Secondary | ICD-10-CM | POA: Diagnosis not present

## 2015-12-29 DIAGNOSIS — Z7984 Long term (current) use of oral hypoglycemic drugs: Secondary | ICD-10-CM | POA: Diagnosis not present

## 2015-12-29 DIAGNOSIS — I10 Essential (primary) hypertension: Secondary | ICD-10-CM | POA: Diagnosis not present

## 2015-12-29 DIAGNOSIS — Y9341 Activity, dancing: Secondary | ICD-10-CM | POA: Insufficient documentation

## 2015-12-29 DIAGNOSIS — X509XXA Other and unspecified overexertion or strenuous movements or postures, initial encounter: Secondary | ICD-10-CM | POA: Diagnosis not present

## 2015-12-29 DIAGNOSIS — Y999 Unspecified external cause status: Secondary | ICD-10-CM | POA: Insufficient documentation

## 2015-12-29 DIAGNOSIS — J45909 Unspecified asthma, uncomplicated: Secondary | ICD-10-CM | POA: Insufficient documentation

## 2015-12-29 DIAGNOSIS — S82842A Displaced bimalleolar fracture of left lower leg, initial encounter for closed fracture: Secondary | ICD-10-CM | POA: Insufficient documentation

## 2015-12-29 DIAGNOSIS — S99912A Unspecified injury of left ankle, initial encounter: Secondary | ICD-10-CM | POA: Diagnosis present

## 2015-12-29 MED ORDER — HYDROMORPHONE HCL 1 MG/ML IJ SOLN
1.0000 mg | Freq: Once | INTRAMUSCULAR | Status: AC
  Administered 2015-12-29: 1 mg via INTRAMUSCULAR
  Filled 2015-12-29: qty 1

## 2015-12-29 MED ORDER — OXYCODONE-ACETAMINOPHEN 5-325 MG PO TABS: 1.0000 | ORAL_TABLET | ORAL | 0 refills | Status: DC | PRN

## 2015-12-29 NOTE — Discharge Instructions (Signed)
You have been evaluated for your left ankle injury.  You will likely need surgery to repair your ankle.  Please call Dr. Jess Barters office on Monday for close follow up and further management.  Keep ankle elevated when rest.  Do not bear any weight on the affected ankle.

## 2015-12-29 NOTE — ED Provider Notes (Signed)
[facility] DEPT Provider Note   CSN: [id number] Arrival date & time: 12/29/15  2125  By signing my name below, I, Julia Dunn, attest that this documentation has been prepared under the direction and in the presence of Domenic Moras, PA-C.  Electronically Signed: Reola Dunn, ED Scribe. 12/29/15. 9:50 PM.  History   Chief Complaint Chief Complaint  Patient presents with  . Ankle Pain   The history is provided by the patient. No language interpreter was used.   HPI Comments: Julia Dunn is a 45 y.o. female with a PMHx of DM, who presents to the Emergency Department complaining of sudden onset, gradually worsening left ankle pain onset just PTA. Pt reports that she was dancing in her yard, when she turned suddenly and placed her left foot behind her causing her to invert it, sustaining her pain. Pt reports that upon inversion, she heard a sudden "pop" and there has been mild edema since. No LOC or head injury during the fall. Pt took a dose of 5mg  Hydrocodone ~4 hours ago and elevated her foot prior to coming into the ED with mild relief of her pain, but notes that her pain is currently worsening. Her pain is exacerbated with movement of her left ankle and attempted weight bearing. Pt has been minimally ambulatory since the incident; but with moderate difficulty secondary to pain. She denies numbness, weakness, or any other associated symptoms.   Past Medical History:  Diagnosis Date  . Anemia Dx 2013  . Asthma   . Bacterial vaginosis 2008  . Depression   . Depression   . Diabetes mellitus Dx 2005  . Fibroids 09/05/2011   08/08/2011: Anteverted uterus. Diffuse fibroid involvement. Left of midline fibroid with submucosal component. 3.4cm x 2.8cmx 3.4cm.   Marland Kitchen Gestational diabetes   . Headache(784.0)    migraines  . History of blood transfusion 2013/2014  . Hx of blood clots   . Hypertension Dx 2015  . Kidney infection 2015  . Menorrhagia 09/27/2010  . Postpartum  depression   . Pregnancy induced hypertension   . Trichomonas    Patient Active Problem List   Diagnosis Date Noted  . SOB (shortness of breath) on exertion 08/31/2015  . Abnormal EKG 06/22/2014  . Abnormal CT scan of lung 06/22/2014  . Dyspnea 06/19/2014  . Microscopic hematuria 04/07/2014  . Onychomycosis 05/31/2013  . BI-RADS category 3 mammogram result 03/07/2013  . Vitamin D insufficiency 08/13/2011  . Morbid obesity ([place]) 08/10/2007  . ALLERGIC RHINITIS, SEASONAL 07/14/2006  . DM2 (diabetes mellitus, type 2) 05/28/2006  . DEPRESSIVE DISORDER, NOS 05/28/2006  . HTN (hypertension) 05/28/2006   Past Surgical History:  Procedure Laterality Date  . ABDOMINAL HYSTERECTOMY N/A 11/03/2012   Procedure: HYSTERECTOMY ABDOMINAL;  Surgeon: Ena Dawley, MD;  Location: [facility];  Service: Gynecology;  Laterality: N/A;  . BILATERAL SALPINGECTOMY Bilateral 11/03/2012   Procedure: BILATERAL SALPINGECTOMY;  Surgeon: Ena Dawley, MD;  Location: [facility];  Service: Gynecology;  Laterality: Bilateral;  . BURCH PROCEDURE N/A 11/03/2012   Procedure: BURCH PROCEDURE;  Surgeon: Ena Dawley, MD;  Location: [facility];  Service: Gynecology;  Laterality: N/A;  . CYSTOSCOPY N/A 11/03/2012   Procedure: CYSTOSCOPY;  Surgeon: Ena Dawley, MD;  Location: [facility];  Service: Gynecology;  Laterality: N/A;  . DILATION AND CURETTAGE OF UTERUS    . LAPAROSCOPY N/A 11/03/2012   Procedure: LAPAROSCOPY DIAGNOSTIC;  Surgeon: Ena Dawley, MD;  Location: [facility];  Service: Gynecology;  Laterality: N/A;  . TUBAL LIGATION  OB History    Gravida Para Term Preterm AB Living   6 3 3   3 3    SAB TAB Ectopic Multiple Live Births     3     3     Home Medications    Prior to Admission medications   Medication Sig Start Date End Date Taking? Authorizing Provider  albuterol (PROVENTIL HFA;VENTOLIN HFA) 108 (90 Base) MCG/ACT inhaler Inhale 2 puffs into the lungs every 6 (six) hours as needed for wheezing or shortness  of breath. 08/31/15   Josalyn Funches, MD  amLODipine (NORVASC) 5 MG tablet Take 1 tablet (5 mg total) by mouth daily. 08/31/15   Josalyn Funches, MD  furosemide (LASIX) 20 MG tablet Take 1 tablet (20 mg total) by mouth daily. As needed for swelling 08/31/15   Josalyn Funches, MD  Insulin Pen Needle (B-D ULTRAFINE III SHORT PEN) 31G X 8 MM MISC 1 application by Does not apply route daily. 06/19/14   Josalyn Funches, MD  Lancets (ONETOUCH ULTRASOFT) lancets 1 each by Other route 3 (three) times daily. E11.9 06/19/14   Josalyn Funches, MD  lisinopril-hydrochlorothiazide (PRINZIDE,ZESTORETIC) 20-12.5 MG tablet TAKE TWO TABLETS BY MOUTH ONCE DAILY 10/15/15   Boykin Nearing, MD  metFORMIN (GLUCOPHAGE) 1000 MG tablet Take 1 tablet (1,000 mg total) by mouth 2 (two) times daily with a meal. 11/21/15   Boykin Nearing, MD  ONE TOUCH ULTRA TEST test strip USE TO TEST DAILY BEFORE BREAKFAST 08/29/15   Boykin Nearing, MD   Family History Family History  Problem Relation Age of Onset  . Hypertension Brother   . Stroke Brother   . Sickle cell trait Daughter   . Hypertension Father   . Heart disease Mother   . Rheumatic fever Mother   . Hypertension Mother   . Stroke Mother   . Heart disease Paternal Aunt    Social History Social History  Substance Use Topics  . Smoking status: Never Smoker  . Smokeless tobacco: Never Used  . Alcohol use Yes     Comment: SOCIAL   Allergies   Other and Penicillins  Review of Systems Review of Systems  Musculoskeletal: Positive for arthralgias (left ankle).  Neurological: Negative for weakness and numbness.   Physical Exam Updated Vital Signs BP 146/81 (BP Location: Right Arm)   Pulse 65   Temp 98.5 F (36.9 C) (Oral)   Resp 18   Ht 5\' 5"  (1.651 m)   Wt 230 lb (104.3 kg)   LMP 06/29/2012   SpO2 99%   BMI 38.27 kg/m   Physical Exam  Constitutional: She appears well-developed and well-nourished.  HENT:  Head: Normocephalic.  Eyes: Conjunctivae are normal.   Cardiovascular: Normal rate.   Pulmonary/Chest: Effort normal. No respiratory distress.  Abdominal: She exhibits no distension.  Musculoskeletal: She exhibits edema and tenderness.  Left ankle with TTP around medial lateral malleolus region with some surrounding edema. Some crepitus noted as well. Decreased inversions and eversions secondary to pain. No TTP around the fifth metatarsal region. Able to dorsiflex and plantar flex the ankle. DP pulse is palpable. No pain of the left foot or knee.   Neurological: She is alert.  Skin: Skin is warm and dry.  Psychiatric: She has a normal mood and affect. Her behavior is normal.  Nursing note and vitals reviewed.  ED Treatments / Results  DIAGNOSTIC STUDIES: Oxygen Saturation is 99% on RA, normal by my interpretation.   COORDINATION OF CARE: 9:50 PM-Discussed next steps with pt. Pt  verbalized understanding and is agreeable with the plan.   Radiology Dg Ankle Complete Left  Result Date: 12/29/2015 CLINICAL DATA:  45 y/o  F; status post fall with ankle injury. EXAM: LEFT ANKLE COMPLETE - 3+ VIEW COMPARISON:  None. FINDINGS: Moderately displaced medial malleolar a minimally displaced fibular fracture at the level of the tibial plafond. There is slight widening of the medial ankle mortise. There is a lucency in the medial aspect of the talar dome probably representing an osteochondral injury. Ankle joint effusion. Soft tissue swelling about the ankle. No other fractures identified. Plantar calcaneal enthesophytes. IMPRESSION: Bimalleolar fracture with slight widening of the ankle mortise and suspected osteochondral injury of medial talar dome. Electronically Signed   By: Kristine Garbe M.D.   On: 12/29/2015 22:19   Procedures Procedures (including critical care time)  Medications Ordered in ED Medications  HYDROmorphone (DILAUDID) injection 1 mg (not administered)   Initial Impression / Assessment and Plan / ED Course  I have reviewed  the triage vital signs and the nursing notes.  Pertinent labs & imaging results that were available during my care of the patient were reviewed by me and considered in my medical decision making (see chart for details).  Clinical Course   Pt is a 45 yo female who presents into the ED s/p mechanical injury sustaining pain to the left ankle. Exam reveals TTP along the medial lateral malleolus region w/ associated crepitus. Pertinent imaging reveals Bimalleolar fracture with slight widening of the ankle mortise and suspected osteochondral injury of medial talar dome. Pain managed in the ED w/ 1mg  IM injection of Dilaudid. Discussed case w/ attending provider, Dr Tyrone Nine, and also consulted w/ Dr Ninfa Linden who was on-call for Dr Sharol Given at 10:40 PM. Dr Ninfa Linden advised to place pt in a posterior splint w/ crutches and to have her call their office on Monday for outpatient surgical management. Discussed this care plan w/ the pt who is agreeable. Answered all questions at bedside, and pt is comfortable/stable for d/c at this time.   Final Clinical Impressions(s) / ED Diagnoses   Final diagnoses:  Bimalleolar fracture of left ankle, closed, initial encounter   New Prescriptions New Prescriptions   OXYCODONE-ACETAMINOPHEN (PERCOCET/ROXICET) 5-325 MG TABLET    Take 1 tablet by mouth every 4 (four) hours as needed for severe pain.   I personally performed the services described in this documentation, which was scribed in my presence. The recorded information has been reviewed and is accurate.       Domenic Moras, PA-C 12/29/15 [place], DO 12/29/15 2302

## 2015-12-29 NOTE — Progress Notes (Signed)
Orthopedic Tech Progress Note Patient Details:  Julia Dunn 05-13-1970 [id number]  Ortho Devices Type of Ortho Device: Ace wrap, Crutches, Post (short leg) splint Ortho Device/Splint Location: LLE Ortho Device/Splint Interventions: Ordered, Application   Braulio Bosch 12/29/2015, 10:56 PM

## 2015-12-29 NOTE — ED Triage Notes (Addendum)
Dancing in the grass, did a twirl and lost balance. Twisted ankle while falling. Left ankle swollen. Already took an oxycodone 5mg  at 1630.

## 2016-01-01 ENCOUNTER — Ambulatory Visit (INDEPENDENT_AMBULATORY_CARE_PROVIDER_SITE_OTHER): Payer: Self-pay | Admitting: Orthopedic Surgery

## 2016-01-02 ENCOUNTER — Ambulatory Visit (INDEPENDENT_AMBULATORY_CARE_PROVIDER_SITE_OTHER): Payer: 59 | Admitting: Orthopaedic Surgery

## 2016-01-02 ENCOUNTER — Encounter (HOSPITAL_COMMUNITY)
Admission: RE | Admit: 2016-01-02 | Discharge: 2016-01-02 | Disposition: A | Discharge status: Home / Self Care | Payer: 59 | Source: Ambulatory Visit | Attending: Orthopaedic Surgery | Admitting: Orthopaedic Surgery

## 2016-01-02 ENCOUNTER — Other Ambulatory Visit: Payer: Self-pay | Admitting: Physician Assistant

## 2016-01-02 ENCOUNTER — Encounter (HOSPITAL_COMMUNITY): Payer: Self-pay

## 2016-01-02 DIAGNOSIS — I10 Essential (primary) hypertension: Secondary | ICD-10-CM | POA: Diagnosis not present

## 2016-01-02 DIAGNOSIS — E119 Type 2 diabetes mellitus without complications: Secondary | ICD-10-CM | POA: Diagnosis not present

## 2016-01-02 DIAGNOSIS — S82842A Displaced bimalleolar fracture of left lower leg, initial encounter for closed fracture: Secondary | ICD-10-CM | POA: Diagnosis present

## 2016-01-02 DIAGNOSIS — Z880 Allergy status to penicillin: Secondary | ICD-10-CM | POA: Diagnosis not present

## 2016-01-02 DIAGNOSIS — F329 Major depressive disorder, single episode, unspecified: Secondary | ICD-10-CM | POA: Diagnosis not present

## 2016-01-02 DIAGNOSIS — J45909 Unspecified asthma, uncomplicated: Secondary | ICD-10-CM | POA: Diagnosis not present

## 2016-01-02 DIAGNOSIS — K219 Gastro-esophageal reflux disease without esophagitis: Secondary | ICD-10-CM | POA: Diagnosis not present

## 2016-01-02 DIAGNOSIS — Y998 Other external cause status: Secondary | ICD-10-CM | POA: Diagnosis not present

## 2016-01-02 DIAGNOSIS — Z86718 Personal history of other venous thrombosis and embolism: Secondary | ICD-10-CM | POA: Diagnosis not present

## 2016-01-02 DIAGNOSIS — Z6838 Body mass index (BMI) 38.0-38.9, adult: Secondary | ICD-10-CM | POA: Diagnosis not present

## 2016-01-02 DIAGNOSIS — Y9389 Activity, other specified: Secondary | ICD-10-CM | POA: Diagnosis not present

## 2016-01-02 DIAGNOSIS — Y92017 Garden or yard in single-family (private) house as the place of occurrence of the external cause: Secondary | ICD-10-CM | POA: Diagnosis not present

## 2016-01-02 DIAGNOSIS — Z888 Allergy status to other drugs, medicaments and biological substances status: Secondary | ICD-10-CM | POA: Diagnosis not present

## 2016-01-02 DIAGNOSIS — W010XXA Fall on same level from slipping, tripping and stumbling without subsequent striking against object, initial encounter: Secondary | ICD-10-CM | POA: Diagnosis not present

## 2016-01-02 HISTORY — DX: Gastro-esophageal reflux disease without esophagitis: K21.9

## 2016-01-02 HISTORY — DX: Personal history of other diseases of the respiratory system: Z87.09

## 2016-01-02 HISTORY — DX: Other complications of anesthesia, initial encounter: T88.59XA

## 2016-01-02 HISTORY — DX: Personal history of other specified conditions: Z87.898

## 2016-01-02 HISTORY — DX: Pneumonia, unspecified organism: J18.9

## 2016-01-02 HISTORY — DX: Adverse effect of unspecified anesthetic, initial encounter: T41.45XA

## 2016-01-02 LAB — CBC
HCT: 38 % (ref 36.0–46.0)
HEMOGLOBIN: 12.7 g/dL (ref 12.0–15.0)
MCH: 29.4 pg (ref 26.0–34.0)
MCHC: 33.4 g/dL (ref 30.0–36.0)
MCV: 88 fL (ref 78.0–100.0)
PLATELETS: 211 10*3/uL (ref 150–400)
RBC: 4.32 MIL/uL (ref 3.87–5.11)
RDW: 13 % (ref 11.5–15.5)
WBC: 6.6 10*3/uL (ref 4.0–10.5)

## 2016-01-02 LAB — BASIC METABOLIC PANEL
Anion gap: 6 (ref 5–15)
BUN: 11 mg/dL (ref 6–20)
CHLORIDE: 101 mmol/L (ref 101–111)
CO2: 29 mmol/L (ref 22–32)
CREATININE: 0.92 mg/dL (ref 0.44–1.00)
Calcium: 9 mg/dL (ref 8.9–10.3)
GFR calc non Af Amer: >60 mL/min (ref >60)
Glucose, Bld: 161 mg/dL — ABNORMAL HIGH (ref 65–99)
Potassium: 3.7 mmol/L (ref 3.5–5.1)
Sodium: 136 mmol/L (ref 135–145)

## 2016-01-02 NOTE — Progress Notes (Signed)
Pt is being scheduled for preop appt; please place surgical orders in epic. Thanks.  

## 2016-01-02 NOTE — Patient Instructions (Signed)
Julia Dunn  01/02/2016   Your procedure is scheduled on: Friday January 04, 2016   Report to [facility] Main  Entrance take [place]  elevators to 3rd floor to  [place] at 5:00 PM.  Call this number if you have problems the morning of surgery [phone]   Remember: ONLY 1 PERSON MAY GO WITH YOU TO SHORT STAY TO GET  [place].  Do not eat food After Midnight but may take clear liquid diet till 1:00 pm day of surgery then nothing by mouth.      Take these medicines the morning of surgery with A SIP OF WATER: Amlodipine (Norvasc); May take Oxycodone-Acetaminophen if needed; May use albuterol inhaler if needed  DO NOT TAKE ANY DIABETIC MEDICATIONS DAY OF YOUR SURGERY                               You may not have any metal on your body including hair pins and              piercings  Do not wear jewelry, make-up, lotions, powders or perfumes, deodorant             Do not wear nail polish.  Do not shave  48 hours prior to surgery.            Do not bring valuables to the hospital. [place].  Contacts, dentures or bridgework may not be worn into surgery.  Leave suitcase in the car. After surgery it may be brought to your room.   _____________________________________________________________________             [facility] - Preparing for Surgery Before surgery, you can play an important role.  Because skin is not sterile, your skin needs to be as free of germs as possible.  You can reduce the number of germs on your skin by washing with CHG (chlorahexidine gluconate) soap before surgery.  CHG is an antiseptic cleaner which kills germs and bonds with the skin to continue killing germs even after washing. Please DO NOT use if you have an allergy to CHG or antibacterial soaps.  If your skin becomes reddened/irritated stop using the CHG and inform your nurse when you arrive at Short Stay. Do  not shave (including legs and underarms) for at least 48 hours prior to the first CHG shower.  You may shave your face/neck. Please follow these instructions carefully:  1.  Shower with CHG Soap the night before surgery and the  morning of Surgery.  2.  If you choose to wash your hair, wash your hair first as usual with your  normal  shampoo.  3.  After you shampoo, rinse your hair and body thoroughly to remove the  shampoo.                           4.  Use CHG as you would any other liquid soap.  You can apply chg directly  to the skin and wash                       Gently with a scrungie or clean washcloth.  5.  Apply the CHG Soap to your body ONLY FROM THE NECK DOWN.   Do not use on face/ open                           Wound or open sores. Avoid contact with eyes, ears mouth and genitals (private parts).                       Wash face,  Genitals (private parts) with your normal soap.             6.  Wash thoroughly, paying special attention to the area where your surgery  will be performed.  7.  Thoroughly rinse your body with warm water from the neck down.  8.  DO NOT shower/wash with your normal soap after using and rinsing off  the CHG Soap.                9.  Pat yourself dry with a clean towel.            10.  Wear clean pajamas.            11.  Place clean sheets on your bed the night of your first shower and do not  sleep with pets. Day of Surgery : Do not apply any lotions/deodorants the morning of surgery.  Please wear clean clothes to the hospital/surgery center.  FAILURE TO FOLLOW THESE INSTRUCTIONS MAY RESULT IN THE CANCELLATION OF YOUR SURGERY PATIENT SIGNATURE_________________________________  NURSE SIGNATURE__________________________________  ________________________________________________________________________    CLEAR LIQUID DIET   Foods Allowed                                                                     Foods Excluded  Coffee and tea, regular and  decaf                             liquids that you cannot  Plain Jell-O in any flavor                                             see through such as: Fruit ices (not with fruit pulp)                                     milk, soups, orange juice  Iced Popsicles                                    All solid food Carbonated beverages, regular and diet                                    Cranberry, grape and apple juices Sports drinks like Gatorade Lightly seasoned clear broth or consume(fat free) Sugar, honey syrup  Sample Menu Breakfast  Lunch                                     Supper Cranberry juice                    Beef broth                            Chicken broth Jell-O                                     Grape juice                           Apple juice Coffee or tea                        Jell-O                                      Popsicle                                                Coffee or tea                        Coffee or tea  _____________________________________________________________________

## 2016-01-02 NOTE — Progress Notes (Signed)
Your patient has screened at an elevated risk for Obstructive Sleep Apnea using the Stop-Bang Tool during a pre-surgical visit. Patient scored at high risk.  

## 2016-01-03 LAB — HEMOGLOBIN A1C
Hgb A1c MFr Bld: 6.9 % — ABNORMAL HIGH (ref 4.8–5.6)
Mean Plasma Glucose: 151 mg/dL

## 2016-01-04 ENCOUNTER — Ambulatory Visit (HOSPITAL_COMMUNITY): Payer: 59 | Admitting: Anesthesiology

## 2016-01-04 ENCOUNTER — Encounter (HOSPITAL_COMMUNITY): Payer: Self-pay | Admitting: *Deleted

## 2016-01-04 ENCOUNTER — Ambulatory Visit (HOSPITAL_COMMUNITY)
Admission: RE | Admit: 2016-01-04 | Discharge: 2016-01-06 | Disposition: A | Discharge status: Home / Self Care | Payer: 59 | Source: Ambulatory Visit | Attending: Orthopaedic Surgery | Admitting: Orthopaedic Surgery

## 2016-01-04 ENCOUNTER — Observation Stay (HOSPITAL_COMMUNITY): Payer: 59

## 2016-01-04 ENCOUNTER — Encounter (HOSPITAL_COMMUNITY)
Admission: RE | Disposition: A | Discharge status: Home / Self Care | Payer: Self-pay | Source: Ambulatory Visit | Attending: Orthopaedic Surgery

## 2016-01-04 DIAGNOSIS — K219 Gastro-esophageal reflux disease without esophagitis: Secondary | ICD-10-CM | POA: Insufficient documentation

## 2016-01-04 DIAGNOSIS — I10 Essential (primary) hypertension: Secondary | ICD-10-CM | POA: Insufficient documentation

## 2016-01-04 DIAGNOSIS — S82842A Displaced bimalleolar fracture of left lower leg, initial encounter for closed fracture: Secondary | ICD-10-CM

## 2016-01-04 DIAGNOSIS — Y92017 Garden or yard in single-family (private) house as the place of occurrence of the external cause: Secondary | ICD-10-CM | POA: Insufficient documentation

## 2016-01-04 DIAGNOSIS — Z6838 Body mass index (BMI) 38.0-38.9, adult: Secondary | ICD-10-CM | POA: Insufficient documentation

## 2016-01-04 DIAGNOSIS — Y9389 Activity, other specified: Secondary | ICD-10-CM | POA: Insufficient documentation

## 2016-01-04 DIAGNOSIS — Z86718 Personal history of other venous thrombosis and embolism: Secondary | ICD-10-CM | POA: Insufficient documentation

## 2016-01-04 DIAGNOSIS — Z888 Allergy status to other drugs, medicaments and biological substances status: Secondary | ICD-10-CM | POA: Insufficient documentation

## 2016-01-04 DIAGNOSIS — J45909 Unspecified asthma, uncomplicated: Secondary | ICD-10-CM | POA: Insufficient documentation

## 2016-01-04 DIAGNOSIS — Z880 Allergy status to penicillin: Secondary | ICD-10-CM | POA: Insufficient documentation

## 2016-01-04 DIAGNOSIS — E119 Type 2 diabetes mellitus without complications: Secondary | ICD-10-CM | POA: Insufficient documentation

## 2016-01-04 DIAGNOSIS — W010XXA Fall on same level from slipping, tripping and stumbling without subsequent striking against object, initial encounter: Secondary | ICD-10-CM | POA: Insufficient documentation

## 2016-01-04 DIAGNOSIS — Y998 Other external cause status: Secondary | ICD-10-CM | POA: Insufficient documentation

## 2016-01-04 DIAGNOSIS — Z419 Encounter for procedure for purposes other than remedying health state, unspecified: Secondary | ICD-10-CM

## 2016-01-04 DIAGNOSIS — F329 Major depressive disorder, single episode, unspecified: Secondary | ICD-10-CM | POA: Insufficient documentation

## 2016-01-04 HISTORY — PX: ORIF ANKLE FRACTURE: SHX5408

## 2016-01-04 LAB — GLUCOSE, CAPILLARY
GLUCOSE-CAPILLARY: 123 mg/dL — AB (ref 65–99)
GLUCOSE-CAPILLARY: 138 mg/dL — AB (ref 65–99)
GLUCOSE-CAPILLARY: 231 mg/dL — AB (ref 65–99)

## 2016-01-04 SURGERY — OPEN REDUCTION INTERNAL FIXATION (ORIF) ANKLE FRACTURE
Anesthesia: Regional | Site: Ankle | Laterality: Left

## 2016-01-04 MED ORDER — CLINDAMYCIN PHOSPHATE 600 MG/50ML IV SOLN
600.0000 mg | Freq: Four times a day (QID) | INTRAVENOUS | Status: AC
  Administered 2016-01-04 – 2016-01-05 (×3): 600 mg via INTRAVENOUS
  Filled 2016-01-04 (×3): qty 50

## 2016-01-04 MED ORDER — LABETALOL HCL 5 MG/ML IV SOLN
INTRAVENOUS | Status: DC | PRN
  Administered 2016-01-04: 5 mg via INTRAVENOUS

## 2016-01-04 MED ORDER — INSULIN ASPART 100 UNIT/ML ~~LOC~~ SOLN
0.0000 [IU] | Freq: Three times a day (TID) | SUBCUTANEOUS | Status: DC
  Administered 2016-01-05: 2 [IU] via SUBCUTANEOUS
  Administered 2016-01-05: 5 [IU] via SUBCUTANEOUS
  Administered 2016-01-05 – 2016-01-06 (×2): 8 [IU] via SUBCUTANEOUS
  Administered 2016-01-06: 3 [IU] via SUBCUTANEOUS

## 2016-01-04 MED ORDER — DEXAMETHASONE SODIUM PHOSPHATE 10 MG/ML IJ SOLN
INTRAMUSCULAR | Status: AC
  Filled 2016-01-04: qty 1

## 2016-01-04 MED ORDER — DEXAMETHASONE SODIUM PHOSPHATE 10 MG/ML IJ SOLN
INTRAMUSCULAR | Status: DC | PRN
  Administered 2016-01-04: 10 mg via INTRAVENOUS

## 2016-01-04 MED ORDER — AMLODIPINE BESYLATE 5 MG PO TABS
5.0000 mg | ORAL_TABLET | Freq: Every day | ORAL | Status: DC
  Administered 2016-01-05 – 2016-01-06 (×2): 5 mg via ORAL
  Filled 2016-01-04 (×2): qty 1

## 2016-01-04 MED ORDER — ASPIRIN 325 MG PO TABS
325.0000 mg | ORAL_TABLET | Freq: Every day | ORAL | Status: DC
  Administered 2016-01-05 – 2016-01-06 (×2): 325 mg via ORAL
  Filled 2016-01-04 (×2): qty 1

## 2016-01-04 MED ORDER — HYDROCHLOROTHIAZIDE 25 MG PO TABS
25.0000 mg | ORAL_TABLET | Freq: Every day | ORAL | Status: DC
  Administered 2016-01-04 – 2016-01-06 (×3): 25 mg via ORAL
  Filled 2016-01-04 (×3): qty 1

## 2016-01-04 MED ORDER — FUROSEMIDE 20 MG PO TABS
20.0000 mg | ORAL_TABLET | Freq: Every day | ORAL | Status: DC
  Administered 2016-01-05 – 2016-01-06 (×2): 20 mg via ORAL
  Filled 2016-01-04 (×2): qty 1

## 2016-01-04 MED ORDER — DIPHENHYDRAMINE HCL 12.5 MG/5ML PO ELIX: 12.5000 mg | ORAL_SOLUTION | ORAL | Status: DC | PRN

## 2016-01-04 MED ORDER — METOCLOPRAMIDE HCL 5 MG/ML IJ SOLN: 5.0000 mg | Freq: Three times a day (TID) | INTRAMUSCULAR | Status: DC | PRN

## 2016-01-04 MED ORDER — SODIUM CHLORIDE 0.9 % IV SOLN
INTRAVENOUS | Status: DC
  Administered 2016-01-04: 21:00:00 via INTRAVENOUS

## 2016-01-04 MED ORDER — ROCURONIUM BROMIDE 10 MG/ML (PF) SYRINGE
PREFILLED_SYRINGE | INTRAVENOUS | Status: AC
  Filled 2016-01-04: qty 10

## 2016-01-04 MED ORDER — BUPIVACAINE HCL (PF) 0.5 % IJ SOLN
INTRAMUSCULAR | Status: AC
  Filled 2016-01-04: qty 60

## 2016-01-04 MED ORDER — ACETAMINOPHEN 650 MG RE SUPP: 650.0000 mg | Freq: Four times a day (QID) | RECTAL | Status: DC | PRN

## 2016-01-04 MED ORDER — MIDAZOLAM HCL 5 MG/5ML IJ SOLN
INTRAMUSCULAR | Status: DC | PRN
  Administered 2016-01-04: 1 mg via INTRAVENOUS

## 2016-01-04 MED ORDER — 0.9 % SODIUM CHLORIDE (POUR BTL) OPTIME
TOPICAL | Status: DC | PRN
  Administered 2016-01-04: 1000 mL

## 2016-01-04 MED ORDER — ONDANSETRON HCL 4 MG/2ML IJ SOLN
4.0000 mg | Freq: Four times a day (QID) | INTRAMUSCULAR | Status: DC | PRN
  Administered 2016-01-05: 4 mg via INTRAVENOUS
  Filled 2016-01-04 (×2): qty 2

## 2016-01-04 MED ORDER — OXYCODONE HCL 5 MG PO TABS
5.0000 mg | ORAL_TABLET | ORAL | Status: DC | PRN
  Administered 2016-01-06 (×2): 10 mg via ORAL
  Filled 2016-01-04 (×2): qty 2

## 2016-01-04 MED ORDER — FENTANYL CITRATE (PF) 100 MCG/2ML IJ SOLN
INTRAMUSCULAR | Status: AC
  Filled 2016-01-04: qty 2

## 2016-01-04 MED ORDER — BUPIVACAINE-EPINEPHRINE (PF) 0.5% -1:200000 IJ SOLN
INTRAMUSCULAR | Status: DC | PRN
  Administered 2016-01-04: 20 mL
  Administered 2016-01-04: 30 mL via PERINEURAL

## 2016-01-04 MED ORDER — ONDANSETRON HCL 4 MG PO TABS: 4.0000 mg | ORAL_TABLET | Freq: Four times a day (QID) | ORAL | Status: DC | PRN

## 2016-01-04 MED ORDER — PROPOFOL 10 MG/ML IV BOLUS
INTRAVENOUS | Status: DC | PRN
  Administered 2016-01-04: 200 mg via INTRAVENOUS

## 2016-01-04 MED ORDER — CLINDAMYCIN PHOSPHATE 900 MG/50ML IV SOLN
900.0000 mg | INTRAVENOUS | Status: AC
  Administered 2016-01-04: 900 mg via INTRAVENOUS

## 2016-01-04 MED ORDER — METFORMIN HCL 500 MG PO TABS
1000.0000 mg | ORAL_TABLET | Freq: Two times a day (BID) | ORAL | Status: DC
  Administered 2016-01-05: 1000 mg via ORAL
  Filled 2016-01-04 (×2): qty 2

## 2016-01-04 MED ORDER — PROPOFOL 10 MG/ML IV BOLUS
INTRAVENOUS | Status: AC
  Filled 2016-01-04: qty 20

## 2016-01-04 MED ORDER — CLINDAMYCIN PHOSPHATE 900 MG/50ML IV SOLN
INTRAVENOUS | Status: AC
  Filled 2016-01-04: qty 50

## 2016-01-04 MED ORDER — MIDAZOLAM HCL 2 MG/2ML IJ SOLN
INTRAMUSCULAR | Status: AC
  Filled 2016-01-04: qty 2

## 2016-01-04 MED ORDER — METHOCARBAMOL 500 MG PO TABS
500.0000 mg | ORAL_TABLET | Freq: Four times a day (QID) | ORAL | Status: DC | PRN
  Administered 2016-01-05 – 2016-01-06 (×2): 500 mg via ORAL
  Filled 2016-01-04 (×2): qty 1

## 2016-01-04 MED ORDER — LISINOPRIL-HYDROCHLOROTHIAZIDE 20-12.5 MG PO TABS: 2.0000 | ORAL_TABLET | Freq: Every day | ORAL | Status: DC

## 2016-01-04 MED ORDER — METOCLOPRAMIDE HCL 5 MG PO TABS: 5.0000 mg | ORAL_TABLET | Freq: Three times a day (TID) | ORAL | Status: DC | PRN

## 2016-01-04 MED ORDER — ALBUTEROL SULFATE (2.5 MG/3ML) 0.083% IN NEBU: 2.5000 mg | INHALATION_SOLUTION | Freq: Four times a day (QID) | RESPIRATORY_TRACT | Status: DC | PRN

## 2016-01-04 MED ORDER — FENTANYL CITRATE (PF) 250 MCG/5ML IJ SOLN
INTRAMUSCULAR | Status: DC | PRN
  Administered 2016-01-04 (×4): 50 ug via INTRAVENOUS

## 2016-01-04 MED ORDER — METHOCARBAMOL 1000 MG/10ML IJ SOLN
500.0000 mg | Freq: Four times a day (QID) | INTRAVENOUS | Status: DC | PRN
  Filled 2016-01-04: qty 5

## 2016-01-04 MED ORDER — ACETAMINOPHEN 325 MG PO TABS: 650.0000 mg | ORAL_TABLET | Freq: Four times a day (QID) | ORAL | Status: DC | PRN

## 2016-01-04 MED ORDER — LISINOPRIL 20 MG PO TABS
40.0000 mg | ORAL_TABLET | Freq: Every day | ORAL | Status: DC
  Administered 2016-01-04 – 2016-01-06 (×3): 40 mg via ORAL
  Filled 2016-01-04 (×3): qty 2

## 2016-01-04 MED ORDER — KETOROLAC TROMETHAMINE 30 MG/ML IJ SOLN: 30.0000 mg | Freq: Once | INTRAMUSCULAR | Status: DC | PRN

## 2016-01-04 MED ORDER — LIDOCAINE 2% (20 MG/ML) 5 ML SYRINGE
INTRAMUSCULAR | Status: AC
  Filled 2016-01-04: qty 5

## 2016-01-04 MED ORDER — HYDROCODONE-ACETAMINOPHEN 7.5-325 MG PO TABS: 1.0000 | ORAL_TABLET | Freq: Once | ORAL | Status: DC | PRN

## 2016-01-04 MED ORDER — LACTATED RINGERS IV SOLN
INTRAVENOUS | Status: DC | PRN
  Administered 2016-01-04 (×2): via INTRAVENOUS

## 2016-01-04 MED ORDER — LABETALOL HCL 5 MG/ML IV SOLN
INTRAVENOUS | Status: AC
  Filled 2016-01-04: qty 4

## 2016-01-04 MED ORDER — HYDROMORPHONE HCL 1 MG/ML IJ SOLN: 0.2500 mg | INTRAMUSCULAR | Status: DC | PRN

## 2016-01-04 MED ORDER — HYDROMORPHONE HCL 1 MG/ML IJ SOLN
1.0000 mg | INTRAMUSCULAR | Status: DC | PRN
  Administered 2016-01-05 – 2016-01-06 (×3): 1 mg via INTRAVENOUS
  Filled 2016-01-04 (×3): qty 1

## 2016-01-04 MED ORDER — CHLORHEXIDINE GLUCONATE 4 % EX LIQD: 60.0000 mL | Freq: Once | CUTANEOUS | Status: DC

## 2016-01-04 MED ORDER — ONDANSETRON HCL 4 MG/2ML IJ SOLN
INTRAMUSCULAR | Status: AC
  Filled 2016-01-04: qty 2

## 2016-01-04 MED ORDER — PROMETHAZINE HCL 25 MG/ML IJ SOLN: 6.2500 mg | INTRAMUSCULAR | Status: DC | PRN

## 2016-01-04 MED ORDER — ONDANSETRON HCL 4 MG/2ML IJ SOLN
INTRAMUSCULAR | Status: DC | PRN
  Administered 2016-01-04: 4 mg via INTRAVENOUS

## 2016-01-04 SURGICAL SUPPLY — 50 items
BANDAGE ACE 4X5 VEL STRL LF (GAUZE/BANDAGES/DRESSINGS) ×3 IMPLANT
BANDAGE ACE 6X5 VEL STRL LF (GAUZE/BANDAGES/DRESSINGS) ×3 IMPLANT
BIT DRILL CANN 2.7 (BIT) ×1
BIT DRILL CANN 2.7MM (BIT) ×1
BIT DRILL SRG 2.7XCANN AO CPLG (BIT) ×1 IMPLANT
BIT DRL SRG 2.7XCANN AO CPLNG (BIT) ×1
CLOTH BEACON ORANGE TIMEOUT ST (SAFETY) ×3 IMPLANT
CUFF TOURN SGL QUICK 34 (TOURNIQUET CUFF) ×2
CUFF TRNQT CYL 34X4X40X1 (TOURNIQUET CUFF) ×1 IMPLANT
DECANTER SPIKE VIAL GLASS SM (MISCELLANEOUS) IMPLANT
DRAPE C-ARM 42X120 X-RAY (DRAPES) ×3 IMPLANT
DRAPE C-ARMOR (DRAPES) ×3 IMPLANT
DRAPE U-SHAPE 47X51 STRL (DRAPES) ×3 IMPLANT
DRILL 2.6X122MM WL AO SHAFT (BIT) ×3 IMPLANT
DRSG ADAPTIC 3X8 NADH LF (GAUZE/BANDAGES/DRESSINGS) ×3 IMPLANT
DRSG PAD ABDOMINAL 8X10 ST (GAUZE/BANDAGES/DRESSINGS) ×6 IMPLANT
DRSG XEROFORM 1X8 (GAUZE/BANDAGES/DRESSINGS) ×3 IMPLANT
DURAPREP 26ML APPLICATOR (WOUND CARE) ×3 IMPLANT
ELECT REM PT RETURN 9FT ADLT (ELECTROSURGICAL) ×3
ELECTRODE REM PT RTRN 9FT ADLT (ELECTROSURGICAL) ×1 IMPLANT
GAUZE SPONGE 4X4 12PLY STRL (GAUZE/BANDAGES/DRESSINGS) ×3 IMPLANT
GLOVE BIO SURGEON STRL SZ7.5 (GLOVE) ×3 IMPLANT
GLOVE BIOGEL PI IND STRL 8 (GLOVE) ×2 IMPLANT
GLOVE BIOGEL PI INDICATOR 8 (GLOVE) ×4
GLOVE ECLIPSE 8.0 STRL XLNG CF (GLOVE) ×3 IMPLANT
GOWN STRL REUS W/TWL XL LVL3 (GOWN DISPOSABLE) ×6 IMPLANT
K-WIRE ORTHOPEDIC 1.4X150L (WIRE) ×6
KWIRE ORTHOPEDIC 1.4X150L (WIRE) ×2 IMPLANT
PACK TOTAL KNEE CUSTOM (KITS) ×3 IMPLANT
PAD ABD 8X10 STRL (GAUZE/BANDAGES/DRESSINGS) ×3 IMPLANT
PAD CAST 4YDX4 CTTN HI CHSV (CAST SUPPLIES) ×3 IMPLANT
PADDING CAST COTTON 4X4 STRL (CAST SUPPLIES) ×6
PLATE FIBULA 4H (Plate) ×3 IMPLANT
POSITIONER SURGICAL ARM (MISCELLANEOUS) ×3 IMPLANT
SCREW 3.5X10MM (Screw) ×3 IMPLANT
SCREW 50X4.0MM (Screw) ×3 IMPLANT
SCREW BONE ANKLE 3.5X12MM (Screw) ×6 IMPLANT
SCREW BONE ANKLE 3.5X14MM (Screw) ×3 IMPLANT
SCREW BONE NON-LCKING 3.5X12MM (Screw) ×9 IMPLANT
SPLINT PLASTER CAST XFAST 5X30 (CAST SUPPLIES) ×1 IMPLANT
SPLINT PLASTER XFAST SET 5X30 (CAST SUPPLIES) ×2
STOCKINETTE 4X48 STRL (DRAPES) ×3 IMPLANT
STOCKINETTE 8 INCH (MISCELLANEOUS) ×3 IMPLANT
SUT ETHILON 2 0 PS N (SUTURE) ×9 IMPLANT
SUT PROLENE 6 0 P 1 18 (SUTURE) ×3 IMPLANT
SUT VIC AB 0 CT1 36 (SUTURE) ×6 IMPLANT
SUT VIC AB 2-0 CT1 27 (SUTURE) ×6
SUT VIC AB 2-0 CT1 TAPERPNT 27 (SUTURE) ×3 IMPLANT
SYR CONTROL 10ML LL (SYRINGE) IMPLANT
TOWEL OR 17X26 10 PK STRL BLUE (TOWEL DISPOSABLE) ×3 IMPLANT

## 2016-01-04 NOTE — Anesthesia Procedure Notes (Signed)
Procedure Name: LMA Insertion Date/Time: 01/04/2016 6:09 PM Performed by: Enrigue Catena E Pre-anesthesia Checklist: Patient identified, Emergency Drugs available, Suction available and Patient being monitored Patient Re-evaluated:Patient Re-evaluated prior to inductionOxygen Delivery Method: Circle system utilized Preoxygenation: Pre-oxygenation with 100% oxygen Intubation Type: IV induction Ventilation: Mask ventilation without difficulty LMA: LMA with gastric port inserted LMA Size: 4.0 Tube type: Oral Number of attempts: 1 Placement Confirmation: positive ETCO2 and breath sounds checked- equal and bilateral Tube secured with: Tape Dental Injury: Teeth and Oropharynx as per pre-operative assessment

## 2016-01-04 NOTE — H&P (Signed)
Julia Dunn is an 45 y.o. female.   Chief Complaint:   Left ankle pain with known unstable frqacture HPI:   Pleasant 45 yo female who sustained an accidental mechanical fall last weekend injuring her left ankle.  Was seen in the ED and found to have a left bimalleolar ankle fracture.  Surgery has been recommended due to her deformity.  Past Medical History:  Diagnosis Date  . Anemia Dx 2013  . Asthma   . Bacterial vaginosis 2008  . Complication of anesthesia    pt states was informed had difficult time awakening following hysterectomy   . Depression   . Depression   . Diabetes mellitus Dx 2005  . Fibroids 09/05/2011   08/08/2011: Anteverted uterus. Diffuse fibroid involvement. Left of midline fibroid with submucosal component. 3.4cm x 2.8cmx 3.4cm.   Marland Kitchen GERD (gastroesophageal reflux disease)   . Gestational diabetes   . H/O shortness of breath    pt states has improved since lossing weight; triggers currently are enviromental pt states can make it to top of stairs w/o having to stop to catch her breath  . Headache(784.0)    migraines  . History of blood transfusion 2013/2014  . History of bronchitis   . Hx of blood clots   . Hypertension Dx 2015  . Kidney infection 2015  . Menorrhagia 09/27/2010  . Pneumonia   . Postpartum depression   . Pregnancy induced hypertension   . Trichomonas     Past Surgical History:  Procedure Laterality Date  . ABDOMINAL HYSTERECTOMY N/A 11/03/2012   Procedure: HYSTERECTOMY ABDOMINAL;  Surgeon: Ena Dawley, MD;  Location: [facility];  Service: Gynecology;  Laterality: N/A;  . BILATERAL SALPINGECTOMY Bilateral 11/03/2012   Procedure: BILATERAL SALPINGECTOMY;  Surgeon: Ena Dawley, MD;  Location: [facility];  Service: Gynecology;  Laterality: Bilateral;  . BURCH PROCEDURE N/A 11/03/2012   Procedure: BURCH PROCEDURE;  Surgeon: Ena Dawley, MD;  Location: [facility];  Service: Gynecology;  Laterality: N/A;  . CYSTOSCOPY N/A 11/03/2012   Procedure: CYSTOSCOPY;   Surgeon: Ena Dawley, MD;  Location: [facility];  Service: Gynecology;  Laterality: N/A;  . DILATION AND CURETTAGE OF UTERUS    . LAPAROSCOPY N/A 11/03/2012   Procedure: LAPAROSCOPY DIAGNOSTIC;  Surgeon: Ena Dawley, MD;  Location: [facility];  Service: Gynecology;  Laterality: N/A;  . TUBAL LIGATION    . WISDOM TOOTH EXTRACTION     3 teeth    Family History  Problem Relation Age of Onset  . Hypertension Brother   . Stroke Brother   . Sickle cell trait Daughter   . Hypertension Father   . Heart disease Mother   . Rheumatic fever Mother   . Hypertension Mother   . Stroke Mother   . Heart disease Paternal Aunt    Social History:  reports that she has never smoked. She has never used smokeless tobacco. She reports that she drinks alcohol. She reports that she does not use drugs.  Allergies:  Allergies  Allergen Reactions  . Other Swelling    Patient says she is unsure of caused the swelling but one of the meds given to her on the 8/6 for her surgery had her extreamly swollen   . Penicillins Hives    REACTION: unspecified Has patient had a PCN reaction causing immediate rash, facial/tongue/throat swelling, SOB or lightheadedness with hypotension: No Has patient had a PCN reaction causing severe rash involving mucus membranes or skin necrosis: No Has patient had a PCN reaction that required hospitalization  No Has patient had a PCN reaction occurring within the last 10 years: No If all of the above answers are "NO", then may proceed with Cephalosporin use.     No prescriptions prior to admission.    Results for orders placed or performed during the hospital encounter of 01/02/16 (from the past 48 hour(s))  Hemoglobin A1c     Status: Abnormal   Collection Time: 01/02/16 12:25 PM  Result Value Ref Range   Hgb A1c MFr Bld 6.9 (H) 4.8 - 5.6 %    Comment: (NOTE)         Pre-diabetes: 5.7 - 6.4         Diabetes: >6.4         Glycemic control for adults with diabetes: <7.0    Mean  Plasma Glucose 151 mg/dL    Comment: (NOTE) Performed At: [facility] [place], [place] [id number] Lindon Romp MD [id number]   Basic metabolic panel     Status: Abnormal   Collection Time: 01/02/16 12:25 PM  Result Value Ref Range   Sodium 136 135 - 145 mmol/L   Potassium 3.7 3.5 - 5.1 mmol/L   Chloride 101 101 - 111 mmol/L   CO2 29 22 - 32 mmol/L   Glucose, Bld 161 (H) 65 - 99 mg/dL   BUN 11 6 - 20 mg/dL   Creatinine, Ser 0.92 0.44 - 1.00 mg/dL   Calcium 9.0 8.9 - 10.3 mg/dL   GFR calc non Af Amer >60 >60 mL/min   GFR calc Af Amer >60 >60 mL/min    Comment: (NOTE) The eGFR has been calculated using the CKD EPI equation. This calculation has not been validated in all clinical situations. eGFR's persistently <60 mL/min signify possible Chronic Kidney Disease.    Anion gap 6 5 - 15  CBC     Status: None   Collection Time: 01/02/16 12:25 PM  Result Value Ref Range   WBC 6.6 4.0 - 10.5 K/uL   RBC 4.32 3.87 - 5.11 MIL/uL   Hemoglobin 12.7 12.0 - 15.0 g/dL   HCT 38.0 36.0 - 46.0 %   MCV 88.0 78.0 - 100.0 fL   MCH 29.4 26.0 - 34.0 pg   MCHC 33.4 30.0 - 36.0 g/dL   RDW 13.0 11.5 - 15.5 %   Platelets 211 150 - 400 K/uL   No results found.  Review of Systems  All other systems reviewed and are negative.   Last menstrual period 06/29/2012. Physical Exam  Constitutional: She is oriented to person, place, and time. She appears well-developed and well-nourished.  HENT:  Head: Normocephalic and atraumatic.  Eyes: EOM are normal. Pupils are equal, round, and reactive to light.  Neck: Normal range of motion. Neck supple.  Cardiovascular: Normal rate and regular rhythm.   Respiratory: Effort normal and breath sounds normal.  GI: Soft. Bowel sounds are normal.  Musculoskeletal:       Left ankle: She exhibits decreased range of motion, swelling, ecchymosis and deformity. Tenderness. Lateral malleolus and medial malleolus tenderness found.   Neurological: She is alert and oriented to person, place, and time.  Skin: Skin is warm and dry.  Psychiatric: She has a normal mood and affect.     Assessment/Plan Left unstable bimalleolar ankle fracture 1)  To the OR today for open reduction/internal fixation of her left ankle fracture followed by admission overnight for pain control, antibiotics and therapy for mobility.  Risks and benefits have been discussed in detail.  Mcarthur Rossetti, MD 01/04/2016, 7:55 AM

## 2016-01-04 NOTE — Brief Op Note (Signed)
01/04/2016  7:31 PM  PATIENT:  Julia Dunn  45 y.o. female  PRE-OPERATIVE DIAGNOSIS:  Left bimalleolar ankle fracture  POST-OPERATIVE DIAGNOSIS:  Left bimalleolar ankle fracture  PROCEDURE:  Procedure(s): OPEN REDUCTION INTERNAL FIXATION (ORIF) LEFT BIMALLEOLAR ANKLE FRACTURE (Left)  SURGEON:  Surgeon(s) and Role:    * Mcarthur Rossetti, MD - Primary  PHYSICIAN ASSISTANT: Benita Stabile, PA-C  ANESTHESIA:   regional and general  COUNTS:  YES  TOURNIQUET:  * Missing tourniquet times found for documented tourniquets in log:  [id number] *  DICTATION: .Other Dictation: Dictation Number [id number]  PLAN OF CARE: Admit for overnight observation  PATIENT DISPOSITION:  PACU - hemodynamically stable.   Delay start of Pharmacological VTE agent (>24hrs) due to surgical blood loss or risk of bleeding: no

## 2016-01-04 NOTE — Anesthesia Preprocedure Evaluation (Addendum)
Anesthesia Evaluation  Patient identified by MRN, date of birth, ID band Patient awake    Reviewed: Allergy & Precautions, NPO status , Patient's Chart, lab work & pertinent test results  Airway Mallampati: II  TM Distance: >3 FB Neck ROM: Full    Dental   Pulmonary asthma ,    breath sounds clear to auscultation       Cardiovascular hypertension, Pt. on medications  Rhythm:Regular Rate:Normal  08/2013 Nuclear stress test: IMPRESSION: 1. Negative for pharmacologic-stress induced ischemia.  2. Left ventricular ejection fraction 48%.   Neuro/Psych Depression negative neurological ROS     GI/Hepatic Neg liver ROS, GERD  ,  Endo/Other  diabetes, Type 2, Insulin Dependent, Oral Hypoglycemic AgentsMorbid obesity  Renal/GU Renal disease     Musculoskeletal   Abdominal   Peds  Hematology negative hematology ROS (+)   Anesthesia Other Findings   Reproductive/Obstetrics                            Lab Results  Component Value Date   WBC 6.6 01/02/2016   HGB 12.7 01/02/2016   HCT 38.0 01/02/2016   MCV 88.0 01/02/2016   PLT 211 01/02/2016   Lab Results  Component Value Date   CREATININE 0.92 01/02/2016   BUN 11 01/02/2016   NA 136 01/02/2016   K 3.7 01/02/2016   CL 101 01/02/2016   CO2 29 01/02/2016    Anesthesia Physical Anesthesia Plan  ASA: II  Anesthesia Plan: General and Regional   Post-op Pain Management:    Induction: Intravenous  Airway Management Planned: LMA  Additional Equipment:   Intra-op Plan:   Post-operative Plan: Extubation in OR  Informed Consent: I have reviewed the patients History and Physical, chart, labs and discussed the procedure including the risks, benefits and alternatives for the proposed anesthesia with the patient or authorized representative who has indicated his/her understanding and acceptance.   Dental advisory given  Plan Discussed with:  CRNA  Anesthesia Plan Comments:         Anesthesia Quick Evaluation

## 2016-01-04 NOTE — Transfer of Care (Signed)
Immediate Anesthesia Transfer of Care Note  Patient: Julia Dunn  Procedure(s) Performed: Procedure(s): OPEN REDUCTION INTERNAL FIXATION (ORIF) LEFT BIMALLEOLAR ANKLE FRACTURE (Left)  Patient Location: PACU  Anesthesia Type:General  Level of Consciousness: awake, alert , oriented and patient cooperative  Airway & Oxygen Therapy: Patient Spontanous Breathing and Patient connected to face mask oxygen  Post-op Assessment: Report given to RN, Post -op Vital signs reviewed and stable and Patient moving all extremities X 4  Post vital signs: stable  Last Vitals:  Vitals:   01/04/16 1546  BP: (!) 146/87  Pulse: (!) 50  Resp: (!) 22  Temp: 36.7 C    Last Pain:  Vitals:   01/04/16 1601  TempSrc:   PainSc: 4       Patients Stated Pain Goal: 4 ([id number])  Complications: No apparent anesthesia complications

## 2016-01-04 NOTE — Anesthesia Procedure Notes (Signed)
Anesthesia Regional Block:  Popliteal block  Pre-Anesthetic Checklist: ,, timeout performed, Correct Patient, Correct Site, Correct Laterality, Correct Procedure, Correct Position, site marked, Risks and benefits discussed,  Surgical consent,  Pre-op evaluation,  At surgeon's request and post-op pain management  Laterality: Left  Prep: chloraprep       Needles:  Injection technique: Single-shot  Needle Type: Echogenic Needle     Needle Length: 9cm 9 cm Needle Gauge: 21 and 21 G    Additional Needles:  Procedures: ultrasound guided (picture in chart) and nerve stimulator Popliteal block  Nerve Stimulator or Paresthesia:  Response: tibial and peroneal, 0.5 mA,   Additional Responses:   Narrative:  Start time: 01/04/2016 5:10 PM End time: 01/04/2016 5:17 PM Injection made incrementally with aspirations every 5 mL.  Performed by: Personally  Anesthesiologist: Suzette Battiest

## 2016-01-04 NOTE — Anesthesia Procedure Notes (Addendum)
Anesthesia Regional Block:  Adductor canal block  Pre-Anesthetic Checklist: ,, timeout performed, Correct Patient, Correct Site, Correct Laterality, Correct Procedure, Correct Position, site marked, Risks and benefits discussed,  Surgical consent,  Pre-op evaluation,  At surgeon's request and post-op pain management  Laterality: Left  Prep: chloraprep       Needles:  Injection technique: Single-shot  Needle Type: Echogenic Needle     Needle Length: 9cm 9 cm Needle Gauge: 21 and 21 G    Additional Needles:  Procedures: ultrasound guided (picture in chart) Adductor canal block Narrative:  Start time: 01/04/2016 4:56 PM End time: 01/04/2016 5:03 PM Injection made incrementally with aspirations every 5 mL.  Performed by: Personally  Anesthesiologist: Suzette Battiest

## 2016-01-05 DIAGNOSIS — S82842A Displaced bimalleolar fracture of left lower leg, initial encounter for closed fracture: Secondary | ICD-10-CM | POA: Diagnosis not present

## 2016-01-05 LAB — GLUCOSE, CAPILLARY
Glucose-Capillary: 254 mg/dL — ABNORMAL HIGH (ref 65–99)
Glucose-Capillary: 217 mg/dL — ABNORMAL HIGH (ref 65–99)
Glucose-Capillary: 144 mg/dL — ABNORMAL HIGH (ref 65–99)
Glucose-Capillary: 197 mg/dL — ABNORMAL HIGH (ref 65–99)

## 2016-01-05 NOTE — Op Note (Signed)
NAMEANAYIA, OWSLEY                 ACCOUNT NO.:  [id number]  MEDICAL RECORD NO.:  [id number]  LOCATION:  [id number]                         FACILITY:  [facility]  PHYSICIAN:  Lind Guest. Ninfa Linden, M.D.DATE OF BIRTH:  January 31, 1971  DATE OF PROCEDURE:  01/04/2016 DATE OF DISCHARGE:                              OPERATIVE REPORT   PREOPERATIVE DIAGNOSIS:  Left unstable bimalleolar ankle fracture.  POSTOPERATIVE DIAGNOSIS:  Left unstable bimalleolar ankle fracture.  PROCEDURE:  Open reduction and internal fixation of left unstable ankle bimalleolar fracture.  IMPLANTS:  Stryker ankle solutions 4-hole distal fibular plate laterally and one 4.0 mm cancellous screw medially.  SURGEON:  Lind Guest. Ninfa Linden, M.D.  ASSISTANT:  Erskine Emery, PA-C.  ANESTHESIA: 1. Regional left lower extremity block. 2. General.  TOURNIQUET TIME:  Under 2 hours.  BLOOD LOSS:  Minimal.  COMPLICATIONS:  None.  INDICATIONS:  Ms. Maciejewski is a 44 year old diabetic female, who sustained an accidental mechanical fall last weekend in her yard.  She was seen in the emergency room and found to have an unstable bimalleolar ankle fracture.  Her ankle was not dislocated.  She was placed appropriately in a well-padded splint.  She was then referred to our office since were on call, and I saw her in the office this week.  We took her splint down and then she had definitely some swelling about her ankle and we showed her x-rays and explained to her the need for surgery based on this unstable fracture.  She is a diabetic, but under good control.  She works mainly from her computer at home.  We talked about the surgery and the risks and benefits involved.  She does understand the reasoning behind proceeding given the unstable nature of this fracture.  PROCEDURE DESCRIPTION:  After informed consent was obtained, appropriate left ankle was marked.  She was brought to the operating room after anesthesia was obtained, a  regional block in the holding room.  In the operating room, she was placed supine on the operating table and general anesthesia was then obtained.  A nonsterile tourniquet was placed around her upper left thigh.  Her left leg was prepped and draped from the knee down to the toes with DuraPrep and sterile drapes.  Time-out was called, and she was identified as correct patient, correct left ankle.  We then used the Esmarch to wrap out the leg and tourniquet was inflated to 300 mmHg of pressure.  We then made a lateral incision over the lateral aspect of the ankle and the fibula and dissected down the fracture.  We were able to easily clean the fracture debris and use reduction forceps to reduce the lateral malleolus fracture.  We then chose our Stryker 4- hole distal fibular plate and secured this to the lateral cortex of the fibula and then we were able to secure to the bone with bicortical screws proximally and locking unicortical screws distally.  This held the fracture in reduced position.  We then made a separate incision over the medial malleolus and dissected down the medial malleolus.  We were able to clean it from debris and irrigated out the ankle joint itself. We then held it  with reduction forceps and were able to place a single guide pin.  I liked the position of this guide pin, so we used a drill to drill the near cortex and placed a 4.0 mm 50 mm length partially- threaded cancellous screw in the medial malleolus holding it anatomically reduced.  We then stressed the ankle mortise under direct fluoroscopy and it stayed reduced.  We irrigated both medial and lateral wounds with normal saline solution.  We closed the deep tissue over the hardware with 0 Vicryl followed by 2-0 Vicryl in subcutaneous tissue, interrupted 2-0 nylon on the skin.  Xeroform and well-padded sterile dressing were applied.  We placed her in a well-padded plaster splint. The tourniquet was let down.  Her  toes pinking nicely.  She was awakened, extubated, and taken to recovery room in stable condition. All final counts were correct.  There were no complications noted. Postoperatively, will be admitted for overnight observation, so we can get her up with physical therapy and make sure she is compliant with nonweightbearing and instruction with safe use of a walker.  She will follow up in the office in 2 weeks.     Lind Guest. Ninfa Linden, M.D.     CYB/MEDQ  D:  01/04/2016  T:  01/05/2016  Job:  [id number]

## 2016-01-05 NOTE — Progress Notes (Signed)
Subjective: 1 Day Post-Op Procedure(s) (LRB): OPEN REDUCTION INTERNAL FIXATION (ORIF) LEFT BIMALLEOLAR ANKLE FRACTURE (Left) Patient reports pain as mild.  But block has not worn off yet.  Objective: Vital signs in last 24 hours: Temp:  [97.9 F (36.6 C)-98.7 F (37.1 C)] 98.6 F (37 C) (10/07 0530) Pulse Rate:  [50-95] 68 (10/07 0530) Resp:  [10-22] 16 (10/07 0530) BP: (112-146)/(62-91) 112/62 (10/07 0530) SpO2:  [96 %-100 %] 99 % (10/07 0530) Weight:  [105.5 kg (232 lb 9 oz)] 105.5 kg (232 lb 9 oz) (10/06 1546)  Intake/Output from previous day: 10/06 0701 - 10/07 0700 In: 2700.6 [P.O.:120; I.V.:2530.3; IV Piggyback:50.3] Out: 1350 [Urine:1300; Blood:50] Intake/Output this shift: Total I/O In: 120 [P.O.:120] Out: 800 [Urine:800]   Recent Labs  01/02/16 1225  HGB 12.7    Recent Labs  01/02/16 1225  WBC 6.6  RBC 4.32  HCT 38.0  PLT 211    Recent Labs  01/02/16 1225  NA 136  K 3.7  CL 101  CO2 29  BUN 11  CREATININE 0.92  GLUCOSE 161*  CALCIUM 9.0   No results for input(s): LABPT, INR in the last 72 hours.  Incision: dressing C/D/I Toes well perfused   Assessment/Plan: 1 Day Post-Op Procedure(s) (LRB): OPEN REDUCTION INTERNAL FIXATION (ORIF) LEFT BIMALLEOLAR ANKLE FRACTURE (Left) Up with therapy Plan for discharge tomorrow Discharge home with home health  Mcarthur Rossetti 01/05/2016, 11:14 AM

## 2016-01-05 NOTE — Progress Notes (Signed)
PT Cancellation Note  Patient Details Name: Julia Dunn MRN: [id number] DOB: 1970-10-10   Cancelled Treatment:    Reason Eval/Treat Not Completed: Other (comment); attempted PT--pt in bathroom  vomiting--unable to participate in PT at this time; will attempt again in am or as schedule permits   [facility] 01/05/2016, 2:54 PM

## 2016-01-05 NOTE — Progress Notes (Signed)
Patient arrived on the unit at approximately 2037. She is alert and verbally responsive. Left leg in soft splint, pulse palpable, and skin warm to touch. Due to nerve block given in OR, has no sensation to left foot and limited motor movement. No complaints voiced at this time. Will continue to monitor neurological status.

## 2016-01-06 DIAGNOSIS — S82842A Displaced bimalleolar fracture of left lower leg, initial encounter for closed fracture: Secondary | ICD-10-CM | POA: Diagnosis not present

## 2016-01-06 LAB — GLUCOSE, CAPILLARY
GLUCOSE-CAPILLARY: 263 mg/dL — AB (ref 65–99)
GLUCOSE-CAPILLARY: 189 mg/dL — AB (ref 65–99)

## 2016-01-06 MED ORDER — ASPIRIN 325 MG PO TABS: 325.0000 mg | ORAL_TABLET | Freq: Every day | ORAL | 0 refills | Status: DC

## 2016-01-06 MED ORDER — OXYCODONE-ACETAMINOPHEN 5-325 MG PO TABS: 1.0000 | ORAL_TABLET | ORAL | 0 refills | Status: DC | PRN

## 2016-01-06 NOTE — Progress Notes (Signed)
Pt given discharge instructions. Prescription x 1 given for 2 meds. No concerns voiced. PIV discontinued. Cath intact . Gauze and tape applied to site. Pt in room awaiting ride to be transported to home. Will cont to care for pt until ride arrives. VWilliams,rn.

## 2016-01-06 NOTE — Progress Notes (Signed)
Subjective: 2 Days Post-Op Procedure(s) (LRB): OPEN REDUCTION INTERNAL FIXATION (ORIF) LEFT BIMALLEOLAR ANKLE FRACTURE (Left) Patient reports pain as moderate.    Objective: Vital signs in last 24 hours: Temp:  [98.3 F (36.8 C)-98.6 F (37 C)] 98.6 F (37 C) (10/08 0700) Pulse Rate:  [66-69] 66 (10/08 0700) Resp:  [16] 16 (10/08 0700) BP: (113-153)/(69-74) 153/74 (10/08 0700) SpO2:  [96 %-99 %] 96 % (10/08 0700)  Intake/Output from previous day: 10/07 0701 - 10/08 0700 In: 840 [P.O.:840] Out: 5152 [Urine:4650; Emesis/NG output:500; Stool:2] Intake/Output this shift: Total I/O In: 240 [P.O.:240] Out: 600 [Urine:600]  No results for input(s): HGB in the last 72 hours. No results for input(s): WBC, RBC, HCT, PLT in the last 72 hours. No results for input(s): NA, K, CL, CO2, BUN, CREATININE, GLUCOSE, CALCIUM in the last 72 hours. No results for input(s): LABPT, INR in the last 72 hours.  Sensation intact distally Intact pulses distally Incision: dressing C/D/I Compartment soft  Assessment/Plan: 2 Days Post-Op Procedure(s) (LRB): OPEN REDUCTION INTERNAL FIXATION (ORIF) LEFT BIMALLEOLAR ANKLE FRACTURE (Left) Up with therapy Discharge home with home health today  Mcarthur Rossetti 01/06/2016, 9:16 AM

## 2016-01-06 NOTE — Discharge Summary (Signed)
Patient ID: Julia Dunn MRN: [id number] DOB/AGE: 45-12-1970 45 y.o.  Admit date: 01/04/2016 Discharge date: 01/06/2016  Admission Diagnoses:  Principal Problem:   Bimalleolar ankle fracture, left, closed, initial encounter Active Problems:   Closed displaced bimalleolar fracture of left ankle   Discharge Diagnoses:  Same  Past Medical History:  Diagnosis Date  . Anemia Dx 2013  . Asthma   . Bacterial vaginosis 2008  . Complication of anesthesia    pt states was informed had difficult time awakening following hysterectomy   . Depression   . Depression   . Diabetes mellitus Dx 2005  . Fibroids 09/05/2011   08/08/2011: Anteverted uterus. Diffuse fibroid involvement. Left of midline fibroid with submucosal component. 3.4cm x 2.8cmx 3.4cm.   Marland Kitchen GERD (gastroesophageal reflux disease)   . Gestational diabetes   . H/O shortness of breath    pt states has improved since lossing weight; triggers currently are enviromental pt states can make it to top of stairs w/o having to stop to catch her breath  . Headache(784.0)    migraines  . History of blood transfusion 2013/2014  . History of bronchitis   . Hx of blood clots   . Hypertension Dx 2015  . Kidney infection 2015  . Menorrhagia 09/27/2010  . Pneumonia   . Postpartum depression   . Pregnancy induced hypertension   . Trichomonas     Surgeries: Procedure(s): OPEN REDUCTION INTERNAL FIXATION (ORIF) LEFT BIMALLEOLAR ANKLE FRACTURE on 01/04/2016   Consultants:   Discharged Condition: Improved  Hospital Course: Julia Dunn is an 45 y.o. female who was admitted 01/04/2016 for operative treatment ofBimalleolar ankle fracture, left, closed, initial encounter. Patient has severe unremitting pain that affects sleep, daily activities, and work/hobbies. After pre-op clearance the patient was taken to the operating room on 01/04/2016 and underwent  Procedure(s): OPEN REDUCTION INTERNAL FIXATION (ORIF) LEFT BIMALLEOLAR ANKLE FRACTURE.     Patient was given perioperative antibiotics: Anti-infectives    Start     Dose/Rate Route Frequency Ordered Stop   01/05/16 0600  clindamycin (CLEOCIN) IVPB 900 mg     900 mg 100 mL/hr over 30 Minutes Intravenous On call to O.R. 01/04/16 1535 01/04/16 1802   01/04/16 2359  clindamycin (CLEOCIN) IVPB 600 mg     600 mg 100 mL/hr over 30 Minutes Intravenous Every 6 hours 01/04/16 2154 01/05/16 1301       Patient was given sequential compression devices, early ambulation, and chemoprophylaxis to prevent DVT.  Patient benefited maximally from hospital stay and there were no complications.    Recent vital signs: Patient Vitals for the past 24 hrs:  BP Temp Temp src Pulse Resp SpO2  01/06/16 0700 (!) 153/74 98.6 F (37 C) Oral 66 16 96 %  01/05/16 1343 138/74 98.6 F (37 C) Oral 66 16 99 %  01/05/16 0930 113/69 98.3 F (36.8 C) Oral 69 16 98 %     Recent laboratory studies: No results for input(s): WBC, HGB, HCT, PLT, NA, K, CL, CO2, BUN, CREATININE, GLUCOSE, INR, CALCIUM in the last 72 hours.  Invalid input(s): PT, 2   Discharge Medications:     Medication List    TAKE these medications   albuterol 108 (90 Base) MCG/ACT inhaler Commonly known as:  PROVENTIL HFA;VENTOLIN HFA Inhale 2 puffs into the lungs every 6 (six) hours as needed for wheezing or shortness of breath.   amLODipine 5 MG tablet Commonly known as:  NORVASC Take 1 tablet (5 mg total) by mouth daily.  aspirin 325 MG tablet Take 1 tablet (325 mg total) by mouth daily.   furosemide 20 MG tablet Commonly known as:  LASIX Take 1 tablet (20 mg total) by mouth daily. As needed for swelling   Insulin Pen Needle 31G X 8 MM Misc Commonly known as:  B-D ULTRAFINE III SHORT PEN 1 application by Does not apply route daily.   lisinopril-hydrochlorothiazide 20-12.5 MG tablet Commonly known as:  PRINZIDE,ZESTORETIC TAKE TWO TABLETS BY MOUTH ONCE DAILY   metFORMIN 1000 MG tablet Commonly known as:   GLUCOPHAGE Take 1 tablet (1,000 mg total) by mouth 2 (two) times daily with a meal.   naproxen sodium 220 MG tablet Commonly known as:  ANAPROX Take 440 mg by mouth daily as needed (pain).   ONE TOUCH ULTRA TEST test strip Generic drug:  glucose blood USE TO TEST DAILY BEFORE BREAKFAST   onetouch ultrasoft lancets 1 each by Other route 3 (three) times daily. E11.9   oxyCODONE-acetaminophen 5-325 MG tablet Commonly known as:  PERCOCET/ROXICET Take 1-2 tablets by mouth every 4 (four) hours as needed for severe pain. What changed:  how much to take       Diagnostic Studies: Dg Ankle Complete Left  Result Date: 01/04/2016 CLINICAL DATA:  Open reduction internal fixation of left ankle fracture EXAM: LEFT ANKLE COMPLETE - 3+ VIEW COMPARISON:  Ankle radiograph 12/29/2015 FINDINGS: Three fluoroscopic images obtained during open reduction and internal fixation of a left ankle fracture are provided. Total fluoroscopy time was reported as 56 seconds with a dose of 2 mGy. Screw and plate hardware is seen at the distal fibula, with a lag screw traversing the medial malleolus. IMPRESSION: Intraoperative images from open reduction and internal fixation of the left ankle. Electronically Signed   By: Ulyses Jarred M.D.   On: 01/04/2016 22:20   Dg Ankle Complete Left  Result Date: 12/29/2015 CLINICAL DATA:  45 y/o  F; status post fall with ankle injury. EXAM: LEFT ANKLE COMPLETE - 3+ VIEW COMPARISON:  None. FINDINGS: Moderately displaced medial malleolar a minimally displaced fibular fracture at the level of the tibial plafond. There is slight widening of the medial ankle mortise. There is a lucency in the medial aspect of the talar dome probably representing an osteochondral injury. Ankle joint effusion. Soft tissue swelling about the ankle. No other fractures identified. Plantar calcaneal enthesophytes. IMPRESSION: Bimalleolar fracture with slight widening of the ankle mortise and suspected osteochondral  injury of medial talar dome. Electronically Signed   By: Kristine Garbe M.D.   On: 12/29/2015 22:19   Dg C-arm 1-60 Min-no Report  Result Date: 01/04/2016 CLINICAL DATA: surgery C-ARM 1-60 MINUTES Fluoroscopy was utilized by the requesting physician.  No radiographic interpretation.    Disposition: 01-Home or Self Care  Discharge Instructions    Call MD / Call 911    Complete by:  As directed    If you experience chest pain or shortness of breath, CALL 911 and be transported to the hospital emergency room.  If you develope a fever above 101 F, pus (white drainage) or increased drainage or redness at the wound, or calf pain, call your surgeon's office.   Constipation Prevention    Complete by:  As directed    Drink plenty of fluids.  Prune juice may be helpful.  You may use a stool softener, such as Colace (over the counter) 100 mg twice a day.  Use MiraLax (over the counter) for constipation as needed.   Diet - low sodium  heart healthy    Complete by:  As directed    Discharge patient    Complete by:  As directed    Increase activity slowly as tolerated    Complete by:  As directed       Follow-up Information    Mcarthur Rossetti, MD Follow up in 2 week(s).   Specialty:  Orthopedic Surgery Contact information: [address] [place] [address] [phone]            Signed: Mcarthur Rossetti 01/06/2016, 9:19 AM

## 2016-01-06 NOTE — Discharge Instructions (Signed)
No weight on your left ankle. Keep your splint clean and dry. Elevation as needed.

## 2016-01-06 NOTE — Anesthesia Postprocedure Evaluation (Signed)
Anesthesia Post Note  Patient: Julia Dunn  Procedure(s) Performed: Procedure(s) (LRB): OPEN REDUCTION INTERNAL FIXATION (ORIF) LEFT BIMALLEOLAR ANKLE FRACTURE (Left)  Patient location during evaluation: PACU Anesthesia Type: General and Regional Level of consciousness: awake and alert Pain management: pain level controlled Vital Signs Assessment: post-procedure vital signs reviewed and stable Respiratory status: spontaneous breathing, nonlabored ventilation, respiratory function stable and patient connected to nasal cannula oxygen Cardiovascular status: blood pressure returned to baseline and stable Postop Assessment: no signs of nausea or vomiting Anesthetic complications: no    Last Vitals:  Vitals:   01/05/16 1343 01/06/16 0700  BP: 138/74 (!) 153/74  Pulse: 66 66  Resp: 16 16  Temp: 37 C 37 C    Last Pain:  Vitals:   01/06/16 0716  TempSrc:   PainSc: 10-Worst pain ever                 Tiajuana Amass

## 2016-01-06 NOTE — Evaluation (Signed)
Physical Therapy Evaluation Patient Details Name: Julia Dunn MRN: [id number] DOB: [birth year] Today's Date: 01/06/2016   History of Present Illness  s/p ORIF L ankle  Clinical Impression  Patient evaluated by Physical Therapy with no further acute PT needs identified. All education has been completed and the patient has no further questions.  See below for any follow-up Physical Therapy or equipment needs. PT is signing off. Thank you for this referral.  Pt did not want to amb but agreeable to OOB to chair; pt mod I with transfer; pt verbally described techniques for mobility/knee scooter/stair technique/amb with crutches or walker, etc-- that she was using at home prior to admission; she was not having any issues and does not feel she needs to practice here, does not feel she needs f/u HHPT at this time; agree with pt; RN notified     Follow Up Recommendations No PT follow up    Equipment Recommendations  None recommended by PT    Recommendations for Other Services       Precautions / Restrictions Restrictions LLE Weight Bearing: Non weight bearing      Mobility  Bed Mobility Overal bed mobility: Modified Independent                Transfers Overall transfer level: Modified independent Equipment used: None             General transfer comment: pt able to stand pivot to chair and maintian NWB without difficulty; pt declines further activity at this time; see note for furhter details  Ambulation/Gait             General Gait Details: NT/pt declined--has been amb to BR without difficulty  Stairs            Wheelchair Mobility    Modified Rankin (Stroke Patients Only)       Balance Overall balance assessment: Needs assistance Sitting-balance support: No upper extremity supported Sitting balance-Leahy Scale: Normal       Standing balance-Leahy Scale: Fair Standing balance comment: able to maintain static standing on RLE, NWB on LLE without  UE support adn then pivot o chair --no LOB                             Pertinent Vitals/Pain Pain Assessment: 0-10 Pain Score: 3  Pain Location: L anterior lower leg    Home Living Family/patient expects to be discharged to:: Private residence Living Arrangements: Children     Home Access: Stairs to enter Entrance Stairs-Rails: Right;Left;Can reach both Technical brewer of Steps: 2-3 Home Layout: One level Home Equipment: Environmental consultant - 2 wheels;Other (comment) (knee scooter)      Prior Function                 Hand Dominance        Extremity/Trunk Assessment   Upper Extremity Assessment: Overall WFL for tasks assessed           Lower Extremity Assessment: LLE deficits/detail   LLE Deficits / Details: knee and hip grossly WFL     Communication   Communication: No difficulties  Cognition Arousal/Alertness: Awake/alert Behavior During Therapy: WFL for tasks assessed/performed Overall Cognitive Status: Within Functional Limits for tasks assessed                      General Comments      Exercises     Assessment/Plan    PT Assessment Patent  does not need any further PT services (will need OPPT in a few wks per MD)  PT Problem List            PT Treatment Interventions      PT Goals (Current goals can be found in the Care Plan section)  Acute Rehab PT Goals PT Goal Formulation: All assessment and education complete, DC therapy    Frequency     Barriers to discharge        Co-evaluation               End of Session   Activity Tolerance: Patient tolerated treatment well Patient left: in chair;with call bell/phone within reach      Functional Assessment Tool Used: clinical judgement Functional Limitation: Mobility: Walking and moving around Mobility: Walking and Moving Around Current Status [id number]): At least 1 percent but less than 20 percent impaired, limited or restricted Mobility: Walking and Moving  Around Goal Status [phone]): At least 1 percent but less than 20 percent impaired, limited or restricted Mobility: Walking and Moving Around Discharge Status [phone]): At least 1 percent but less than 20 percent impaired, limited or restricted    Time: 1154-1210 PT Time Calculation (min) (ACUTE ONLY): 16 min   Charges:   PT Evaluation $PT Eval Low Complexity: 1 Procedure     PT G Codes:   PT G-Codes **NOT FOR INPATIENT CLASS** Functional Assessment Tool Used: clinical judgement Functional Limitation: Mobility: Walking and moving around Mobility: Walking and Moving Around Current Status [id number]): At least 1 percent but less than 20 percent impaired, limited or restricted Mobility: Walking and Moving Around Goal Status [phone]): At least 1 percent but less than 20 percent impaired, limited or restricted Mobility: Walking and Moving Around Discharge Status [phone]): At least 1 percent but less than 20 percent impaired, limited or restricted    [facility] 01/06/2016, 12:42 PM

## 2016-01-06 NOTE — Care Management Note (Signed)
Case Management Note  Patient Details  Name: Julia Dunn MRN: [id number] Date of Birth: 1970-10-11  Subjective/Objective:                  OPEN REDUCTION INTERNAL FIXATION (ORIF) LEFT BIMALLEOLAR ANKLE FRACTURE (Left)  Action/Plan: Cm spoke to patient who requested Advanced home care for [facility] PT. Cm called Jermaine with [facility] and referral accepted. Patient denied needing RW as she said that she already has one and has the RX for a knee scooter and her sister will pick up from [facility] tomorrow. She will go home with her teenage son and denied any further discharge needs.  Expected Discharge Date:  01/06/16               Expected Discharge Plan:  [place]  In-House Referral:     Discharge planning Services  CM Consult  Post Acute Care Choice:  Home Health Choice offered to:  Patient  DME Arranged:    DME Agency:     HH Arranged:  PT [place]:  [place]  Status of Service:  Completed, signed off  If discussed at [place] of Stay Meetings, dates discussed:    Additional Comments:  Guido Sander, RN 01/06/2016, 9:48 AM

## 2016-01-06 NOTE — Progress Notes (Signed)
Pt discharged to home. Left unit in wheelchair pushed by Vira Agar , NT accompanied by pt's father and young female family member. Left in good condition. VWilliams, rn.

## 2016-01-07 ENCOUNTER — Encounter (HOSPITAL_COMMUNITY): Payer: Self-pay | Admitting: Orthopaedic Surgery

## 2016-01-07 NOTE — Addendum Note (Signed)
Addendum  created 01/07/16 0753 by Lollie Sails, CRNA   Charge Capture section accepted

## 2016-01-18 ENCOUNTER — Ambulatory Visit (INDEPENDENT_AMBULATORY_CARE_PROVIDER_SITE_OTHER): Payer: 59 | Admitting: Family

## 2016-01-18 DIAGNOSIS — S82842D Displaced bimalleolar fracture of left lower leg, subsequent encounter for closed fracture with routine healing: Secondary | ICD-10-CM | POA: Diagnosis not present

## 2016-01-21 ENCOUNTER — Ambulatory Visit (INDEPENDENT_AMBULATORY_CARE_PROVIDER_SITE_OTHER): Payer: 59 | Admitting: Orthopaedic Surgery

## 2016-01-21 ENCOUNTER — Encounter (INDEPENDENT_AMBULATORY_CARE_PROVIDER_SITE_OTHER): Payer: Self-pay | Admitting: Orthopaedic Surgery

## 2016-01-21 DIAGNOSIS — S82842D Displaced bimalleolar fracture of left lower leg, subsequent encounter for closed fracture with routine healing: Secondary | ICD-10-CM

## 2016-01-21 MED ORDER — TIZANIDINE HCL 4 MG PO TABS: 4.0000 mg | ORAL_TABLET | Freq: Three times a day (TID) | ORAL | 0 refills | Status: DC | PRN

## 2016-01-21 MED ORDER — OXYCODONE-ACETAMINOPHEN 5-325 MG PO TABS: 1.0000 | ORAL_TABLET | Freq: Four times a day (QID) | ORAL | 0 refills | Status: DC | PRN

## 2016-01-21 NOTE — Progress Notes (Signed)
Office Visit Note   Patient: Julia Dunn           Date of Birth: May 14, 1970           MRN: [id number] Visit Date: 01/21/2016              Requested by: Boykin Nearing, MD [address] [place], [place] [address] PCP: Minerva Ends, MD   Assessment & Plan: Visit Diagnoses:  1. Closed traumatic displaced bimalleolar fracture of left ankle with routine healing, subsequent encounter     Plan: will remove her sutures today and place steri-strips; continue nNWB left ankle; can come out of cam walker for ankle ROM; follow-up in 3 weeks with repart 3 views of left ankle  Follow-Up Instructions: No Follow-up on file.   Orders:  No orders of the defined types were placed in this encounter.  Meds ordered this encounter  Medications  . tiZANidine (ZANAFLEX) 4 MG tablet    Sig: Take 1 tablet (4 mg total) by mouth every 8 (eight) hours as needed for muscle spasms.    Dispense:  40 tablet    Refill:  0  . oxyCODONE-acetaminophen (PERCOCET/ROXICET) 5-325 MG tablet    Sig: Take 1-2 tablets by mouth every 6 (six) hours as needed for severe pain.    Dispense:  60 tablet    Refill:  0      Procedures: No procedures performed   Clinical Data: No additional findings.   Subjective: Chief Complaint  Patient presents with  . Left Ankle - Routine Post Op    "I feel like I have nerve damage, still so much pain" Ambulates with crutches and cam boot.  post-op follow-up.  Some leg spasms.  HPI  Review of Systems   Objective: Vital Signs: LMP 06/29/2012   Physical Exam Left ankle incisions with intact sutures. Motor intact, but sensory decreased Calf soft Foot well perfused Gross sensation intact   Ortho Exam  Specialty Comments:  No specialty comments available.  Imaging: No results found.   PMFS History: Patient Active Problem List   Diagnosis Date Noted  . Bimalleolar ankle fracture, left, closed, initial encounter 01/04/2016  . Closed displaced  bimalleolar fracture of left ankle 01/04/2016  . SOB (shortness of breath) on exertion 08/31/2015  . Abnormal EKG 06/22/2014  . Abnormal CT scan of lung 06/22/2014  . Dyspnea 06/19/2014  . Microscopic hematuria 04/07/2014  . Onychomycosis 05/31/2013  . BI-RADS category 3 mammogram result 03/07/2013  . Vitamin D insufficiency 08/13/2011  . Morbid obesity ([place]) 08/10/2007  . ALLERGIC RHINITIS, SEASONAL 07/14/2006  . DM2 (diabetes mellitus, type 2) 05/28/2006  . DEPRESSIVE DISORDER, NOS 05/28/2006  . HTN (hypertension) 05/28/2006   Past Medical History:  Diagnosis Date  . Anemia Dx 2013  . Asthma   . Bacterial vaginosis 2008  . Complication of anesthesia    pt states was informed had difficult time awakening following hysterectomy   . Depression   . Depression   . Diabetes mellitus Dx 2005  . Fibroids 09/05/2011   08/08/2011: Anteverted uterus. Diffuse fibroid involvement. Left of midline fibroid with submucosal component. 3.4cm x 2.8cmx 3.4cm.   Marland Kitchen GERD (gastroesophageal reflux disease)   . Gestational diabetes   . H/O shortness of breath    pt states has improved since lossing weight; triggers currently are enviromental pt states can make it to top of stairs w/o having to stop to catch her breath  . Headache(784.0)    migraines  . History  of blood transfusion 2013/2014  . History of bronchitis   . Hx of blood clots   . Hypertension Dx 2015  . Kidney infection 2015  . Menorrhagia 09/27/2010  . Pneumonia   . Postpartum depression   . Pregnancy induced hypertension   . Trichomonas     Family History  Problem Relation Age of Onset  . Hypertension Brother   . Stroke Brother   . Sickle cell trait Daughter   . Hypertension Father   . Heart disease Mother   . Rheumatic fever Mother   . Hypertension Mother   . Stroke Mother   . Heart disease Paternal Aunt     Past Surgical History:  Procedure Laterality Date  . ABDOMINAL HYSTERECTOMY N/A 11/03/2012   Procedure: HYSTERECTOMY  ABDOMINAL;  Surgeon: Ena Dawley, MD;  Location: [facility];  Service: Gynecology;  Laterality: N/A;  . BILATERAL SALPINGECTOMY Bilateral 11/03/2012   Procedure: BILATERAL SALPINGECTOMY;  Surgeon: Ena Dawley, MD;  Location: [facility];  Service: Gynecology;  Laterality: Bilateral;  . BURCH PROCEDURE N/A 11/03/2012   Procedure: BURCH PROCEDURE;  Surgeon: Ena Dawley, MD;  Location: [facility];  Service: Gynecology;  Laterality: N/A;  . CYSTOSCOPY N/A 11/03/2012   Procedure: CYSTOSCOPY;  Surgeon: Ena Dawley, MD;  Location: [facility];  Service: Gynecology;  Laterality: N/A;  . DILATION AND CURETTAGE OF UTERUS    . LAPAROSCOPY N/A 11/03/2012   Procedure: LAPAROSCOPY DIAGNOSTIC;  Surgeon: Ena Dawley, MD;  Location: [facility];  Service: Gynecology;  Laterality: N/A;  . ORIF ANKLE FRACTURE Left 01/04/2016   Procedure: OPEN REDUCTION INTERNAL FIXATION (ORIF) LEFT BIMALLEOLAR ANKLE FRACTURE;  Surgeon: Mcarthur Rossetti, MD;  Location: [facility];  Service: Orthopedics;  Laterality: Left;  . TUBAL LIGATION    . WISDOM TOOTH EXTRACTION     3 teeth   Social History   Occupational History  . Not on file.   Social History Main Topics  . Smoking status: Never Smoker  . Smokeless tobacco: Never Used  . Alcohol use Yes     Comment: rarely  . Drug use: No  . Sexual activity: Not Currently    Birth control/ protection: Surgical     Comment: hysterectomy

## 2016-02-02 ENCOUNTER — Other Ambulatory Visit (INDEPENDENT_AMBULATORY_CARE_PROVIDER_SITE_OTHER): Payer: Self-pay | Admitting: Orthopaedic Surgery

## 2016-02-04 NOTE — Telephone Encounter (Signed)
Is she to continue?

## 2016-02-18 ENCOUNTER — Other Ambulatory Visit: Payer: Self-pay | Admitting: Family Medicine

## 2016-02-18 DIAGNOSIS — I10 Essential (primary) hypertension: Secondary | ICD-10-CM

## 2016-03-19 ENCOUNTER — Telehealth (INDEPENDENT_AMBULATORY_CARE_PROVIDER_SITE_OTHER): Payer: Self-pay | Admitting: Orthopaedic Surgery

## 2016-03-20 ENCOUNTER — Encounter (INDEPENDENT_AMBULATORY_CARE_PROVIDER_SITE_OTHER): Payer: Self-pay

## 2016-03-20 ENCOUNTER — Ambulatory Visit (INDEPENDENT_AMBULATORY_CARE_PROVIDER_SITE_OTHER): Payer: 59 | Admitting: Physician Assistant

## 2016-03-20 ENCOUNTER — Ambulatory Visit (INDEPENDENT_AMBULATORY_CARE_PROVIDER_SITE_OTHER): Payer: 59

## 2016-03-20 DIAGNOSIS — M25572 Pain in left ankle and joints of left foot: Secondary | ICD-10-CM

## 2016-03-20 MED ORDER — TRAMADOL HCL 50 MG PO TABS: 50.0000 mg | ORAL_TABLET | Freq: Four times a day (QID) | ORAL | 0 refills | Status: DC | PRN

## 2016-03-20 NOTE — Telephone Encounter (Signed)
Can we open up Wednesday 3rd  I can call her and schedule her after it's open

## 2016-03-20 NOTE — Progress Notes (Signed)
Office Visit Note   Patient: Julia Dunn           Date of Birth: [birth year]           MRN: [id number] Visit Date: 03/20/2016              Requested by: Boykin Nearing, MD [address] [place], [place] [address] PCP: Minerva Ends, MD   Assessment & Plan: Visit Diagnoses:  1. Pain in left ankle and joints of left foot     Plan: We will send her to physical therapy for range of motion strengthening of the left ankle. She is to discontinue the cam walker boot and go in an ASO brace. Physical therapy will wean her out of the ASO brace as proprioception and strength improve. Follow-up in 3 weeks' no x-rays at that time are needed.  Follow-Up Instructions: Return in about 3 weeks (around 04/10/2016) for post op.   Orders:  Orders Placed This Encounter  Procedures  . XR Ankle Complete Left   Meds ordered this encounter  Medications  . traMADol (ULTRAM) 50 MG tablet    Sig: Take 1 tablet (50 mg total) by mouth every 6 (six) hours as needed.    Dispense:  30 tablet    Refill:  0      Procedures: No procedures performed   Clinical Data: No additional findings.   Subjective: Chief Complaint  Patient presents with  . Left Ankle - Follow-up    HPI  Mrs. returns today now 11 weeks status post open reduction internal fixation of a bimalleolar ankle fracture. She comes in still in the [employer]. She states she did not receive a follow-up appointment and has been calling our office for weeks. She is having pain in the ankle and asking for pain medication.  Review of Systems   Objective: Vital Signs: LMP 06/29/2012   Physical Exam  Ortho Exam Surgical incisions are healing well no signs of infection left calf supple nontender. She has slightly limited dorsiflexion plantar flexion ankle. Dorsal pedal pulses present. Specialty Comments:  No specialty comments available.  Imaging: Xr Ankle Complete Left  Result Date: 03/20/2016 3 Views left ankle:  Fractures  are well-healed with good consolidation. Talus well located within the ankle mortise no diastases. No hardware failure. No other bony abnormalities    PMFS History: Patient Active Problem List   Diagnosis Date Noted  . Bimalleolar ankle fracture, left, closed, initial encounter 01/04/2016  . Closed displaced bimalleolar fracture of left ankle 01/04/2016  . SOB (shortness of breath) on exertion 08/31/2015  . Abnormal EKG 06/22/2014  . Abnormal CT scan of lung 06/22/2014  . Dyspnea 06/19/2014  . Microscopic hematuria 04/07/2014  . Onychomycosis 05/31/2013  . BI-RADS category 3 mammogram result 03/07/2013  . Vitamin D insufficiency 08/13/2011  . Morbid obesity ([place]) 08/10/2007  . ALLERGIC RHINITIS, SEASONAL 07/14/2006  . DM2 (diabetes mellitus, type 2) 05/28/2006  . DEPRESSIVE DISORDER, NOS 05/28/2006  . HTN (hypertension) 05/28/2006   Past Medical History:  Diagnosis Date  . Anemia Dx 2013  . Asthma   . Bacterial vaginosis 2008  . Complication of anesthesia    pt states was informed had difficult time awakening following hysterectomy   . Depression   . Depression   . Diabetes mellitus Dx 2005  . Fibroids 09/05/2011   08/08/2011: Anteverted uterus. Diffuse fibroid involvement. Left of midline fibroid with submucosal component. 3.4cm x 2.8cmx 3.4cm.   Marland Kitchen GERD (gastroesophageal reflux disease)   .  Gestational diabetes   . H/O shortness of breath    pt states has improved since lossing weight; triggers currently are enviromental pt states can make it to top of stairs w/o having to stop to catch her breath  . Headache(784.0)    migraines  . History of blood transfusion 2013/2014  . History of bronchitis   . Hx of blood clots   . Hypertension Dx 2015  . Kidney infection 2015  . Menorrhagia 09/27/2010  . Pneumonia   . Postpartum depression   . Pregnancy induced hypertension   . Trichomonas     Family History  Problem Relation Age of Onset  . Hypertension Brother     . Stroke Brother   . Sickle cell trait Daughter   . Hypertension Father   . Heart disease Mother   . Rheumatic fever Mother   . Hypertension Mother   . Stroke Mother   . Heart disease Paternal Aunt     Past Surgical History:  Procedure Laterality Date  . ABDOMINAL HYSTERECTOMY N/A 11/03/2012   Procedure: HYSTERECTOMY ABDOMINAL;  Surgeon: Ena Dawley, MD;  Location: [facility];  Service: Gynecology;  Laterality: N/A;  . BILATERAL SALPINGECTOMY Bilateral 11/03/2012   Procedure: BILATERAL SALPINGECTOMY;  Surgeon: Ena Dawley, MD;  Location: [facility];  Service: Gynecology;  Laterality: Bilateral;  . BURCH PROCEDURE N/A 11/03/2012   Procedure: BURCH PROCEDURE;  Surgeon: Ena Dawley, MD;  Location: [facility];  Service: Gynecology;  Laterality: N/A;  . CYSTOSCOPY N/A 11/03/2012   Procedure: CYSTOSCOPY;  Surgeon: Ena Dawley, MD;  Location: [facility];  Service: Gynecology;  Laterality: N/A;  . DILATION AND CURETTAGE OF UTERUS    . LAPAROSCOPY N/A 11/03/2012   Procedure: LAPAROSCOPY DIAGNOSTIC;  Surgeon: Ena Dawley, MD;  Location: [facility];  Service: Gynecology;  Laterality: N/A;  . ORIF ANKLE FRACTURE Left 01/04/2016   Procedure: OPEN REDUCTION INTERNAL FIXATION (ORIF) LEFT BIMALLEOLAR ANKLE FRACTURE;  Surgeon: Mcarthur Rossetti, MD;  Location: [facility];  Service: Orthopedics;  Laterality: Left;  . TUBAL LIGATION    . WISDOM TOOTH EXTRACTION     3 teeth   Social History   Occupational History  . Not on file.   Social History Main Topics  . Smoking status: Never Smoker  . Smokeless tobacco: Never Used  . Alcohol use Yes     Comment: rarely  . Drug use: No  . Sexual activity: Not Currently    Birth control/ protection: Surgical     Comment: hysterectomy

## 2016-04-09 ENCOUNTER — Telehealth: Payer: Self-pay | Admitting: Family Medicine

## 2016-04-09 DIAGNOSIS — I10 Essential (primary) hypertension: Secondary | ICD-10-CM

## 2016-04-09 NOTE — Telephone Encounter (Signed)
Will route to PCP 

## 2016-04-09 NOTE — Telephone Encounter (Signed)
Pt. Called requesting an appt. Pt. Was told that her PCP did not have any appt. Available and to call 04/14/16 to schedule an appt. Pt. States that she called on 03/25/16, 04/01/16 and today and was told the same thing. Pt. Requests to speak with PCP. [place] f/u

## 2016-04-10 ENCOUNTER — Ambulatory Visit (INDEPENDENT_AMBULATORY_CARE_PROVIDER_SITE_OTHER): Payer: 59 | Admitting: Physician Assistant

## 2016-04-11 MED ORDER — AMLODIPINE BESYLATE 5 MG PO TABS: 5.0000 mg | ORAL_TABLET | Freq: Every day | ORAL | 2 refills | Status: DC

## 2016-04-11 NOTE — Telephone Encounter (Signed)
Called patient Verified name and DOB She just needs norvasc refill Hard time scheduling appt BP Readings from Last 3 Encounters:  01/06/16 (!) 154/83  01/02/16 123/74  12/29/15 155/82    Refilled norvasc  Asked her to f/u as soon as possible She agreed

## 2016-04-28 ENCOUNTER — Other Ambulatory Visit: Payer: Self-pay | Admitting: Family Medicine

## 2016-05-19 ENCOUNTER — Ambulatory Visit: Payer: 59 | Attending: Family Medicine | Admitting: Family Medicine

## 2016-05-19 ENCOUNTER — Encounter: Payer: Self-pay | Admitting: Family Medicine

## 2016-05-19 VITALS — BP 128/78 | HR 59 | Temp 98.0°F | Ht 65.0 in | Wt 238.8 lb

## 2016-05-19 DIAGNOSIS — Z79899 Other long term (current) drug therapy: Secondary | ICD-10-CM | POA: Insufficient documentation

## 2016-05-19 DIAGNOSIS — E1169 Type 2 diabetes mellitus with other specified complication: Secondary | ICD-10-CM | POA: Diagnosis not present

## 2016-05-19 DIAGNOSIS — S82842A Displaced bimalleolar fracture of left lower leg, initial encounter for closed fracture: Secondary | ICD-10-CM

## 2016-05-19 DIAGNOSIS — I10 Essential (primary) hypertension: Secondary | ICD-10-CM | POA: Insufficient documentation

## 2016-05-19 DIAGNOSIS — E119 Type 2 diabetes mellitus without complications: Secondary | ICD-10-CM | POA: Diagnosis present

## 2016-05-19 DIAGNOSIS — E118 Type 2 diabetes mellitus with unspecified complications: Secondary | ICD-10-CM | POA: Diagnosis not present

## 2016-05-19 DIAGNOSIS — S82842D Displaced bimalleolar fracture of left lower leg, subsequent encounter for closed fracture with routine healing: Secondary | ICD-10-CM | POA: Insufficient documentation

## 2016-05-19 DIAGNOSIS — Z7984 Long term (current) use of oral hypoglycemic drugs: Secondary | ICD-10-CM | POA: Diagnosis not present

## 2016-05-19 DIAGNOSIS — X58XXXD Exposure to other specified factors, subsequent encounter: Secondary | ICD-10-CM | POA: Diagnosis not present

## 2016-05-19 LAB — GLUCOSE, POCT (MANUAL RESULT ENTRY): POC Glucose: 160 mg/dl — AB (ref 70–99)

## 2016-05-19 MED ORDER — TRAMADOL HCL 50 MG PO TABS: 50.0000 mg | ORAL_TABLET | Freq: Four times a day (QID) | ORAL | 0 refills | Status: DC | PRN

## 2016-05-19 MED ORDER — TIZANIDINE HCL 4 MG PO TABS: 4.0000 mg | ORAL_TABLET | Freq: Three times a day (TID) | ORAL | 0 refills | Status: DC | PRN

## 2016-05-19 MED ORDER — AMLODIPINE BESYLATE 5 MG PO TABS: 5.0000 mg | ORAL_TABLET | Freq: Every day | ORAL | 3 refills | Status: DC

## 2016-05-19 MED ORDER — LISINOPRIL-HYDROCHLOROTHIAZIDE 20-12.5 MG PO TABS: ORAL_TABLET | ORAL | 3 refills | Status: DC

## 2016-05-19 MED ORDER — METFORMIN HCL 1000 MG PO TABS: 1000.0000 mg | ORAL_TABLET | Freq: Every day | ORAL | 3 refills | Status: DC

## 2016-05-19 NOTE — Patient Instructions (Addendum)
Vonzella was seen today for diabetes.  Diagnoses and all orders for this visit:  Type 2 diabetes mellitus with other specified complication, without long-term current use of insulin (HCC) -     POCT glucose (manual entry) -     metFORMIN (GLUCOPHAGE) 1000 MG tablet; Take 1 tablet (1,000 mg total) by mouth daily with breakfast.  Essential hypertension -     lisinopril-hydrochlorothiazide (PRINZIDE,ZESTORETIC) 20-12.5 MG tablet; TAKE TWO TABLETS BY MOUTH ONCE DAILY -     amLODipine (NORVASC) 5 MG tablet; Take 1 tablet (5 mg total) by mouth daily. -     metFORMIN (GLUCOPHAGE) 1000 MG tablet; Take 1 tablet (1,000 mg total) by mouth daily with breakfast.  Closed displaced bimalleolar fracture of left ankle, initial encounter -     Ambulatory referral to Physical Therapy -     tiZANidine (ZANAFLEX) 4 MG tablet; Take 1 tablet (4 mg total) by mouth every 8 (eight) hours as needed for muscle spasms. -     traMADol (ULTRAM) 50 MG tablet; Take 1 tablet (50 mg total) by mouth every 6 (six) hours as needed.  PT referral for left ankle  F/u in 8 weeks for left ankle pain   Dr. Adrian Blackwater

## 2016-05-19 NOTE — Assessment & Plan Note (Signed)
Controlled. Continue current regimen. 

## 2016-05-19 NOTE — Assessment & Plan Note (Signed)
PT referral placed Refilled tramadol and zanaflex

## 2016-05-19 NOTE — Progress Notes (Signed)
Subjective:  Patient ID: Julia Dunn, female    DOB: 04/05/1970  Age: 46 y.o. MRN: [id number]  CC: Diabetes   HPI Julia Dunn presents for    1. CHRONIC DIABETES  Disease Monitoring  Blood Sugar Ranges: 116-200   Polyuria: no   Visual problems: no   Medication Compliance: yes  edication Side Effects  Hypoglycemia: no   Preventitive Health Care  Eye Exam: due   Foot Exam: done today   Diet pattern: eating 5 small meals a day   Exercise: daily, working with a trainer    2. CHRONIC HYPERTENSION  Disease Monitoring  Blood pressure range: not checking   Chest pain: no   Dyspnea: yes with exercise   Claudication: no   Medication compliance: yes  Medication Side Effects  Lightheadedness: no   Urinary frequency: no   Edema: no   Impotence: no   Preventitive Healthcare:  Exercise: yes    3. L ankle pain: patient is 4 months status post open reduction internal fixation of a bimalleolar ankle fracture She was followed by ortho. She has not completed recommended PT due to expense of co-pay ($60/visit). She still has swelling, poor ROM and stiffness. She takes ibuprofen for pain. No tramadol or Zanaflex.    Social History  Substance Use Topics  . Smoking status: Never Smoker  . Smokeless tobacco: Never Used  . Alcohol use Yes     Comment: rarely    Outpatient Medications Prior to Visit  Medication Sig Dispense Refill  . albuterol (PROVENTIL HFA;VENTOLIN HFA) 108 (90 Base) MCG/ACT inhaler Inhale 2 puffs into the lungs every 6 (six) hours as needed for wheezing or shortness of breath. 1 Inhaler 0  . amLODipine (NORVASC) 5 MG tablet Take 1 tablet (5 mg total) by mouth daily. 30 tablet 2  . lisinopril-hydrochlorothiazide (PRINZIDE,ZESTORETIC) 20-12.5 MG tablet TAKE TWO TABLETS BY MOUTH ONCE DAILY **MUST  HAVE  OFFICE  VISIT  FOR  REFILLS** 60 tablet 0  . metFORMIN (GLUCOPHAGE) 1000 MG tablet Take 1 tablet (1,000 mg total) by mouth 2 (two) times daily with a meal. 30  tablet 5  . aspirin 325 MG tablet Take 1 tablet (325 mg total) by mouth daily. (Patient not taking: Reported on 05/19/2016) 30 tablet 0  . aspirin EC 325 MG tablet TAKE 1 TABLET BY MOUTH EVERY DAY (Patient not taking: Reported on 05/19/2016) 30 tablet 0  . furosemide (LASIX) 20 MG tablet TAKE ONE TABLET BY MOUTH ONCE DAILY AS NEEDED FOR SWELLING. (Patient not taking: Reported on 05/19/2016) 30 tablet 0  . Insulin Pen Needle (B-D ULTRAFINE III SHORT PEN) 31G X 8 MM MISC 1 application by Does not apply route daily. (Patient not taking: Reported on 01/02/2016) 30 each 11  . Lancets (ONETOUCH ULTRASOFT) lancets 1 each by Other route 3 (three) times daily. E11.9 (Patient not taking: Reported on 01/02/2016) 100 each 12  . naproxen sodium (ANAPROX) 220 MG tablet Take 440 mg by mouth daily as needed (pain).    . ONE TOUCH ULTRA TEST test strip USE TO TEST DAILY BEFORE BREAKFAST (Patient not taking: Reported on 01/02/2016) 100 each 12  . oxyCODONE-acetaminophen (PERCOCET/ROXICET) 5-325 MG tablet Take 1-2 tablets by mouth every 6 (six) hours as needed for severe pain. (Patient not taking: Reported on 05/19/2016) 60 tablet 0  . tiZANidine (ZANAFLEX) 4 MG tablet Take 1 tablet (4 mg total) by mouth every 8 (eight) hours as needed for muscle spasms. (Patient not taking: Reported on 05/19/2016) 40 tablet  0  . traMADol (ULTRAM) 50 MG tablet Take 1 tablet (50 mg total) by mouth every 6 (six) hours as needed. (Patient not taking: Reported on 05/19/2016) 30 tablet 0   No facility-administered medications prior to visit.     ROS Review of Systems  Constitutional: Negative for chills and fever.  Eyes: Negative for visual disturbance.  Respiratory: Negative for shortness of breath.   Cardiovascular: Negative for chest pain.  Gastrointestinal: Negative for abdominal pain and blood in stool.  Musculoskeletal: Positive for arthralgias, gait problem and joint swelling. Negative for back pain.  Skin: Negative for rash.    Allergic/Immunologic: Negative for immunocompromised state.  Hematological: Negative for adenopathy. Does not bruise/bleed easily.  Psychiatric/Behavioral: Negative for dysphoric mood and suicidal ideas.    Objective:  BP 128/78 (BP Location: Left Arm, Patient Position: Sitting, Cuff Size: Large)   Pulse (!) 59   Temp 98 F (36.7 C) (Oral)   Ht 5\' 5"  (1.651 m)   Wt 238 lb 12.8 oz (108.3 kg)   LMP 06/29/2012   SpO2 97%   BMI 39.74 kg/m   BP/Weight 05/19/2016 01/06/2016 [id number]  Systolic BP 0000000 [id number] -  Diastolic BP 78 83 -  Wt. (Lbs) 238.8 - 232.56  BMI 39.74 - 38.7   Wt Readings from Last 3 Encounters:  05/19/16 238 lb 12.8 oz (108.3 kg)  01/04/16 232 lb 9 oz (105.5 kg)  01/02/16 232 lb 9 oz (105.5 kg)     Physical Exam  Constitutional: She is oriented to person, place, and time. She appears well-developed and well-nourished. No distress.  Obese   HENT:  Head: Normocephalic and atraumatic.  Cardiovascular: Normal rate, regular rhythm, normal heart sounds and intact distal pulses.   Pulmonary/Chest: Effort normal and breath sounds normal.  Musculoskeletal: She exhibits no edema.       Feet:  Neurological: She is alert and oriented to person, place, and time.  Skin: Skin is warm and dry. No rash noted.  Psychiatric: She has a normal mood and affect.   Lab Results  Component Value Date   HGBA1C 6.9 (H) 01/02/2016   CBG 165  Lab Results  Component Value Date   HGBA1C 6.9 (H) 01/02/2016    Assessment & Plan:  Julia Dunn was seen today for diabetes.  Diagnoses and all orders for this visit:  Type 2 diabetes mellitus with other specified complication, without long-term current use of insulin (HCC) -     POCT glucose (manual entry) -     metFORMIN (GLUCOPHAGE) 1000 MG tablet; Take 1 tablet (1,000 mg total) by mouth daily with breakfast.  Essential hypertension -     lisinopril-hydrochlorothiazide (PRINZIDE,ZESTORETIC) 20-12.5 MG tablet; TAKE TWO TABLETS BY MOUTH  ONCE DAILY -     amLODipine (NORVASC) 5 MG tablet; Take 1 tablet (5 mg total) by mouth daily. -     metFORMIN (GLUCOPHAGE) 1000 MG tablet; Take 1 tablet (1,000 mg total) by mouth daily with breakfast.  Closed displaced bimalleolar fracture of left ankle, initial encounter -     Ambulatory referral to Physical Therapy -     tiZANidine (ZANAFLEX) 4 MG tablet; Take 1 tablet (4 mg total) by mouth every 8 (eight) hours as needed for muscle spasms. -     traMADol (ULTRAM) 50 MG tablet; Take 1 tablet (50 mg total) by mouth every 6 (six) hours as needed.    No orders of the defined types were placed in this encounter.   Follow-up: Return in about 8 weeks (  around 07/14/2016) for ankle pain .   Boykin Nearing MD

## 2016-07-21 ENCOUNTER — Ambulatory Visit: Payer: 59 | Admitting: Family Medicine

## 2016-08-13 ENCOUNTER — Encounter: Payer: Self-pay | Admitting: Family Medicine

## 2016-10-21 ENCOUNTER — Telehealth: Payer: Self-pay | Admitting: Family Medicine

## 2016-10-21 DIAGNOSIS — I10 Essential (primary) hypertension: Secondary | ICD-10-CM

## 2016-10-21 DIAGNOSIS — E1169 Type 2 diabetes mellitus with other specified complication: Secondary | ICD-10-CM

## 2016-10-21 MED ORDER — METFORMIN HCL 1000 MG PO TABS: 1000.0000 mg | ORAL_TABLET | Freq: Two times a day (BID) | ORAL | 3 refills | Status: DC

## 2016-10-21 NOTE — Telephone Encounter (Signed)
Patient overdue for diabetes follow up Advised to schedule an appointment Also advised to increase metformin to 1000 mg twice daily

## 2016-10-21 NOTE — Telephone Encounter (Signed)
Pt calling stating that sugar levels have been over 250 for 3 weeks, and over 350 for this week. Requests a call from PCP. Please f/u. Thank you.

## 2016-10-22 ENCOUNTER — Other Ambulatory Visit: Payer: Self-pay | Admitting: Family Medicine

## 2016-10-22 NOTE — Telephone Encounter (Signed)
Pt was called and a VM was left informing pt to increase medication.

## 2017-08-01 ENCOUNTER — Encounter (HOSPITAL_COMMUNITY): Payer: Self-pay

## 2017-08-01 ENCOUNTER — Other Ambulatory Visit: Payer: Self-pay

## 2017-08-01 ENCOUNTER — Emergency Department (HOSPITAL_COMMUNITY)
Admission: EM | Admit: 2017-08-01 | Discharge: 2017-08-01 | Disposition: A | Discharge status: Home / Self Care | Payer: 59 | Attending: Emergency Medicine | Admitting: Emergency Medicine

## 2017-08-01 DIAGNOSIS — I10 Essential (primary) hypertension: Secondary | ICD-10-CM | POA: Diagnosis not present

## 2017-08-01 DIAGNOSIS — E119 Type 2 diabetes mellitus without complications: Secondary | ICD-10-CM | POA: Diagnosis not present

## 2017-08-01 DIAGNOSIS — T7840XA Allergy, unspecified, initial encounter: Secondary | ICD-10-CM | POA: Diagnosis not present

## 2017-08-01 DIAGNOSIS — Z7984 Long term (current) use of oral hypoglycemic drugs: Secondary | ICD-10-CM | POA: Diagnosis not present

## 2017-08-01 DIAGNOSIS — J45909 Unspecified asthma, uncomplicated: Secondary | ICD-10-CM | POA: Insufficient documentation

## 2017-08-01 DIAGNOSIS — Z79899 Other long term (current) drug therapy: Secondary | ICD-10-CM | POA: Diagnosis not present

## 2017-08-01 DIAGNOSIS — H5789 Other specified disorders of eye and adnexa: Secondary | ICD-10-CM

## 2017-08-01 DIAGNOSIS — L299 Pruritus, unspecified: Secondary | ICD-10-CM | POA: Insufficient documentation

## 2017-08-01 DIAGNOSIS — R0602 Shortness of breath: Secondary | ICD-10-CM | POA: Diagnosis not present

## 2017-08-01 DIAGNOSIS — H02846 Edema of left eye, unspecified eyelid: Secondary | ICD-10-CM | POA: Diagnosis not present

## 2017-08-01 DIAGNOSIS — R21 Rash and other nonspecific skin eruption: Secondary | ICD-10-CM | POA: Diagnosis present

## 2017-08-01 MED ORDER — PREDNISONE 20 MG PO TABS
60.0000 mg | ORAL_TABLET | Freq: Once | ORAL | Status: AC
  Administered 2017-08-01: 60 mg via ORAL
  Filled 2017-08-01: qty 3

## 2017-08-01 MED ORDER — PREDNISONE 20 MG PO TABS: 20.0000 mg | ORAL_TABLET | Freq: Every day | ORAL | 0 refills | Status: DC

## 2017-08-01 NOTE — ED Triage Notes (Signed)
Pt reports that she has had facial swelling and itching since Tuesday. Her R eye is swollen shut. She reports that she has been using benadryl and hydrocortisone cream with some relief, but the swelling and itchiness persists. She states that she is starting to feel SOB. She is speaking in complete sentences. NAD. Deneis N/V. Does not know of any allergies.

## 2017-08-01 NOTE — ED Provider Notes (Signed)
[facility] DEPT Provider Note   CSN: [id number] Arrival date & time: 08/01/17  2058     History   Chief Complaint Chief Complaint  Patient presents with  . Facial Swelling    HPI Julia Dunn is a 47 y.o. female who presents with right eyelid swelling and itching. PMH significant for non-insulin dependent DM, depression, GERD, headaches. She states that her symptoms started 4 days ago. It started with right eye swelling and a rash around the eye. It's been very itchy. She's been applying Benadryl cream and hydrocortisone cream which improves the itching but her symptoms aren't going away. She felt a little SOB today which is part of the reason why she came to the ED. This is better now. She is unsure of any allergies. She's never had a reaction like this before. She was working in her mother's yard last weekend and is unsure of any bites or stings or contact with poison ivy. She denies new soaps, lotions, detergents. No wheezing, vomiting, syncope.  HPI  Past Medical History:  Diagnosis Date  . Anemia Dx 2013  . Asthma   . Bacterial vaginosis 2008  . Complication of anesthesia    pt states was informed had difficult time awakening following hysterectomy   . Depression   . Depression   . Diabetes mellitus Dx 2005  . Fibroids 09/05/2011   08/08/2011: Anteverted uterus. Diffuse fibroid involvement. Left of midline fibroid with submucosal component. 3.4cm x 2.8cmx 3.4cm.   Marland Kitchen GERD (gastroesophageal reflux disease)   . Gestational diabetes   . H/O shortness of breath    pt states has improved since lossing weight; triggers currently are enviromental pt states can make it to top of stairs w/o having to stop to catch her breath  . Headache(784.0)    migraines  . History of blood transfusion 2013/2014  . History of bronchitis   . Hx of blood clots   . Hypertension Dx 2015  . Kidney infection 2015  . Menorrhagia 09/27/2010  . Pneumonia   . Postpartum  depression   . Pregnancy induced hypertension   . Trichomonas     Patient Active Problem List   Diagnosis Date Noted  . Closed displaced bimalleolar fracture of left ankle 01/04/2016  . Abnormal EKG 06/22/2014  . Abnormal CT scan of lung 06/22/2014  . Dyspnea 06/19/2014  . Microscopic hematuria 04/07/2014  . Onychomycosis 05/31/2013  . BI-RADS category 3 mammogram result 03/07/2013  . Vitamin D insufficiency 08/13/2011  . Morbid obesity ([place]) 08/10/2007  . ALLERGIC RHINITIS, SEASONAL 07/14/2006  . DM2 (diabetes mellitus, type 2) 05/28/2006  . DEPRESSIVE DISORDER, NOS 05/28/2006  . HTN (hypertension) 05/28/2006    Past Surgical History:  Procedure Laterality Date  . ABDOMINAL HYSTERECTOMY N/A 11/03/2012   Procedure: HYSTERECTOMY ABDOMINAL;  Surgeon: Ena Dawley, MD;  Location: [facility];  Service: Gynecology;  Laterality: N/A;  . BILATERAL SALPINGECTOMY Bilateral 11/03/2012   Procedure: BILATERAL SALPINGECTOMY;  Surgeon: Ena Dawley, MD;  Location: [facility];  Service: Gynecology;  Laterality: Bilateral;  . BURCH PROCEDURE N/A 11/03/2012   Procedure: BURCH PROCEDURE;  Surgeon: Ena Dawley, MD;  Location: [facility];  Service: Gynecology;  Laterality: N/A;  . CYSTOSCOPY N/A 11/03/2012   Procedure: CYSTOSCOPY;  Surgeon: Ena Dawley, MD;  Location: [facility];  Service: Gynecology;  Laterality: N/A;  . DILATION AND CURETTAGE OF UTERUS    . LAPAROSCOPY N/A 11/03/2012   Procedure: LAPAROSCOPY DIAGNOSTIC;  Surgeon: Ena Dawley, MD;  Location: [facility];  Service: Gynecology;  Laterality: N/A;  . ORIF ANKLE FRACTURE Left 01/04/2016   Procedure: OPEN REDUCTION INTERNAL FIXATION (ORIF) LEFT BIMALLEOLAR ANKLE FRACTURE;  Surgeon: Mcarthur Rossetti, MD;  Location: [facility];  Service: Orthopedics;  Laterality: Left;  . TUBAL LIGATION    . WISDOM TOOTH EXTRACTION     3 teeth     OB History    Gravida  6   Para  3   Term  3   Preterm      AB  3   Living  3     SAB      TAB  3    Ectopic      Multiple      Live Births  3            Home Medications    Prior to Admission medications   Medication Sig Start Date End Date Taking? Authorizing Provider  albuterol (PROVENTIL HFA;VENTOLIN HFA) 108 (90 Base) MCG/ACT inhaler Inhale 2 puffs into the lungs every 6 (six) hours as needed for wheezing or shortness of breath. 08/31/15   Funches, Adriana Mccallum, MD  amLODipine (NORVASC) 5 MG tablet Take 1 tablet (5 mg total) by mouth daily. 05/19/16   Funches, Adriana Mccallum, MD  lisinopril-hydrochlorothiazide (PRINZIDE,ZESTORETIC) 20-12.5 MG tablet TAKE TWO TABLETS BY MOUTH ONCE DAILY 05/19/16   Boykin Nearing, MD  metFORMIN (GLUCOPHAGE) 1000 MG tablet Take 1 tablet (1,000 mg total) by mouth 2 (two) times daily with a meal. 10/21/16   Funches, Josalyn, MD  naproxen sodium (ANAPROX) 220 MG tablet Take 440 mg by mouth daily as needed (pain).    [provider]  ONE TOUCH ULTRA TEST test strip USE  STRIP TO CHECK GLUCOSE THREE TIMES DAILY BEFORE MEAL(S) 10/23/16   Charlott Rakes, MD  predniSONE (DELTASONE) 20 MG tablet Take 1 tablet (20 mg total) by mouth daily. 08/01/17   Recardo Evangelist, PA-C  tiZANidine (ZANAFLEX) 4 MG tablet Take 1 tablet (4 mg total) by mouth every 8 (eight) hours as needed for muscle spasms. 05/19/16   Funches, Adriana Mccallum, MD  traMADol (ULTRAM) 50 MG tablet Take 1 tablet (50 mg total) by mouth every 6 (six) hours as needed. 05/19/16   Boykin Nearing, MD    Family History Family History  Problem Relation Age of Onset  . Hypertension Brother   . Stroke Brother   . Sickle cell trait Daughter   . Hypertension Father   . Heart disease Mother   . Rheumatic fever Mother   . Hypertension Mother   . Stroke Mother   . Heart disease Paternal Aunt     Social History Social History   Tobacco Use  . Smoking status: Never Smoker  . Smokeless tobacco: Never Used  Substance Use Topics  . Alcohol use: Yes    Comment: rarely  . Drug use: No     Allergies   Other  and Penicillins   Review of Systems Review of Systems  Eyes: Positive for itching. Negative for pain.       +swelling around right eye  Respiratory: Positive for shortness of breath (resolved). Negative for wheezing.   Cardiovascular: Negative for chest pain.  Gastrointestinal: Negative for abdominal pain, nausea and vomiting.  Skin: Positive for rash.  All other systems reviewed and are negative.    Physical Exam Updated Vital Signs BP 130/84   Pulse 89   Temp 98.6 F (37 C) (Oral)   Resp (!) 22   Ht 5\' 5"  (1.651 m)  Wt 100.5 kg (221 lb 8 oz)   LMP 06/29/2012   SpO2 95%   BMI 36.86 kg/m   Physical Exam  Constitutional: She is oriented to person, place, and time. She appears well-developed and well-nourished. No distress.  Obese, calm, cooperative. Speaks clearly in full sentences  HENT:  Head: Normocephalic and atraumatic.  Eyes: Pupils are equal, round, and reactive to light. Conjunctivae and EOM are normal. Right eye exhibits no discharge. Left eye exhibits no discharge. No scleral icterus.  Periorbital edema and erythema  Neck: Normal range of motion.  Cardiovascular: Normal rate and regular rhythm.  Pulmonary/Chest: Effort normal and breath sounds normal. No respiratory distress.  Abdominal: She exhibits no distension.  Neurological: She is alert and oriented to person, place, and time.  Skin: Skin is warm and dry.  Psychiatric: She has a normal mood and affect. Her behavior is normal.  Nursing note and vitals reviewed.    ED Treatments / Results  Labs (all labs ordered are listed, but only abnormal results are displayed) Labs Reviewed - No data to display  EKG None  Radiology No results found.  Procedures Procedures (including critical care time)  Medications Ordered in ED Medications  predniSONE (DELTASONE) tablet 60 mg (60 mg Oral Given 08/01/17 2258)     Initial Impression / Assessment and Plan / ED Course  I have reviewed the triage vital  signs and the nursing notes.  Pertinent labs & imaging results that were available during my care of the patient were reviewed by me and considered in my medical decision making (see chart for details).  47 year old female with periorbital right eye swelling, rash and itching for the past 4 days.  Her vital signs are normal.  Extraocular motions are intact.  Symptoms are consistent with an allergic reaction of unknown etiology.  Less likely infection - she does not describe any pain and she is afebrile.  She mentioned shortness of breath in triage however her oxygen saturation is normal and her lungs are clear to auscultation. She is requesting stronger than what she's already been taking because she feels the symptoms are not improving fast enough. She is a diabetic. Discussed risk vs benefit of oral steroids. She understand that she will need close monitoring of her glucose and follow up with her doctor. She was advised to continue cool compresses, benadryl, hydrocortisone.   Final Clinical Impressions(s) / ED Diagnoses   Final diagnoses:  Allergic reaction, initial encounter  Eye swelling, left    ED Discharge Orders        Ordered    predniSONE (DELTASONE) 20 MG tablet  Daily     08/01/17 2311       Recardo Evangelist, PA-C 08/01/17 2355    Virgel Manifold, MD 08/04/17 1108

## 2017-08-01 NOTE — Discharge Instructions (Signed)
Take Prednisone for the next 5 days Drink plenty of fluids and follow diabetic diet Check your blood sugar and please follow up with your doctor Continue Benadryl and hydrocortisone cream Continue to apply cool compresses to eye

## 2017-08-01 NOTE — ED Notes (Signed)
No respiratory or acute distress noted alert and oriented x 3 call light in reach. 

## 2017-11-08 IMAGING — RF DG ANKLE COMPLETE 3+V*L*
1 series · 3 of 3 positions shown · non-contrast
Comparison: Ankle radiograph 12/29/2015

CLINICAL DATA: Open reduction internal fixation of left ankle
fracture

EXAM:
LEFT ANKLE COMPLETE - 3+ VIEW

[Series 1: run · 3 of 3 slices shown]
[im 1/3]
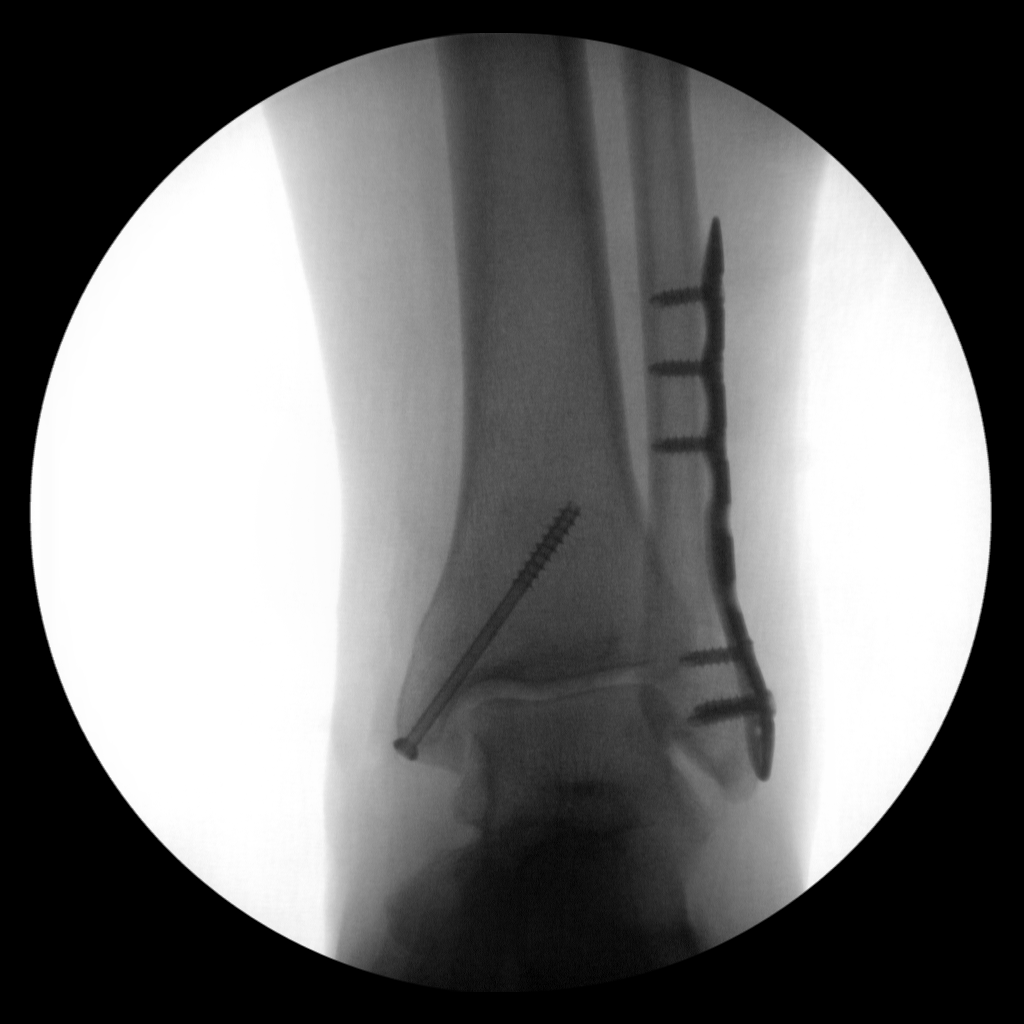
[im 2/3]
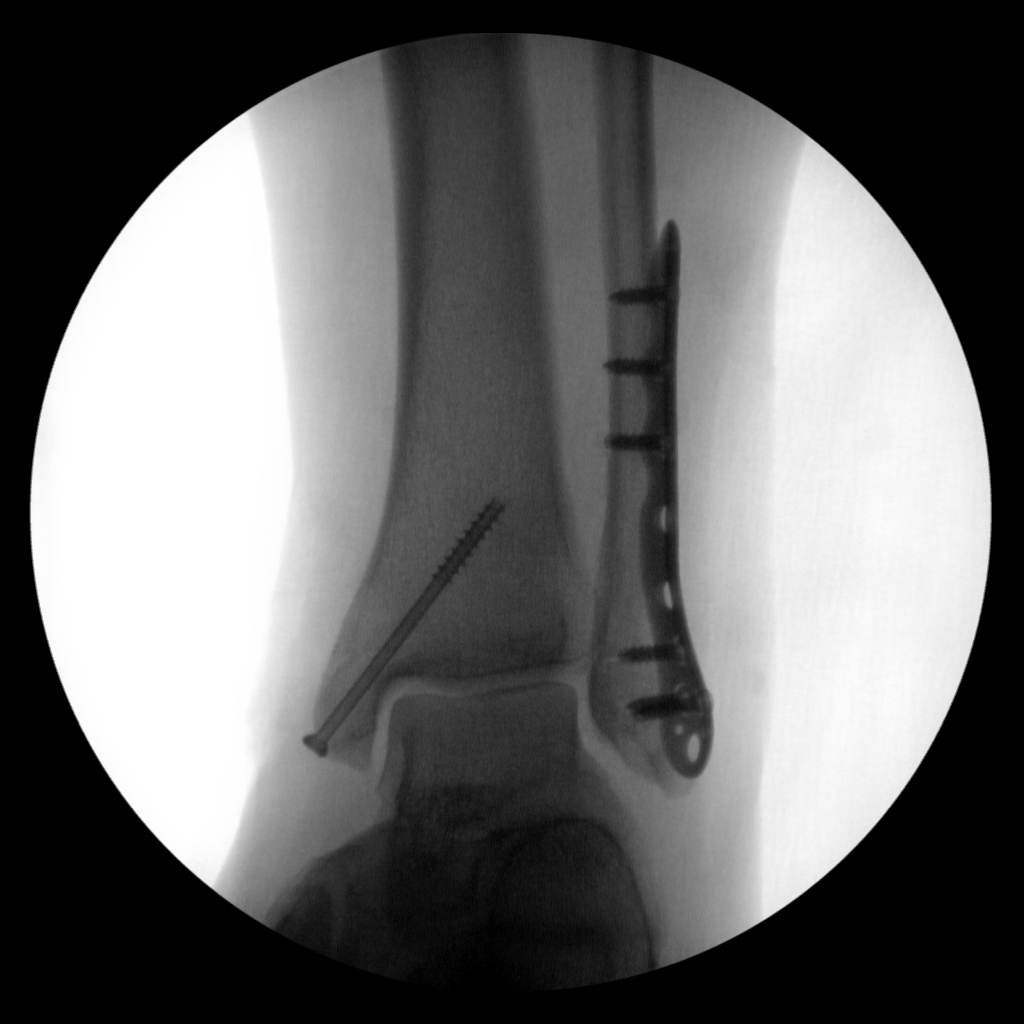
[im 3/3]
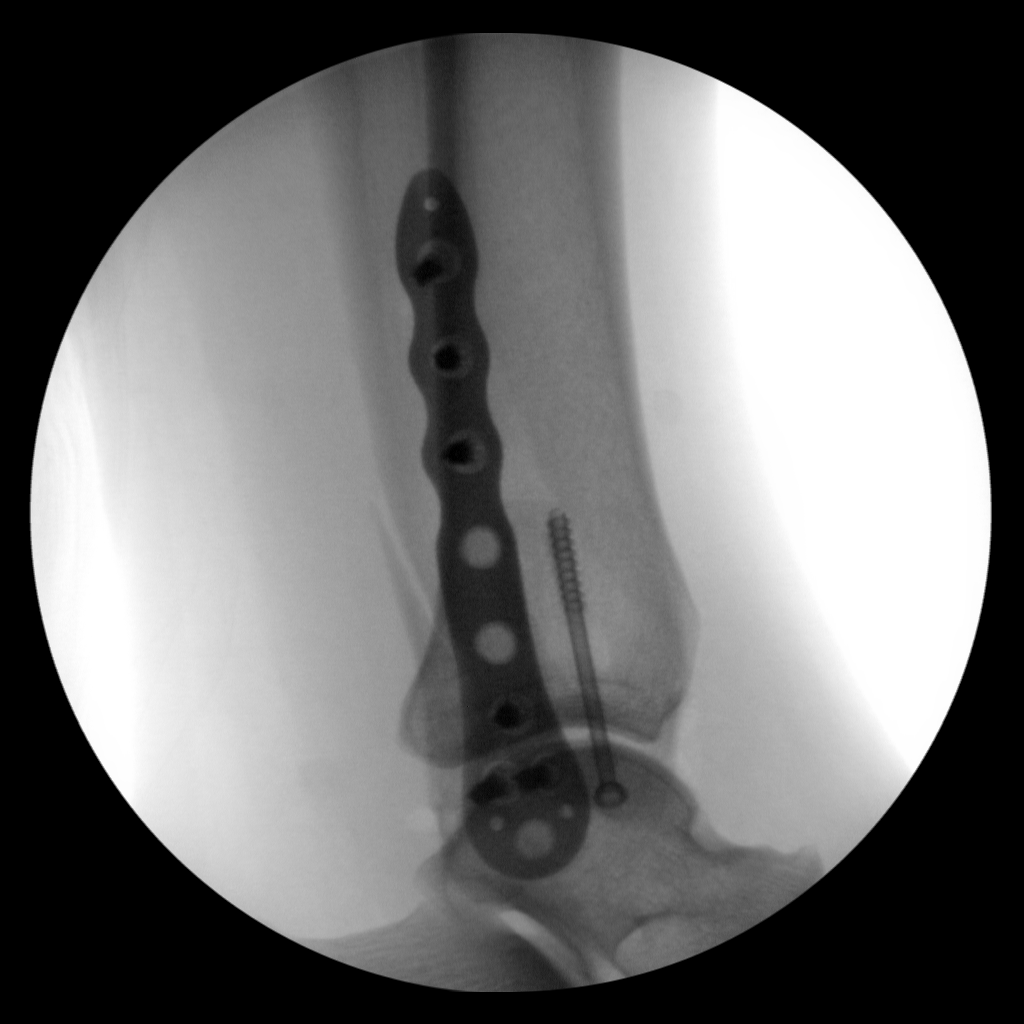

[3 of 3 positions shown; findings below may reference images not displayed]

FINDINGS: Three fluoroscopic images obtained during open reduction and
internal fixation of a left ankle fracture are provided. Total
fluoroscopy time was reported as 56 seconds with a dose of 2 mGy.
Screw and plate hardware is seen at the distal fibula, with a lag
screw traversing the medial malleolus.
IMPRESSION: Intraoperative images from open reduction and internal fixation of
the left ankle.

## 2018-02-09 ENCOUNTER — Other Ambulatory Visit: Payer: Self-pay | Admitting: Family Medicine

## 2018-02-09 DIAGNOSIS — R5381 Other malaise: Secondary | ICD-10-CM

## 2019-03-14 ENCOUNTER — Encounter: Payer: Self-pay | Admitting: Family Medicine

## 2019-03-14 ENCOUNTER — Other Ambulatory Visit: Payer: Self-pay

## 2019-03-14 ENCOUNTER — Ambulatory Visit (INDEPENDENT_AMBULATORY_CARE_PROVIDER_SITE_OTHER): Payer: 59 | Admitting: Family Medicine

## 2019-03-14 DIAGNOSIS — M79671 Pain in right foot: Secondary | ICD-10-CM

## 2019-03-14 DIAGNOSIS — R200 Anesthesia of skin: Secondary | ICD-10-CM

## 2019-03-14 DIAGNOSIS — M79672 Pain in left foot: Secondary | ICD-10-CM | POA: Diagnosis not present

## 2019-03-14 MED ORDER — GABAPENTIN 300 MG PO CAPS: ORAL_CAPSULE | ORAL | 3 refills | Status: DC

## 2019-03-14 MED ORDER — DICLOFENAC SODIUM 1 % EX GEL: 4.0000 g | Freq: Four times a day (QID) | CUTANEOUS | 6 refills | Status: AC | PRN

## 2019-03-14 MED ORDER — NITROGLYCERIN 0.1 MG/HR TD PT24: MEDICATED_PATCH | TRANSDERMAL | 3 refills | Status: DC

## 2019-03-14 NOTE — Progress Notes (Signed)
Office Visit Note   Patient: Julia Dunn           Date of Birth: May 16, 1970           MRN: [id number] Visit Date: 03/14/2019 Requested by: Boykin Nearing, MD No address on file PCP: Boykin Nearing, MD  Subjective: Chief Complaint  Patient presents with  . numbness in both hands/wrists  . pain and numbness both feet    HPI: She is here with bilateral hand numbness and bilateral heel pain.  Her hands been feeling numb intermittently for several months but in the past month they have been constantly numb, and she has trouble sleeping.  She has diabetes which is not well controlled, her most recent A1c was 9.8.  She is concerned about the possibility of neuropathy.  Denies any neck pain.  She is right-hand dominant.  She works for [employer] but unfortunately she recently lost her job.  Both of her heels have been hurting for a while.  Pain on the posterior aspect.  Denies any numbness in her feet.              ROS: No fevers or chills.  All other systems were reviewed and are negative.  Objective: Vital Signs: LMP 06/29/2012   Physical Exam:  General:  Alert and oriented, in no acute distress. Pulm:  Breathing unlabored. Psy:  Normal mood, congruent affect. Skin: No rash or erythema. Hands: She has good neck range of motion, 5/5 bilateral upper extremity strength.  Positive Tinel's at the carpal tunnel bilaterally and positive Phalen's test.  No thenar atrophy. Heels: The right one is more tender than the left at the Achilles insertion on the calcaneus.  She has a possible Haglund's deformity palpable.   Imaging: None today  Assessment & Plan: 1.  Bilateral hand numbness, suspect carpal tunnel syndrome but cannot rule out diabetic neuropathy -Nerve studies to evaluate.  Carpal tunnel night splints.  Gabapentin to take for pain.  2.  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  Voltaren gel during the  day.  X-rays if symptoms persist, and ultrasound imaging.     Procedures: No procedures performed  No notes on file     PMFS History: Patient Active Problem List   Diagnosis Date Noted  . Closed displaced bimalleolar fracture of left ankle 01/04/2016  . Abnormal EKG 06/22/2014  . Abnormal CT scan of lung 06/22/2014  . Dyspnea 06/19/2014  . Microscopic hematuria 04/07/2014  . Onychomycosis 05/31/2013  . BI-RADS category 3 mammogram result 03/07/2013  . Vitamin D insufficiency 08/13/2011  . Morbid obesity ([place]) 08/10/2007  . ALLERGIC RHINITIS, SEASONAL 07/14/2006  . DM2 (diabetes mellitus, type 2) 05/28/2006  . DEPRESSIVE DISORDER, NOS 05/28/2006  . HTN (hypertension) 05/28/2006   Past Medical History:  Diagnosis Date  . Anemia Dx 2013  . Asthma   . Bacterial vaginosis 2008  . Complication of anesthesia    pt states was informed had difficult time awakening following hysterectomy   . Depression   . Depression   . Diabetes mellitus Dx 2005  . Fibroids 09/05/2011   08/08/2011: Anteverted uterus. Diffuse fibroid involvement. Left of midline fibroid with submucosal component. 3.4cm x 2.8cmx 3.4cm.   Marland Kitchen GERD (gastroesophageal reflux disease)   . Gestational diabetes   . H/O shortness of breath    pt states has improved since lossing weight; triggers currently are enviromental pt states can make it to top of stairs w/o having to stop  to catch her breath  . Headache(784.0)    migraines  . History of blood transfusion 2013/2014  . History of bronchitis   . Hx of blood clots   . Hypertension Dx 2015  . Kidney infection 2015  . Menorrhagia 09/27/2010  . Pneumonia   . Postpartum depression   . Pregnancy induced hypertension   . Trichomonas     Family History  Problem Relation Age of Onset  . Hypertension Brother   . Stroke Brother   . Sickle cell trait Daughter   . Hypertension Father   . Heart disease Mother   . Rheumatic fever Mother   . Hypertension Mother   .  Stroke Mother   . Heart disease Paternal Aunt     Past Surgical History:  Procedure Laterality Date  . ABDOMINAL HYSTERECTOMY N/A 11/03/2012   Procedure: HYSTERECTOMY ABDOMINAL;  Surgeon: Ena Dawley, MD;  Location: [facility];  Service: Gynecology;  Laterality: N/A;  . BILATERAL SALPINGECTOMY Bilateral 11/03/2012   Procedure: BILATERAL SALPINGECTOMY;  Surgeon: Ena Dawley, MD;  Location: [facility];  Service: Gynecology;  Laterality: Bilateral;  . BURCH PROCEDURE N/A 11/03/2012   Procedure: BURCH PROCEDURE;  Surgeon: Ena Dawley, MD;  Location: [facility];  Service: Gynecology;  Laterality: N/A;  . CYSTOSCOPY N/A 11/03/2012   Procedure: CYSTOSCOPY;  Surgeon: Ena Dawley, MD;  Location: [facility];  Service: Gynecology;  Laterality: N/A;  . DILATION AND CURETTAGE OF UTERUS    . LAPAROSCOPY N/A 11/03/2012   Procedure: LAPAROSCOPY DIAGNOSTIC;  Surgeon: Ena Dawley, MD;  Location: [facility];  Service: Gynecology;  Laterality: N/A;  . ORIF ANKLE FRACTURE Left 01/04/2016   Procedure: OPEN REDUCTION INTERNAL FIXATION (ORIF) LEFT BIMALLEOLAR ANKLE FRACTURE;  Surgeon: Mcarthur Rossetti, MD;  Location: [facility];  Service: Orthopedics;  Laterality: Left;  . TUBAL LIGATION    . WISDOM TOOTH EXTRACTION     3 teeth   Social History   Occupational History  . Not on file  Tobacco Use  . Smoking status: Never Smoker  . Smokeless tobacco: Never Used  Substance and Sexual Activity  . Alcohol use: Yes    Comment: rarely  . Drug use: No  . Sexual activity: Not Currently    Birth control/protection: Surgical    Comment: hysterectomy

## 2019-03-17 ENCOUNTER — Telehealth: Payer: Self-pay | Admitting: Family Medicine

## 2019-03-17 NOTE — Telephone Encounter (Signed)
See message.

## 2019-03-17 NOTE — Telephone Encounter (Signed)
I talked to the patient and let her know you really need the results of her nerve study before we can really do anything else. But she wanted me to let you know anyway She has an appt for the nerve study on the 12th of January

## 2019-03-17 NOTE — Telephone Encounter (Signed)
Patient called. She was given a brace after her last appointment. She feels her symptoms have worsened as the area is more swollen and painful. She did not want to schedule an appointment without speaking to a nurse or Dr.Xu.  Call back number: [phone]

## 2019-03-17 NOTE — Telephone Encounter (Signed)
See message. What do you recommend?

## 2019-03-17 NOTE — Telephone Encounter (Signed)
[facility] patient 

## 2019-04-12 ENCOUNTER — Ambulatory Visit (INDEPENDENT_AMBULATORY_CARE_PROVIDER_SITE_OTHER): Payer: 59 | Admitting: Physical Medicine and Rehabilitation

## 2019-04-12 ENCOUNTER — Other Ambulatory Visit: Payer: Self-pay

## 2019-04-12 DIAGNOSIS — R202 Paresthesia of skin: Secondary | ICD-10-CM

## 2019-04-12 NOTE — Progress Notes (Addendum)
 .  Numeric Pain Rating Scale and Functional Assessment Average Pain 8   In the last MONTH (on 0-10 scale) has pain interfered with the following?  1. General activity like being  able to carry out your everyday physical activities such as walking, climbing stairs, carrying groceries, or moving a chair?  Rating(8)   

## 2019-04-12 NOTE — Progress Notes (Signed)
Julia Dunn - 49 y.o. female MRN [id number]  Date of birth: March 21, 1971  Office Visit Note: Visit Date: 04/12/2019 PCP: Boykin Nearing, MD Referred by: Boykin Nearing, MD  Subjective: Chief Complaint  Patient presents with  . Right Shoulder - Pain, Numbness, Tingling  . Right Arm - Tingling, Pain, Numbness  . Right Hand - Tingling, Pain, Numbness  . Left Hand - Tingling, Pain, Numbness   HPI: Julia Dunn is a 49 y.o. female who comes in today At the request of Dr. Eunice Blase for electrodiagnostic study of both upper limbs.  Patient is right-hand dominant but reports pain numbness and tingling really in all the fingertips of both hands right and left.  Fingertip numbness is equal right and left.  She has more symptoms in the right hand that can refer into the right arm and shoulder.  She has noted some weakness in the hands and dropping objects.  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  She does do some work with her hands around the house with some remodeling.  She denies any neck pain or frank radicular symptoms.  She is a type II diabetic.  Her last hemoglobin A1c was 9.8.  She has family members with neuropathy and almost all her family has diabetes.  He is.  She has not had prior electrodiagnostic studies.  Symptoms are intermittent.  Sometimes worse with using the phone and holding phone and worse nocturnally.  ROS Otherwise per HPI.  Assessment & Plan: Visit Diagnoses:  1. 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   There is no significant electrodiagnostic evidence of any other focal nerve entrapment, brachial plexopathy or cervical radiculopathy.   Recommendations: 1.  Follow-up with referring physician. 2.  Continue current management of  symptoms. 3.  Suggest surgical evaluation.  Meds & Orders: No orders of the defined types were placed in this encounter.   Orders Placed This Encounter  Procedures  . NCV with EMG (electromyography)    Follow-up: Return in about 2 weeks (around 04/26/2019) for [employer]. MD.   Procedures: No procedures performed  EMG & NCV Findings: Evaluation of the left median motor nerve showed prolonged distal onset latency (8.6 ms), reduced amplitude (2.7 mV), and decreased conduction velocity (Elbow-Wrist, 35 m/s).  The right median motor nerve showed prolonged distal onset latency (8.4 ms) and reduced amplitude (1.4 mV).  The left median (across palm) sensory and the right median (across palm) sensory nerves showed no response (Wrist) and no response (Palm).  All remaining nerves (as indicated in the following tables) were within normal limits.  Left vs. Right side comparison data for the median motor nerve indicates abnormal L-R velocity difference (Elbow-Wrist, 119 m/s).  The ulnar motor nerve indicates abnormal L-R velocity difference (A Elbow-B Elbow, 26 m/s).  All remaining left vs. right side differences were within normal limits.    Needle evaluation of the right first dorsal interosseous muscle showed increased insertional activity and moderately increased spontaneous activity.  All remaining muscles (as indicated in the following table) showed no evidence of electrical instability.    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   There is  no significant electrodiagnostic evidence of any other focal nerve entrapment, brachial plexopathy or cervical radiculopathy.   Recommendations: 1.  Follow-up with referring physician. 2.  Continue current management of symptoms. 3.  Suggest surgical evaluation.  ___________________________ Laurence Spates FAAPMR Board Certified, American Board of Physical Medicine and Rehabilitation    Nerve Conduction Studies Anti Sensory Summary Table   Stim Site NR Peak (ms) Norm Peak (ms) P-T Amp (V) Norm P-T Amp Site1 Site2 Delta-P (ms) Dist (cm) Vel (m/s) Norm Vel (m/s)  Left Median Acr Palm Anti Sensory (2nd Digit)  32.4C  Wrist *NR  <3.6  >10 Wrist Palm  0.0    Palm *NR  <2.0          Right Median Acr Palm Anti Sensory (2nd Digit)  31.8C  Wrist *NR  <3.6  >10 Wrist Palm  0.0    Palm *NR  <2.0          Left Radial Anti Sensory (Base 1st Digit)  32C  Wrist    2.1 <3.1 20.7  Wrist Base 1st Digit 2.1 0.0    Right Radial Anti Sensory (Base 1st Digit)  31.6C  Wrist    2.2 <3.1 30.2  Wrist Base 1st Digit 2.2 0.0    Left Ulnar Anti Sensory (5th Digit)  32.6C  Wrist    3.1 <3.7 18.6 >15.0 Wrist 5th Digit 3.1 14.0 45 >38  Right Ulnar Anti Sensory (5th Digit)  31.8C  Wrist    3.2 <3.7 17.8 >15.0 Wrist 5th Digit 3.2 14.0 44 >38   Motor Summary Table   Stim Site NR Onset (ms) Norm Onset (ms) O-P Amp (mV) Norm O-P Amp Site1 Site2 Delta-0 (ms) Dist (cm) Vel (m/s) Norm Vel (m/s)  Left Median Motor (Abd Poll Brev)  32.3C  Wrist    *8.6 <4.2 *2.7 >5 Elbow Wrist 5.9 20.5 *35 >50  Elbow    14.5  1.3         Right Median Motor (Abd Sand Pillow Brev)  31.6C    Myrtis Hopping  Wrist    *8.4 <4.2 *1.4 >5 Elbow Wrist 1.4 21.5 154 >50  Elbow    9.8  1.3         Left Ulnar Motor (Abd Dig Min)  32.4C  Wrist    2.9 <4.2 9.0 >3 B Elbow Wrist 3.7 21.0 57 >53  B Elbow    6.6  6.7  A Elbow B Elbow 1.7 11.0 65 >53  A Elbow    8.3  7.6         Right Ulnar Motor (Abd Dig Min)  31.7C  Wrist    3.0 <4.2 7.5 >3 B Elbow Wrist 3.9 20.5 53 >53  B Elbow    6.9  5.7  A Elbow B Elbow 1.1 10.0 91 >53  A Elbow    8.0  5.4          EMG   Side Muscle Nerve Root Ins Act Fibs Psw Amp Dur Poly Recrt Int Fraser Din Comment  Right 1stDorInt Ulnar C8-T1 *Incr *2+ *2+ Nml Nml 0 Nml Nml   Right Abd Poll Brev Median C8-T1 Nml Nml Nml  Nml Nml 0 Nml Nml   Right ExtDigCom   Nml Nml Nml Nml Nml 0 Nml Nml   Right Triceps Radial C6-7-8 Nml Nml Nml Nml Nml 0 Nml Nml   Right Deltoid Axillary C5-6 Nml Nml Nml Nml Nml 0 Nml Nml     Nerve Conduction Studies Anti Sensory  Left/Right Comparison   Stim Site L Lat (ms) R Lat (ms) L-R Lat (ms) L Amp (V) R Amp (V) L-R Amp (%) Site1 Site2 L Vel (m/s) R Vel (m/s) L-R Vel (m/s)  Median Acr Palm Anti Sensory (2nd Digit)  32.4C  Wrist       Wrist Palm     Palm             Radial Anti Sensory (Base 1st Digit)  32C  Wrist 2.1 2.2 0.1 20.7 30.2 31.5 Wrist Base 1st Digit     Ulnar Anti Sensory (5th Digit)  32.6C  Wrist 3.1 3.2 0.1 18.6 17.8 4.3 Wrist 5th Digit 45 44 1   Motor Left/Right Comparison   Stim Site L Lat (ms) R Lat (ms) L-R Lat (ms) L Amp (mV) R Amp (mV) L-R Amp (%) Site1 Site2 L Vel (m/s) R Vel (m/s) L-R Vel (m/s)  Median Motor (Abd Poll Brev)  32.3C  Wrist *8.6 *8.4 0.2 *2.7 *1.4 48.1 Elbow Wrist *35 154 *119  Elbow 14.5 9.8 4.7 1.3 1.3 0.0       Ulnar Motor (Abd Dig Min)  32.4C  Wrist 2.9 3.0 0.1 9.0 7.5 16.7 B Elbow Wrist 57 53 4  B Elbow 6.6 6.9 0.3 6.7 5.7 14.9 A Elbow B Elbow 65 91 *26  A Elbow 8.3 8.0 0.3 7.6 5.4 28.9          Waveforms:                      Clinical History: No specialty comments available.   She reports that she has never smoked. She has never used smokeless tobacco. No results for input(s): HGBA1C, LABURIC in the last 8760 hours.  Objective:  VS:  HT:    WT:   BMI:     BP:   HR: bpm  TEMP: ( )  RESP:  Physical Exam Musculoskeletal:        General: No swelling, tenderness or deformity.     Comments: Inspection reveals no atrophy of the bilateral APB or FDI or hand intrinsics. There is no swelling, color changes, allodynia or dystrophic changes. There is 5 out of 5 strength in the bilateral wrist extension, finger abduction and long finger flexion. There is intact sensation to light touch in all dermatomal and peripheral  nerve distributions.  There is a positive Phalen's test bilaterally. There is a negative Hoffmann's test bilaterally.  Skin:    General: Skin is warm and dry.     Findings: No erythema or rash.  Neurological:     General: No focal deficit present.     Mental Status: She is alert and oriented to person, place, and time.     Motor: No weakness or abnormal muscle tone.     Coordination: Coordination normal.  Psychiatric:        Mood and Affect: Mood normal.        Behavior: Behavior normal.     Ortho Exam Imaging: No results found.  Past Medical/Family/Surgical/Social History: Medications & Allergies reviewed per EMR, new medications updated. Patient Active Problem List   Diagnosis Date Noted  . Closed displaced bimalleolar fracture of left ankle 01/04/2016  . Abnormal EKG 06/22/2014  . Abnormal CT scan of lung 06/22/2014  . Dyspnea 06/19/2014  . Microscopic hematuria 04/07/2014  . Onychomycosis 05/31/2013  . BI-RADS category 3 mammogram result 03/07/2013  . Vitamin D insufficiency 08/13/2011  . Morbid obesity ([place]) 08/10/2007  . ALLERGIC  RHINITIS, SEASONAL 07/14/2006  . DM2 (diabetes mellitus, type 2) 05/28/2006  . DEPRESSIVE DISORDER, NOS 05/28/2006  . HTN (hypertension) 05/28/2006   Past Medical History:  Diagnosis Date  . Anemia Dx 2013  . Asthma   . Bacterial vaginosis 2008  . Complication of anesthesia    pt states was informed had difficult time awakening following hysterectomy   . Depression   . Depression   . Diabetes mellitus Dx 2005  . Fibroids 09/05/2011   08/08/2011: Anteverted uterus. Diffuse fibroid involvement. Left of midline fibroid with submucosal component. 3.4cm x 2.8cmx 3.4cm.   Marland Kitchen GERD (gastroesophageal reflux disease)   . Gestational diabetes   . H/O shortness of breath    pt states has improved since lossing weight; triggers currently are enviromental pt states can make it to top of stairs w/o having to stop to catch her breath  . Headache(784.0)     migraines  . History of blood transfusion 2013/2014  . History of bronchitis   . Hx of blood clots   . Hypertension Dx 2015  . Kidney infection 2015  . Menorrhagia 09/27/2010  . Pneumonia   . Postpartum depression   . Pregnancy induced hypertension   . Trichomonas    Family History  Problem Relation Age of Onset  . Hypertension Brother   . Stroke Brother   . Sickle cell trait Daughter   . Hypertension Father   . Heart disease Mother   . Rheumatic fever Mother   . Hypertension Mother   . Stroke Mother   . Heart disease Paternal Aunt    Past Surgical History:  Procedure Laterality Date  . ABDOMINAL HYSTERECTOMY N/A 11/03/2012   Procedure: HYSTERECTOMY ABDOMINAL;  Surgeon: Ena Dawley, MD;  Location: [facility];  Service: Gynecology;  Laterality: N/A;  . BILATERAL SALPINGECTOMY Bilateral 11/03/2012   Procedure: BILATERAL SALPINGECTOMY;  Surgeon: Ena Dawley, MD;  Location: [facility];  Service: Gynecology;  Laterality: Bilateral;  . BURCH PROCEDURE N/A 11/03/2012   Procedure: BURCH PROCEDURE;  Surgeon: Ena Dawley, MD;  Location: [facility];  Service: Gynecology;  Laterality: N/A;  . CYSTOSCOPY N/A 11/03/2012   Procedure: CYSTOSCOPY;  Surgeon: Ena Dawley, MD;  Location: [facility];  Service: Gynecology;  Laterality: N/A;  . DILATION AND CURETTAGE OF UTERUS    . LAPAROSCOPY N/A 11/03/2012   Procedure: LAPAROSCOPY DIAGNOSTIC;  Surgeon: Ena Dawley, MD;  Location: [facility];  Service: Gynecology;  Laterality: N/A;  . ORIF ANKLE FRACTURE Left 01/04/2016   Procedure: OPEN REDUCTION INTERNAL FIXATION (ORIF) LEFT BIMALLEOLAR ANKLE FRACTURE;  Surgeon: Mcarthur Rossetti, MD;  Location: [facility];  Service: Orthopedics;  Laterality: Left;  . TUBAL LIGATION    . WISDOM TOOTH EXTRACTION     3 teeth   Social History   Occupational History  . Not on file  Tobacco Use  . Smoking status: Never Smoker  . Smokeless tobacco: Never Used  Substance and Sexual Activity  . Alcohol use: Yes     Comment: rarely  . Drug use: No  . Sexual activity: Not Currently    Birth control/protection: Surgical    Comment: hysterectomy

## 2019-04-13 NOTE — Procedures (Signed)
EMG & NCV Findings: Evaluation of the left median motor nerve showed prolonged distal onset latency (8.6 ms), reduced amplitude (2.7 mV), and decreased conduction velocity (Elbow-Wrist, 35 m/s).  The right median motor nerve showed prolonged distal onset latency (8.4 ms) and reduced amplitude (1.4 mV).  The left median (across palm) sensory and the right median (across palm) sensory nerves showed no response (Wrist) and no response (Palm).  All remaining nerves (as indicated in the following tables) were within normal limits.  Left vs. Right side comparison data for the median motor nerve indicates abnormal L-R velocity difference (Elbow-Wrist, 119 m/s).  The ulnar motor nerve indicates abnormal L-R velocity difference (A Elbow-B Elbow, 26 m/s).  All remaining left vs. right side differences were within normal limits.    Needle evaluation of the right first dorsal interosseous muscle showed increased insertional activity and moderately increased spontaneous activity.  All remaining muscles (as indicated in the following table) showed no evidence of electrical instability.    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   There is no significant electrodiagnostic evidence of any other focal nerve entrapment, brachial plexopathy or cervical radiculopathy.   Recommendations: 1.  Follow-up with referring physician. 2.  Continue current management of symptoms. 3.  Suggest surgical evaluation.  ___________________________ Laurence Spates FAAPMR Board Certified, American Board of Physical Medicine and Rehabilitation    Nerve Conduction Studies Anti Sensory Summary Table   Stim Site NR Peak (ms) Norm Peak (ms) P-T Amp (V) Norm P-T Amp Site1 Site2 Delta-P (ms) Dist (cm) Vel (m/s) Norm Vel (m/s)  Left Median Acr Palm Anti Sensory (2nd Digit)  32.4C   Wrist *NR  <3.6  >10 Wrist Palm  0.0    Palm *NR  <2.0          Right Median Acr Palm Anti Sensory (2nd Digit)  31.8C  Wrist *NR  <3.6  >10 Wrist Palm  0.0    Palm *NR  <2.0          Left Radial Anti Sensory (Base 1st Digit)  32C  Wrist    2.1 <3.1 20.7  Wrist Base 1st Digit 2.1 0.0    Right Radial Anti Sensory (Base 1st Digit)  31.6C  Wrist    2.2 <3.1 30.2  Wrist Base 1st Digit 2.2 0.0    Left Ulnar Anti Sensory (5th Digit)  32.6C  Wrist    3.1 <3.7 18.6 >15.0 Wrist 5th Digit 3.1 14.0 45 >38  Right Ulnar Anti Sensory (5th Digit)  31.8C  Wrist    3.2 <3.7 17.8 >15.0 Wrist 5th Digit 3.2 14.0 44 >38   Motor Summary Table   Stim Site NR Onset (ms) Norm Onset (ms) O-P Amp (mV) Norm O-P Amp Site1 Site2 Delta-0 (ms) Dist (cm) Vel (m/s) Norm Vel (m/s)  Left Median Motor (Abd Poll Brev)  32.3C  Wrist    *8.6 <4.2 *2.7 >5 Elbow Wrist 5.9 20.5 *35 >50  Elbow    14.5  1.3         Right Median Motor (Abd Sledge Brev)  31.6C    Myrtis Hopping  Wrist    *8.4 <4.2 *1.4 >5 Elbow Wrist 1.4 21.5 154 >50  Elbow    9.8  1.3         Left Ulnar Motor (Abd Dig Min)  32.4C  Wrist    2.9 <4.2  9.0 >3 B Elbow Wrist 3.7 21.0 57 >53  B Elbow    6.6  6.7  A Elbow B Elbow 1.7 11.0 65 >53  A Elbow    8.3  7.6         Right Ulnar Motor (Abd Dig Min)  31.7C  Wrist    3.0 <4.2 7.5 >3 B Elbow Wrist 3.9 20.5 53 >53  B Elbow    6.9  5.7  A Elbow B Elbow 1.1 10.0 91 >53  A Elbow    8.0  5.4          EMG   Side Muscle Nerve Root Ins Act Fibs Psw Amp Dur Poly Recrt Int Fraser Din Comment  Right 1stDorInt Ulnar C8-T1 *Incr *2+ *2+ Nml Nml 0 Nml Nml   Right Abd Poll Brev Median C8-T1 Nml Nml Nml Nml Nml 0 Nml Nml   Right ExtDigCom   Nml Nml Nml Nml Nml 0 Nml Nml   Right Triceps Radial C6-7-8 Nml Nml Nml Nml Nml 0 Nml Nml   Right Deltoid Axillary C5-6 Nml Nml Nml Nml Nml 0 Nml Nml     Nerve Conduction Studies Anti Sensory Left/Right Comparison   Stim Site L Lat (ms) R Lat (ms) L-R Lat (ms) L Amp (V) R Amp (V)  L-R Amp (%) Site1 Site2 L Vel (m/s) R Vel (m/s) L-R Vel (m/s)  Median Acr Palm Anti Sensory (2nd Digit)  32.4C  Wrist       Wrist Palm     Palm             Radial Anti Sensory (Base 1st Digit)  32C  Wrist 2.1 2.2 0.1 20.7 30.2 31.5 Wrist Base 1st Digit     Ulnar Anti Sensory (5th Digit)  32.6C  Wrist 3.1 3.2 0.1 18.6 17.8 4.3 Wrist 5th Digit 45 44 1   Motor Left/Right Comparison   Stim Site L Lat (ms) R Lat (ms) L-R Lat (ms) L Amp (mV) R Amp (mV) L-R Amp (%) Site1 Site2 L Vel (m/s) R Vel (m/s) L-R Vel (m/s)  Median Motor (Abd Poll Brev)  32.3C  Wrist *8.6 *8.4 0.2 *2.7 *1.4 48.1 Elbow Wrist *35 154 *119  Elbow 14.5 9.8 4.7 1.3 1.3 0.0       Ulnar Motor (Abd Dig Min)  32.4C  Wrist 2.9 3.0 0.1 9.0 7.5 16.7 B Elbow Wrist 57 53 4  B Elbow 6.6 6.9 0.3 6.7 5.7 14.9 A Elbow B Elbow 65 91 *26  A Elbow 8.3 8.0 0.3 7.6 5.4 28.9          Waveforms:

## 2019-04-13 NOTE — Progress Notes (Signed)
Advised patient of her results. Appointment scheduled with Dr. Erlinda Hong on 04/27/19 at 1:30 for surgical consult.

## 2019-04-21 ENCOUNTER — Other Ambulatory Visit: Payer: Self-pay | Admitting: Pharmacist

## 2019-04-21 ENCOUNTER — Other Ambulatory Visit: Payer: Self-pay

## 2019-04-21 ENCOUNTER — Ambulatory Visit: Payer: 59 | Attending: Family Medicine | Admitting: Family Medicine

## 2019-04-21 ENCOUNTER — Encounter: Payer: Self-pay | Admitting: Family Medicine

## 2019-04-21 VITALS — BP 152/98 | HR 57 | Temp 98.8°F | Resp 16 | Ht 65.0 in | Wt 252.0 lb

## 2019-04-21 DIAGNOSIS — R0602 Shortness of breath: Secondary | ICD-10-CM

## 2019-04-21 DIAGNOSIS — F331 Major depressive disorder, recurrent, moderate: Secondary | ICD-10-CM

## 2019-04-21 DIAGNOSIS — Z91148 Patient's other noncompliance with medication regimen for other reason: Secondary | ICD-10-CM

## 2019-04-21 DIAGNOSIS — E1165 Type 2 diabetes mellitus with hyperglycemia: Secondary | ICD-10-CM | POA: Diagnosis not present

## 2019-04-21 DIAGNOSIS — R519 Headache, unspecified: Secondary | ICD-10-CM

## 2019-04-21 DIAGNOSIS — Z9114 Patient's other noncompliance with medication regimen: Secondary | ICD-10-CM

## 2019-04-21 DIAGNOSIS — I10 Essential (primary) hypertension: Secondary | ICD-10-CM

## 2019-04-21 DIAGNOSIS — G5603 Carpal tunnel syndrome, bilateral upper limbs: Secondary | ICD-10-CM | POA: Diagnosis not present

## 2019-04-21 LAB — POCT GLYCOSYLATED HEMOGLOBIN (HGB A1C): Hemoglobin A1C: 10.4 % — AB (ref 4.0–5.6)

## 2019-04-21 LAB — GLUCOSE, POCT (MANUAL RESULT ENTRY): POC Glucose: 243 mg/dL — AB (ref 70–99)

## 2019-04-21 MED ORDER — ATORVASTATIN CALCIUM 20 MG PO TABS: 20.0000 mg | ORAL_TABLET | Freq: Every day | ORAL | 3 refills | Status: DC

## 2019-04-21 MED ORDER — FLUOXETINE HCL 10 MG PO CAPS: 10.0000 mg | ORAL_CAPSULE | Freq: Every day | ORAL | 3 refills | Status: DC

## 2019-04-21 MED ORDER — INSULIN GLARGINE 100 UNITS/ML SOLOSTAR PEN: PEN_INJECTOR | SUBCUTANEOUS | 11 refills | Status: DC

## 2019-04-21 MED ORDER — METFORMIN HCL ER 500 MG PO TB24: 1000.0000 mg | ORAL_TABLET | Freq: Two times a day (BID) | ORAL | 4 refills | Status: DC

## 2019-04-21 MED ORDER — NAPROXEN 500 MG PO TABS: 500.0000 mg | ORAL_TABLET | Freq: Two times a day (BID) | ORAL | 2 refills | Status: DC

## 2019-04-21 MED ORDER — LANTUS SOLOSTAR 100 UNIT/ML ~~LOC~~ SOPN: 12.0000 [IU] | PEN_INJECTOR | Freq: Every day | SUBCUTANEOUS | 11 refills | Status: DC

## 2019-04-21 MED ORDER — AMLODIPINE BESYLATE 5 MG PO TABS: 5.0000 mg | ORAL_TABLET | Freq: Every day | ORAL | 3 refills | Status: DC

## 2019-04-21 MED ORDER — GABAPENTIN 300 MG PO CAPS: ORAL_CAPSULE | ORAL | 3 refills | Status: DC

## 2019-04-21 MED ORDER — LISINOPRIL-HYDROCHLOROTHIAZIDE 20-12.5 MG PO TABS: ORAL_TABLET | ORAL | 3 refills | Status: DC

## 2019-04-21 MED ORDER — TRUEPLUS PEN NEEDLES 32G X 4 MM MISC: 11 refills | Status: DC

## 2019-04-21 MED ORDER — ALBUTEROL SULFATE HFA 108 (90 BASE) MCG/ACT IN AERS: 2.0000 | INHALATION_SPRAY | Freq: Four times a day (QID) | RESPIRATORY_TRACT | 3 refills | Status: DC | PRN

## 2019-04-21 MED FILL — FLUoxetine HCL 10 MG CAPS: 10 | 30 days supply | Qty: 30 | Fill #0

## 2019-04-21 MED FILL — GABAPENTIN 300 MG CAPSULE: 300 | 30 days supply | Qty: 90 | Fill #0

## 2019-04-21 MED FILL — LISINOPRIL-HCTZ 20-12.5 MG: 20-12.5 | 30 days supply | Qty: 60 | Fill #0

## 2019-04-21 MED FILL — ATORVASTATIN CALCIUM 20 MG: 20 | 30 days supply | Qty: 30 | Fill #0

## 2019-04-21 MED FILL — metFORMIN HCL ER 500 MG TB2: 500 | 30 days supply | Qty: 120 | Fill #0

## 2019-04-21 MED FILL — ALBUTEROL SULFATE HFA 108 (: 108 (90 BAS | 25 days supply | Qty: 18 | Fill #0

## 2019-04-21 MED FILL — !LANTUS SOLOSTAR 100UNITS/M: 100 | 25 days supply | Qty: 3 | Fill #0

## 2019-04-21 MED FILL — AMLODIPINE BESYLATE 5 MG TA: 5 | 30 days supply | Qty: 30 | Fill #0

## 2019-04-21 MED FILL — NAPROXEN 500 MG TABLET: 500 | 30 days supply | Qty: 60 | Fill #0

## 2019-04-21 NOTE — Progress Notes (Signed)
C/o headache with light sensitivity X 6 days on and off. Pain 8/10 . States little relief with OTC meds.   Pt stated did not take BP meds for the past few days  CBG fasting 243  A1C today 10.4

## 2019-04-21 NOTE — Patient Instructions (Signed)
Hypertension, Adult Hypertension is another name for high blood pressure. High blood pressure forces your heart to work harder to pump blood. This can cause problems over time. There are two numbers in a blood pressure reading. There is a top number (systolic) over a bottom number (diastolic). It is best to have a blood pressure that is below 120/80. Healthy choices can help lower your blood pressure, or you may need medicine to help lower it. What are the causes? The cause of this condition is not known. Some conditions may be related to high blood pressure. What increases the risk?  Smoking.  Having type 2 diabetes mellitus, high cholesterol, or both.  Not getting enough exercise or physical activity.  Being overweight.  Having too much fat, sugar, calories, or salt (sodium) in your diet.  Drinking too much alcohol.  Having long-term (chronic) kidney disease.  Having a family history of high blood pressure.  Age. Risk increases with age.  Race. You may be at higher risk if you are African American.  Gender. Men are at higher risk than women before age 45. After age 65, women are at higher risk than men.  Having obstructive sleep apnea.  Stress. What are the signs or symptoms?  High blood pressure may not cause symptoms. Very high blood pressure (hypertensive crisis) may cause: ? Headache. ? Feelings of worry or nervousness (anxiety). ? Shortness of breath. ? Nosebleed. ? A feeling of being sick to your stomach (nausea). ? Throwing up (vomiting). ? Changes in how you see. ? Very bad chest pain. ? Seizures. How is this treated?  This condition is treated by making healthy lifestyle changes, such as: ? Eating healthy foods. ? Exercising more. ? Drinking less alcohol.  Your health care provider may prescribe medicine if lifestyle changes are not enough to get your blood pressure under control, and if: ? Your top number is above 130. ? Your bottom number is above  80.  Your personal target blood pressure may vary. Follow these instructions at home: Eating and drinking   If told, follow the DASH eating plan. To follow this plan: ? Fill one half of your plate at each meal with fruits and vegetables. ? Fill one fourth of your plate at each meal with whole grains. Whole grains include whole-wheat pasta, brown rice, and whole-grain bread. ? Eat or drink low-fat dairy products, such as skim milk or low-fat yogurt. ? Fill one fourth of your plate at each meal with low-fat (lean) proteins. Low-fat proteins include fish, chicken without skin, eggs, beans, and tofu. ? Avoid fatty meat, cured and processed meat, or chicken with skin. ? Avoid pre-made or processed food.  Eat less than 1,500 mg of salt each day.  Do not drink alcohol if: ? Your doctor tells you not to drink. ? You are pregnant, may be pregnant, or are planning to become pregnant.  If you drink alcohol: ? Limit how much you use to:  0-1 drink a day for women.  0-2 drinks a day for men. ? Be aware of how much alcohol is in your drink. In the [place] one drink equals one 12 oz bottle of beer (355 mL), one 5 oz glass of wine (148 mL), or one 1 oz glass of hard liquor (44 mL). Lifestyle   Work with your doctor to stay at a healthy weight or to lose weight. Ask your doctor what the best weight is for you.  Get at least 30 minutes of exercise most   days of the week. This may include walking, swimming, or biking.  Get at least 30 minutes of exercise that strengthens your muscles (resistance exercise) at least 3 days a week. This may include lifting weights or doing Pilates.  Do not use any products that contain nicotine or tobacco, such as cigarettes, e-cigarettes, and chewing tobacco. If you need help quitting, ask your doctor.  Check your blood pressure at home as told by your doctor.  Keep all follow-up visits as told by your doctor. This is important. Medicines  Take over-the-counter  and prescription medicines only as told by your doctor. Follow directions carefully.  Do not skip doses of blood pressure medicine. The medicine does not work as well if you skip doses. Skipping doses also puts you at risk for problems.  Ask your doctor about side effects or reactions to medicines that you should watch for. Contact a doctor if you:  Think you are having a reaction to the medicine you are taking.  Have headaches that keep coming back (recurring).  Feel dizzy.  Have swelling in your ankles.  Have trouble with your vision. Get help right away if you:  Get a very bad headache.  Start to feel mixed up (confused).  Feel weak or numb.  Feel faint.  Have very bad pain in your: ? Chest. ? Belly (abdomen).  Throw up more than once.  Have trouble breathing. Summary  Hypertension is another name for high blood pressure.  High blood pressure forces your heart to work harder to pump blood.  For most people, a normal blood pressure is less than 120/80.  Making healthy choices can help lower blood pressure. If your blood pressure does not get lower with healthy choices, you may need to take medicine. This information is not intended to replace advice given to you by your health care provider. Make sure you discuss any questions you have with your health care provider. Document Revised: 11/25/2017 Document Reviewed: 11/25/2017 [employer] Patient Education  2020 [employer] Diabetes Basics  Diabetes (diabetes mellitus) is a long-term (chronic) disease. It occurs when the body does not properly use sugar (glucose) that is released from food after you eat. Diabetes may be caused by one or both of these problems:  Your pancreas does not make enough of a hormone called insulin.  Your body does not react in a normal way to insulin that it makes. Insulin lets sugars (glucose) go into cells in your body. This gives you energy. If you have diabetes, sugars cannot get into  cells. This causes high blood sugar (hyperglycemia). Follow these instructions at home: How is diabetes treated? You may need to take insulin or other diabetes medicines daily to keep your blood sugar in balance. Take your diabetes medicines every day as told by your doctor. List your diabetes medicines here: Diabetes medicines  Name of medicine: ______________________________ ? Amount (dose): _______________ Time (a.m./p.m.): _______________ Notes: ___________________________________  Name of medicine: ______________________________ ? Amount (dose): _______________ Time (a.m./p.m.): _______________ Notes: ___________________________________  Name of medicine: ______________________________ ? Amount (dose): _______________ Time (a.m./p.m.): _______________ Notes: ___________________________________ If you use insulin, you will learn how to give yourself insulin by injection. You may need to adjust the amount based on the food that you eat. List the types of insulin you use here: Insulin  Insulin type: ______________________________ ? Amount (dose): _______________ Time (a.m./p.m.): _______________ Notes: ___________________________________  Insulin type: ______________________________ ? Amount (dose): _______________ Time (a.m./p.m.): _______________ Notes: ___________________________________  Insulin type: ______________________________ ? Amount (dose):   _______________ Time (a.m./p.m.): _______________ Notes: ___________________________________  Insulin type: ______________________________ ? Amount (dose): _______________ Time (a.m./p.m.): _______________ Notes: ___________________________________  Insulin type: ______________________________ ? Amount (dose): _______________ Time (a.m./p.m.): _______________ Notes: ___________________________________ How do I manage my blood sugar?  Check your blood sugar levels using a blood glucose monitor as directed by your doctor. Your doctor  will set treatment goals for you. Generally, you should have these blood sugar levels:  Before meals (preprandial): 80-130 mg/dL (4.4-7.2 mmol/L).  After meals (postprandial): below 180 mg/dL (10 mmol/L).  A1c level: less than 7%. Write down the times that you will check your blood sugar levels: Blood sugar checks  Time: _______________ Notes: ___________________________________  Time: _______________ Notes: ___________________________________  Time: _______________ Notes: ___________________________________  Time: _______________ Notes: ___________________________________  Time: _______________ Notes: ___________________________________  Time: _______________ Notes: ___________________________________  What do I need to know about low blood sugar? Low blood sugar is called hypoglycemia. This is when blood sugar is at or below 70 mg/dL (3.9 mmol/L). Symptoms may include:  Feeling: ? Hungry. ? Worried or nervous (anxious). ? Sweaty and clammy. ? Confused. ? Dizzy. ? Sleepy. ? Sick to your stomach (nauseous).  Having: ? A fast heartbeat. ? A headache. ? A change in your vision. ? Tingling or no feeling (numbness) around the mouth, lips, or tongue. ? Jerky movements that you cannot control (seizure).  Having trouble with: ? Moving (coordination). ? Sleeping. ? Passing out (fainting). ? Getting upset easily (irritability). Treating low blood sugar To treat low blood sugar, eat or drink something sugary right away. If you can think clearly and swallow safely, follow the 15:15 rule:  Take 15 grams of a fast-acting carb (carbohydrate). Talk with your doctor about how much you should take.  Some fast-acting carbs are: ? Sugar tablets (glucose pills). Take 3-4 glucose pills. ? 6-8 pieces of hard candy. ? 4-6 oz (120-150 mL) of fruit juice. ? 4-6 oz (120-150 mL) of regular (not diet) soda. ? 1 Tbsp (15 mL) honey or sugar.  Check your blood sugar 15 minutes after you  take the carb.  If your blood sugar is still at or below 70 mg/dL (3.9 mmol/L), take 15 grams of a carb again.  If your blood sugar does not go above 70 mg/dL (3.9 mmol/L) after 3 tries, get help right away.  After your blood sugar goes back to normal, eat a meal or a snack within 1 hour. Treating very low blood sugar If your blood sugar is at or below 54 mg/dL (3 mmol/L), you have very low blood sugar (severe hypoglycemia). This is an emergency. Do not wait to see if the symptoms will go away. Get medical help right away. Call your local emergency services (911 in the [place] Do not drive yourself to the hospital. Questions to ask your health care provider  Do I need to meet with a diabetes educator?  What equipment will I need to care for myself at home?  What diabetes medicines do I need? When should I take them?  How often do I need to check my blood sugar?  What number can I call if I have questions?  When is my next doctor's visit?  Where can I find a support group for people with diabetes? Where to find more information  American Diabetes Association: www.diabetes.org  American Association of Diabetes Educators: www.diabeteseducator.org/patient-resources Contact a doctor if:  Your blood sugar is at or above 240 mg/dL (13.3 mmol/L) for 2 days in a row.  You have   been sick or have had a fever for 2 days or more, and you are not getting better.  You have any of these problems for more than 6 hours: ? You cannot eat or drink. ? You feel sick to your stomach (nauseous). ? You throw up (vomit). ? You have watery poop (diarrhea). Get help right away if:  Your blood sugar is lower than 54 mg/dL (3 mmol/L).  You get confused.  You have trouble: ? Thinking clearly. ? Breathing. Summary  Diabetes (diabetes mellitus) is a long-term (chronic) disease. It occurs when the body does not properly use sugar (glucose) that is released from food after digestion.  Take insulin  and diabetes medicines as told.  Check your blood sugar every day, as often as told.  Keep all follow-up visits as told by your doctor. This is important. This information is not intended to replace advice given to you by your health care provider. Make sure you discuss any questions you have with your health care provider. Document Revised: 12/08/2018 Document Reviewed: 06/19/2017 [employer] Patient Education  2020 [employer]  

## 2019-04-21 NOTE — Progress Notes (Signed)
Subjective:  Patient ID: Julia Dunn, female    DOB: [birth year]  Age: 49 y.o. MRN: [id number]  CC: New Patient (Initial Visit)   HPI Julia Dunn, 49 yo female, who presents to establish care.  She has most recently been followed by a [facility]  family medicine practice but recently lost her job and now is uninsured.  She reports medical history significant for type 2 diabetes, hypertension, carpal tunnel syndrome for which she is followed by orthopedics, recurrent depression and patient with complaint of issues with recurrent headaches.  Due to her loss of insurance, she has been out of some of her medications.  She has been rationing some of her medicines to help the last longer but admits that sometimes in the past she has not been compliant with taking medications on a daily basis.  She has had some increased thirst and occasional frequent urination associated with her blood sugars.  She has been having some recurrent headaches which occur once or twice per week which start at the back of the scalp.  She has feels as if she is having light sensitivity with her headaches over the past few days.  She also has high blood pressure but has been out of her blood pressure medicines for a few days..  She denies any dizziness and no issues with focal numbness or weakness.  She has taken cholesterol medication and denies increased muscle or joint pain with use of her cholesterol medicine.  She does need a refill of medicine for treatment of her carpal tunnel syndrome including gabapentin and naproxen.  She also needs refills of medications for treatment of depression.  She denies any suicidal thoughts or ideations but has had a depressed mood since being out of work.  Past Medical History:  Diagnosis Date  . Anemia Dx 2013  . Asthma   . Bacterial vaginosis 2008  . Complication of anesthesia    pt states was informed had difficult time awakening following hysterectomy   . Depression   . Depression   .  Diabetes mellitus Dx 2005  . Fibroids 09/05/2011   08/08/2011: Anteverted uterus. Diffuse fibroid involvement. Left of midline fibroid with submucosal component. 3.4cm x 2.8cmx 3.4cm.   Marland Kitchen GERD (gastroesophageal reflux disease)   . Gestational diabetes   . H/O shortness of breath    pt states has improved since lossing weight; triggers currently are enviromental pt states can make it to top of stairs w/o having to stop to catch her breath  . Headache(784.0)    migraines  . History of blood transfusion 2013/2014  . History of bronchitis   . Hx of blood clots   . Hypertension Dx 2015  . Kidney infection 2015  . Menorrhagia 09/27/2010  . Pneumonia   . Postpartum depression   . Pregnancy induced hypertension   . Trichomonas     Past Surgical History:  Procedure Laterality Date  . ABDOMINAL HYSTERECTOMY N/A 11/03/2012   Procedure: HYSTERECTOMY ABDOMINAL;  Surgeon: Ena Dawley, MD;  Location: [facility];  Service: Gynecology;  Laterality: N/A;  . BILATERAL SALPINGECTOMY Bilateral 11/03/2012   Procedure: BILATERAL SALPINGECTOMY;  Surgeon: Ena Dawley, MD;  Location: [facility];  Service: Gynecology;  Laterality: Bilateral;  . BURCH PROCEDURE N/A 11/03/2012   Procedure: BURCH PROCEDURE;  Surgeon: Ena Dawley, MD;  Location: [facility];  Service: Gynecology;  Laterality: N/A;  . CYSTOSCOPY N/A 11/03/2012   Procedure: CYSTOSCOPY;  Surgeon: Ena Dawley, MD;  Location: [facility];  Service: Gynecology;  Laterality:  N/A;  . DILATION AND CURETTAGE OF UTERUS    . LAPAROSCOPY N/A 11/03/2012   Procedure: LAPAROSCOPY DIAGNOSTIC;  Surgeon: Ena Dawley, MD;  Location: [facility];  Service: Gynecology;  Laterality: N/A;  . ORIF ANKLE FRACTURE Left 01/04/2016   Procedure: OPEN REDUCTION INTERNAL FIXATION (ORIF) LEFT BIMALLEOLAR ANKLE FRACTURE;  Surgeon: Mcarthur Rossetti, MD;  Location: [facility];  Service: Orthopedics;  Laterality: Left;  . TUBAL LIGATION    . WISDOM TOOTH EXTRACTION     3 teeth    Family  History  Problem Relation Age of Onset  . Hypertension Brother   . Stroke Brother   . Sickle cell trait Daughter   . Hypertension Father   . Heart disease Mother   . Rheumatic fever Mother   . Hypertension Mother   . Stroke Mother   . Heart disease Paternal Aunt     Social History   Tobacco Use  . Smoking status: Never Smoker  . Smokeless tobacco: Never Used  Substance Use Topics  . Alcohol use: Yes    Comment: rarely    ROS Review of Systems  Constitutional: Positive for fatigue. Negative for chills and fever.  HENT: Negative for sore throat and trouble swallowing.   Eyes: Positive for photophobia. Negative for visual disturbance.  Respiratory: Negative for cough and shortness of breath.   Cardiovascular: Negative for chest pain.  Gastrointestinal: Negative for abdominal pain, blood in stool, constipation, diarrhea and nausea.  Endocrine: Negative for cold intolerance, heat intolerance, polydipsia, polyphagia and polyuria.  Genitourinary: Positive for frequency. Negative for dysuria.  Musculoskeletal: Positive for arthralgias and gait problem (Occasional heel pain).  Neurological: Positive for numbness and headaches. Negative for dizziness.  Hematological: Negative for adenopathy. Does not bruise/bleed easily.  Psychiatric/Behavioral: Positive for dysphoric mood. Negative for self-injury and suicidal ideas. The patient is nervous/anxious.     Objective:   Today's Vitals: BP (!) 152/98 (BP Location: Left Arm)   Pulse (!) 57   Temp 98.8 F (37.1 C) (Oral)   Resp 16   Ht 5\' 5"  (1.651 m)   Wt 252 lb (114.3 kg)   LMP 06/29/2012   SpO2 98%   BMI 41.93 kg/m  repeat 152/98   Physical Exam Vitals and nursing note reviewed.  Constitutional:      Appearance: Normal appearance. She is obese.  Neck:     Vascular: No carotid bruit.  Cardiovascular:     Rate and Rhythm: Normal rate.  Pulmonary:     Effort: Pulmonary effort is normal. No respiratory distress.      Breath sounds: Normal breath sounds. No wheezing or rhonchi.  Abdominal:     Palpations: Abdomen is soft.     Tenderness: There is no abdominal tenderness. There is no right CVA tenderness, left CVA tenderness, guarding or rebound.  Musculoskeletal:        General: Tenderness present.     Cervical back: Normal range of motion and neck supple. No tenderness.     Right lower leg: No edema.     Left lower leg: No edema.     Comments: Bilateral wrists and achilles tendon insertion at heels  Lymphadenopathy:     Cervical: No cervical adenopathy.  Skin:    General: Skin is warm and dry.  Neurological:     General: No focal deficit present.     Mental Status: She is alert and oriented to person, place, and time.     Comments: Positive Tinel and Phalen right greater than  left  Psychiatric:        Mood and Affect: Mood normal.        Behavior: Behavior normal.     Comments: Slightly flattened affect     Assessment & Plan:  1. Uncontrolled type 2 diabetes mellitus with hyperglycemia ([place]) Review of chart through care everywhere, patient's most recent hemoglobin A1c done 12/16/2018 was 9.8.  Before that time, patient with hemoglobin A1c on 12/24/2017 of 7.7.  She will have repeat hemoglobin A1c and glucose at today's visit.  Blood sugar today was 243 and A1c of 10.4.  She is being provided with new prescriptions for Metformin as well as atorvastatin for hyperlipidemia.  On review of prior labs, her LDL has been at goal with LDL of 66 on 12/24/2017 and LDL of 71 on 12/16/2018.  She has been asked to start monitoring her blood sugars on a daily basis and to return for follow-up with the clinical pharmacist in a few weeks to see if she needs additional medication to help with the control of her blood sugars.  She does admit to past issues with noncompliance with medication. - Glucose (CBG) - HgB A1c - metFORMIN (GLUCOPHAGE-XR) 500 MG 24 hr tablet; Take 2 tablets (1,000 mg total) by mouth 2 (two) times  daily.  Dispense: 120 tablet; Refill: 4 - atorvastatin (LIPITOR) 20 MG tablet; Take 1 tablet (20 mg total) by mouth daily after supper.  Dispense: 30 tablet; Refill: 3 - Amb Referral to Clinical Pharmacist - Comprehensive metabolic panel  2. Essential hypertension Refills provided of lisinopril hydrochlorothiazide and amlodipine.  Stressed the importance of compliance with medication on a daily basis.  Patient will have blood pressure rechecked at upcoming visit with clinical pharmacist.  Amlodipine may need to be increased to 10 mg daily if blood pressure is not well controlled at follow-up.  We will check electrolytes as part of comprehensive metabolic panel. - lisinopril-hydrochlorothiazide (ZESTORETIC) 20-12.5 MG tablet; TAKE TWO TABLETS BY MOUTH ONCE DAILY  Dispense: 180 tablet; Refill: 3 - amLODipine (NORVASC) 5 MG tablet; Take 1 tablet (5 mg total) by mouth daily.  Dispense: 90 tablet; Refill: 3 - Amb Referral to Clinical Pharmacist  3. SOB (shortness of breath) on exertion Patient reports that she has occasional issues with shortness of breath on exertion which is relieved with the use of albuterol.  She denies tobacco use.  Patient is provided with refill of albuterol and will also check CBC to see if she may have anemia which may be contributing to her shortness of breath and patient may also need chest x-ray and further evaluation if shortness of breath continues. - albuterol (VENTOLIN HFA) 108 (90 Base) MCG/ACT inhaler; Inhale 2 puffs into the lungs every 6 (six) hours as needed for wheezing or shortness of breath.  Dispense: 18 g; Refill: 3 - CBC  4. Bilateral carpal tunnel syndrome Refill provided of gabapentin and naproxen for continued treatment of hand pain and numbness from carpal tunnel syndrome.  Office note reviewed from patient's orthopedic doctor, Dr. Junius Roads.  She is to continue to follow-up with orthopedics as needed. - gabapentin (NEURONTIN) 300 MG capsule; 1 PO q HS, may  increase to 1 PO TID if needed.  Dispense: 90 capsule; Refill: 3 - naproxen (NAPROSYN) 500 MG tablet; Take 1 tablet (500 mg total) by mouth 2 (two) times daily with a meal. As needed for pain/headache; take after eating  Dispense: 60 tablet; Refill: 2  5. Depression, major, recurrent, moderate ([place]) Prescription provided for  refill of fluoxetine which patient reports that she has taken long-term for depression.  She was also made aware that she will be contacted by the clinical social worker to help patient with further counseling regarding depression and to set up ongoing mental health care. - FLUoxetine (PROZAC) 10 MG capsule; Take 1 capsule (10 mg total) by mouth daily.  Dispense: 60 capsule; Refill: 3 - Ambulatory referral to Social Work  6. Nonintractable episodic headache, unspecified headache type Patient's headaches sound as if there may be tension type headaches.  Patient currently already takes naproxen for carpal tunnel syndrome.  She may use naproxen as needed for her headaches and also use warm moist heat to the posterior neck/back of the scalp when needed for headaches.  Also discussed the possibility that patient may be having rebound type headaches related to her use of naproxen therefore if headaches are not improving, she should try switching to Tylenol or over-the-counter Excedrin migraine to see if this helps with her headaches.  If she has any acute worsening of headache she should seek medical attention and schedule follow-up appointment. - naproxen (NAPROSYN) 500 MG tablet; Take 1 tablet (500 mg total) by mouth 2 (two) times daily with a meal. As needed for pain/headache; take after eating  Dispense: 60 tablet; Refill: 2  7. Noncompliance w/medication treatment due to intermit use of medication Discussed the importance with daily use and compliance of medications to help prevent long-term complications from her chronic medical issues.  Outpatient Encounter Medications as of  04/21/2019  Medication Sig  . albuterol (VENTOLIN HFA) 108 (90 Base) MCG/ACT inhaler Inhale 2 puffs into the lungs every 6 (six) hours as needed for wheezing or shortness of breath.  Marland Kitchen amLODipine (NORVASC) 5 MG tablet Take 1 tablet (5 mg total) by mouth daily.  Marland Kitchen atorvastatin (LIPITOR) 20 MG tablet Take 1 tablet (20 mg total) by mouth daily after supper.  . diclofenac Sodium (VOLTAREN) 1 % GEL Apply 4 g topically 4 (four) times daily as needed.  Marland Kitchen FLUoxetine (PROZAC) 10 MG capsule Take 1 capsule (10 mg total) by mouth daily.  Marland Kitchen gabapentin (NEURONTIN) 300 MG capsule 1 PO q HS, may increase to 1 PO TID if needed.  Marland Kitchen lisinopril-hydrochlorothiazide (ZESTORETIC) 20-12.5 MG tablet TAKE TWO TABLETS BY MOUTH ONCE DAILY  . metFORMIN (GLUCOPHAGE-XR) 500 MG 24 hr tablet Take 2 tablets (1,000 mg total) by mouth 2 (two) times daily.  . naproxen sodium (ANAPROX) 220 MG tablet Take 440 mg by mouth daily as needed (pain).  . nitroGLYCERIN (NITRO-DUR) 0.1 mg/hr patch Apply 1/4 patch to heel area 12 hours daily  . ONE TOUCH ULTRA TEST test strip USE  STRIP TO CHECK GLUCOSE THREE TIMES DAILY BEFORE MEAL(S)  . [DISCONTINUED] albuterol (PROVENTIL HFA;VENTOLIN HFA) 108 (90 Base) MCG/ACT inhaler Inhale 2 puffs into the lungs every 6 (six) hours as needed for wheezing or shortness of breath.  . [DISCONTINUED] amLODipine (NORVASC) 5 MG tablet Take 1 tablet (5 mg total) by mouth daily.  . [DISCONTINUED] atorvastatin (LIPITOR) 20 MG tablet Take by mouth.  . [DISCONTINUED] FLUoxetine (PROZAC) 10 MG capsule TAKE 1 CAPSULE BY MOUTH ONCE DAILY FOR 7 DAYS THEN TWO CAPSULES DAILY.  . [DISCONTINUED] gabapentin (NEURONTIN) 300 MG capsule 1 PO q HS, may increase to 1 PO TID if needed.  . [DISCONTINUED] glipiZIDE (GLUCOTROL XL) 10 MG 24 hr tablet glipizide ER 10 mg tablet, extended release 24 hr  . [DISCONTINUED] lisinopril-hydrochlorothiazide (PRINZIDE,ZESTORETIC) 20-12.5 MG tablet TAKE TWO TABLETS BY MOUTH  ONCE DAILY  .  [DISCONTINUED] metFORMIN (GLUCOPHAGE-XR) 500 MG 24 hr tablet Take 500 mg by mouth 2 (two) times daily.  . naproxen (NAPROSYN) 500 MG tablet Take 1 tablet (500 mg total) by mouth 2 (two) times daily with a meal. As needed for pain/headache; take after eating  . [DISCONTINUED] insulin glargine (LANTUS) 100 unit/mL SOPN Inject 12 units once per day after morning meal  . [DISCONTINUED] insulin glargine (LANTUS) 100 unit/mL SOPN Inject 12 units once per day after morning meal   No facility-administered encounter medications on file as of 04/21/2019.    An After Visit Summary was printed and given to the patient.   Follow-up: Return in about 6 weeks (around 06/02/2019) for DM/HTN; 2-3 weeks with Lurena Joiner.   Antony Blackbird MD

## 2019-04-22 ENCOUNTER — Telehealth (INDEPENDENT_AMBULATORY_CARE_PROVIDER_SITE_OTHER): Payer: Self-pay

## 2019-04-22 LAB — COMPREHENSIVE METABOLIC PANEL WITH GFR
ALT: 20 IU/L (ref 0–32)
AST: 16 IU/L (ref 0–40)
Albumin/Globulin Ratio: 1.7 (ref 1.2–2.2)
Albumin: 4.5 g/dL (ref 3.8–4.8)
Alkaline Phosphatase: 95 IU/L (ref 39–117)
BUN/Creatinine Ratio: 13 (ref 9–23)
BUN: 10 mg/dL (ref 6–24)
Bilirubin Total: 0.3 mg/dL (ref 0.0–1.2)
CO2: 25 mmol/L (ref 20–29)
Calcium: 9.3 mg/dL (ref 8.7–10.2)
Chloride: 102 mmol/L (ref 96–106)
Creatinine, Ser: 0.77 mg/dL (ref 0.57–1.00)
GFR calc Af Amer: 106 mL/min/1.73
GFR calc non Af Amer: 92 mL/min/1.73
Globulin, Total: 2.6 g/dL (ref 1.5–4.5)
Glucose: 252 mg/dL — ABNORMAL HIGH (ref 65–99)
Potassium: 4.4 mmol/L (ref 3.5–5.2)
Sodium: 142 mmol/L (ref 134–144)
Total Protein: 7.1 g/dL (ref 6.0–8.5)

## 2019-04-22 NOTE — Telephone Encounter (Signed)
-----   Message from Antony Blackbird, MD sent at 04/22/2019  1:01 PM EST ----- Glucose elevated at 252 otherwise normal electrolytes and liver enzymes on CMET

## 2019-04-22 NOTE — Telephone Encounter (Signed)
Patient verified date of birth. She is aware that CMET was normal other than elevated glucose of 252. Patient stated she is picking up medications today. She verbalized understanding of results. Nat Christen, CMA

## 2019-04-27 ENCOUNTER — Ambulatory Visit (INDEPENDENT_AMBULATORY_CARE_PROVIDER_SITE_OTHER): Payer: Self-pay | Admitting: Orthopaedic Surgery

## 2019-04-27 ENCOUNTER — Encounter: Payer: Self-pay | Admitting: Orthopaedic Surgery

## 2019-04-27 ENCOUNTER — Other Ambulatory Visit: Payer: Self-pay

## 2019-04-27 DIAGNOSIS — G5601 Carpal tunnel syndrome, right upper limb: Secondary | ICD-10-CM

## 2019-04-27 DIAGNOSIS — G5602 Carpal tunnel syndrome, left upper limb: Secondary | ICD-10-CM

## 2019-04-27 MED ORDER — LIDOCAINE HCL 1 % IJ SOLN
0.3000 mL | INTRAMUSCULAR | Status: AC | PRN
  Administered 2019-04-27: 14:00:00 .3 mL

## 2019-04-27 MED ORDER — BUPIVACAINE HCL 0.25 % IJ SOLN
0.3300 mL | INTRAMUSCULAR | Status: AC | PRN
  Administered 2019-04-27: 14:00:00 .33 mL

## 2019-04-27 MED ORDER — METHYLPREDNISOLONE ACETATE 40 MG/ML IJ SUSP
13.3300 mg | INTRAMUSCULAR | Status: AC | PRN
  Administered 2019-04-27: 14:00:00 13.33 mg

## 2019-04-27 NOTE — Progress Notes (Signed)
Office Visit Note   Patient: Julia Dunn           Date of Birth: 11-14-1970           MRN: [id number] Visit Date: 04/27/2019              Requested by: Boykin Nearing, MD No address on file PCP: Antony Blackbird, MD   Assessment & Plan: Visit Diagnoses:  1. Carpal tunnel syndrome, right upper limb   2. Carpal tunnel syndrome, left upper limb     Plan: Impression is bilateral severe carpal tunnel syndrome right greater than left. We discussed the need for surgical intervention however the patient is an uncontrolled diabetic and her last hemoglobin A1c was 10.4. We discussed that this needs to be under 8.0 before proceeding with surgery. She would like to proceed with a cortisone injection to the right, more symptomatic side today. We have discussed that this may not be of much benefit but she agrees to proceed. She will follow-up with [place] once her hemoglobin A1c is under control.  Follow-Up Instructions: Return if symptoms worsen or fail to improve.   Orders:  Orders Placed This Encounter  Procedures  . Hand/UE Inj: R carpal tunnel   No orders of the defined types were placed in this encounter.     Procedures: Hand/UE Inj: R carpal tunnel for carpal tunnel syndrome on 04/27/2019 1:52 PM Indications: pain Details: 25 G needle, volar approach Medications: 0.3 mL lidocaine 1 %; 0.33 mL bupivacaine 0.25 %; 13.33 mg methylPREDNISolone acetate 40 MG/ML      Clinical Data: No additional findings.   Subjective: Chief Complaint  Patient presents with  . Right Hand - Pain  . Left Hand - Pain    HPI patient is a pleasant 49 year old female who comes in today to discuss nerve conduction studies from bilateral upper extremities. She has been dealing with bilateral hand pain and numbness to the thumb, index, long and ring fingers for the past 3 to 6 months. She does work for [employer] and has been doing a lot of typing for the past 14 years. She notes some recent weakness  where she is starting to drop things. She has been wearing wrist splints and taking gabapentin with mild relief of symptoms. 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 She has not previously had a cortisone injection. Her most recent hemoglobin A1c from 6 days ago was 10.4. She has recently seen her PCP where they have changed some of her diabetes medications.  Review of Systems as detailed in HPI. All others reviewed and are negative.   Objective: Vital Signs: LMP 06/29/2012   Physical Exam well-developed well-nourished female no acute distress. Alert and oriented x3.  Ortho Exam examination of both wrists reveal positive Phalen and positive Tinel. Negative Tinel at the elbows. She does have slight decreased grip strength. Minimal thenar atrophy. Decreased sensation to the thumb, long and ring fingers both sides.  Specialty Comments:  No specialty comments available.  Imaging: No new imaging   PMFS History: Patient Active Problem List   Diagnosis Date Noted  . Closed displaced bimalleolar fracture of left ankle 01/04/2016  . Abnormal EKG 06/22/2014  . Abnormal CT scan of lung 06/22/2014  . Dyspnea 06/19/2014  . Microscopic hematuria 04/07/2014  . Onychomycosis 05/31/2013  . BI-RADS category 3 mammogram result 03/07/2013  . Vitamin D insufficiency 08/13/2011  . Morbid obesity ([place]) 08/10/2007  . ALLERGIC RHINITIS, SEASONAL 07/14/2006  . DM2 (  diabetes mellitus, type 2) 05/28/2006  . DEPRESSIVE DISORDER, NOS 05/28/2006  . HTN (hypertension) 05/28/2006   Past Medical History:  Diagnosis Date  . Anemia Dx 2013  . Asthma   . Bacterial vaginosis 2008  . Complication of anesthesia    pt states was informed had difficult time awakening following hysterectomy   . Depression   . Depression   . Diabetes mellitus Dx 2005  . Fibroids 09/05/2011   08/08/2011: Anteverted uterus. Diffuse fibroid involvement. Left of midline fibroid with submucosal  component. 3.4cm x 2.8cmx 3.4cm.   Marland Kitchen GERD (gastroesophageal reflux disease)   . Gestational diabetes   . H/O shortness of breath    pt states has improved since lossing weight; triggers currently are enviromental pt states can make it to top of stairs w/o having to stop to catch her breath  . Headache(784.0)    migraines  . History of blood transfusion 2013/2014  . History of bronchitis   . Hx of blood clots   . Hypertension Dx 2015  . Kidney infection 2015  . Menorrhagia 09/27/2010  . Pneumonia   . Postpartum depression   . Pregnancy induced hypertension   . Trichomonas     Family History  Problem Relation Age of Onset  . Hypertension Brother   . Stroke Brother   . Sickle cell trait Daughter   . Hypertension Father   . Heart disease Mother   . Rheumatic fever Mother   . Hypertension Mother   . Stroke Mother   . Heart disease Paternal Aunt     Past Surgical History:  Procedure Laterality Date  . ABDOMINAL HYSTERECTOMY N/A 11/03/2012   Procedure: HYSTERECTOMY ABDOMINAL;  Surgeon: Ena Dawley, MD;  Location: [facility];  Service: Gynecology;  Laterality: N/A;  . BILATERAL SALPINGECTOMY Bilateral 11/03/2012   Procedure: BILATERAL SALPINGECTOMY;  Surgeon: Ena Dawley, MD;  Location: [facility];  Service: Gynecology;  Laterality: Bilateral;  . BURCH PROCEDURE N/A 11/03/2012   Procedure: BURCH PROCEDURE;  Surgeon: Ena Dawley, MD;  Location: [facility];  Service: Gynecology;  Laterality: N/A;  . CYSTOSCOPY N/A 11/03/2012   Procedure: CYSTOSCOPY;  Surgeon: Ena Dawley, MD;  Location: [facility];  Service: Gynecology;  Laterality: N/A;  . DILATION AND CURETTAGE OF UTERUS    . LAPAROSCOPY N/A 11/03/2012   Procedure: LAPAROSCOPY DIAGNOSTIC;  Surgeon: Ena Dawley, MD;  Location: [facility];  Service: Gynecology;  Laterality: N/A;  . ORIF ANKLE FRACTURE Left 01/04/2016   Procedure: OPEN REDUCTION INTERNAL FIXATION (ORIF) LEFT BIMALLEOLAR ANKLE FRACTURE;  Surgeon: Mcarthur Rossetti, MD;   Location: [facility];  Service: Orthopedics;  Laterality: Left;  . TUBAL LIGATION    . WISDOM TOOTH EXTRACTION     3 teeth   Social History   Occupational History  . Not on file  Tobacco Use  . Smoking status: Never Smoker  . Smokeless tobacco: Never Used  Substance and Sexual Activity  . Alcohol use: Yes    Comment: rarely  . Drug use: No  . Sexual activity: Yes    Birth control/protection: Surgical    Comment: hysterectomy

## 2019-05-03 ENCOUNTER — Ambulatory Visit: Payer: Self-pay | Admitting: Family Medicine

## 2019-05-06 ENCOUNTER — Telehealth: Payer: Self-pay | Admitting: Licensed Clinical Social Worker

## 2019-05-06 NOTE — Telephone Encounter (Signed)
Call placed to patient regarding [facility] referral. LCSW left message requesting a return call.  

## 2019-05-12 ENCOUNTER — Telehealth: Payer: Self-pay | Admitting: Licensed Clinical Social Worker

## 2019-05-12 NOTE — Telephone Encounter (Signed)
Call placed to patient regarding [facility] referral. LCSW left message requesting a return call.  

## 2019-08-15 MED FILL — metFORMIN HCL ER 500 MG TB2: 500 | 30 days supply | Qty: 120 | Fill #1

## 2019-08-15 MED FILL — AMLODIPINE BESYLATE 5 MG TA: 5 | 30 days supply | Qty: 30 | Fill #1

## 2019-08-15 MED FILL — LISINOPRIL-HCTZ 20-12.5 MG: 20-12.5 | 30 days supply | Qty: 60 | Fill #1

## 2019-08-15 MED FILL — !LANTUS SOLOSTAR 100UNITS/M: 100 | 25 days supply | Qty: 3 | Fill #1

## 2019-09-05 ENCOUNTER — Ambulatory Visit: Payer: Self-pay | Admitting: Family Medicine

## 2019-09-15 ENCOUNTER — Emergency Department (HOSPITAL_COMMUNITY): Payer: Self-pay

## 2019-09-15 ENCOUNTER — Ambulatory Visit: Payer: Self-pay

## 2019-09-15 ENCOUNTER — Other Ambulatory Visit: Payer: Self-pay

## 2019-09-15 ENCOUNTER — Emergency Department (HOSPITAL_COMMUNITY)
Admission: EM | Admit: 2019-09-15 | Discharge: 2019-09-15 | Disposition: A | Discharge status: Home / Self Care | Payer: Self-pay | Attending: Emergency Medicine | Admitting: Emergency Medicine

## 2019-09-15 DIAGNOSIS — R05 Cough: Secondary | ICD-10-CM | POA: Insufficient documentation

## 2019-09-15 DIAGNOSIS — Z79899 Other long term (current) drug therapy: Secondary | ICD-10-CM | POA: Insufficient documentation

## 2019-09-15 DIAGNOSIS — R0789 Other chest pain: Secondary | ICD-10-CM | POA: Insufficient documentation

## 2019-09-15 DIAGNOSIS — R0602 Shortness of breath: Secondary | ICD-10-CM

## 2019-09-15 DIAGNOSIS — Z20822 Contact with and (suspected) exposure to covid-19: Secondary | ICD-10-CM | POA: Insufficient documentation

## 2019-09-15 DIAGNOSIS — J189 Pneumonia, unspecified organism: Secondary | ICD-10-CM | POA: Insufficient documentation

## 2019-09-15 DIAGNOSIS — Z794 Long term (current) use of insulin: Secondary | ICD-10-CM | POA: Insufficient documentation

## 2019-09-15 DIAGNOSIS — R059 Cough, unspecified: Secondary | ICD-10-CM

## 2019-09-15 DIAGNOSIS — E119 Type 2 diabetes mellitus without complications: Secondary | ICD-10-CM | POA: Insufficient documentation

## 2019-09-15 DIAGNOSIS — R030 Elevated blood-pressure reading, without diagnosis of hypertension: Secondary | ICD-10-CM | POA: Insufficient documentation

## 2019-09-15 LAB — BASIC METABOLIC PANEL
Anion gap: 12 (ref 5–15)
BUN: 10 mg/dL (ref 6–20)
CO2: 21 mmol/L — ABNORMAL LOW (ref 22–32)
Calcium: 8.9 mg/dL (ref 8.9–10.3)
Chloride: 107 mmol/L (ref 98–111)
Creatinine, Ser: 0.89 mg/dL (ref 0.44–1.00)
GFR calc Af Amer: >60 mL/min (ref >60)
GFR calc non Af Amer: >60 mL/min (ref >60)
Glucose, Bld: 180 mg/dL — ABNORMAL HIGH (ref 70–99)
Potassium: 3.7 mmol/L (ref 3.5–5.1)
Sodium: 140 mmol/L (ref 135–145)

## 2019-09-15 LAB — BRAIN NATRIURETIC PEPTIDE: B Natriuretic Peptide: 66.2 pg/mL (ref 0.0–100.0)

## 2019-09-15 LAB — CBC
HCT: 47.1 % — ABNORMAL HIGH (ref 36.0–46.0)
Hemoglobin: 15.1 g/dL — ABNORMAL HIGH (ref 12.0–15.0)
MCH: 29.1 pg (ref 26.0–34.0)
MCHC: 32.1 g/dL (ref 30.0–36.0)
MCV: 90.8 fL (ref 80.0–100.0)
Platelets: 218 10*3/uL (ref 150–400)
RBC: 5.19 MIL/uL — ABNORMAL HIGH (ref 3.87–5.11)
RDW: 12.6 % (ref 11.5–15.5)
WBC: 10.2 10*3/uL (ref 4.0–10.5)
nRBC: 0 % (ref 0.0–0.2)

## 2019-09-15 LAB — TROPONIN I (HIGH SENSITIVITY)
Troponin I (High Sensitivity): 16 ng/L (ref <18)
Troponin I (High Sensitivity): 13 ng/L (ref <18)

## 2019-09-15 LAB — SARS CORONAVIRUS 2 BY RT PCR (HOSPITAL ORDER, PERFORMED IN ~~LOC~~ HOSPITAL LAB): SARS Coronavirus 2: NEGATIVE

## 2019-09-15 MED ORDER — ALBUTEROL SULFATE HFA 108 (90 BASE) MCG/ACT IN AERS: 1.0000 | INHALATION_SPRAY | Freq: Four times a day (QID) | RESPIRATORY_TRACT | 0 refills | Status: DC | PRN

## 2019-09-15 MED ORDER — ALBUTEROL SULFATE HFA 108 (90 BASE) MCG/ACT IN AERS
4.0000 | INHALATION_SPRAY | Freq: Once | RESPIRATORY_TRACT | Status: AC
  Administered 2019-09-15: 4 via RESPIRATORY_TRACT
  Filled 2019-09-15: qty 6.7

## 2019-09-15 MED ORDER — LEVOFLOXACIN 750 MG PO TABS: 750.0000 mg | ORAL_TABLET | Freq: Every day | ORAL | 0 refills | Status: AC

## 2019-09-15 MED ORDER — AEROCHAMBER PLUS FLO-VU LARGE MISC
1.0000 | Freq: Once | Status: AC
  Administered 2019-09-15: 1

## 2019-09-15 MED ORDER — BENZONATATE 100 MG PO CAPS
100.0000 mg | ORAL_CAPSULE | Freq: Three times a day (TID) | ORAL | 0 refills | Status: DC
Start: 2019-09-15 — End: 2020-12-07

## 2019-09-15 NOTE — Discharge Instructions (Addendum)
As discussed, your labs were reassuring today.  Your chest x-ray showed pneumonia.  I am sending home with an antibiotic.  Take once a day for 5 days.  Your Covid test is pending.  Results should be available within the next few hours.  Continue to self quarantine until your results are available.  I am also sending you home with cough medication.  Take as prescribed. You may take over the counter ibuprofen or tylenol as needed for your chest pain. Follow-up with PCP within the next week for further evaluation. Return to the ER for new or worsening symptoms.   Please have your blood pressure checked within the next week.

## 2019-09-15 NOTE — ED Notes (Signed)
[phone], Ertha Nabor mother would like an update

## 2019-09-15 NOTE — ED Notes (Signed)
Pts O2 remained at 98 while ambulating around the nurses station and back to her room. The pt also made a comment about stealing her mothers fluid pills and taking them just to get some fluid off of her chest every now and then. She said when she feels like she has a build of fluid, she takes half of one of her mothers fluid pills just to get some of the fluid off of her chest. She did state the reason she only takes half of one pill is because they are prescribed at a high dosage.

## 2019-09-15 NOTE — ED Provider Notes (Signed)
[facility] EMERGENCY DEPARTMENT Provider Note   CSN: [id number] Arrival date & time: 09/15/19  7829     History Chief Complaint  Patient presents with  . Shortness of Breath  . Cough  . Back Pain  . Chest Pain    Julia Dunn is a 49 y.o. female with a past medical history significant for diabetes, depression, hypertension, anemia, and asthma who presents to the ED due to worsening shortness of breath for the past 24 hours. Patient notes she has episodes of shortness of breath monthly, but this most recent episode started last night around 11pm. Shortness of breath associated with a dry cough and chest pain under her left breast. Patient notes chest pain is constant and worse with exertion and when coughing. She has been using her albuterol inhaler with moderate relief. Shortness of breath is worse with exertion.  Denies sick contacts and known Covid exposures.  Denies fever and chills.  Denies tobacco abuse.  Denies history of blood clots, recent surgeries, recent long immobilizations, and hormonal treatments. No recent antibiotic use. No recent hospitalizations.   History obtained from patient and past medical records. No interpreter used during encounter.      Past Medical History:  Diagnosis Date  . Anemia Dx 2013  . Asthma   . Bacterial vaginosis 2008  . Complication of anesthesia    pt states was informed had difficult time awakening following hysterectomy   . Depression   . Depression   . Diabetes mellitus Dx 2005  . Fibroids 09/05/2011   08/08/2011: Anteverted uterus. Diffuse fibroid involvement. Left of midline fibroid with submucosal component. 3.4cm x 2.8cmx 3.4cm.   Marland Kitchen GERD (gastroesophageal reflux disease)   . Gestational diabetes   . H/O shortness of breath    pt states has improved since lossing weight; triggers currently are enviromental pt states can make it to top of stairs w/o having to stop to catch her breath  . Headache(784.0)    migraines    . History of blood transfusion 2013/2014  . History of bronchitis   . Hx of blood clots   . Hypertension Dx 2015  . Kidney infection 2015  . Menorrhagia 09/27/2010  . Pneumonia   . Postpartum depression   . Pregnancy induced hypertension   . Trichomonas     Patient Active Problem List   Diagnosis Date Noted  . Closed displaced bimalleolar fracture of left ankle 01/04/2016  . Abnormal EKG 06/22/2014  . Abnormal CT scan of lung 06/22/2014  . Dyspnea 06/19/2014  . Microscopic hematuria 04/07/2014  . Onychomycosis 05/31/2013  . BI-RADS category 3 mammogram result 03/07/2013  . Vitamin D insufficiency 08/13/2011  . Morbid obesity ([place]) 08/10/2007  . ALLERGIC RHINITIS, SEASONAL 07/14/2006  . DM2 (diabetes mellitus, type 2) 05/28/2006  . DEPRESSIVE DISORDER, NOS 05/28/2006  . HTN (hypertension) 05/28/2006    Past Surgical History:  Procedure Laterality Date  . ABDOMINAL HYSTERECTOMY N/A 11/03/2012   Procedure: HYSTERECTOMY ABDOMINAL;  Surgeon: Ena Dawley, MD;  Location: [place] [facility];  Service: Gynecology;  Laterality: N/A;  . BILATERAL SALPINGECTOMY Bilateral 11/03/2012   Procedure: BILATERAL SALPINGECTOMY;  Surgeon: Ena Dawley, MD;  Location: [place] [facility];  Service: Gynecology;  Laterality: Bilateral;  . BURCH PROCEDURE N/A 11/03/2012   Procedure: BURCH PROCEDURE;  Surgeon: Ena Dawley, MD;  Location: [place] [facility];  Service: Gynecology;  Laterality: N/A;  . CYSTOSCOPY N/A 11/03/2012   Procedure: CYSTOSCOPY;  Surgeon: Ena Dawley, MD;  Location: [facility] [facility];  Service: Gynecology;  Laterality:  N/A;  . DILATION AND CURETTAGE OF UTERUS    . LAPAROSCOPY N/A 11/03/2012   Procedure: LAPAROSCOPY DIAGNOSTIC;  Surgeon: Ena Dawley, MD;  Location: [facility];  Service: Gynecology;  Laterality: N/A;  . ORIF ANKLE FRACTURE Left 01/04/2016   Procedure: OPEN REDUCTION INTERNAL FIXATION (ORIF) LEFT BIMALLEOLAR ANKLE FRACTURE;  Surgeon: Mcarthur Rossetti, MD;  Location: [facility];  Service: Orthopedics;   Laterality: Left;  . TUBAL LIGATION    . WISDOM TOOTH EXTRACTION     3 teeth     OB History    Gravida  6   Para  3   Term  3   Preterm      AB  3   Living  3     SAB      TAB  3   Ectopic      Multiple      Live Births  3           Family History  Problem Relation Age of Onset  . Hypertension Brother   . Stroke Brother   . Sickle cell trait Daughter   . Hypertension Father   . Heart disease Mother   . Rheumatic fever Mother   . Hypertension Mother   . Stroke Mother   . Heart disease Paternal Aunt     Social History   Tobacco Use  . Smoking status: Never Smoker  . Smokeless tobacco: Never Used  Substance Use Topics  . Alcohol use: Yes    Comment: rarely  . Drug use: No    Home Medications Prior to Admission medications   Medication Sig Start Date End Date Taking? Authorizing Provider  albuterol (VENTOLIN HFA) 108 (90 Base) MCG/ACT inhaler Inhale 2 puffs into the lungs every 6 (six) hours as needed for wheezing or shortness of breath. 04/21/19  Yes Fulp, Cammie, MD  amLODipine (NORVASC) 5 MG tablet Take 1 tablet (5 mg total) by mouth daily. 04/21/19  Yes Fulp, Cammie, MD  atorvastatin (LIPITOR) 20 MG tablet Take 1 tablet (20 mg total) by mouth daily after supper. 04/21/19  Yes Fulp, Cammie, MD  diclofenac Sodium (VOLTAREN) 1 % GEL Apply 4 g topically 4 (four) times daily as needed. 03/14/19  Yes Hilts, Legrand Como, MD  FLUoxetine (PROZAC) 10 MG capsule Take 1 capsule (10 mg total) by mouth daily. 04/21/19  Yes Fulp, Cammie, MD  Insulin Glargine (LANTUS SOLOSTAR) 100 UNIT/ML Solostar Pen Inject 12 Units into the skin daily. 04/21/19  Yes Fulp, Cammie, MD  lisinopril-hydrochlorothiazide (ZESTORETIC) 20-12.5 MG tablet TAKE TWO TABLETS BY MOUTH ONCE DAILY Patient taking differently: Take 2 tablets by mouth daily.  04/21/19  Yes Fulp, Cammie, MD  metFORMIN (GLUCOPHAGE-XR) 500 MG 24 hr tablet Take 2 tablets (1,000 mg total) by mouth 2 (two) times daily. 04/21/19   Yes Fulp, Cammie, MD  naproxen (NAPROSYN) 500 MG tablet Take 1 tablet (500 mg total) by mouth 2 (two) times daily with a meal. As needed for pain/headache; take after eating 04/21/19  Yes Fulp, Cammie, MD  naproxen sodium (ANAPROX) 220 MG tablet Take 220 mg by mouth daily as needed (pain).    Yes [provider]  PRESCRIPTION MEDICATION Take 0.5 tablets by mouth See admin instructions. 1/2 tablet of Bumetanide.   Yes [provider]  albuterol (VENTOLIN HFA) 108 (90 Base) MCG/ACT inhaler Inhale 1-2 puffs into the lungs every 6 (six) hours as needed for wheezing or shortness of breath. 09/15/19   Suzy Bouchard, PA-C  benzonatate (TESSALON)  100 MG capsule Take 1 capsule (100 mg total) by mouth every 8 (eight) hours. 09/15/19   Suzy Bouchard, PA-C  gabapentin (NEURONTIN) 300 MG capsule 1 PO q HS, may increase to 1 PO TID if needed. Patient not taking: Reported on 09/15/2019 04/21/19   Fulp, Cammie, MD  Insulin Pen Needle (TRUEPLUS PEN NEEDLES) 32G X 4 MM MISC Use to inject insulin daily. 04/21/19   Fulp, Cammie, MD  levofloxacin (LEVAQUIN) 750 MG tablet Take 1 tablet (750 mg total) by mouth daily for 5 days. 09/15/19 09/20/19  Suzy Bouchard, PA-C  nitroGLYCERIN (NITRO-DUR) 0.1 mg/hr patch Apply 1/4 patch to heel area 12 hours daily 03/14/19   Hilts, Michael, MD  ONE TOUCH ULTRA TEST test strip USE  STRIP TO CHECK GLUCOSE THREE TIMES DAILY BEFORE MEAL(S) 10/23/16   Charlott Rakes, MD    Allergies    Other and Penicillins  Review of Systems   Review of Systems  Constitutional: Negative for chills and fever.  HENT: Positive for congestion.   Respiratory: Positive for cough and shortness of breath.   Cardiovascular: Positive for chest pain. Negative for leg swelling.  Gastrointestinal: Negative for abdominal pain, diarrhea, nausea and vomiting.  Genitourinary: Negative for dysuria.  All other systems reviewed and are negative.   Physical Exam Updated Vital Signs BP  (!) 159/101   Pulse 80   Temp 98.2 F (36.8 C) (Oral)   Resp (!) 22   Ht 5\' 5"  (1.651 m)   Wt 113.4 kg   LMP 06/29/2012   SpO2 97%   BMI 41.60 kg/m   Physical Exam Vitals and nursing note reviewed.  Constitutional:      General: She is not in acute distress.    Appearance: She is not toxic-appearing.     Comments: Coughing during exam  HENT:     Head: Normocephalic.  Eyes:     Pupils: Pupils are equal, round, and reactive to light.  Cardiovascular:     Rate and Rhythm: Normal rate and regular rhythm.     Pulses: Normal pulses.     Heart sounds: Normal heart sounds. No murmur heard.  No friction rub. No gallop.   Pulmonary:     Effort: Pulmonary effort is normal.     Breath sounds: Normal breath sounds.     Comments: Respirations equal and unlabored, patient able to speak in full sentences, lungs clear to auscultation bilaterally Chest:     Comments: Reproducible anterior chest wall tenderness greatest on the left side.  No crepitus or deformity. Abdominal:     General: Abdomen is flat. Bowel sounds are normal. There is no distension.     Palpations: Abdomen is soft.     Tenderness: There is no abdominal tenderness. There is no guarding or rebound.  Musculoskeletal:     Cervical back: Neck supple.     Comments: No lower extremity edema.  Negative Homan sign bilaterally.  No calf tenderness bilaterally.  Skin:    General: Skin is warm and dry.  Neurological:     General: No focal deficit present.     Mental Status: She is alert.  Psychiatric:        Mood and Affect: Mood normal.        Behavior: Behavior normal.     ED Results / Procedures / Treatments   Labs (all labs ordered are listed, but only abnormal results are displayed) Labs Reviewed  BASIC METABOLIC PANEL - Abnormal; Notable for the following components:  Result Value   CO2 21 (*)    Glucose, Bld 180 (*)    All other components within normal limits  CBC - Abnormal; Notable for the following  components:   RBC 5.19 (*)    Hemoglobin 15.1 (*)    HCT 47.1 (*)    All other components within normal limits  SARS CORONAVIRUS 2 BY RT PCR (HOSPITAL ORDER, [place] [facility])  BRAIN NATRIURETIC PEPTIDE  TROPONIN I (HIGH SENSITIVITY)  TROPONIN I (HIGH SENSITIVITY)    EKG None  Radiology DG Chest 2 View  Result Date: 09/15/2019 CLINICAL DATA:  Shortness of breath with chest pain and cough. EXAM: CHEST - 2 VIEW COMPARISON:  01/01/2014 FINDINGS: Lungs are adequately inflated with hazy airspace opacification over the right mid to lower lung likely pneumonia. No effusion. Cardiomediastinal silhouette and remainder the exam is unchanged. IMPRESSION: Hazy airspace process over the right mid to lower lung likely pneumonia. Electronically Signed   By: Marin Olp M.D.   On: 09/15/2019 10:22    Procedures Procedures (including critical care time)  Medications Ordered in ED Medications  albuterol (VENTOLIN HFA) 108 (90 Base) MCG/ACT inhaler 4 puff (4 puffs Inhalation Given 09/15/19 1550)  AeroChamber Plus Flo-Vu Large MISC 1 each (1 each Other Given 09/15/19 1550)    ED Course  I have reviewed the triage vital signs and the nursing notes.  Pertinent labs & imaging results that were available during my care of the patient were reviewed by me and considered in my medical decision making (see chart for details).    MDM Rules/Calculators/A&P                         49 year old female presents to the ED due to shortness of breath, cough, and chest pain for the past day.  Denies sick contacts and known Covid exposures. Denies fever and chills. Upon arrival, patient afebrile, not tachycardic, or hypoxic. Mildly elevated BP at 191/118. Will continue to monitor. Patient in no acute distress and non-toxic appearing. Physical exam reassuring. No lower extremity edema and negative homan sign bilaterally. PERC negative and low risk using wells criteria. Doubt PE/DVT. Of note,  patient's chart notes hx of blood clots, but patient denies any history. Routine labs, troponin, CXR, and EKG ordered at triage. Will add COVID test. Low suspicion for ACS given chest pain is reproducible on exam, suspect MSK etiology. Suspect symptoms related to pneumonia vs. Asthma exacerbation vs. Viral infection.  Will give albuterol treatment here given patient notes it is helped her breathing.  CBC reassuring with no leukocytosis.  Initial troponin normal at 16.  Will obtain delta troponin to rule out ACS. BMP reassuring with normal renal function and no major electrolyte derangements. CXR personally reviewed which demonstrates: IMPRESSION:  Hazy airspace process over the right mid to lower lung likely  pneumonia.   EKG personally reviewed which demonstrates normal sinus rhythm with no signs of acute ischemia.  3:04 PM informed by tech that patient admits to taking mother's diuretic to "pull fluid off" with symptomatic relief.  We will add BNP to rule out congestive heart failure exacerbation even though my suspicion is low given no lower extremity edema.  Patient ambulated in the room and maintained O2 saturation above 98% the entire time without difficulties.  Delta troponin flat.  Doubt ACS.  Suspect MSK etiology given reproducible nature on exam.  BNP normal.  Doubt congestive heart failure.  Suspect symptoms related  to pneumonia given findings on CXR. Will discharge patient with Levaquin given her comorbiditities. Will also discharge with cough medication and refill on albuterol inhaler. Instructed patient to follow-up with PCP within the next week for further evaluation. [facility] wellness number given at discharge. Strict ED precautions discussed with patient. Patient states understanding and agrees to plan. Patient discharged home in no acute distress and stable vitals  Discussed case with Dr. Roslynn Amble who evaluated patient at bedside and agrees with assessment and plan.  Final Clinical  Impression(s) / ED Diagnoses Final diagnoses:  Community acquired pneumonia of right middle lobe of lung  Cough  Shortness of breath    Rx / DC Orders ED Discharge Orders         Ordered    levofloxacin (LEVAQUIN) 750 MG tablet  Daily     Discontinue  Reprint     09/15/19 1650    benzonatate (TESSALON) 100 MG capsule  Every 8 hours     Discontinue  Reprint     09/15/19 1657    albuterol (VENTOLIN HFA) 108 (90 Base) MCG/ACT inhaler  Every 6 hours PRN     Discontinue  Reprint     09/15/19 1657           Suzy Bouchard, PA-C 09/15/19 1704    Lucrezia Starch, MD 09/16/19 1743

## 2019-09-15 NOTE — ED Triage Notes (Signed)
Pt here for eval of cough, shortness of breath when lying down, and L sided chest pain. Sts trying to use inhaler but sts ineffective because cannot hold her breath long enough to take it. Sts she gets an episode like this once a month x several months but this is the worst it has been. This episode of shob began while having sex last night. Has developed nasal congestion within the last hour.

## 2019-09-16 MED FILL — BENZONATATE 100 MG CAPS: 100 | 7 days supply | Qty: 21 | Fill #0

## 2019-09-16 MED FILL — levoFLOXacin 750 MG TABS: 750 | 5 days supply | Qty: 5 | Fill #0

## 2019-09-16 MED FILL — ALBUTEROL SULFATE HFA 108 (: 108 (90 BAS | 25 days supply | Qty: 18 | Fill #0

## 2019-09-29 NOTE — Progress Notes (Deleted)
Patient ID: Julia Dunn, female   DOB: May 03, 1970, 49 y.o.   MRN: [id number]   After ED visit 09/15/2019  From ED note: 49 year old female presents to the ED due to shortness of breath, cough, and chest pain for the past day.  Denies sick contacts and known Covid exposures. Denies fever and chills. Upon arrival, patient afebrile, not tachycardic, or hypoxic. Mildly elevated BP at 191/118. Will continue to monitor. Patient in no acute distress and non-toxic appearing. Physical exam reassuring. No lower extremity edema and negative homan sign bilaterally. PERC negative and low risk using wells criteria. Doubt PE/DVT. Of note, patient's chart notes hx of blood clots, but patient denies any history. Routine labs, troponin, CXR, and EKG ordered at triage. Will add COVID test. Low suspicion for ACS given chest pain is reproducible on exam, suspect MSK etiology. Suspect symptoms related to pneumonia vs. Asthma exacerbation vs. Viral infection.  Will give albuterol treatment here given patient notes it is helped her breathing.  CBC reassuring with no leukocytosis.  Initial troponin normal at 16.  Will obtain delta troponin to rule out ACS. BMP reassuring with normal renal function and no major electrolyte derangements. CXR personally reviewed which demonstrates: IMPRESSION:  Hazy airspace process over the right mid to lower lung likely  pneumonia.   EKG personally reviewed which demonstrates normal sinus rhythm with no signs of acute ischemia.  3:04 PM informed by tech that patient admits to taking mother's diuretic to "pull fluid off" with symptomatic relief.  We will add BNP to rule out congestive heart failure exacerbation even though my suspicion is low given no lower extremity edema.  Patient ambulated in the room and maintained O2 saturation above 98% the entire time without difficulties.  Delta troponin flat.  Doubt ACS.  Suspect MSK etiology given reproducible nature on exam.  BNP normal.  Doubt  congestive heart failure.  Suspect symptoms related to pneumonia given findings on CXR. Will discharge patient with Levaquin given her comorbiditities. Will also discharge with cough medication and refill on albuterol inhaler. Instructed patient to follow-up with PCP within the next week for further evaluation. [facility] wellness number given at discharge. Strict ED precautions discussed with patient. Patient states understanding and agrees to plan. Patient discharged home in no acute distress and stable vitals  Discussed case with Dr. Roslynn Amble who evaluated patient at bedside and agrees with assessment and plan.

## 2019-10-05 ENCOUNTER — Ambulatory Visit: Payer: Self-pay | Admitting: Physician Assistant

## 2019-10-20 ENCOUNTER — Other Ambulatory Visit: Payer: Self-pay

## 2019-10-20 ENCOUNTER — Encounter: Payer: Self-pay | Admitting: Physician Assistant

## 2019-10-20 ENCOUNTER — Ambulatory Visit: Payer: 59 | Attending: Physician Assistant | Admitting: Physician Assistant

## 2019-10-20 ENCOUNTER — Other Ambulatory Visit: Payer: Self-pay | Admitting: Physician Assistant

## 2019-10-20 VITALS — BP 144/84 | HR 78 | Ht 65.0 in | Wt 244.2 lb

## 2019-10-20 DIAGNOSIS — R0602 Shortness of breath: Secondary | ICD-10-CM | POA: Diagnosis not present

## 2019-10-20 DIAGNOSIS — I10 Essential (primary) hypertension: Secondary | ICD-10-CM

## 2019-10-20 DIAGNOSIS — E1165 Type 2 diabetes mellitus with hyperglycemia: Secondary | ICD-10-CM

## 2019-10-20 DIAGNOSIS — R0600 Dyspnea, unspecified: Secondary | ICD-10-CM

## 2019-10-20 DIAGNOSIS — R0609 Other forms of dyspnea: Secondary | ICD-10-CM

## 2019-10-20 DIAGNOSIS — R635 Abnormal weight gain: Secondary | ICD-10-CM

## 2019-10-20 LAB — POCT GLYCOSYLATED HEMOGLOBIN (HGB A1C): HbA1c, POC (controlled diabetic range): 8.3 % — AB (ref 0.0–7.0)

## 2019-10-20 LAB — GLUCOSE, POCT (MANUAL RESULT ENTRY): POC Glucose: 122 mg/dl — AB (ref 70–99)

## 2019-10-20 MED ORDER — ATORVASTATIN CALCIUM 20 MG PO TABS: 20.0000 mg | ORAL_TABLET | Freq: Every day | ORAL | 3 refills | Status: DC

## 2019-10-20 MED ORDER — ALBUTEROL SULFATE HFA 108 (90 BASE) MCG/ACT IN AERS: 2.0000 | INHALATION_SPRAY | Freq: Four times a day (QID) | RESPIRATORY_TRACT | 3 refills | Status: DC | PRN

## 2019-10-20 MED ORDER — AMLODIPINE BESYLATE 5 MG PO TABS: 5.0000 mg | ORAL_TABLET | Freq: Every day | ORAL | 3 refills | Status: DC

## 2019-10-20 MED ORDER — LISINOPRIL-HYDROCHLOROTHIAZIDE 20-12.5 MG PO TABS: 2.0000 | ORAL_TABLET | Freq: Every day | ORAL | 1 refills | Status: DC

## 2019-10-20 MED ORDER — METFORMIN HCL ER 500 MG PO TB24: 1000.0000 mg | ORAL_TABLET | Freq: Two times a day (BID) | ORAL | 1 refills | Status: DC

## 2019-10-20 MED ORDER — LANTUS SOLOSTAR 100 UNIT/ML ~~LOC~~ SOPN: 14.0000 [IU] | PEN_INJECTOR | Freq: Every day | SUBCUTANEOUS | 11 refills | Status: DC

## 2019-10-20 MED ORDER — ALBUTEROL SULFATE HFA 108 (90 BASE) MCG/ACT IN AERS: 1.0000 | INHALATION_SPRAY | Freq: Four times a day (QID) | RESPIRATORY_TRACT | 0 refills | Status: DC | PRN

## 2019-10-20 MED ORDER — METFORMIN HCL ER 500 MG PO TB24: 1000.0000 mg | ORAL_TABLET | Freq: Two times a day (BID) | ORAL | 4 refills | Status: DC

## 2019-10-20 MED FILL — ATORVASTATIN CALCIUM 20 MG: 20 | 30 days supply | Qty: 30 | Fill #0

## 2019-10-20 MED FILL — LISINOPRIL-HCTZ 20-12.5 MG: 20-12.5 | 31 days supply | Qty: 62 | Fill #0

## 2019-10-20 MED FILL — LANTUS SOLOSTAR 100 UNITS/M: 100 | 21 days supply | Qty: 3 | Fill #0

## 2019-10-20 MED FILL — AMLODIPINE BESYLATE 5 MG TA: 5 | 31 days supply | Qty: 31 | Fill #0

## 2019-10-20 NOTE — Progress Notes (Signed)
Julia Dunn, is a 49 y.o. female  XBD:[id number]  STM:[id number]  DOB - 10/02/1970  Subjective:  Chief Complaint and HPI: Julia Dunn is a 49 y.o. female here today bc she feels like her "body is swelling."  She is also having shortness of breath and DOE s/p pneumonia in June 2021.  She has also been having this for over a year.  She said its just getting worse.  (BNP was 66 in June).  She does not check her blood sugar regularly.  She says she feels best when her blood sugars are in the 200-300 range.  No CP.  She feels her hands and legs are swollen.     ROS:   Constitutional:  No f/c, No night sweats, No unexplained weight loss. EENT:  No vision changes, No blurry vision, No hearing changes. No mouth, throat, or ear problems.  Respiratory: No cough, + SOB Cardiac: No CP, no palpitations GI:  No abd pain, No N/V/D. GU: No Urinary s/sx Musculoskeletal: No joint pain Neuro: No headache, no dizziness, no motor weakness.  Skin: No rash Endocrine:  No polydipsia. No polyuria.  Psych: Denies SI/HI  No problems updated.  ALLERGIES: Allergies  Allergen Reactions  . Other Swelling    Patient says she is unsure of caused the swelling but one of the meds given to her on the 8/6 for her surgery had her extreamly swollen   . Penicillins Hives    REACTION: unspecified Has patient had a PCN reaction causing immediate rash, facial/tongue/throat swelling, SOB or lightheadedness with hypotension: No Has patient had a PCN reaction causing severe rash involving mucus membranes or skin necrosis: No Has patient had a PCN reaction that required hospitalization No Has patient had a PCN reaction occurring within the last 10 years: No If all of the above answers are "NO", then may proceed with Cephalosporin use.     PAST MEDICAL HISTORY: Past Medical History:  Diagnosis Date  . Anemia Dx 2013  . Asthma   . Bacterial vaginosis 2008  . Complication of anesthesia    pt states was informed  had difficult time awakening following hysterectomy   . Depression   . Depression   . Diabetes mellitus Dx 2005  . Fibroids 09/05/2011   08/08/2011: Anteverted uterus. Diffuse fibroid involvement. Left of midline fibroid with submucosal component. 3.4cm x 2.8cmx 3.4cm.   Marland Kitchen GERD (gastroesophageal reflux disease)   . Gestational diabetes   . H/O shortness of breath    pt states has improved since lossing weight; triggers currently are enviromental pt states can make it to top of stairs w/o having to stop to catch her breath  . Headache(784.0)    migraines  . History of blood transfusion 2013/2014  . History of bronchitis   . Hx of blood clots   . Hypertension Dx 2015  . Kidney infection 2015  . Menorrhagia 09/27/2010  . Pneumonia   . Postpartum depression   . Pregnancy induced hypertension   . Trichomonas     MEDICATIONS AT HOME: Prior to Admission medications   Medication Sig Start Date End Date Taking? Authorizing Provider  albuterol (VENTOLIN HFA) 108 (90 Base) MCG/ACT inhaler Inhale 2 puffs into the lungs every 6 (six) hours as needed for wheezing or shortness of breath. 10/20/19  Yes Lakindra Wible M, PA-C  albuterol (VENTOLIN HFA) 108 (90 Base) MCG/ACT inhaler Inhale 1-2 puffs into the lungs every 6 (six) hours as needed for wheezing or shortness of breath. 10/20/19  Yes Freeman Caldron M, PA-C  amLODipine (NORVASC) 5 MG tablet Take 1 tablet (5 mg total) by mouth daily. 10/20/19  Yes Freeman Caldron M, PA-C  atorvastatin (LIPITOR) 20 MG tablet Take 1 tablet (20 mg total) by mouth daily after supper. 10/20/19  Yes Zeek Rostron M, PA-C  benzonatate (TESSALON) 100 MG capsule Take 1 capsule (100 mg total) by mouth every 8 (eight) hours. 09/15/19  Yes Aberman, Caroline C, PA-C  diclofenac Sodium (VOLTAREN) 1 % GEL Apply 4 g topically 4 (four) times daily as needed. 03/14/19  Yes Hilts, Legrand Como, MD  FLUoxetine (PROZAC) 10 MG capsule Take 1 capsule (10 mg total) by mouth daily. 04/21/19  Yes  Fulp, Cammie, MD  insulin glargine (LANTUS SOLOSTAR) 100 UNIT/ML Solostar Pen Inject 14 Units into the skin daily. 10/20/19  Yes Raja Liska M, PA-C  Insulin Pen Needle (TRUEPLUS PEN NEEDLES) 32G X 4 MM MISC Use to inject insulin daily. 04/21/19  Yes Fulp, Cammie, MD  lisinopril-hydrochlorothiazide (ZESTORETIC) 20-12.5 MG tablet Take 2 tablets by mouth daily. 10/20/19  Yes Nuri Branca M, PA-C  metFORMIN (GLUCOPHAGE-XR) 500 MG 24 hr tablet Take 2 tablets (1,000 mg total) by mouth 2 (two) times daily. 10/20/19  Yes Javen Hinderliter M, PA-C  naproxen (NAPROSYN) 500 MG tablet Take 1 tablet (500 mg total) by mouth 2 (two) times daily with a meal. As needed for pain/headache; take after eating 04/21/19  Yes Fulp, Cammie, MD  naproxen sodium (ANAPROX) 220 MG tablet Take 220 mg by mouth daily as needed (pain).    Yes [provider]  nitroGLYCERIN (NITRO-DUR) 0.1 mg/hr patch Apply 1/4 patch to heel area 12 hours daily 03/14/19  Yes Hilts, Michael, MD  ONE TOUCH ULTRA TEST test strip USE  STRIP TO CHECK GLUCOSE THREE TIMES DAILY BEFORE MEAL(S) 10/23/16  Yes Charlott Rakes, MD  PRESCRIPTION MEDICATION Take 0.5 tablets by mouth See admin instructions. 1/2 tablet of Bumetanide.   Yes [provider]  gabapentin (NEURONTIN) 300 MG capsule 1 PO q HS, may increase to 1 PO TID if needed. Patient not taking: Reported on 09/15/2019 04/21/19   Fulp, Ander Gaster, MD     Objective:  EXAM:   Vitals:   10/20/19 1558  BP: (!) 144/84  Pulse: 78  SpO2: 98%  Weight: (!) 244 lb 3.2 oz (110.8 kg)  Height: 5\' 5"  (1.651 m)    General appearance : A&OX3. NAD. Non-toxic-appearing HEENT: Atraumatic and Normocephalic.  PERRLA. EOM intact.   Chest/Lungs:  Breathing-non-labored, Good air entry bilaterally, breath sounds normal without rales, rhonchi, or wheezing  CVS: S1 S2 regular, no murmurs, gallops, rubs  No swelling/edema noted Extremities: Bilateral Lower Ext shows no edema, both legs are warm to touch  with = pulse throughout Neurology:  CN II-XII grossly intact, Non focal.   Psych:  TP tangential. J/I poor to fair. Normal speech. Appropriate eye contact and affect.  Skin:  No Rash  Data Review Lab Results  Component Value Date   HGBA1C 8.3 (A) 10/20/2019   HGBA1C 10.4 (A) 04/21/2019   HGBA1C 6.9 (H) 01/02/2016     Assessment & Plan   1. Uncontrolled type 2 diabetes mellitus with hyperglycemia ([place]) Improving but still not controlled-definitely needs to work on diabetic diet and checking blood sugars.  Discussed at length that even though she may "feel better" when her blood sugars are normal, that this is not good for her. Spent >40 mins face to face with her/taking history, redirected, patient education - Glucose (CBG) -  HgB A1c - Comprehensive metabolic panel - metFORMIN (GLUCOPHAGE-XR) 500 MG 24 hr tablet; Take 2 tablets (1,000 mg total) by mouth 2 (two) times daily.  Dispense: 360 each; Refill: 4 - insulin glargine (LANTUS SOLOSTAR) 100 UNIT/ML Solostar Pen; Inject 14 Units into the skin daily.  Dispense: 15 mL; Refill: 11 - atorvastatin (LIPITOR) 20 MG tablet; Take 1 tablet (20 mg total) by mouth daily after supper.  Dispense: 30 tablet; Refill: 3 - Ambulatory referral to Cardiology  2. Essential hypertension Not at goal.  We have discussed target BP range and blood pressure goal. I have advised patient to check BP regularly and to call us back or report to clinic if the numbers are consistently higher than 140/90. We discussed the importance of compliance with medical therapy and DASH diet recommended, consequences of uncontrolled hypertension discussed.  - Comprehensive metabolic panel - lisinopril-hydrochlorothiazide (ZESTORETIC) 20-12.5 MG tablet; Take 2 tablets by mouth daily.  Dispense: 180 tablet; Refill: 1 - amLODipine (NORVASC) 5 MG tablet; Take 1 tablet (5 mg total) by mouth daily.  Dispense: 90 tablet; Refill: 3 - Ambulatory referral to Cardiology  3. SOB  (shortness of breath) on exertion - albuterol (VENTOLIN HFA) 108 (90 Base) MCG/ACT inhaler; Inhale 2 puffs into the lungs every 6 (six) hours as needed for wheezing or shortness of breath.  Dispense: 18 g; Refill: 3 - Ambulatory referral to Pulmonology  4. DOE (dyspnea on exertion) - Comprehensive metabolic panel - CBC with Differential/Platelet - Ambulatory referral to Cardiology-for cardiac RS due to comorbidities - Ambulatory referral to Pulmonology  5. Weight gain Says she is not eating more/exercising less-this is defini - TSH     Patient have been counseled extensively about nutrition and exercise  Return in about 3 months (around 01/20/2020) for PCP;  chronic conditions.  The patient was given clear instructions to go to ER or return to medical center if symptoms don't improve, worsen or new problems develop. The patient verbalized understanding. The patient was told to call to get lab results if they haven't heard anything in the next week.     Freeman Caldron, PA-C [facility] and [place] [place], [place]   10/20/2019, 4:19 PMPatient ID: Theda Sers, female   DOB: 1970-11-06, 49 y.o.   MRN: [id number]

## 2019-10-21 LAB — CBC WITH DIFFERENTIAL/PLATELET
Basophils Absolute: 0.1 10*3/uL (ref 0.0–0.2)
Basos: 1 %
EOS (ABSOLUTE): 0.1 10*3/uL (ref 0.0–0.4)
Eos: 2 %
Hematocrit: 47 % — ABNORMAL HIGH (ref 34.0–46.6)
Hemoglobin: 16 g/dL — ABNORMAL HIGH (ref 11.1–15.9)
Immature Grans (Abs): 0 10*3/uL (ref 0.0–0.1)
Immature Granulocytes: 0 %
Lymphocytes Absolute: 3.1 10*3/uL (ref 0.7–3.1)
Lymphs: 42 %
MCH: 29.7 pg (ref 26.6–33.0)
MCHC: 34 g/dL (ref 31.5–35.7)
MCV: 87 fL (ref 79–97)
Monocytes Absolute: 0.7 10*3/uL (ref 0.1–0.9)
Monocytes: 10 %
Neutrophils Absolute: 3.3 10*3/uL (ref 1.4–7.0)
Neutrophils: 45 %
Platelets: 214 10*3/uL (ref 150–450)
RBC: 5.38 x10E6/uL — ABNORMAL HIGH (ref 3.77–5.28)
RDW: 13 % (ref 11.7–15.4)
WBC: 7.3 10*3/uL (ref 3.4–10.8)

## 2019-10-21 LAB — COMPREHENSIVE METABOLIC PANEL
ALT: 15 IU/L (ref 0–32)
AST: 13 IU/L (ref 0–40)
Albumin/Globulin Ratio: 1.6 (ref 1.2–2.2)
Albumin: 4.6 g/dL (ref 3.8–4.8)
Alkaline Phosphatase: 81 IU/L (ref 48–121)
BUN/Creatinine Ratio: 15 (ref 9–23)
BUN: 14 mg/dL (ref 6–24)
Bilirubin Total: 0.4 mg/dL (ref 0.0–1.2)
CO2: 27 mmol/L (ref 20–29)
Calcium: 10.1 mg/dL (ref 8.7–10.2)
Chloride: 103 mmol/L (ref 96–106)
Creatinine, Ser: 0.93 mg/dL (ref 0.57–1.00)
GFR calc Af Amer: 84 mL/min/{1.73_m2} (ref >59)
GFR calc non Af Amer: 73 mL/min/{1.73_m2} (ref >59)
Globulin, Total: 2.9 g/dL (ref 1.5–4.5)
Glucose: 113 mg/dL — ABNORMAL HIGH (ref 65–99)
Potassium: 4 mmol/L (ref 3.5–5.2)
Sodium: 144 mmol/L (ref 134–144)
Total Protein: 7.5 g/dL (ref 6.0–8.5)

## 2019-10-21 LAB — TSH: TSH: 0.861 u[IU]/mL (ref 0.450–4.500)

## 2019-10-24 ENCOUNTER — Ambulatory Visit (INDEPENDENT_AMBULATORY_CARE_PROVIDER_SITE_OTHER): Payer: 59 | Admitting: Cardiovascular Disease

## 2019-10-24 ENCOUNTER — Encounter: Payer: Self-pay | Admitting: Cardiovascular Disease

## 2019-10-24 ENCOUNTER — Other Ambulatory Visit: Payer: Self-pay

## 2019-10-24 VITALS — BP 138/72 | HR 62 | Ht 65.0 in | Wt 243.4 lb

## 2019-10-24 DIAGNOSIS — R079 Chest pain, unspecified: Secondary | ICD-10-CM

## 2019-10-24 DIAGNOSIS — R072 Precordial pain: Secondary | ICD-10-CM | POA: Diagnosis not present

## 2019-10-24 DIAGNOSIS — R0609 Other forms of dyspnea: Secondary | ICD-10-CM

## 2019-10-24 DIAGNOSIS — R0600 Dyspnea, unspecified: Secondary | ICD-10-CM

## 2019-10-24 MED ORDER — FUROSEMIDE 20 MG PO TABS
20.0000 mg | ORAL_TABLET | Freq: Every day | ORAL | 3 refills | Status: DC
Start: 2019-10-24 — End: 2020-10-08

## 2019-10-24 MED ORDER — METOPROLOL TARTRATE 50 MG PO TABS
ORAL_TABLET | ORAL | 0 refills | Status: DC
Start: 2019-10-24 — End: 2019-12-09

## 2019-10-24 MED FILL — FUROSEMIDE 20 MG TABS: 20 | 30 days supply | Qty: 30 | Fill #0

## 2019-10-24 MED FILL — METOPROLOL TARTRATE 50 MG T: 50 | 2 days supply | Qty: 2 | Fill #0

## 2019-10-24 NOTE — Patient Instructions (Addendum)
Medication Instructions:  Your physician has recommended you make the following change in your medication:  1.) start furosemide (Lasix) 20 mg --take one tablet by mouth once a day  *If you need a refill on your cardiac medications before your next appointment, please call your pharmacy*   Lab Work: Today: BMET   Testing/Procedures: Your physician has requested that you have an echocardiogram. Echocardiography is a painless test that uses sound waves to create images of your heart. It provides your doctor with information about the size and shape of your heart and how well your heart's chambers and valves are working. This procedure takes approximately one hour. There are no restrictions for this procedure.  Cardiac Ct - see attached instruction letter.   Follow-Up: At  [facility], you and your health needs are our priority.  As part of our continuing mission to provide you with exceptional heart care, we have created designated Provider Care Teams.  These Care Teams include your primary Cardiologist (physician) and Advanced Practice Providers (APPs -  Physician Assistants and Nurse Practitioners) who all work together to provide you with the care you need, when you need it.  We recommend signing up for the patient portal called "MyChart".  Sign up information is provided on this After Visit Summary.  MyChart is used to connect with patients for Virtual Visits (Telemedicine).  Patients are able to view lab/test results, encounter notes, upcoming appointments, etc.  Non-urgent messages can be sent to your provider as well.   To learn more about what you can do with MyChart, go to [email or web address].    Your next appointment:   6-8 week(s)  The format for your next appointment:   In Person  Provider:   You may see Dr. Lauree Chandler or one of the following Advanced Practice Providers on your designated Care Team:    Melina Copa, PA-C  Ermalinda Barrios, PA-C    Other  Instructions  Your cardiac CT will be scheduled at one of the below locations:   [facility] [address] [place], [place] [address] [phone]  Please arrive at the [facility] main entrance of [facility] 30 minutes prior to test start time. Proceed to the [facility] Radiology Department (first floor) to check-in and test prep.  Please follow these instructions carefully (unless otherwise directed):   On the Night Before the Test: . Be sure to Drink plenty of water. . Do not consume any caffeinated/decaffeinated beverages or chocolate 12 hours prior to your test. Do not take any antihistamines 12 hours prior to your test.  On the Day of the Test: . Drink plenty of water. Do not drink any water within one hour of the test. . Do not eat any food 4 hours prior to the test. . You may take your regular medications prior to the test.  . Take metoprolol (Lopressor) two hours prior to test. . HOLD Furosemide/Hydrochlorothiazide morning of the test. . FEMALES- please wear underwire-free bra if available       After the Test: . Drink plenty of water. . After receiving IV contrast, you may experience a mild flushed feeling. This is normal. . On occasion, you may experience a mild rash up to 24 hours after the test. This is not dangerous. If this occurs, you can take Benadryl 25 mg and increase your fluid intake. . If you experience trouble breathing, this can be serious. If it is severe call 911 IMMEDIATELY. If it is mild, please call  our office. . If you take any of these medications: Glipizide/Metformin, Avandament, Glucavance, please do not take 48 hours after completing test unless otherwise instructed.   Once we have confirmed authorization from your insurance company, we will call you to set up a date and time for your test. Based on how quickly your insurance processes prior authorizations requests, please allow up to 4 weeks to be contacted for scheduling  your Cardiac CT appointment. Be advised that routine Cardiac CT appointments could be scheduled as many as 8 weeks after your provider has ordered it.  For non-scheduling related questions, please contact the cardiac imaging nurse navigator should you have any questions/concerns: Marchia Bond, Cardiac Imaging Nurse Navigator Burley Saver, Interim Cardiac Imaging Nurse [place] [facility] Direct Office Dial: [phone]   For scheduling needs, including cancellations and rescheduling, please call Vivien Rota at [phone], option 3.

## 2019-10-24 NOTE — Progress Notes (Signed)
Chief Complaint  Patient presents with  . New Patient (Initial Visit)    HTN      History of Present Illness: 49 yo female with history of asthma, depression, diabetes mellitus, GERD and HTN who is here today as a new consult, referred by Dr. Chapman Fitch Freeman Caldron, PA-C), for the evaluation of lower extremity swelling. She had pneumonia in June 2021. BNP 66. She describes dyspnea at rest and with exertion. Also with exertional chest pain located over her left chest wall. She feels that she has been retaining fluid and describes mild LE edema. Never smoker.   Primary Care Physician: Antony Blackbird, MD   Past Medical History:  Diagnosis Date  . Anemia Dx 2013  . Asthma   . Bacterial vaginosis 2008  . Complication of anesthesia    pt states was informed had difficult time awakening following hysterectomy   . Depression   . Depression   . Diabetes mellitus Dx 2005  . Fibroids 09/05/2011   08/08/2011: Anteverted uterus. Diffuse fibroid involvement. Left of midline fibroid with submucosal component. 3.4cm x 2.8cmx 3.4cm.   Marland Kitchen GERD (gastroesophageal reflux disease)   . Gestational diabetes   . H/O shortness of breath    pt states has improved since lossing weight; triggers currently are enviromental pt states can make it to top of stairs w/o having to stop to catch her breath  . Headache(784.0)    migraines  . History of blood transfusion 2013/2014  . History of bronchitis   . Hx of blood clots   . Hypertension Dx 2015  . Kidney infection 2015  . Menorrhagia 09/27/2010  . Pneumonia   . Postpartum depression   . Pregnancy induced hypertension   . Trichomonas     Past Surgical History:  Procedure Laterality Date  . ABDOMINAL HYSTERECTOMY N/A 11/03/2012   Procedure: HYSTERECTOMY ABDOMINAL;  Surgeon: Ena Dawley, MD;  Location: [facility] [place] [facility];  Service: Gynecology;  Laterality: N/A;  . BILATERAL SALPINGECTOMY Bilateral 11/03/2012   Procedure: BILATERAL SALPINGECTOMY;  Surgeon: Ena Dawley, MD;  Location: [facility];  Service: Gynecology;  Laterality: Bilateral;  . BURCH PROCEDURE N/A 11/03/2012   Procedure: BURCH PROCEDURE;  Surgeon: Ena Dawley, MD;  Location: [facility];  Service: Gynecology;  Laterality: N/A;  . CYSTOSCOPY N/A 11/03/2012   Procedure: CYSTOSCOPY;  Surgeon: Ena Dawley, MD;  Location: [facility];  Service: Gynecology;  Laterality: N/A;  . DILATION AND CURETTAGE OF UTERUS    . LAPAROSCOPY N/A 11/03/2012   Procedure: LAPAROSCOPY DIAGNOSTIC;  Surgeon: Ena Dawley, MD;  Location: [facility];  Service: Gynecology;  Laterality: N/A;  . ORIF ANKLE FRACTURE Left 01/04/2016   Procedure: OPEN REDUCTION INTERNAL FIXATION (ORIF) LEFT BIMALLEOLAR ANKLE FRACTURE;  Surgeon: Mcarthur Rossetti, MD;  Location: [facility];  Service: Orthopedics;  Laterality: Left;  . TUBAL LIGATION    . WISDOM TOOTH EXTRACTION     3 teeth    Current Outpatient Medications  Medication Sig Dispense Refill  . albuterol (VENTOLIN HFA) 108 (90 Base) MCG/ACT inhaler Inhale 2 puffs into the lungs every 6 (six) hours as needed for wheezing or shortness of breath. 18 g 3  . albuterol (VENTOLIN HFA) 108 (90 Base) MCG/ACT inhaler Inhale 1-2 puffs into the lungs every 6 (six) hours as needed for wheezing or shortness of breath. 18 g 0  . amLODipine (NORVASC) 5 MG tablet Take 1 tablet (5 mg total) by mouth daily. 90 tablet 3  . atorvastatin (LIPITOR) 20 MG tablet Take 1 tablet (  20 mg total) by mouth daily after supper. 30 tablet 3  . benzonatate (TESSALON) 100 MG capsule Take 1 capsule (100 mg total) by mouth every 8 (eight) hours. 21 capsule 0  . diclofenac Sodium (VOLTAREN) 1 % GEL Apply 4 g topically 4 (four) times daily as needed. 500 g 6  . FLUoxetine (PROZAC) 10 MG capsule Take 1 capsule (10 mg total) by mouth daily. 60 capsule 3  . gabapentin (NEURONTIN) 300 MG capsule 1 PO q HS, may increase to 1 PO TID if needed. 90 capsule 3  . insulin glargine (LANTUS SOLOSTAR) 100 UNIT/ML Solostar Pen Inject 14  Units into the skin daily. 15 mL 11  . Insulin Pen Needle (TRUEPLUS PEN NEEDLES) 32G X 4 MM MISC Use to inject insulin daily. 100 each 11  . lisinopril-hydrochlorothiazide (ZESTORETIC) 20-12.5 MG tablet Take 2 tablets by mouth daily. 180 tablet 1  . metFORMIN (GLUCOPHAGE-XR) 500 MG 24 hr tablet Take 2 tablets (1,000 mg total) by mouth 2 (two) times daily. 360 tablet 1  . naproxen (NAPROSYN) 500 MG tablet Take 1 tablet (500 mg total) by mouth 2 (two) times daily with a meal. As needed for pain/headache; take after eating 60 tablet 2  . naproxen sodium (ANAPROX) 220 MG tablet Take 220 mg by mouth daily as needed (pain).     . nitroGLYCERIN (NITRO-DUR) 0.1 mg/hr patch Apply 1/4 patch to heel area 12 hours daily 30 patch 3  . ONE TOUCH ULTRA TEST test strip USE  STRIP TO CHECK GLUCOSE THREE TIMES DAILY BEFORE MEAL(S) 100 each 5  . PRESCRIPTION MEDICATION Take 0.5 tablets by mouth See admin instructions. 1/2 tablet of Bumetanide.    . furosemide (LASIX) 20 MG tablet Take 1 tablet (20 mg total) by mouth daily. 90 tablet 3  . metoprolol tartrate (LOPRESSOR) 50 MG tablet Take one tablet 2 hours prior to cardiac ct scan.  Take second tablet with you. 2 tablet 0   No current facility-administered medications for this visit.    Allergies  Allergen Reactions  . Other Swelling    Patient says she is unsure of caused the swelling but one of the meds given to her on the 8/6 for her surgery had her extreamly swollen   . Penicillins Hives    REACTION: unspecified Has patient had a PCN reaction causing immediate rash, facial/tongue/throat swelling, SOB or lightheadedness with hypotension: No Has patient had a PCN reaction causing severe rash involving mucus membranes or skin necrosis: No Has patient had a PCN reaction that required hospitalization No Has patient had a PCN reaction occurring within the last 10 years: No If all of the above answers are "NO", then may proceed with Cephalosporin use.      Social History   Socioeconomic History  . Marital status: Single    Spouse name: Not on file  . Number of children: 3  . Years of education: Not on file  . Highest education level: Not on file  Occupational History  . Occupation: Unemployed  Tobacco Use  . Smoking status: Never Smoker  . Smokeless tobacco: Never Used  Substance and Sexual Activity  . Alcohol use: Yes    Comment: rarely  . Drug use: No  . Sexual activity: Yes    Birth control/protection: Surgical    Comment: hysterectomy  Other Topics Concern  . Not on file  Social History Narrative  . Not on file   Social Determinants of Health   Financial Resource Strain:   .  Difficulty of Paying Living Expenses:   Food Insecurity:   . Worried About Charity fundraiser in the Last Year:   . Arboriculturist in the Last Year:   Transportation Needs:   . Film/video editor (Medical):   Marland Kitchen Lack of Transportation (Non-Medical):   Physical Activity:   . Days of Exercise per Week:   . Minutes of Exercise per Session:   Stress:   . Feeling of Stress :   Social Connections:   . Frequency of Communication with Friends and Family:   . Frequency of Social Gatherings with Friends and Family:   . Attends Religious Services:   . Active Member of Clubs or Organizations:   . Attends Archivist Meetings:   Marland Kitchen Marital Status:   Intimate Partner Violence:   . Fear of Current or Ex-Partner:   . Emotionally Abused:   Marland Kitchen Physically Abused:   . Sexually Abused:     Family History  Problem Relation Age of Onset  . Hypertension Brother   . Stroke Brother   . Sickle cell trait Daughter   . Hypertension Father   . Heart disease Mother   . Rheumatic fever Mother   . Hypertension Mother   . Stroke Mother   . CAD Mother   . Heart disease Paternal Aunt     Review of Systems:  As stated in the HPI and otherwise negative.   BP (!) 138/72   Pulse 62   Ht 5\' 5"  (1.651 m)   Wt (!) 243 lb 6.4 oz (110.4 kg)   LMP  06/29/2012   SpO2 97%   BMI 40.50 kg/m   Physical Examination: General: Well developed, well nourished, NAD  HEENT: OP clear, mucus membranes moist  SKIN: warm, dry. No rashes. Neuro: No focal deficits  Musculoskeletal: Muscle strength 5/5 all ext  Psychiatric: Mood and affect normal  Neck: No JVD, no carotid bruits, no thyromegaly, no lymphadenopathy.  Lungs:Clear bilaterally, no wheezes, rhonci, crackles Cardiovascular: Regular rate and rhythm. No murmurs, gallops or rubs. Abdomen:Soft. Bowel sounds present. Non-tender.  Extremities: No lower extremity edema. Pulses are 2 + in the bilateral DP/PT.  EKG:  EKG is ordered today. The ekg ordered today demonstrates Sinus  Recent Labs: 09/15/2019: B Natriuretic Peptide 66.2 10/20/2019: ALT 15; BUN 14; Creatinine, Ser 0.93; Hemoglobin 16.0; Platelets 214; Potassium 4.0; Sodium 144; TSH 0.861   Lipid Panel    Component Value Date/Time   CHOL 141 08/31/2015 1027   TRIG 117 08/31/2015 1027   HDL 49 08/31/2015 1027   CHOLHDL 2.9 08/31/2015 1027   VLDL 23 08/31/2015 1027   LDLCALC 69 08/31/2015 1027     Wt Readings from Last 3 Encounters:  10/24/19 (!) 243 lb 6.4 oz (110.4 kg)  10/20/19 (!) 244 lb 3.2 oz (110.8 kg)  09/15/19 250 lb (113.4 kg)    Assessment and Plan:   1. Dyspnea/Chest pain/CHF: Will arrange an echo to assess LV function, exclude structural heart disease. Will arrange gated cardiac CTA to exclude CAD. Will start Lasix 20 mg daily. BMET today.   Current medicines are reviewed at length with the patient today.  The patient does not have concerns regarding medicines.  The following changes have been made:  no change  Labs/ tests ordered today include:   Orders Placed This Encounter  Procedures  . CT CORONARY MORPH W/CTA COR W/SCORE W/CA W/CM &/OR WO/CM  . CT CORONARY FRACTIONAL FLOW RESERVE DATA PREP  . CT CORONARY  FRACTIONAL FLOW RESERVE FLUID ANALYSIS  . Basic metabolic panel  . EKG 12-Lead  .  ECHOCARDIOGRAM COMPLETE     Disposition:   FU with me in 4 weeks   Signed, Lauree Chandler, MD 10/24/2019 9:47 AM    [facility] [place], [place], [place]  [address] Phone: [phone]; Fax: [phone]

## 2019-10-25 ENCOUNTER — Other Ambulatory Visit (HOSPITAL_COMMUNITY)
Admission: RE | Admit: 2019-10-25 | Discharge: 2019-10-25 | Disposition: A | Discharge status: Home / Self Care | Payer: 59 | Source: Ambulatory Visit | Attending: Nurse Practitioner | Admitting: Nurse Practitioner

## 2019-10-25 ENCOUNTER — Ambulatory Visit (HOSPITAL_BASED_OUTPATIENT_CLINIC_OR_DEPARTMENT_OTHER): Payer: 59 | Admitting: Nurse Practitioner

## 2019-10-25 ENCOUNTER — Encounter: Payer: Self-pay | Admitting: Nurse Practitioner

## 2019-10-25 ENCOUNTER — Ambulatory Visit: Payer: 59

## 2019-10-25 DIAGNOSIS — B009 Herpesviral infection, unspecified: Secondary | ICD-10-CM

## 2019-10-25 DIAGNOSIS — N760 Acute vaginitis: Secondary | ICD-10-CM | POA: Diagnosis not present

## 2019-10-25 LAB — POCT URINALYSIS DIP (CLINITEK)
Bilirubin, UA: NEGATIVE
Blood, UA: NEGATIVE
Glucose, UA: NEGATIVE mg/dL
Ketones, POC UA: NEGATIVE mg/dL
Leukocytes, UA: NEGATIVE
Nitrite, UA: NEGATIVE
POC PROTEIN,UA: NEGATIVE
Spec Grav, UA: >=1.03 — AB (ref 1.010–1.025)
Urobilinogen, UA: 0.2 E.U./dL
pH, UA: 5 (ref 5.0–8.0)

## 2019-10-25 MED ORDER — VALACYCLOVIR HCL 1 G PO TABS: 1000.0000 mg | ORAL_TABLET | Freq: Two times a day (BID) | ORAL | 0 refills | Status: DC

## 2019-10-25 MED FILL — valACYclovir HCL 1 GM TABS: 1 | 10 days supply | Qty: 20 | Fill #0

## 2019-10-25 NOTE — Progress Notes (Signed)
Virtual Visit via Telephone Note Due to national recommendations of social distancing due to [place] 19, telehealth visit is felt to be most appropriate for this patient at this time.  I discussed the limitations, risks, security and privacy concerns of performing an evaluation and management service by telephone and the availability of in person appointments. I also discussed with the patient that there may be a patient responsible charge related to this service. The patient expressed understanding and agreed to proceed.    I connected with Julia Dunn on 10/25/19  at  10:50 AM EDT  EDT by telephone and verified that I am speaking with the correct person using two identifiers.   Consent I discussed the limitations, risks, security and privacy concerns of performing an evaluation and management service by telephone and the availability of in person appointments. I also discussed with the patient that there may be a patient responsible charge related to this service. The patient expressed understanding and agreed to proceed.   Location of Patient: Private Residence   Location of Provider: [place] participating in Telemedicine visit: Geryl Rankins FNP-BC [place]    History of Present Illness: Telemedicine visit for: Vaginitis  Endorses Vaginal odor with copious discharge and intense itching. Also has a painful "pimple" on the side of her labia. 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 Diagnosed 06-24-2018. Will try her on antiviral agent today. Will obtain vaginal swab for lab testing.   Past Medical History:  Diagnosis Date  . Anemia Dx 2013  . Asthma   . Bacterial vaginosis 2008  . Complication of anesthesia    pt states was informed had difficult time awakening following hysterectomy   . Depression   . Depression   . Diabetes mellitus Dx 2005  . Fibroids 09/05/2011   08/08/2011: Anteverted uterus. Diffuse fibroid  involvement. Left of midline fibroid with submucosal component. 3.4cm x 2.8cmx 3.4cm.   Marland Kitchen GERD (gastroesophageal reflux disease)   . Gestational diabetes   . H/O shortness of breath    pt states has improved since lossing weight; triggers currently are enviromental pt states can make it to top of stairs w/o having to stop to catch her breath  . Headache(784.0)    migraines  . History of blood transfusion 2013/2014  . History of bronchitis   . Hx of blood clots   . Hypertension Dx 2015  . Kidney infection 2015  . Menorrhagia 09/27/2010  . Pneumonia   . Postpartum depression   . Pregnancy induced hypertension   . Trichomonas     Past Surgical History:  Procedure Laterality Date  . ABDOMINAL HYSTERECTOMY N/A 11/03/2012   Procedure: HYSTERECTOMY ABDOMINAL;  Surgeon: Ena Dawley, MD;  Location: [facility];  Service: Gynecology;  Laterality: N/A;  . BILATERAL SALPINGECTOMY Bilateral 11/03/2012   Procedure: BILATERAL SALPINGECTOMY;  Surgeon: Ena Dawley, MD;  Location: [facility];  Service: Gynecology;  Laterality: Bilateral;  . BURCH PROCEDURE N/A 11/03/2012   Procedure: BURCH PROCEDURE;  Surgeon: Ena Dawley, MD;  Location: [facility];  Service: Gynecology;  Laterality: N/A;  . CYSTOSCOPY N/A 11/03/2012   Procedure: CYSTOSCOPY;  Surgeon: Ena Dawley, MD;  Location: [facility];  Service: Gynecology;  Laterality: N/A;  . DILATION AND CURETTAGE OF UTERUS    . LAPAROSCOPY N/A 11/03/2012   Procedure: LAPAROSCOPY DIAGNOSTIC;  Surgeon: Ena Dawley, MD;  Location: [facility];  Service: Gynecology;  Laterality: N/A;  . ORIF ANKLE FRACTURE Left 01/04/2016  Procedure: OPEN REDUCTION INTERNAL FIXATION (ORIF) LEFT BIMALLEOLAR ANKLE FRACTURE;  Surgeon: Mcarthur Rossetti, MD;  Location: [facility];  Service: Orthopedics;  Laterality: Left;  . TUBAL LIGATION    . WISDOM TOOTH EXTRACTION     3 teeth    Family History  Problem Relation Age of Onset  . Hypertension Brother   . Stroke Brother   . Sickle cell  trait Daughter   . Hypertension Father   . Heart disease Mother   . Rheumatic fever Mother   . Hypertension Mother   . Stroke Mother   . CAD Mother   . Heart disease Paternal Aunt     Social History   Socioeconomic History  . Marital status: Single    Spouse name: Not on file  . Number of children: 3  . Years of education: Not on file  . Highest education level: Not on file  Occupational History  . Occupation: Unemployed  Tobacco Use  . Smoking status: Never Smoker  . Smokeless tobacco: Never Used  Substance and Sexual Activity  . Alcohol use: Yes    Comment: rarely  . Drug use: No  . Sexual activity: Yes    Birth control/protection: Surgical    Comment: hysterectomy  Other Topics Concern  . Not on file  Social History Narrative  . Not on file   Social Determinants of Health   Financial Resource Strain:   . Difficulty of Paying Living Expenses:   Food Insecurity:   . Worried About Charity fundraiser in the Last Year:   . Arboriculturist in the Last Year:   Transportation Needs:   . Film/video editor (Medical):   Marland Kitchen Lack of Transportation (Non-Medical):   Physical Activity:   . Days of Exercise per Week:   . Minutes of Exercise per Session:   Stress:   . Feeling of Stress :   Social Connections:   . Frequency of Communication with Friends and Family:   . Frequency of Social Gatherings with Friends and Family:   . Attends Religious Services:   . Active Member of Clubs or Organizations:   . Attends Archivist Meetings:   Marland Kitchen Marital Status:      Observations/Objective: Awake, alert and oriented x 3   Review of Systems  Constitutional: Negative for fever, malaise/fatigue and weight loss.  HENT: Negative.  Negative for nosebleeds.   Eyes: Negative.  Negative for blurred vision, double vision and photophobia.  Respiratory: Negative.  Negative for cough and shortness of breath.   Cardiovascular: Negative.  Negative for chest pain, palpitations  and leg swelling.  Gastrointestinal: Negative.  Negative for heartburn, nausea and vomiting.  Genitourinary:       SEE HPI  Musculoskeletal: Negative.  Negative for myalgias.  Neurological: Negative.  Negative for dizziness, focal weakness, seizures and headaches.  Psychiatric/Behavioral: Negative.  Negative for suicidal ideas.    Assessment and Plan: Julia Dunn was seen today for vaginal bumps.  Diagnoses and all orders for this visit:  Acute vaginitis -     POCT URINALYSIS DIP (CLINITEK) -     Cervicovaginal ancillary only  HSV-2 (herpes simplex virus 2) infection -     valACYclovir (VALTREX) 1000 MG tablet; Take 1 tablet (1,000 mg total) by mouth 2 (two) times daily.     Follow Up Instructions Return if symptoms worsen or fail to improve.     I discussed the assessment and treatment plan with the patient. The patient was provided  an opportunity to ask questions and all were answered. The patient agreed with the plan and demonstrated an understanding of the instructions.   The patient was advised to call back or seek an in-person evaluation if the symptoms worsen or if the condition fails to improve as anticipated.  I provided 15 minutes of non-face-to-face time during this encounter including median intraservice time, reviewing previous notes, labs, imaging, medications and explaining diagnosis and management.  Gildardo Pounds, FNP-BC

## 2019-10-26 LAB — CERVICOVAGINAL ANCILLARY ONLY
Bacterial Vaginitis (gardnerella): POSITIVE — AB
Candida Glabrata: NEGATIVE
Candida Vaginitis: NEGATIVE
Chlamydia: NEGATIVE
Comment: NEGATIVE
Comment: NEGATIVE
Comment: NEGATIVE
Comment: NEGATIVE
Comment: NEGATIVE
Comment: NORMAL
Neisseria Gonorrhea: NEGATIVE
Trichomonas: NEGATIVE

## 2019-10-28 ENCOUNTER — Encounter: Payer: Self-pay | Admitting: Nurse Practitioner

## 2019-10-28 ENCOUNTER — Other Ambulatory Visit: Payer: Self-pay | Admitting: Nurse Practitioner

## 2019-10-28 MED ORDER — METRONIDAZOLE 500 MG PO TABS: 500.0000 mg | ORAL_TABLET | Freq: Two times a day (BID) | ORAL | 0 refills | Status: AC

## 2019-10-28 MED FILL — metroNIDAZOLE 500 MG TABS: 500 | 7 days supply | Qty: 14 | Fill #0

## 2019-11-09 ENCOUNTER — Ambulatory Visit (HOSPITAL_COMMUNITY): Payer: 59 | Attending: Cardiovascular Disease

## 2019-11-09 ENCOUNTER — Other Ambulatory Visit: Payer: Self-pay

## 2019-11-09 ENCOUNTER — Encounter (HOSPITAL_COMMUNITY): Payer: Self-pay

## 2019-11-10 ENCOUNTER — Telehealth: Payer: Self-pay | Admitting: *Deleted

## 2019-11-10 NOTE — Telephone Encounter (Signed)
Left message (DPR) to let her know she will need bmet same day she comes for echo (8/24) in preparation for cardiac cta.

## 2019-11-15 ENCOUNTER — Telehealth: Payer: Self-pay | Admitting: Nurse Practitioner

## 2019-11-15 NOTE — Telephone Encounter (Signed)
Called to discuss with Julia Dunn about Covid symptoms and the use of casirivimab/imdevimab, a combination monoclonal antibody infusion for those with mild to moderate Covid symptoms and at a high risk of hospitalization.     Pt is qualified for this infusion at the [facility] infusion center due to co-morbid conditions (BMI > 25, hypertension, diabetes).   Will need to discuss further to determine eligibility (symptom onset). Per message patient may be at day 10 of cough and congestion on 11/15/19.   Unable to reach. Voicemail left and Mychart sent.      Patient Active Problem List   Diagnosis Date Noted  . Closed displaced bimalleolar fracture of left ankle 01/04/2016  . Abnormal EKG 06/22/2014  . Abnormal CT scan of lung 06/22/2014  . Dyspnea 06/19/2014  . Microscopic hematuria 04/07/2014  . Onychomycosis 05/31/2013  . BI-RADS category 3 mammogram result 03/07/2013  . Vitamin D insufficiency 08/13/2011  . Morbid obesity ([place]) 08/10/2007  . ALLERGIC RHINITIS, SEASONAL 07/14/2006  . DM2 (diabetes mellitus, type 2) 05/28/2006  . DEPRESSIVE DISORDER, NOS 05/28/2006  . HTN (hypertension) 05/28/2006    Alda Lea, AGPCNP-BC

## 2019-11-22 ENCOUNTER — Encounter (HOSPITAL_COMMUNITY): Payer: Self-pay | Admitting: Cardiovascular Disease

## 2019-11-22 ENCOUNTER — Encounter (HOSPITAL_COMMUNITY): Payer: Self-pay

## 2019-11-22 ENCOUNTER — Other Ambulatory Visit (HOSPITAL_COMMUNITY): Payer: 59

## 2019-11-22 ENCOUNTER — Other Ambulatory Visit: Payer: 59

## 2019-11-24 ENCOUNTER — Other Ambulatory Visit: Payer: Self-pay

## 2019-11-24 ENCOUNTER — Ambulatory Visit (HOSPITAL_COMMUNITY): Payer: 59 | Attending: Cardiology

## 2019-11-24 DIAGNOSIS — R0600 Dyspnea, unspecified: Secondary | ICD-10-CM | POA: Diagnosis not present

## 2019-11-24 DIAGNOSIS — R072 Precordial pain: Secondary | ICD-10-CM | POA: Diagnosis not present

## 2019-11-24 DIAGNOSIS — R079 Chest pain, unspecified: Secondary | ICD-10-CM | POA: Insufficient documentation

## 2019-11-24 LAB — ECHOCARDIOGRAM COMPLETE
Area-P 1/2: 3.03 cm2
S' Lateral: 3.8 cm

## 2019-11-28 ENCOUNTER — Telehealth (HOSPITAL_COMMUNITY): Payer: Self-pay | Admitting: Emergency Medicine

## 2019-11-28 NOTE — Telephone Encounter (Signed)
Reaching out to patient to offer assistance regarding upcoming cardiac imaging study; pt verbalizes understanding of appt date/time, parking situation and where to check in, pre-test NPO status and medications ordered, and verified current allergies; name and call back number provided for further questions should they arise Deicy Rusk RN Navigator Cardiac Imaging [facility] [phone] office [phone] cell 

## 2019-11-29 ENCOUNTER — Ambulatory Visit (HOSPITAL_COMMUNITY)
Admission: RE | Admit: 2019-11-29 | Discharge: 2019-11-29 | Disposition: A | Discharge status: Home / Self Care | Payer: 59 | Source: Ambulatory Visit | Attending: Cardiovascular Disease | Admitting: Cardiovascular Disease

## 2019-11-29 DIAGNOSIS — R072 Precordial pain: Secondary | ICD-10-CM | POA: Insufficient documentation

## 2019-11-29 LAB — POCT I-STAT CREATININE: Creatinine, Ser: 0.8 mg/dL (ref 0.44–1.00)

## 2019-11-29 MED ORDER — IOHEXOL 350 MG/ML SOLN
90.0000 mL | Freq: Once | INTRAVENOUS | Status: AC | PRN
  Administered 2019-11-29: 90 mL via INTRAVENOUS

## 2019-11-29 MED ORDER — NITROGLYCERIN 0.4 MG SL SUBL
0.8000 mg | SUBLINGUAL_TABLET | Freq: Once | SUBLINGUAL | Status: AC
  Administered 2019-11-29: 0.8 mg via SUBLINGUAL

## 2019-11-29 MED ORDER — NITROGLYCERIN 0.4 MG SL SUBL
SUBLINGUAL_TABLET | SUBLINGUAL | Status: AC
  Filled 2019-11-29: qty 2

## 2019-11-29 NOTE — Progress Notes (Signed)
CT scan completed. Tolerated well. D/C home ambulatory, awake and alert. In no distress. 

## 2019-12-07 MED FILL — LANTUS SOLOSTAR 100 UNITS/M: 100 | 21 days supply | Qty: 3 | Fill #1

## 2019-12-07 MED FILL — AMLODIPINE BESYLATE 5 MG TA: 5 | 31 days supply | Qty: 31 | Fill #1

## 2019-12-07 MED FILL — FUROSEMIDE 20 MG TABS: 20 | 30 days supply | Qty: 30 | Fill #1

## 2019-12-07 MED FILL — ATORVASTATIN CALCIUM 20 MG: 20 | 30 days supply | Qty: 30 | Fill #1

## 2019-12-08 NOTE — Progress Notes (Signed)
Chief Complaint  Patient presents with  . Follow-up    chest pain   History of Present Illness: 49 yo female with history of asthma, depression, diabetes mellitus, GERD and HTN who is here today for cardiac follow up. I saw her as a new patient 10/24/19 for the evaluation of lower extremity swelling. She had pneumonia in June 2021. BNP 66. She described dyspnea at rest and with exertion. Also with exertional chest pain located over her left chest wall. She also felt that she had been retaining fluid and described mild LE edema. Never smoker. Echo 11/24/19 with LVEF=50-55%. Moderate LVH. No significant valve disease. Coronary CTA 11/29/19 with no evidence of CAD. Calcium score zero.   She is here today for follow up. The patient denies any chest pain, dyspnea, palpitations, lower extremity edema, orthopnea, PND, dizziness, near syncope or syncope. She is feeling much better.   Primary Care Physician: Antony Blackbird, MD   Past Medical History:  Diagnosis Date  . Anemia Dx 2013  . Asthma   . Bacterial vaginosis 2008  . Complication of anesthesia    pt states was informed had difficult time awakening following hysterectomy   . Depression   . Depression   . Diabetes mellitus Dx 2005  . Fibroids 09/05/2011   08/08/2011: Anteverted uterus. Diffuse fibroid involvement. Left of midline fibroid with submucosal component. 3.4cm x 2.8cmx 3.4cm.   Marland Kitchen GERD (gastroesophageal reflux disease)   . Gestational diabetes   . H/O shortness of breath    pt states has improved since lossing weight; triggers currently are enviromental pt states can make it to top of stairs w/o having to stop to catch her breath  . Headache(784.0)    migraines  . History of blood transfusion 2013/2014  . History of bronchitis   . Hx of blood clots   . Hypertension Dx 2015  . Kidney infection 2015  . Menorrhagia 09/27/2010  . Pneumonia   . Postpartum depression   . Pregnancy induced hypertension   . Trichomonas     Past  Surgical History:  Procedure Laterality Date  . ABDOMINAL HYSTERECTOMY N/A 11/03/2012   Procedure: HYSTERECTOMY ABDOMINAL;  Surgeon: Ena Dawley, MD;  Location: [facility];  Service: Gynecology;  Laterality: N/A;  . BILATERAL SALPINGECTOMY Bilateral 11/03/2012   Procedure: BILATERAL SALPINGECTOMY;  Surgeon: Ena Dawley, MD;  Location: [facility];  Service: Gynecology;  Laterality: Bilateral;  . BURCH PROCEDURE N/A 11/03/2012   Procedure: BURCH PROCEDURE;  Surgeon: Ena Dawley, MD;  Location: [facility];  Service: Gynecology;  Laterality: N/A;  . CYSTOSCOPY N/A 11/03/2012   Procedure: CYSTOSCOPY;  Surgeon: Ena Dawley, MD;  Location: [facility];  Service: Gynecology;  Laterality: N/A;  . DILATION AND CURETTAGE OF UTERUS    . LAPAROSCOPY N/A 11/03/2012   Procedure: LAPAROSCOPY DIAGNOSTIC;  Surgeon: Ena Dawley, MD;  Location: [facility];  Service: Gynecology;  Laterality: N/A;  . ORIF ANKLE FRACTURE Left 01/04/2016   Procedure: OPEN REDUCTION INTERNAL FIXATION (ORIF) LEFT BIMALLEOLAR ANKLE FRACTURE;  Surgeon: Mcarthur Rossetti, MD;  Location: [facility];  Service: Orthopedics;  Laterality: Left;  . TUBAL LIGATION    . WISDOM TOOTH EXTRACTION     3 teeth    Current Outpatient Medications  Medication Sig Dispense Refill  . albuterol (VENTOLIN HFA) 108 (90 Base) MCG/ACT inhaler Inhale 2 puffs into the lungs every 6 (six) hours as needed for wheezing or shortness of breath. 18 g 3  . albuterol (VENTOLIN HFA) 108 (90 Base) MCG/ACT  inhaler Inhale 1-2 puffs into the lungs every 6 (six) hours as needed for wheezing or shortness of breath. 18 g 0  . amLODipine (NORVASC) 5 MG tablet Take 1 tablet (5 mg total) by mouth daily. 90 tablet 3  . atorvastatin (LIPITOR) 20 MG tablet Take 1 tablet (20 mg total) by mouth daily after supper. 30 tablet 3  . benzonatate (TESSALON) 100 MG capsule Take 1 capsule (100 mg total) by mouth every 8 (eight) hours. 21 capsule 0  . diclofenac Sodium (VOLTAREN) 1 % GEL Apply 4 g  topically 4 (four) times daily as needed. 500 g 6  . FLUoxetine (PROZAC) 10 MG capsule Take 1 capsule (10 mg total) by mouth daily. 60 capsule 3  . furosemide (LASIX) 20 MG tablet Take 1 tablet (20 mg total) by mouth daily. 90 tablet 3  . gabapentin (NEURONTIN) 300 MG capsule 1 PO q HS, may increase to 1 PO TID if needed. 90 capsule 3  . insulin glargine (LANTUS SOLOSTAR) 100 UNIT/ML Solostar Pen Inject 14 Units into the skin daily. 15 mL 11  . Insulin Pen Needle (TRUEPLUS PEN NEEDLES) 32G X 4 MM MISC Use to inject insulin daily. 100 each 11  . lisinopril-hydrochlorothiazide (ZESTORETIC) 20-12.5 MG tablet Take 2 tablets by mouth daily. 180 tablet 1  . metFORMIN (GLUCOPHAGE-XR) 500 MG 24 hr tablet Take 2 tablets (1,000 mg total) by mouth 2 (two) times daily. 360 tablet 1  . naproxen (NAPROSYN) 500 MG tablet Take 1 tablet (500 mg total) by mouth 2 (two) times daily with a meal. As needed for pain/headache; take after eating 60 tablet 2  . naproxen sodium (ANAPROX) 220 MG tablet Take 220 mg by mouth daily as needed (pain).     . nitroGLYCERIN (NITRO-DUR) 0.1 mg/hr patch Apply 1/4 patch to heel area 12 hours daily 30 patch 3  . ONE TOUCH ULTRA TEST test strip USE  STRIP TO CHECK GLUCOSE THREE TIMES DAILY BEFORE MEAL(S) 100 each 5  . PRESCRIPTION MEDICATION Take 0.5 tablets by mouth See admin instructions. 1/2 tablet of Bumetanide.    . valACYclovir (VALTREX) 1000 MG tablet Take 1 tablet (1,000 mg total) by mouth 2 (two) times daily. 20 tablet 0   No current facility-administered medications for this visit.    Allergies  Allergen Reactions  . Other Swelling    Patient says she is unsure of caused the swelling but one of the meds given to her on the 8/6 for her surgery had her extreamly swollen     Social History   Socioeconomic History  . Marital status: Single    Spouse name: Not on file  . Number of children: 3  . Years of education: Not on file  . Highest education level: Not on file    Occupational History  . Occupation: Unemployed  Tobacco Use  . Smoking status: Never Smoker  . Smokeless tobacco: Never Used  Substance and Sexual Activity  . Alcohol use: Yes    Comment: rarely  . Drug use: No  . Sexual activity: Yes    Birth control/protection: Surgical    Comment: hysterectomy  Other Topics Concern  . Not on file  Social History Narrative  . Not on file   Social Determinants of Health   Financial Resource Strain:   . Difficulty of Paying Living Expenses: Not on file  Food Insecurity:   . Worried About Charity fundraiser in the Last Year: Not on file  . Ran Out of Food  in the Last Year: Not on file  Transportation Needs:   . Lack of Transportation (Medical): Not on file  . Lack of Transportation (Non-Medical): Not on file  Physical Activity:   . Days of Exercise per Week: Not on file  . Minutes of Exercise per Session: Not on file  Stress:   . Feeling of Stress : Not on file  Social Connections:   . Frequency of Communication with Friends and Family: Not on file  . Frequency of Social Gatherings with Friends and Family: Not on file  . Attends Religious Services: Not on file  . Active Member of Clubs or Organizations: Not on file  . Attends Archivist Meetings: Not on file  . Marital Status: Not on file  Intimate Partner Violence:   . Fear of Current or Ex-Partner: Not on file  . Emotionally Abused: Not on file  . Physically Abused: Not on file  . Sexually Abused: Not on file    Family History  Problem Relation Age of Onset  . Hypertension Brother   . Stroke Brother   . Sickle cell trait Daughter   . Hypertension Father   . Heart disease Mother   . Rheumatic fever Mother   . Hypertension Mother   . Stroke Mother   . CAD Mother   . Heart disease Paternal Aunt     Review of Systems:  As stated in the HPI and otherwise negative.   BP 140/90   Pulse (!) 54   Ht 5\' 5"  (1.651 m)   Wt 244 lb 6.4 oz (110.9 kg)   LMP 06/29/2012    SpO2 98%   BMI 40.67 kg/m   Physical Examination:  General: Well developed, well nourished, NAD  HEENT: OP clear, mucus membranes moist  SKIN: warm, dry. No rashes. Neuro: No focal deficits  Musculoskeletal: Muscle strength 5/5 all ext  Psychiatric: Mood and affect normal  Neck: No JVD, no carotid bruits, no thyromegaly, no lymphadenopathy.  Lungs:Clear bilaterally, no wheezes, rhonci, crackles Cardiovascular: Regular rate and rhythm. No murmurs, gallops or rubs. Abdomen:Soft. Bowel sounds present. Non-tender.  Extremities: No lower extremity edema. Pulses are 2 + in the bilateral DP/PT.  EKG:  EKG is not  ordered today. The ekg ordered today demonstrates   Coronary CTA 11/29/19: 1. Coronary artery calcium score 0 Agatston units. This suggests low risk for future cardiac events.  2.  No significant coronary disease was seen.  Echo 11/24/19: 1. Left ventricular ejection fraction, by estimation, is 50 to 55%. The  left ventricle has low normal function. The left ventricle has no regional  wall motion abnormalities. There is moderate asymmetric left ventricular  hypertrophy of the posterior  segment. Left ventricular diastolic parameters are consistent with Grade I  diastolic dysfunction (impaired relaxation). Elevated left ventricular  end-diastolic pressure. The average left ventricular global longitudinal  strain is -16.9 %. The global  longitudinal strain is normal.  2. Right ventricular systolic function is normal. The right ventricular  size is normal.  3. The mitral valve is normal in structure. No evidence of mitral valve  regurgitation. No evidence of mitral stenosis.  4. The aortic valve is normal in structure. Aortic valve regurgitation is  trivial. No aortic stenosis is present.  5. The inferior vena cava is normal in size with greater than 50%  respiratory variability, suggesting right atrial pressure of 3 mmHg.   Recent Labs: 09/15/2019: B Natriuretic  Peptide 66.2 10/20/2019: ALT 15; BUN 14; Hemoglobin 16.0; Platelets  214; Potassium 4.0; Sodium 144; TSH 0.861 11/29/2019: Creatinine, Ser 0.80   Lipid Panel    Component Value Date/Time   CHOL 141 08/31/2015 1027   TRIG 117 08/31/2015 1027   HDL 49 08/31/2015 1027   CHOLHDL 2.9 08/31/2015 1027   VLDL 23 08/31/2015 1027   LDLCALC 69 08/31/2015 1027     Wt Readings from Last 3 Encounters:  12/09/19 244 lb 6.4 oz (110.9 kg)  10/24/19 (!) 243 lb 6.4 oz (110.4 kg)  10/20/19 (!) 244 lb 3.2 oz (110.8 kg)    Assessment and Plan:   1. Dyspnea/Chest pain/CHF: Echo with normal LV systolic function. Moderate LVH. Coronary CTA with no evidence of CAD. No further cardiac workup at this time. Repeat echo in 3 years given LVH.   2. Lower extremity edema: Resolved. Lasix prn  Current medicines are reviewed at length with the patient today.  The patient does not have concerns regarding medicines.  The following changes have been made:  no change  Labs/ tests ordered today include:   No orders of the defined types were placed in this encounter.   Disposition:   F/U with me in 1 year   Signed, Lauree Chandler, MD 12/09/2019 8:25 AM    [facility] [place], [place], [place]  [address] Phone: [phone]; Fax: [phone]

## 2019-12-09 ENCOUNTER — Encounter: Payer: Self-pay | Admitting: Cardiovascular Disease

## 2019-12-09 ENCOUNTER — Other Ambulatory Visit: Payer: Self-pay

## 2019-12-09 ENCOUNTER — Ambulatory Visit (INDEPENDENT_AMBULATORY_CARE_PROVIDER_SITE_OTHER): Payer: 59 | Admitting: Cardiovascular Disease

## 2019-12-09 VITALS — BP 140/90 | HR 54 | Ht 65.0 in | Wt 244.4 lb

## 2019-12-09 DIAGNOSIS — R079 Chest pain, unspecified: Secondary | ICD-10-CM

## 2019-12-09 DIAGNOSIS — R0600 Dyspnea, unspecified: Secondary | ICD-10-CM | POA: Diagnosis not present

## 2019-12-09 DIAGNOSIS — R0609 Other forms of dyspnea: Secondary | ICD-10-CM

## 2019-12-09 NOTE — Patient Instructions (Signed)
Medication Instructions:  Your provider recommends that you continue on your current medications as directed. Please refer to the Current Medication list given to you today.   *If you need a refill on your cardiac medications before your next appointment, please call your pharmacy*  Follow-Up: At [facility], you and your health needs are our priority.  As part of our continuing mission to provide you with exceptional heart care, we have created designated Provider Care Teams.  These Care Teams include your primary Cardiologist (physician) and Advanced Practice Providers (APPs -  Physician Assistants and Nurse Practitioners) who all work together to provide you with the care you need, when you need it. Your next appointment:   12 month(s) The format for your next appointment:   In Person Provider:   You may see Lauree Chandler, MD or one of the following Advanced Practice Providers on your designated Care Team:    Richardson Dopp, PA-C  [place], [place]

## 2020-04-04 ENCOUNTER — Ambulatory Visit: Payer: Self-pay

## 2020-04-04 NOTE — Telephone Encounter (Signed)
Patient states she felt constricted earlier and was having a hard time breathing,  Does not have chest pain at this time.  She has been helping someone move all day.  States she has been off her medications for some time now.   Advised patient if pain worsens to go to ED or UC tonight but instructed to go to [facility] tomorrow for the Mobile Unit operations.  Address/ location and hours of operation given.

## 2020-04-04 NOTE — Telephone Encounter (Signed)
4th unsuccessful attempt to contact pt. To continue triage.  Will contact the office and make provider aware of pt's. Call today.   Attempted to call FC at [facility].  No answer.  Will route this note to the office.

## 2020-04-04 NOTE — Telephone Encounter (Signed)
Rec'd call from pt.  Reported elevated BP of 180/119 today at 9:30 AM.  Stated she has not been compliant with taking her medication as prescribed.  Also reported shortness of breath.  Reported it feels like there is increased pressure in upper back; "feels like a cement block is on my upper back area."  Also c/o right arm swelling from a "bite", and she questioned if there was any connection to her shortness of breath.  Proceeded to ask pt. questions re: symptoms, and the call dropped.   Attempted to return call to pt. And got voice message.  Left vm to return call to nurse, Okey Regal, @ [phone].  Answer Assessment - Initial Assessment Questions 1. BLOOD PRESSURE: "What is the blood pressure?" "Did you take at least two measurements 5 minutes apart?"     BP 180/119  9:30 AM; had not had BP medication prior to the BP reading.  2. ONSET: "When did you take your blood pressure?"     See above 3. HOW: "How did you obtain the blood pressure?" (e.g., visiting nurse, automatic home BP monitor)     Home BP cuff; wrist cuff 4. HISTORY: "Do you have a history of high blood pressure?"     Yes 5. MEDICATIONS: "Are you taking any medications for blood pressure?" "Have you missed any doses recently?"     *No Answer* 6. OTHER SYMPTOMS: "Do you have any symptoms?" (e.g., headache, chest pain, blurred vision, difficulty breathing, weakness)     Upper back pressure; shortness of breath with activity of walking and doing laundry.  7. PREGNANCY: "Is there any chance you are pregnant?" "When was your last menstrual period?"     *No Answer*  Protocols used: BLOOD PRESSURE - HIGH-A-AH

## 2020-04-04 NOTE — Telephone Encounter (Signed)
Multiple attempts have been made to reach patient, without success.  Left vm to return call re: her symptoms.

## 2020-04-26 MED FILL — AMLODIPINE BESYLATE 5 MG TA: 5 | 30 days supply | Qty: 30 | Fill #2

## 2020-04-26 MED FILL — !LANTUS SOLOSTAR 100UNITS/M: 100 | 21 days supply | Qty: 3 | Fill #2

## 2020-05-05 ENCOUNTER — Other Ambulatory Visit: Payer: Self-pay

## 2020-05-05 ENCOUNTER — Other Ambulatory Visit (HOSPITAL_COMMUNITY): Payer: Self-pay | Admitting: Student

## 2020-05-05 ENCOUNTER — Emergency Department (HOSPITAL_COMMUNITY): Payer: Self-pay

## 2020-05-05 ENCOUNTER — Emergency Department (HOSPITAL_COMMUNITY)
Admission: EM | Admit: 2020-05-05 | Discharge: 2020-05-05 | Disposition: A | Discharge status: Home / Self Care | Payer: Self-pay | Attending: Emergency Medicine | Admitting: Emergency Medicine

## 2020-05-05 DIAGNOSIS — Z794 Long term (current) use of insulin: Secondary | ICD-10-CM | POA: Insufficient documentation

## 2020-05-05 DIAGNOSIS — I10 Essential (primary) hypertension: Secondary | ICD-10-CM | POA: Insufficient documentation

## 2020-05-05 DIAGNOSIS — E119 Type 2 diabetes mellitus without complications: Secondary | ICD-10-CM | POA: Insufficient documentation

## 2020-05-05 DIAGNOSIS — N3000 Acute cystitis without hematuria: Secondary | ICD-10-CM | POA: Insufficient documentation

## 2020-05-05 DIAGNOSIS — J45909 Unspecified asthma, uncomplicated: Secondary | ICD-10-CM | POA: Insufficient documentation

## 2020-05-05 DIAGNOSIS — Z79899 Other long term (current) drug therapy: Secondary | ICD-10-CM | POA: Insufficient documentation

## 2020-05-05 DIAGNOSIS — R0602 Shortness of breath: Secondary | ICD-10-CM | POA: Insufficient documentation

## 2020-05-05 DIAGNOSIS — R071 Chest pain on breathing: Secondary | ICD-10-CM | POA: Insufficient documentation

## 2020-05-05 DIAGNOSIS — R1012 Left upper quadrant pain: Secondary | ICD-10-CM

## 2020-05-05 DIAGNOSIS — Z7984 Long term (current) use of oral hypoglycemic drugs: Secondary | ICD-10-CM | POA: Insufficient documentation

## 2020-05-05 LAB — URINALYSIS, ROUTINE W REFLEX MICROSCOPIC
Bilirubin Urine: NEGATIVE
Glucose, UA: 150 mg/dL — AB
Ketones, ur: NEGATIVE mg/dL
Nitrite: POSITIVE — AB
Protein, ur: NEGATIVE mg/dL
Specific Gravity, Urine: 1.016 (ref 1.005–1.030)
pH: 5 (ref 5.0–8.0)

## 2020-05-05 LAB — COMPREHENSIVE METABOLIC PANEL
ALT: 24 U/L (ref 0–44)
AST: 20 U/L (ref 15–41)
Albumin: 3.3 g/dL — ABNORMAL LOW (ref 3.5–5.0)
Alkaline Phosphatase: 75 U/L (ref 38–126)
Anion gap: 11 (ref 5–15)
BUN: 11 mg/dL (ref 6–20)
CO2: 25 mmol/L (ref 22–32)
Calcium: 8.8 mg/dL — ABNORMAL LOW (ref 8.9–10.3)
Chloride: 100 mmol/L (ref 98–111)
Creatinine, Ser: 0.97 mg/dL (ref 0.44–1.00)
GFR, Estimated: >60 mL/min (ref >60)
Glucose, Bld: 263 mg/dL — ABNORMAL HIGH (ref 70–99)
Potassium: 4 mmol/L (ref 3.5–5.1)
Sodium: 136 mmol/L (ref 135–145)
Total Bilirubin: 0.7 mg/dL (ref 0.3–1.2)
Total Protein: 6.2 g/dL — ABNORMAL LOW (ref 6.5–8.1)

## 2020-05-05 LAB — BASIC METABOLIC PANEL
Anion gap: 12 (ref 5–15)
BUN: 12 mg/dL (ref 6–20)
CO2: 24 mmol/L (ref 22–32)
Calcium: 9 mg/dL (ref 8.9–10.3)
Chloride: 100 mmol/L (ref 98–111)
Creatinine, Ser: 0.89 mg/dL (ref 0.44–1.00)
GFR, Estimated: >60 mL/min (ref >60)
Glucose, Bld: 271 mg/dL — ABNORMAL HIGH (ref 70–99)
Potassium: 3.8 mmol/L (ref 3.5–5.1)
Sodium: 136 mmol/L (ref 135–145)

## 2020-05-05 LAB — CBC
HCT: 40.9 % (ref 36.0–46.0)
Hemoglobin: 13.3 g/dL (ref 12.0–15.0)
MCH: 28.7 pg (ref 26.0–34.0)
MCHC: 32.5 g/dL (ref 30.0–36.0)
MCV: 88.1 fL (ref 80.0–100.0)
Platelets: 233 10*3/uL (ref 150–400)
RBC: 4.64 MIL/uL (ref 3.87–5.11)
RDW: 12.3 % (ref 11.5–15.5)
WBC: 6.8 10*3/uL (ref 4.0–10.5)
nRBC: 0 % (ref 0.0–0.2)

## 2020-05-05 LAB — LIPASE, BLOOD: Lipase: 28 U/L (ref 11–51)

## 2020-05-05 LAB — TROPONIN I (HIGH SENSITIVITY): Troponin I (High Sensitivity): 6 ng/L (ref <18)

## 2020-05-05 MED ORDER — ONDANSETRON HCL 4 MG PO TABS: 4.0000 mg | ORAL_TABLET | Freq: Three times a day (TID) | ORAL | 0 refills | Status: DC | PRN

## 2020-05-05 MED ORDER — OMEPRAZOLE 20 MG PO CPDR: 20.0000 mg | DELAYED_RELEASE_CAPSULE | Freq: Every day | ORAL | 0 refills | Status: DC

## 2020-05-05 MED ORDER — ONDANSETRON HCL 4 MG/2ML IJ SOLN
4.0000 mg | Freq: Once | INTRAMUSCULAR | Status: AC
  Administered 2020-05-05: 4 mg via INTRAVENOUS
  Filled 2020-05-05: qty 2

## 2020-05-05 MED ORDER — DICYCLOMINE HCL 10 MG/ML IM SOLN
20.0000 mg | Freq: Once | INTRAMUSCULAR | Status: AC
  Administered 2020-05-05: 20 mg via INTRAMUSCULAR
  Filled 2020-05-05: qty 2

## 2020-05-05 MED ORDER — IOHEXOL 300 MG/ML  SOLN
100.0000 mL | Freq: Once | INTRAMUSCULAR | Status: AC | PRN
  Administered 2020-05-05: 100 mL via INTRAVENOUS

## 2020-05-05 MED ORDER — ALBUTEROL SULFATE HFA 108 (90 BASE) MCG/ACT IN AERS: 2.0000 | INHALATION_SPRAY | RESPIRATORY_TRACT | Status: DC | PRN

## 2020-05-05 MED ORDER — CEPHALEXIN 500 MG PO CAPS: 500.0000 mg | ORAL_CAPSULE | Freq: Two times a day (BID) | ORAL | 0 refills | Status: DC

## 2020-05-05 NOTE — ED Triage Notes (Signed)
Pt presents to ED POV. Pt c/o SOB and epigastric pressure. Pt reports that this has been intermittent since December. Pt also reports decrease in appetite and 5lb weight loss. Pt states that she does not take her home medication compliantly all the time. Pt states that she also has urinary frequency.

## 2020-05-05 NOTE — Discharge Instructions (Signed)
You been seen here for abdominal pain and shortness of breath.  Lab work shows that you have a UTI.  I start you on antibiotics please take as prescribed.  Also start you on an acid pill please take as prescribed.  I have given you Zofran for nausea please use as needed.  I recommend staying hydrated.  You may take over-the-counter pain medications like ibuprofen and/or Tylenol every 6 hours as needed please follow dosage on the back of bottle.  I recommend abstaining from high fatty foods as this can sometimes increase abdominal pain.  Please follow-up with your primary care provider in 7 days time for reevaluation of your urine.  Come back to the emergency department if you develop chest pain, shortness of breath, severe abdominal pain, uncontrolled nausea, vomiting, diarrhea.

## 2020-05-05 NOTE — ED Provider Notes (Signed)
[facility] EMERGENCY DEPARTMENT Provider Note   CSN: [id number] Arrival date & time: 05/05/20  0246     History Chief Complaint  Patient presents with  . Shortness of Breath    Julia Dunn is a 50 y.o. female.  HPI   Patient with significant medical history of depression, diabetes, GERD, hypertension, presents with complaint of left upper abdominal pain.  Patient endorses this pain started on Sunday and has stayed consistent.  She describes the pain as a tight sensation which she feels around her lower chest and upper abdomen describes it a belt around her and cinching it tight.  She endorses a lack of appetite, nausea without vomiting, pleuritic chest pain, and left upper quadrant pain with frequent urination.  Patient endorses that she has experienced in the past, this is the third episode that she has had, generally these episodes last about 5 days and go away on their own.  She has no significant abdominal history, she denies pancreatitis, kidney stones, no history of stomach ulcers, denies alcohol use, NSAID use.  Patient does endorse that the pain seems to be worsened with high fatty foods, she is tolerating p.o. at this time.  Patient denies any alleviating factors.  Patient denies headaches, fevers, chills, shortness of breath, chest pain, worsening pedal edema.  Past Medical History:  Diagnosis Date  . Anemia Dx 2013  . Asthma   . Bacterial vaginosis 2008  . Complication of anesthesia    pt states was informed had difficult time awakening following hysterectomy   . Depression   . Depression   . Diabetes mellitus Dx 2005  . Fibroids 09/05/2011   08/08/2011: Anteverted uterus. Diffuse fibroid involvement. Left of midline fibroid with submucosal component. 3.4cm x 2.8cmx 3.4cm.   Marland Kitchen GERD (gastroesophageal reflux disease)   . Gestational diabetes   . H/O shortness of breath    pt states has improved since lossing weight; triggers currently are enviromental pt  states can make it to top of stairs w/o having to stop to catch her breath  . Headache(784.0)    migraines  . History of blood transfusion 2013/2014  . History of bronchitis   . Hx of blood clots   . Hypertension Dx 2015  . Kidney infection 2015  . Menorrhagia 09/27/2010  . Pneumonia   . Postpartum depression   . Pregnancy induced hypertension   . Trichomonas     Patient Active Problem List   Diagnosis Date Noted  . Closed displaced bimalleolar fracture of left ankle 01/04/2016  . Abnormal EKG 06/22/2014  . Abnormal CT scan of lung 06/22/2014  . Dyspnea 06/19/2014  . Microscopic hematuria 04/07/2014  . Onychomycosis 05/31/2013  . BI-RADS category 3 mammogram result 03/07/2013  . Vitamin D insufficiency 08/13/2011  . Morbid obesity ([place]) 08/10/2007  . ALLERGIC RHINITIS, SEASONAL 07/14/2006  . DM2 (diabetes mellitus, type 2) 05/28/2006  . DEPRESSIVE DISORDER, NOS 05/28/2006  . HTN (hypertension) 05/28/2006    Past Surgical History:  Procedure Laterality Date  . ABDOMINAL HYSTERECTOMY N/A 11/03/2012   Procedure: HYSTERECTOMY ABDOMINAL;  Surgeon: Ena Dawley, MD;  Location: [facility];  Service: Gynecology;  Laterality: N/A;  . BILATERAL SALPINGECTOMY Bilateral 11/03/2012   Procedure: BILATERAL SALPINGECTOMY;  Surgeon: Ena Dawley, MD;  Location: [facility];  Service: Gynecology;  Laterality: Bilateral;  . BURCH PROCEDURE N/A 11/03/2012   Procedure: BURCH PROCEDURE;  Surgeon: Ena Dawley, MD;  Location: [facility];  Service: Gynecology;  Laterality: N/A;  . CYSTOSCOPY N/A 11/03/2012  Procedure: CYSTOSCOPY;  Surgeon: Ena Dawley, MD;  Location: [facility];  Service: Gynecology;  Laterality: N/A;  . DILATION AND CURETTAGE OF UTERUS    . LAPAROSCOPY N/A 11/03/2012   Procedure: LAPAROSCOPY DIAGNOSTIC;  Surgeon: Ena Dawley, MD;  Location: [facility];  Service: Gynecology;  Laterality: N/A;  . ORIF ANKLE FRACTURE Left 01/04/2016   Procedure: OPEN REDUCTION INTERNAL FIXATION (ORIF) LEFT  BIMALLEOLAR ANKLE FRACTURE;  Surgeon: Mcarthur Rossetti, MD;  Location: [facility];  Service: Orthopedics;  Laterality: Left;  . TUBAL LIGATION    . WISDOM TOOTH EXTRACTION     3 teeth     OB History    Gravida  6   Para  3   Term  3   Preterm      AB  3   Living  3     SAB      IAB  3   Ectopic      Multiple      Live Births  3           Family History  Problem Relation Age of Onset  . Hypertension Brother   . Stroke Brother   . Sickle cell trait Daughter   . Hypertension Father   . Heart disease Mother   . Rheumatic fever Mother   . Hypertension Mother   . Stroke Mother   . CAD Mother   . Heart disease Paternal Aunt     Social History   Tobacco Use  . Smoking status: Never Smoker  . Smokeless tobacco: Never Used  Substance Use Topics  . Alcohol use: Yes    Comment: rarely  . Drug use: No    Home Medications Prior to Admission medications   Medication Sig Start Date End Date Taking? Authorizing Provider  cephALEXin (KEFLEX) 500 MG capsule Take 1 capsule (500 mg total) by mouth 2 (two) times daily for 7 days. 05/05/20 05/12/20 Yes Marcello Fennel, PA-C  omeprazole (PRILOSEC) 20 MG capsule Take 1 capsule (20 mg total) by mouth daily. 05/05/20 06/04/20 Yes Marcello Fennel, PA-C  ondansetron (ZOFRAN) 4 MG tablet Take 1 tablet (4 mg total) by mouth every 8 (eight) hours as needed for nausea or vomiting. 05/05/20  Yes Marcello Fennel, PA-C  albuterol (VENTOLIN HFA) 108 (90 Base) MCG/ACT inhaler Inhale 2 puffs into the lungs every 6 (six) hours as needed for wheezing or shortness of breath. 10/20/19   Argentina Donovan, PA-C  albuterol (VENTOLIN HFA) 108 (90 Base) MCG/ACT inhaler Inhale 1-2 puffs into the lungs every 6 (six) hours as needed for wheezing or shortness of breath. 10/20/19   Argentina Donovan, PA-C  amLODipine (NORVASC) 5 MG tablet Take 1 tablet (5 mg total) by mouth daily. 10/20/19   Argentina Donovan, PA-C  atorvastatin (LIPITOR) 20 MG  tablet Take 1 tablet (20 mg total) by mouth daily after supper. 10/20/19   Argentina Donovan, PA-C  benzonatate (TESSALON) 100 MG capsule Take 1 capsule (100 mg total) by mouth every 8 (eight) hours. 09/15/19   Suzy Bouchard, PA-C  diclofenac Sodium (VOLTAREN) 1 % GEL Apply 4 g topically 4 (four) times daily as needed. 03/14/19   Hilts, Legrand Como, MD  FLUoxetine (PROZAC) 10 MG capsule Take 1 capsule (10 mg total) by mouth daily. 04/21/19   Fulp, Cammie, MD  furosemide (LASIX) 20 MG tablet Take 1 tablet (20 mg total) by mouth daily. 10/24/19   Burnell Blanks, MD  gabapentin (NEURONTIN) 300 MG capsule  1 PO q HS, may increase to 1 PO TID if needed. 04/21/19   Fulp, Cammie, MD  insulin glargine (LANTUS SOLOSTAR) 100 UNIT/ML Solostar Pen Inject 14 Units into the skin daily. 10/20/19   Argentina Donovan, PA-C  Insulin Pen Needle (TRUEPLUS PEN NEEDLES) 32G X 4 MM MISC Use to inject insulin daily. 04/21/19   Fulp, Cammie, MD  lisinopril-hydrochlorothiazide (ZESTORETIC) 20-12.5 MG tablet Take 2 tablets by mouth daily. 10/20/19   Argentina Donovan, PA-C  metFORMIN (GLUCOPHAGE-XR) 500 MG 24 hr tablet Take 2 tablets (1,000 mg total) by mouth 2 (two) times daily. 10/20/19   Argentina Donovan, PA-C  naproxen (NAPROSYN) 500 MG tablet Take 1 tablet (500 mg total) by mouth 2 (two) times daily with a meal. As needed for pain/headache; take after eating 04/21/19   Fulp, Cammie, MD  naproxen sodium (ANAPROX) 220 MG tablet Take 220 mg by mouth daily as needed (pain).     [provider]  nitroGLYCERIN (NITRO-DUR) 0.1 mg/hr patch Apply 1/4 patch to heel area 12 hours daily 03/14/19   Hilts, Michael, MD  ONE TOUCH ULTRA TEST test strip USE  STRIP TO CHECK GLUCOSE THREE TIMES DAILY BEFORE MEAL(S) 10/23/16   Charlott Rakes, MD  PRESCRIPTION MEDICATION Take 0.5 tablets by mouth See admin instructions. 1/2 tablet of Bumetanide.    [provider]  valACYclovir (VALTREX) 1000 MG tablet Take 1 tablet (1,000  mg total) by mouth 2 (two) times daily. 10/25/19   Gildardo Pounds, NP    Allergies    Other  Review of Systems   Review of Systems  Constitutional: Negative for chills and fever.  HENT: Negative for congestion and tinnitus.   Respiratory: Positive for shortness of breath.   Cardiovascular: Negative for chest pain.  Gastrointestinal: Positive for nausea. Negative for abdominal pain, diarrhea and vomiting.  Genitourinary: Positive for frequency. Negative for dysuria, enuresis, flank pain, hematuria and vaginal discharge.  Musculoskeletal: Negative for back pain.  Skin: Negative for rash.  Neurological: Negative for dizziness and headaches.  Hematological: Does not bruise/bleed easily.    Physical Exam Updated Vital Signs BP 118/79   Pulse (!) 47   Temp 98.2 F (36.8 C) (Oral)   Resp 12   LMP 06/29/2012   SpO2 93%   Physical Exam Vitals and nursing note reviewed.  Constitutional:      General: She is not in acute distress.    Appearance: She is not ill-appearing.  HENT:     Head: Normocephalic and atraumatic.     Nose: No congestion.  Eyes:     Conjunctiva/sclera: Conjunctivae normal.  Cardiovascular:     Rate and Rhythm: Normal rate and regular rhythm.     Pulses: Normal pulses.     Heart sounds: No murmur heard. No friction rub. No gallop.   Pulmonary:     Effort: No respiratory distress.     Breath sounds: No wheezing, rhonchi or rales.  Abdominal:     General: There is no distension.     Palpations: Abdomen is soft.     Tenderness: There is abdominal tenderness. There is right CVA tenderness and left CVA tenderness.     Comments: Patient abdomen is visualized, it was nondistended, normoactive bowel sounds, dull to percussion.  She had tenderness to palpation in her epigastric and left upper quadrant, no rebound tenderness, no peritoneal sign, negative Murphy sign or McBurney point.  She had positive CVA tenderness bilaterally.  Musculoskeletal:     Right lower  leg: No edema.     Left lower leg: No edema.  Skin:    General: Skin is warm and dry.  Neurological:     Mental Status: She is alert.  Psychiatric:        Mood and Affect: Mood normal.     ED Results / Procedures / Treatments   Labs (all labs ordered are listed, but only abnormal results are displayed) Labs Reviewed  BASIC METABOLIC PANEL - Abnormal; Notable for the following components:      Result Value   Glucose, Bld 271 (*)    All other components within normal limits  URINALYSIS, ROUTINE W REFLEX MICROSCOPIC - Abnormal; Notable for the following components:   APPearance HAZY (*)    Glucose, UA 150 (*)    Hgb urine dipstick SMALL (*)    Nitrite POSITIVE (*)    Leukocytes,Ua TRACE (*)    Bacteria, UA RARE (*)    All other components within normal limits  COMPREHENSIVE METABOLIC PANEL - Abnormal; Notable for the following components:   Glucose, Bld 263 (*)    Calcium 8.8 (*)    Total Protein 6.2 (*)    Albumin 3.3 (*)    All other components within normal limits  URINE CULTURE  CBC  LIPASE, BLOOD  TROPONIN I (HIGH SENSITIVITY)  TROPONIN I (HIGH SENSITIVITY)    EKG EKG Interpretation  Date/Time:  Saturday May 05 2020 19:36:44 EST Ventricular Rate:  53 PR Interval:    QRS Duration: 103 QT Interval:  487 QTC Calculation: 458 R Axis:   97 Text Interpretation: Sinus rhythm Borderline right axis deviation Anteroseptal infarct, old When compared to prior, similar apperance with bradycardia. No STEMI Confirmed by Antony Blackbird [phone]) on 05/05/2020 8:41:30 PM   Radiology DG Chest 2 View  Result Date: 05/05/2020 CLINICAL DATA:  Shortness of breath EXAM: CHEST - 2 VIEW COMPARISON:  09/15/2019 FINDINGS: Heart and mediastinal contours are within normal limits. No focal opacities or effusions. No acute bony abnormality. IMPRESSION: No active cardiopulmonary disease. Electronically Signed   By: Rolm Baptise M.D.   On: 05/05/2020 03:43   CT ABDOMEN PELVIS W  CONTRAST  Result Date: 05/05/2020 CLINICAL DATA:  Shortness of breath and epigastric pressure. Intermittent since December. Diverticulitis suspected. EXAM: CT ABDOMEN AND PELVIS WITH CONTRAST TECHNIQUE: Multidetector CT imaging of the abdomen and pelvis was performed using the standard protocol following bolus administration of intravenous contrast. CONTRAST:  187m OMNIPAQUE IOHEXOL 300 MG/ML  SOLN COMPARISON:  CT abdomen pelvis 02/07/2007 FINDINGS: Lower chest: No acute abnormality. Hepatobiliary: No focal liver abnormality. No gallstones, gallbladder wall thickening, or pericholecystic fluid. No biliary dilatation. Pancreas: No focal lesion. Normal pancreatic contour. No surrounding inflammatory changes. No main pancreatic ductal dilatation. Spleen: Normal in size without focal abnormality. Adrenals/Urinary Tract: No adrenal nodule bilaterally. Bilateral kidneys enhance symmetrically. No hydronephrosis. No hydroureter. The urinary bladder is unremarkable. Stomach/Bowel: Stomach is within normal limits. No evidence of bowel wall thickening or dilatation. Appendix appears normal. Vascular/Lymphatic: No abdominal aorta or iliac aneurysm. Mild atherosclerotic plaque of the aorta and its branches. No abdominal, pelvic, or inguinal lymphadenopathy. Reproductive: Status post hysterectomy. There is a 4.2 x 2.6 x 2.5 cm fluid density lesion within the right adnexal region. The left adnexal regions and is unremarkable. Other: No intraperitoneal free fluid. No intraperitoneal free gas. No organized fluid collection. Musculoskeletal: No abdominal wall hernia or abnormality. No suspicious lytic or blastic osseous lesions. No acute displaced fracture. IMPRESSION: A 4.2 x 2.6 x 2.5  cm fluid density lesion within the right adnexal region in a patient status post hysterectomy and salpingectomy. Finding could represent the right ovary. If patient is postmenopausal, recommend follow-up US in 6-12 months. Note: This recommendation  does not apply to premenarchal patients and to those with increased risk (genetic, family history, elevated tumor markers or other high-risk factors) of ovarian cancer. Reference: JACR 2020 Feb; 17(2):248-254. Electronically Signed   By: Iven Finn M.D.   On: 05/05/2020 20:18    Procedures Procedures   Medications Ordered in ED Medications  albuterol (VENTOLIN HFA) 108 (90 Base) MCG/ACT inhaler 2 puff (has no administration in time range)  ondansetron (ZOFRAN) injection 4 mg (4 mg Intravenous Given 05/05/20 1931)  dicyclomine (BENTYL) injection 20 mg (20 mg Intramuscular Given 05/05/20 1931)  iohexol (OMNIPAQUE) 300 MG/ML solution 100 mL (100 mLs Intravenous Contrast Given 05/05/20 1953)    ED Course  I have reviewed the triage vital signs and the nursing notes.  Pertinent labs & imaging results that were available during my care of the patient were reviewed by me and considered in my medical decision making (see chart for details).    MDM Rules/Calculators/A&P                          Initial impression-patient presents with abdominal pain.  She is alert, does not appear to be in acute distress, vital signs reassuring.  Concern for atypical ACS, gallbladder abnormality, pancreatitis, diverticulitis.  Will obtain lab work, EKG, CT abdomen pelvis.  Will provide patient with antiemetics and pain medication reevaluate.  Work-up-CBC is unremarkable.  CMP shows hyperglycemia of 263, no AKI, no elevation liver enzymes, no anion gap present.  UA shows positive nitrates, trace leukocytes, 11-20 white blood cells, rare bacteria, squamous cells present.  Lipase 28, troponin 6. chest x-ray unremarkable.  CT abdomen pelvis shows fluid density lesion within the right adnexal region could represent right ovary recommends ultrasound in 6 to 12 months for reevaluation. EKG sinus rhythm not signs of ischemia no ST elevation depression noted.   Reassessment patient was reevaluated after providing her with  Bentyl, Zofran, states it has improved her pain.  Vital signs have remained stable, patient has no other complaints at this time.  Rule out-I have low suspicion for ACS as history is atypical, patient has no cardiac history, EKG sinus without signs of ischemia, initial troponin is 6, will defer second opponent as patient has been chest pain-free for over 12 hours with a initial troponin of 6, ACS unlikely as I would expect troponin to be elevated at this time.  Low suspicion for AAA as patient has low risk factors, history is atypical, no bulging mass on my exam.  Low suspicion for gallbladder abnormality as patient had no right upper quadrant pain, and elevation in liver enzymes or alk phos.  Low suspicion for pancreatitis as lipase is 26. low suspicion for perforated stomach ulcer as abdomen is nondistended, no peritoneal signs on my exam.  Low suspicion for pyelonephritis as there is no evidence of this seen on CT abdomen pelvis.  Patient's UA is abnormal shows nitrates, leukocytes, rare bacteria and squamous cells.  It is contaminated but patient is endorsing urinary frequency.  Cannot exclude possibility of UTI, will start patient on antibiotics.  Plan-suspect patient suffering from UTI will start her on Keflex and have her follow-up with PCP for reevaluation. will also provide patient with PPIs as I suspect she may be suffering  from GERD as well.  Vital signs have remained stable, no indication for hospital admission.  Patient discussed with attending and they agreed with assessment and plan.  Patient given at home care as well strict return precautions.  Patient verbalized that they understood agreed to said plan.   Final Clinical Impression(s) / ED Diagnoses Final diagnoses:  SOB (shortness of breath)  Acute cystitis without hematuria  Left upper quadrant abdominal pain    Rx / DC Orders ED Discharge Orders         Ordered    cephALEXin (KEFLEX) 500 MG capsule  2 times daily         05/05/20 2140    omeprazole (PRILOSEC) 20 MG capsule  Daily        05/05/20 2140    ondansetron (ZOFRAN) 4 MG tablet  Every 8 hours PRN        05/05/20 2140           Marcello Fennel, PA-C 05/05/20 2246    Tegeler, Gwenyth Allegra, MD 05/06/20 0010

## 2020-05-05 NOTE — ED Notes (Signed)
Called pt x2 for vitals, no response. °

## 2020-05-07 MED FILL — ONDANSETRON HCL 4 MG TABLET: 4 | 4 days supply | Qty: 12 | Fill #0

## 2020-05-07 MED FILL — OMEPRAZOLE 20 MG CAP: 20 | 30 days supply | Qty: 30 | Fill #0

## 2020-05-07 MED FILL — CEPHALEXIN 500 MG CAPSULE: 500 | 7 days supply | Qty: 14 | Fill #0

## 2020-05-08 LAB — URINE CULTURE: Culture: >=100000 — AB

## 2020-05-30 ENCOUNTER — Ambulatory Visit: Payer: 59 | Admitting: Nurse Practitioner

## 2020-08-21 ENCOUNTER — Other Ambulatory Visit: Payer: Self-pay

## 2020-08-22 ENCOUNTER — Ambulatory Visit: Payer: Self-pay | Admitting: *Deleted

## 2020-08-22 NOTE — Telephone Encounter (Signed)
Pt called in c/o numbness and tingling in both hands in her fingers and thumbs.   The middle fingers and thumbs are worse.   Her Palms are white.    She has been diagnosed with carpel tunnel and knows she needs surgery in both hands. 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  There are no appts available at [facility] until the end of June and into July 2022.   I suggested she contact her orthopedic dr and see they he can get her in earlier.   I offered to g a head and make her an appt with a provider however she wants to wait and see if she can get into the orthopedic dr sooner.   If not she was agreeable to calling us back for an appt.    Reason for Disposition . [1] Weakness of arm / hand, or leg / foot AND [2] is a chronic symptom (recurrent or ongoing AND present > 4 weeks)    Dx with carpel tunnel in both hands.   Needs surgery  Answer Assessment - Initial Assessment Questions 1. SYMPTOM: "What is the main symptom you are concerned about?" (e.g., weakness, numbness)     Both hands all my fingers are numb and tingling.   Middle fingers and thumbs are the worst.    I have carpel tunnel.  2. ONSET: "When did this start?" (minutes, hours, days; while sleeping)     I've been diagnosed with the carpel tunnel.    The pain and numbness is getting worse. I take Aleve every day for it.    I can feel hot and cold.   I can see redness around my fingers and my palm is white.    I clean houses so it's making my hands worst.   I can't make a tight fist.   My fingers are sensitive.   I can feel smooth and rough.   3. LAST NORMAL: "When was the last time you (the patient) were normal (no symptoms)?"     Greater than 6 months it's been going on. 4. PATTERN "Does this come and go, or has it been constant since it started?"  "Is it present now?"     Constant now   I need help.      I'm trying not to  get a big medical bill.     The dr told me I need surgery for the carpel tunnel in both hands. 5. CARDIAC SYMPTOMS: "Have you had any of the following symptoms: chest pain, difficulty breathing, palpitations?"     N/A 6. NEUROLOGIC SYMPTOMS: "Have you had any of the following symptoms: headache, dizziness, vision loss, double vision, changes in speech, unsteady on your feet?"     The numbness and tingling goes up to the midway part of my upper arm on both arms. 7. OTHER SYMPTOMS: "Do you have any other symptoms?"     No 8. PREGNANCY: "Is there any chance you are pregnant?" "When was your last menstrual period?"     Not asked  Protocols used: NEUROLOGIC DEFICIT-A-AH

## 2020-10-04 ENCOUNTER — Other Ambulatory Visit: Payer: Self-pay

## 2020-10-04 ENCOUNTER — Other Ambulatory Visit: Payer: Self-pay | Admitting: Physician Assistant

## 2020-10-04 DIAGNOSIS — I10 Essential (primary) hypertension: Secondary | ICD-10-CM

## 2020-10-04 MED FILL — Insulin Glargine Soln Pen-Injector 100 Unit/ML: SUBCUTANEOUS | 21 days supply | Qty: 3 | Fill #0 | Status: AC

## 2020-10-04 MED FILL — Amlodipine Besylate Tab 5 MG (Base Equivalent): ORAL | 30 days supply | Qty: 30 | Fill #0 | Status: AC

## 2020-10-05 ENCOUNTER — Other Ambulatory Visit: Payer: Self-pay

## 2020-10-05 NOTE — Telephone Encounter (Signed)
Requested medications are due for refill today yes  Requested medications are on the active medication list yes  Last visit 09/2019, to return in 6 months but did not  Future visit scheduled no upcoming appt  Notes to clinic Failed protocol due to no valid visit within 6  months, no upcoming apppt.

## 2020-10-08 ENCOUNTER — Emergency Department (HOSPITAL_BASED_OUTPATIENT_CLINIC_OR_DEPARTMENT_OTHER)
Admission: EM | Admit: 2020-10-08 | Discharge: 2020-10-09 | Disposition: A | Discharge status: Home / Self Care | Payer: Self-pay | Attending: Emergency Medicine | Admitting: Emergency Medicine

## 2020-10-08 ENCOUNTER — Other Ambulatory Visit: Payer: Self-pay | Admitting: Cardiovascular Disease

## 2020-10-08 ENCOUNTER — Other Ambulatory Visit: Payer: Self-pay

## 2020-10-08 ENCOUNTER — Emergency Department (HOSPITAL_BASED_OUTPATIENT_CLINIC_OR_DEPARTMENT_OTHER): Payer: Self-pay | Admitting: Radiology

## 2020-10-08 ENCOUNTER — Telehealth: Payer: Self-pay | Admitting: Cardiovascular Disease

## 2020-10-08 ENCOUNTER — Telehealth: Payer: Self-pay | Admitting: *Deleted

## 2020-10-08 ENCOUNTER — Encounter (HOSPITAL_BASED_OUTPATIENT_CLINIC_OR_DEPARTMENT_OTHER): Payer: Self-pay | Admitting: Emergency Medicine

## 2020-10-08 DIAGNOSIS — I509 Heart failure, unspecified: Secondary | ICD-10-CM

## 2020-10-08 DIAGNOSIS — R0602 Shortness of breath: Secondary | ICD-10-CM | POA: Insufficient documentation

## 2020-10-08 DIAGNOSIS — J45909 Unspecified asthma, uncomplicated: Secondary | ICD-10-CM | POA: Insufficient documentation

## 2020-10-08 DIAGNOSIS — E119 Type 2 diabetes mellitus without complications: Secondary | ICD-10-CM | POA: Insufficient documentation

## 2020-10-08 DIAGNOSIS — Z7984 Long term (current) use of oral hypoglycemic drugs: Secondary | ICD-10-CM | POA: Insufficient documentation

## 2020-10-08 DIAGNOSIS — Z794 Long term (current) use of insulin: Secondary | ICD-10-CM | POA: Insufficient documentation

## 2020-10-08 DIAGNOSIS — R600 Localized edema: Secondary | ICD-10-CM | POA: Insufficient documentation

## 2020-10-08 DIAGNOSIS — Z79899 Other long term (current) drug therapy: Secondary | ICD-10-CM | POA: Insufficient documentation

## 2020-10-08 DIAGNOSIS — I10 Essential (primary) hypertension: Secondary | ICD-10-CM | POA: Insufficient documentation

## 2020-10-08 DIAGNOSIS — R0600 Dyspnea, unspecified: Secondary | ICD-10-CM

## 2020-10-08 LAB — BASIC METABOLIC PANEL
Anion gap: 7 (ref 5–15)
BUN: 14 mg/dL (ref 6–20)
CO2: 26 mmol/L (ref 22–32)
Calcium: 8.4 mg/dL — ABNORMAL LOW (ref 8.9–10.3)
Chloride: 107 mmol/L (ref 98–111)
Creatinine, Ser: 0.84 mg/dL (ref 0.44–1.00)
GFR, Estimated: >60 mL/min (ref >60)
Glucose, Bld: 205 mg/dL — ABNORMAL HIGH (ref 70–99)
Potassium: 4 mmol/L (ref 3.5–5.1)
Sodium: 140 mmol/L (ref 135–145)

## 2020-10-08 LAB — CBC
HCT: 42.1 % (ref 36.0–46.0)
Hemoglobin: 13.7 g/dL (ref 12.0–15.0)
MCH: 28.6 pg (ref 26.0–34.0)
MCHC: 32.5 g/dL (ref 30.0–36.0)
MCV: 87.9 fL (ref 80.0–100.0)
Platelets: 186 10*3/uL (ref 150–400)
RBC: 4.79 MIL/uL (ref 3.87–5.11)
RDW: 13.1 % (ref 11.5–15.5)
WBC: 8.1 10*3/uL (ref 4.0–10.5)
nRBC: 0 % (ref 0.0–0.2)

## 2020-10-08 LAB — TROPONIN I (HIGH SENSITIVITY): Troponin I (High Sensitivity): 28 ng/L — ABNORMAL HIGH (ref <18)

## 2020-10-08 LAB — BRAIN NATRIURETIC PEPTIDE: B Natriuretic Peptide: 161.6 pg/mL — ABNORMAL HIGH (ref 0.0–100.0)

## 2020-10-08 MED ORDER — FUROSEMIDE 10 MG/ML IJ SOLN
40.0000 mg | Freq: Once | INTRAMUSCULAR | Status: AC
  Administered 2020-10-09: 40 mg via INTRAVENOUS
  Filled 2020-10-08: qty 4

## 2020-10-08 MED ORDER — FUROSEMIDE 20 MG PO TABS
20.0000 mg | ORAL_TABLET | Freq: Every day | ORAL | 0 refills | Status: DC
  Filled 2020-10-08 – 2020-11-13 (×2): qty 30, 30d supply, fill #0

## 2020-10-08 NOTE — Telephone Encounter (Signed)
Patient came to office to pick up medications that she had refills on. She is unable to p/u Lisinopril since last OV was about 1 year.   Chest pain  and SOB Unable to do daily task today without feeling SOB or having chest discomfort-  Noticed BLE swelling, non pitting.   She states sx's has been ongoing x 1 month but progressing since last night. "Feels like someone is sitting on my chest" Patient admits to stressors such as taking care of family members and her job.   Patient advised to go to ED. She denies wanting ambulance service.  She is on the phone speaking to her Cardiology office who was trying to get patient medication refilled and assess her pain as well.     VS- BP- 192/138 P- 93 RR- 24 SpO2- 97% Rechecked BP- 162/103 Patient is tearful, not diaphoretic, color and appearance WNL for ethnicity.  Has DM, obese, chest pain, HTN, traveled on airplane this week.   Advised to go to ED for further evaluation and can f/u with [facility] for medication refills and health maintenance after acute concerns. Patient again denies EMS transportation to be called. Admits to understanding to seek further evaluation today.

## 2020-10-08 NOTE — Telephone Encounter (Signed)
At [facility] currently.  Came to get meds there today.  Being seen by the nurse while talking to me.  She reports swelling-pedal/ankle legs bilat up to knees,  SOB, unable to keep up with activities.  Busy taking care of kids and her mom normally, but unable to walk very far due to SOB.  Has to take very freq breaks if walking.   Feels like someone is sitting on her chest.  Going on at least a week but much worse over the last 2 days. Asking for new lasix prescription to be sent.  It has already been sent this afternoon.  States is not having much urine out recently and drinking a lot of water.    I recommended she be seen at the ER with her worsening symptoms.  We hung up so that [place] could continue her assessment.

## 2020-10-08 NOTE — Telephone Encounter (Signed)
Patient c/o Palpitations:  High priority if patient c/o lightheadedness, shortness of breath, or chest pain  How long have you had palpitations/irregular HR/ Afib? Are you having the symptoms now? Yes since Friday while she was out of town  Are you currently experiencing lightheadedness, SOB or CP?  sob  Do you have a history of afib (atrial fibrillation) or irregular heart rhythm? No  Have you checked your BP or HR? (document readings if available):  No  Are you experiencing any other symptoms? Diff breathing    Pt not urinating or having bowel movement, pt took 4 exlax and she finally went today.

## 2020-10-08 NOTE — ED Provider Notes (Signed)
[facility] Provider Note   CSN: [id number] Arrival date & time: 10/08/20  1802     History Chief Complaint  Patient presents with   Shortness of Breath    Julia Dunn is a 50 y.o. female.  Patient is a 50 year old female with past medical history of diabetes, hypertension, asthma, obesity.  Patient presenting today for evaluation of shortness of breath.  She describes dyspnea on exertion and orthopnea which has been worsening over the past several weeks.  Patient works as a Engineer, building services and states that it is difficult to complete her tasks because she she becomes dyspneic.  She [place] the airport recently and having difficulty making it to the terminal.  She denies any fevers or chills.  She denies any chest pain.  She does report swelling in both legs.  She has Lasix listed as one of her medications which she is supposed to take as needed for swelling.  She does report taking this over the past several days without much relief.  The history is provided by the patient.  Shortness of Breath Severity:  Moderate Onset quality:  Gradual Duration:  3 weeks Timing:  Constant Progression:  Worsening Chronicity:  Recurrent Context: activity   Relieved by:  Nothing Worsened by:  Exertion (Lying flat)     Past Medical History:  Diagnosis Date   Anemia Dx 2013   Asthma    Bacterial vaginosis 3903   Complication of anesthesia    pt states was informed had difficult time awakening following hysterectomy    Depression    Depression    Diabetes mellitus Dx 2005   Fibroids 09/05/2011   08/08/2011: Anteverted uterus. Diffuse fibroid involvement. Left of midline fibroid with submucosal component. 3.4cm x 2.8cmx 3.4cm.    GERD (gastroesophageal reflux disease)    Gestational diabetes    H/O shortness of breath    pt states has improved since lossing weight; triggers currently are enviromental pt states can make it to top of stairs w/o having to  stop to catch her breath   Headache(784.0)    migraines   History of blood transfusion 2013/2014   History of bronchitis    Hx of blood clots    Hypertension Dx 2015   Kidney infection 2015   Menorrhagia 09/27/2010   Pneumonia    Postpartum depression    Pregnancy induced hypertension    Trichomonas     Patient Active Problem List   Diagnosis Date Noted   Closed displaced bimalleolar fracture of left ankle 01/04/2016   Abnormal EKG 06/22/2014   Abnormal CT scan of lung 06/22/2014   Dyspnea 06/19/2014   Microscopic hematuria 04/07/2014   Onychomycosis 05/31/2013   BI-RADS category 3 mammogram result 03/07/2013   Vitamin D insufficiency 08/13/2011   Morbid obesity ([place]) 08/10/2007   ALLERGIC RHINITIS, SEASONAL 07/14/2006   DM2 (diabetes mellitus, type 2) 05/28/2006   DEPRESSIVE DISORDER, NOS 05/28/2006   HTN (hypertension) 05/28/2006    Past Surgical History:  Procedure Laterality Date   ABDOMINAL HYSTERECTOMY N/A 11/03/2012   Procedure: HYSTERECTOMY ABDOMINAL;  Surgeon: Ena Dawley, MD;  Location: [facility];  Service: Gynecology;  Laterality: N/A;   BILATERAL SALPINGECTOMY Bilateral 11/03/2012   Procedure: BILATERAL SALPINGECTOMY;  Surgeon: Ena Dawley, MD;  Location: [facility];  Service: Gynecology;  Laterality: Bilateral;   BURCH PROCEDURE N/A 11/03/2012   Procedure: BURCH PROCEDURE;  Surgeon: Ena Dawley, MD;  Location: [facility];  Service: Gynecology;  Laterality: N/A;   CYSTOSCOPY N/A 11/03/2012  Procedure: CYSTOSCOPY;  Surgeon: Ena Dawley, MD;  Location: [facility];  Service: Gynecology;  Laterality: N/A;   DILATION AND CURETTAGE OF UTERUS     LAPAROSCOPY N/A 11/03/2012   Procedure: LAPAROSCOPY DIAGNOSTIC;  Surgeon: Ena Dawley, MD;  Location: [facility];  Service: Gynecology;  Laterality: N/A;   ORIF ANKLE FRACTURE Left 01/04/2016   Procedure: OPEN REDUCTION INTERNAL FIXATION (ORIF) LEFT BIMALLEOLAR ANKLE FRACTURE;  Surgeon: Mcarthur Rossetti, MD;  Location: [facility];   Service: Orthopedics;  Laterality: Left;   TUBAL LIGATION     WISDOM TOOTH EXTRACTION     3 teeth     OB History     Gravida  6   Para  3   Term  3   Preterm      AB  3   Living  3      SAB      IAB  3   Ectopic      Multiple      Live Births  3           Family History  Problem Relation Age of Onset   Hypertension Brother    Stroke Brother    Sickle cell trait Daughter    Hypertension Father    Heart disease Mother    Rheumatic fever Mother    Hypertension Mother    Stroke Mother    CAD Mother    Heart disease Paternal Aunt     Social History   Tobacco Use   Smoking status: Never   Smokeless tobacco: Never  Substance Use Topics   Alcohol use: Yes    Comment: rarely   Drug use: No    Home Medications Prior to Admission medications   Medication Sig Start Date End Date Taking? Authorizing Provider  albuterol (VENTOLIN HFA) 108 (90 Base) MCG/ACT inhaler Inhale 2 puffs into the lungs every 6 (six) hours as needed for wheezing or shortness of breath. 10/20/19   Thereasa Solo, Dionne Bucy, PA-C  albuterol (VENTOLIN HFA) 108 (90 Base) MCG/ACT inhaler Inhale 1-2 puffs into the lungs every 6 (six) hours as needed for wheezing or shortness of breath. 10/20/19   Thereasa Solo, Dionne Bucy, PA-C  amLODipine (NORVASC) 5 MG tablet TAKE 1 TABLET (5 MG TOTAL) BY MOUTH DAILY. 10/20/19 11/07/20  Argentina Donovan, PA-C  atorvastatin (LIPITOR) 20 MG tablet Take 1 tablet (20 mg total) by mouth daily after supper. 10/20/19   Argentina Donovan, PA-C  benzonatate (TESSALON) 100 MG capsule Take 1 capsule (100 mg total) by mouth every 8 (eight) hours. 09/15/19   Suzy Bouchard, PA-C  cephALEXin (KEFLEX) 500 MG capsule TAKE 1 CAPSULE (500 MG TOTAL) BY MOUTH 2 (TWO) TIMES DAILY FOR 7 DAYS. 05/05/20 05/05/21  Marcello Fennel, PA-C  diclofenac Sodium (VOLTAREN) 1 % GEL Apply 4 g topically 4 (four) times daily as needed. 03/14/19   Hilts, Legrand Como, MD  FLUoxetine (PROZAC) 10 MG capsule Take 1  capsule (10 mg total) by mouth daily. 04/21/19   Fulp, Cammie, MD  furosemide (LASIX) 20 MG tablet Take 1 tablet (20 mg total) by mouth daily. 10/08/20   Burnell Blanks, MD  gabapentin (NEURONTIN) 300 MG capsule 1 PO q HS, may increase to 1 PO TID if needed. 04/21/19   Fulp, Cammie, MD  insulin glargine (LANTUS) 100 UNIT/ML Solostar Pen INJECT 14 UNITS INTO THE SKIN DAILY. 10/20/19 10/29/20  Argentina Donovan, PA-C  Insulin Pen Needle (TRUEPLUS PEN NEEDLES) 32G X 4 MM MISC Use  to inject insulin daily. 04/21/19   Fulp, Cammie, MD  lisinopril-hydrochlorothiazide (ZESTORETIC) 20-12.5 MG tablet Take 2 tablets by mouth daily. 10/20/19   Argentina Donovan, PA-C  metFORMIN (GLUCOPHAGE-XR) 500 MG 24 hr tablet Take 2 tablets (1,000 mg total) by mouth 2 (two) times daily. 10/20/19   Argentina Donovan, PA-C  naproxen (NAPROSYN) 500 MG tablet Take 1 tablet (500 mg total) by mouth 2 (two) times daily with a meal. As needed for pain/headache; take after eating 04/21/19   Fulp, Cammie, MD  naproxen sodium (ANAPROX) 220 MG tablet Take 220 mg by mouth daily as needed (pain).     [provider]  nitroGLYCERIN (NITRO-DUR) 0.1 mg/hr patch Apply 1/4 patch to heel area 12 hours daily 03/14/19   Hilts, Michael, MD  omeprazole (PRILOSEC) 20 MG capsule TAKE 1 CAPSULE (20 MG TOTAL) BY MOUTH DAILY. 05/05/20 05/05/21  Marcello Fennel, PA-C  ondansetron (ZOFRAN) 4 MG tablet TAKE 1 TABLET (4 MG TOTAL) BY MOUTH EVERY 8 (EIGHT) HOURS AS NEEDED FOR NAUSEA OR VOMITING. 05/05/20 05/05/21  Marcello Fennel, PA-C  ONE TOUCH ULTRA TEST test strip USE  STRIP TO CHECK GLUCOSE THREE TIMES DAILY BEFORE MEAL(S) 10/23/16   Charlott Rakes, MD  PRESCRIPTION MEDICATION Take 0.5 tablets by mouth See admin instructions. 1/2 tablet of Bumetanide.    [provider]  valACYclovir (VALTREX) 1000 MG tablet Take 1 tablet (1,000 mg total) by mouth 2 (two) times daily. 10/25/19   Gildardo Pounds, NP    Allergies    Other  Review of  Systems   Review of Systems  Respiratory:  Positive for shortness of breath.   All other systems reviewed and are negative.  Physical Exam Updated Vital Signs BP (!) 179/113 (BP Location: Right Arm)   Pulse 87   Temp 98.8 F (37.1 C) (Oral)   Resp 20   Ht 5\' 5"  (1.651 m)   Wt 106.6 kg   LMP 06/29/2012   SpO2 99%   BMI 39.11 kg/m   Physical Exam Vitals and nursing note reviewed.  Constitutional:      General: She is not in acute distress.    Appearance: She is well-developed. She is not diaphoretic.  HENT:     Head: Normocephalic and atraumatic.  Cardiovascular:     Rate and Rhythm: Normal rate and regular rhythm.     Heart sounds: No murmur heard.   No friction rub. No gallop.  Pulmonary:     Effort: Pulmonary effort is normal. No respiratory distress.     Breath sounds: Examination of the right-lower field reveals rales. Examination of the left-lower field reveals rales. Rales present. No wheezing.     Comments: There are slight rales in the bases bilaterally. Abdominal:     General: Bowel sounds are normal. There is no distension.     Palpations: Abdomen is soft.     Tenderness: There is no abdominal tenderness.  Musculoskeletal:        General: Normal range of motion.     Cervical back: Normal range of motion and neck supple.     Right lower leg: Edema present.     Left lower leg: Edema present.     Comments: There is 1+ pitting edema of both lower extremities.  Skin:    General: Skin is warm and dry.  Neurological:     General: No focal deficit present.     Mental Status: She is alert and oriented to person, place, and time.  ED Results / Procedures / Treatments   Labs (all labs ordered are listed, but only abnormal results are displayed) Labs Reviewed  BASIC METABOLIC PANEL - Abnormal; Notable for the following components:      Result Value   Glucose, Bld 205 (*)    Calcium 8.4 (*)    All other components within normal limits  BRAIN NATRIURETIC  PEPTIDE - Abnormal; Notable for the following components:   B Natriuretic Peptide 161.6 (*)    All other components within normal limits  TROPONIN I (HIGH SENSITIVITY) - Abnormal; Notable for the following components:   Troponin I (High Sensitivity) 28 (*)    All other components within normal limits  CBC  PREGNANCY, URINE  TROPONIN I (HIGH SENSITIVITY)    EKG EKG Interpretation  Date/Time:  Monday October 08 2020 19:19:35 EDT Ventricular Rate:  81 PR Interval:  168 QRS Duration: 90 QT Interval:  420 QTC Calculation: 487 R Axis:   101 Text Interpretation: Normal sinus rhythm Possible Left atrial enlargement Lateral infarct , age undetermined Abnormal ECG No significant change since 05/05/2020 Confirmed by Veryl Speak [phone]) on 10/08/2020 11:44:15 PM  Radiology DG Chest 2 View  Result Date: 10/08/2020 CLINICAL DATA:  Shortness of breath EXAM: CHEST - 2 VIEW COMPARISON:  05/05/2020 FINDINGS: Mild cardiomegaly. No focal airspace consolidation or pulmonary edema. Pleural spaces are normal. IMPRESSION: Mild cardiomegaly without focal airspace disease. Electronically Signed   By: Ulyses Jarred M.D.   On: 10/08/2020 20:23    Procedures Procedures   Medications Ordered in ED Medications  furosemide (LASIX) injection 40 mg (has no administration in time range)    ED Course  I have reviewed the triage vital signs and the nursing notes.  Pertinent labs & imaging results that were available during my care of the patient were reviewed by me and considered in my medical decision making (see chart for details).    MDM Rules/Calculators/A&P  Patient presenting with dyspnea and orthopnea worsening over the past month.  She also has swelling in her legs.  She appears to be retaining fluid.  Her chest x-ray shows mild cardiomegaly but no overt pulmonary edema.  She does have a small elevation of her BNP.  Troponin and EKG negative.  At this point, discharge seems appropriate.  She was given  IV Lasix here with a good diuresis.  Patient takes 20 mg of Lasix as needed at home.  I will have her take 40 mg daily for the next week, then follow-up with cardiology in the next week.  Final Clinical Impression(s) / ED Diagnoses Final diagnoses:  None    Rx / DC Orders ED Discharge Orders     None        Veryl Speak, MD 10/09/20 [phone]

## 2020-10-08 NOTE — ED Triage Notes (Signed)
Pt arrives to ED with c/o of shortness of breath x1 month and worse over the last x2 days. Pt reports new onset orthopnea and dyspnea on exertion. States increased in abdomen and bilateral legs. Pt also reports oliguria and constipation over the past week.

## 2020-10-09 ENCOUNTER — Other Ambulatory Visit: Payer: Self-pay

## 2020-10-09 LAB — TROPONIN I (HIGH SENSITIVITY): Troponin I (High Sensitivity): 27 ng/L — ABNORMAL HIGH (ref <18)

## 2020-10-09 LAB — PREGNANCY, URINE: Preg Test, Ur: NEGATIVE

## 2020-10-09 NOTE — Discharge Instructions (Addendum)
Increase your Lasix to 40 mg once daily for the next week.  Follow-up with your primary doctor/cardiologist in the next week, and return to the ER if symptoms significantly worsen or change.

## 2020-10-15 ENCOUNTER — Other Ambulatory Visit: Payer: Self-pay

## 2020-10-16 ENCOUNTER — Other Ambulatory Visit: Payer: Self-pay

## 2020-11-13 ENCOUNTER — Other Ambulatory Visit: Payer: Self-pay | Admitting: Physician Assistant

## 2020-11-13 ENCOUNTER — Other Ambulatory Visit: Payer: Self-pay

## 2020-11-13 DIAGNOSIS — E1165 Type 2 diabetes mellitus with hyperglycemia: Secondary | ICD-10-CM

## 2020-11-13 NOTE — Telephone Encounter (Signed)
Requested medications are due for refill today.  yes  Requested medications are on the active medications list.  yes  Last refill. 10/20/2019  Future visit scheduled.   no  Notes to clinic.  Prescription is expired. Also Julia Dunn is listed as PCP. Pt is more than 3 months overdue for OV.

## 2020-11-14 ENCOUNTER — Other Ambulatory Visit: Payer: Self-pay

## 2020-12-07 ENCOUNTER — Other Ambulatory Visit: Payer: Self-pay

## 2020-12-07 ENCOUNTER — Ambulatory Visit: Payer: Self-pay | Attending: Internal Medicine | Admitting: Internal Medicine

## 2020-12-07 VITALS — BP 166/87 | HR 78 | Ht 65.0 in | Wt 235.4 lb

## 2020-12-07 DIAGNOSIS — I152 Hypertension secondary to endocrine disorders: Secondary | ICD-10-CM

## 2020-12-07 DIAGNOSIS — E1169 Type 2 diabetes mellitus with other specified complication: Secondary | ICD-10-CM

## 2020-12-07 DIAGNOSIS — E785 Hyperlipidemia, unspecified: Secondary | ICD-10-CM

## 2020-12-07 DIAGNOSIS — Z23 Encounter for immunization: Secondary | ICD-10-CM

## 2020-12-07 DIAGNOSIS — Z1211 Encounter for screening for malignant neoplasm of colon: Secondary | ICD-10-CM

## 2020-12-07 DIAGNOSIS — F331 Major depressive disorder, recurrent, moderate: Secondary | ICD-10-CM

## 2020-12-07 DIAGNOSIS — E1159 Type 2 diabetes mellitus with other circulatory complications: Secondary | ICD-10-CM

## 2020-12-07 DIAGNOSIS — E669 Obesity, unspecified: Secondary | ICD-10-CM

## 2020-12-07 LAB — POCT GLYCOSYLATED HEMOGLOBIN (HGB A1C): HbA1c, POC (controlled diabetic range): 8.6 % — AB (ref 0.0–7.0)

## 2020-12-07 LAB — GLUCOSE, POCT (MANUAL RESULT ENTRY): POC Glucose: 204 mg/dl — AB (ref 70–99)

## 2020-12-07 MED ORDER — FUROSEMIDE 20 MG PO TABS
20.0000 mg | ORAL_TABLET | Freq: Every day | ORAL | 0 refills | Status: DC
  Filled 2020-12-07 – 2021-04-18 (×3): qty 30, 30d supply, fill #0
  Filled 2021-07-03: qty 30, 30d supply, fill #1

## 2020-12-07 MED ORDER — FLUOXETINE HCL 10 MG PO CAPS
10.0000 mg | ORAL_CAPSULE | Freq: Every day | ORAL | 3 refills | Status: DC
  Filled 2020-12-07 – 2021-08-07 (×4): qty 30, 30d supply, fill #0
  Filled 2021-11-13: qty 30, 30d supply, fill #1

## 2020-12-07 MED ORDER — ATORVASTATIN CALCIUM 20 MG PO TABS
20.0000 mg | ORAL_TABLET | Freq: Every day | ORAL | 3 refills | Status: DC
  Filled 2020-12-07 – 2021-08-07 (×3): qty 30, 30d supply, fill #0
  Filled 2021-11-13: qty 30, 30d supply, fill #1

## 2020-12-07 MED ORDER — AMLODIPINE BESYLATE 5 MG PO TABS
ORAL_TABLET | Freq: Every day | ORAL | 3 refills | Status: DC
  Filled 2020-12-07 – 2021-04-18 (×3): qty 30, 30d supply, fill #0
  Filled 2021-07-03 – 2021-08-07 (×2): qty 30, 30d supply, fill #1
  Filled 2021-11-13: qty 30, 30d supply, fill #2

## 2020-12-07 MED ORDER — METFORMIN HCL ER 500 MG PO TB24
500.0000 mg | ORAL_TABLET | Freq: Two times a day (BID) | ORAL | 1 refills | Status: DC
  Filled 2020-12-07 – 2021-04-18 (×3): qty 60, 30d supply, fill #0
  Filled 2021-07-03 – 2021-08-07 (×2): qty 60, 30d supply, fill #1
  Filled 2021-11-13: qty 60, 30d supply, fill #2

## 2020-12-07 MED ORDER — INSULIN GLARGINE 100 UNIT/ML SOLOSTAR PEN
PEN_INJECTOR | SUBCUTANEOUS | 11 refills | Status: DC
  Filled 2020-12-07: qty 3, 21d supply, fill #0
  Filled 2021-01-02 – 2021-04-18 (×2): qty 6, 40d supply, fill #0
  Filled 2021-07-03 – 2021-08-07 (×2): qty 6, 40d supply, fill #1
  Filled 2021-11-13: qty 6, 40d supply, fill #2

## 2020-12-07 MED ORDER — LISINOPRIL-HYDROCHLOROTHIAZIDE 20-12.5 MG PO TABS
2.0000 | ORAL_TABLET | Freq: Every day | ORAL | 1 refills | Status: DC
  Filled 2020-12-07 – 2021-08-07 (×4): qty 60, 30d supply, fill #0
  Filled 2021-11-13: qty 60, 30d supply, fill #1

## 2020-12-07 NOTE — Progress Notes (Addendum)
Patient ID: Julia Dunn, female    DOB: 11-Nov-1970  MRN: [id number]  CC: Chronic disease management  Subjective: Julia Dunn is a 50 y.o. female who presents for chronic ds management.  Previous PCP was Dr. Chapman Dunn who is no longer with the practice. Her concerns today include:  Pt with hx of HTN, DM 2, obesity  HYPERTENSION Currently taking: see medication list Med Adherence: '[]'$  Yes    '[x]'$  No - was not taking consistently and has been out of both medications for a while.  She is supposed to be on lisinopril/HCTZ and amlodipine.  She is also requesting refill on furosemide which she takes to help decrease lower extremity edema. Medication side effects: '[]'$  Yes    '[x]'$  No Adherence with salt restriction: '[x]'$  Yes    '[]'$  No Home Monitoring?: '[x]'$  Yes    '[]'$  No Monitoring Frequency: 1-2x/wk Home BP results range: 150/90s SOB? '[x]'$  Yes    '[]'$  No Chest Pain?: '[]'$  Yes    '[x]'$  No Leg swelling?: '[]'$  Yes    '[x]'$  No Headaches?: '[]'$  Yes    '[x]'$  No Dizziness? '[]'$  Yes    '[x]'$  No Comments:   DIABETES TYPE 2 Last A1C:   Results for orders placed or performed in visit on 12/07/20  POCT glucose (manual entry)  Result Value Ref Range   POC Glucose 204 (A) 70 - 99 mg/dl  POCT glycosylated hemoglobin (Hb A1C)  Result Value Ref Range   Hemoglobin A1C     HbA1c POC (<> result, manual entry)     HbA1c, POC (prediabetic range)     HbA1c, POC (controlled diabetic range) 8.6 (A) 0.0 - 7.0 %    Med Adherence:  '[]'$  Yes    '[x]'$  No -taking only 5 units Lantus to make it last until she got in today.  Suppose to be on 14 units daily.  Out of Metformin but reports she can only tolerate 500 mg BID not 1gram BID due to diarrhea Medication side effects:  '[x]'$  Yes    '[]'$  No Home Monitoring?  '[]'$  Yes    '[x]'$  No but does have device Home glucose results range: Diet Adherence: Gotten better.  Use to snack a lot but not any more.  White carb is the problem.  Exercise: '[x]'$  Yes -going to gym 2-3 x/wk   '[]'$  No Hypoglycemic episodes?: '[]'$   Yes    '[x]'$  No Numbness of the feet? '[]'$  Yes    '[x]'$  No Retinopathy hx? '[]'$  Yes    '[]'$  No Last eye exam: had eye exam 02/2021 at [facility] Comments:   HL: Not consistent with taking atorvastatin but requests refill.  Needs RF Prozac.  Admits that she was not taking it consistently but states that it is very helpful in controlling depression.    HM: Due for colon cancer screening.  Due for flu shot. Patient Active Problem List   Diagnosis Date Noted   Closed displaced bimalleolar fracture of left ankle 01/04/2016   Abnormal EKG 06/22/2014   Abnormal CT scan of lung 06/22/2014   Dyspnea 06/19/2014   Microscopic hematuria 04/07/2014   Onychomycosis 05/31/2013   BI-RADS category 3 mammogram result 03/07/2013   Vitamin D insufficiency 08/13/2011   Morbid obesity ([place]) 08/10/2007   ALLERGIC RHINITIS, SEASONAL 07/14/2006   DM2 (diabetes mellitus, type 2) 05/28/2006   DEPRESSIVE DISORDER, NOS 05/28/2006   HTN (hypertension) 05/28/2006     Current Outpatient Medications on File Prior to Visit  Medication Sig Dispense Refill  albuterol (VENTOLIN HFA) 108 (90 Base) MCG/ACT inhaler Inhale 2 puffs into the lungs every 6 (six) hours as needed for wheezing or shortness of breath. 18 g 3   albuterol (VENTOLIN HFA) 108 (90 Base) MCG/ACT inhaler Inhale 1-2 puffs into the lungs every 6 (six) hours as needed for wheezing or shortness of breath. 18 g 0   diclofenac Sodium (VOLTAREN) 1 % GEL Apply 4 g topically 4 (four) times daily as needed. 500 g 6   Insulin Pen Needle (TRUEPLUS PEN NEEDLES) 32G X 4 MM MISC Use to inject insulin daily. 100 each 11   naproxen (NAPROSYN) 500 MG tablet Take 1 tablet (500 mg total) by mouth 2 (two) times daily with a meal. As needed for pain/headache; take after eating 60 tablet 2   nitroGLYCERIN (NITRO-DUR) 0.1 mg/hr patch Apply 1/4 patch to heel area 12 hours daily 30 patch 3   omeprazole (PRILOSEC) 20 MG capsule TAKE 1 CAPSULE (20 MG TOTAL) BY MOUTH DAILY. 30 capsule  0   ondansetron (ZOFRAN) 4 MG tablet TAKE 1 TABLET (4 MG TOTAL) BY MOUTH EVERY 8 (EIGHT) HOURS AS NEEDED FOR NAUSEA OR VOMITING. 12 tablet 0   ONE TOUCH ULTRA TEST test strip USE  STRIP TO CHECK GLUCOSE THREE TIMES DAILY BEFORE MEAL(S) 100 each 5   PRESCRIPTION MEDICATION Take 0.5 tablets by mouth See admin instructions. 1/2 tablet of Bumetanide.     valACYclovir (VALTREX) 1000 MG tablet Take 1 tablet (1,000 mg total) by mouth 2 (two) times daily. 20 tablet 0   No current facility-administered medications on file prior to visit.    Allergies  Allergen Reactions   Other Swelling    Patient says she is unsure of caused the swelling but one of the meds given to her on the 8/6 for her surgery had her extreamly swollen     Social History   Socioeconomic History   Marital status: Single    Spouse name: Not on file   Number of children: 3   Years of education: Not on file   Highest education level: Not on file  Occupational History   Occupation: Unemployed  Tobacco Use   Smoking status: Never   Smokeless tobacco: Never  Substance and Sexual Activity   Alcohol use: Yes    Comment: rarely   Drug use: No   Sexual activity: Yes    Birth control/protection: Surgical    Comment: hysterectomy  Other Topics Concern   Not on file  Social History Narrative   Not on file   Social Determinants of Health   Financial Resource Strain: Not on file  Food Insecurity: Not on file  Transportation Needs: Not on file  Physical Activity: Not on file  Stress: Not on file  Social Connections: Not on file  Intimate Partner Violence: Not on file    Family History  Problem Relation Age of Onset   Hypertension Brother    Stroke Brother    Sickle cell trait Daughter    Hypertension Father    Heart disease Mother    Rheumatic fever Mother    Hypertension Mother    Stroke Mother    CAD Mother    Heart disease Paternal Aunt     Past Surgical History:  Procedure Laterality Date   ABDOMINAL  HYSTERECTOMY N/A 11/03/2012   Procedure: HYSTERECTOMY ABDOMINAL;  Surgeon: Ena Dawley, MD;  Location: [facility];  Service: Gynecology;  Laterality: N/A;   BILATERAL SALPINGECTOMY Bilateral 11/03/2012   Procedure: BILATERAL SALPINGECTOMY;  Surgeon: Ena Dawley, MD;  Location: [facility];  Service: Gynecology;  Laterality: Bilateral;   BURCH PROCEDURE N/A 11/03/2012   Procedure: BURCH PROCEDURE;  Surgeon: Ena Dawley, MD;  Location: [facility];  Service: Gynecology;  Laterality: N/A;   CYSTOSCOPY N/A 11/03/2012   Procedure: CYSTOSCOPY;  Surgeon: Ena Dawley, MD;  Location: [facility];  Service: Gynecology;  Laterality: N/A;   DILATION AND CURETTAGE OF UTERUS     LAPAROSCOPY N/A 11/03/2012   Procedure: LAPAROSCOPY DIAGNOSTIC;  Surgeon: Ena Dawley, MD;  Location: [facility];  Service: Gynecology;  Laterality: N/A;   ORIF ANKLE FRACTURE Left 01/04/2016   Procedure: OPEN REDUCTION INTERNAL FIXATION (ORIF) LEFT BIMALLEOLAR ANKLE FRACTURE;  Surgeon: Mcarthur Rossetti, MD;  Location: [facility];  Service: Orthopedics;  Laterality: Left;   TUBAL LIGATION     WISDOM TOOTH EXTRACTION     3 teeth    ROS: Review of Systems Negative except as stated above  PHYSICAL EXAM: BP (!) 166/87   Pulse 78   Ht '5\' 5"'$  (1.651 m)   Wt 235 lb 6.4 oz (106.8 kg)   LMP 06/29/2012   SpO2 94%   BMI 39.17 kg/m   Wt Readings from Last 3 Encounters:  12/07/20 235 lb 6.4 oz (106.8 kg)  10/08/20 235 lb (106.6 kg)  12/09/19 244 lb 6.4 oz (110.9 kg)    Physical Exam  General appearance - alert, well appearing, middle-aged obese African-American female and in no distress Mental status - normal mood, behavior, speech, dress, motor activity, and thought processes Eyes - pupils equal and reactive, extraocular eye movements intact Neck - supple, no significant adenopathy Chest - clear to auscultation, no wheezes, rales or rhonchi, symmetric air entry Heart - normal rate, regular rhythm, normal S1, S2, no murmurs, rubs, clicks  or gallops Extremities - peripheral pulses normal, no pedal edema, no clubbing or cyanosis Diabetic Foot Exam - Simple   Simple Foot Form Visual Inspection No deformities, no ulcerations, no other skin breakdown bilaterally: Yes Sensation Testing Intact to touch and monofilament testing bilaterally: Yes Pulse Check Posterior Tibialis and Dorsalis pulse intact bilaterally: Yes Comments      Depression screen [facility] 2/9 12/07/2020 10/20/2019 04/21/2019  Decreased Interest '1 1 1  '$ Down, Depressed, Hopeless '1 1 1  '$ PHQ - 2 Score '2 2 2  '$ Altered sleeping 0 1 1  Tired, decreased energy 2 0 1  Change in appetite 1 0 1  Feeling bad or failure about yourself  2 0 2  Trouble concentrating 0 0 0  Moving slowly or fidgety/restless 0 0 0  Suicidal thoughts 0 0 0  PHQ-9 Score '7 3 7    '$ CMP Latest Ref Rng & Units 10/08/2020 05/05/2020 05/05/2020  Glucose 70 - 99 mg/dL 205(H) 263(H) 271(H)  BUN 6 - 20 mg/dL '14 11 12  '$ Creatinine 0.44 - 1.00 mg/dL 0.84 0.97 0.89  Sodium 135 - 145 mmol/L 140 136 136  Potassium 3.5 - 5.1 mmol/L 4.0 4.0 3.8  Chloride 98 - 111 mmol/L 107 100 100  CO2 22 - 32 mmol/L '26 25 24  '$ Calcium 8.9 - 10.3 mg/dL 8.4(L) 8.8(L) 9.0  Total Protein 6.5 - 8.1 g/dL - 6.2(L) -  Total Bilirubin 0.3 - 1.2 mg/dL - 0.7 -  Alkaline Phos 38 - 126 U/L - 75 -  AST 15 - 41 U/L - 20 -  ALT 0 - 44 U/L - 24 -   Lipid Panel     Component Value Date/Time   CHOL  141 08/31/2015 1027   TRIG 117 08/31/2015 1027   HDL 49 08/31/2015 1027   CHOLHDL 2.9 08/31/2015 1027   VLDL 23 08/31/2015 1027   LDLCALC 69 08/31/2015 1027    CBC    Component Value Date/Time   WBC 8.1 10/08/2020 1915   RBC 4.79 10/08/2020 1915   HGB 13.7 10/08/2020 1915   HGB 16.0 (H) 10/20/2019 1624   HCT 42.1 10/08/2020 1915   HCT 47.0 (H) 10/20/2019 1624   PLT 186 10/08/2020 1915   PLT 214 10/20/2019 1624   MCV 87.9 10/08/2020 1915   MCV 87 10/20/2019 1624   MCH 28.6 10/08/2020 1915   MCHC 32.5 10/08/2020 1915   RDW 13.1  10/08/2020 1915   RDW 13.0 10/20/2019 1624   LYMPHSABS 3.1 10/20/2019 1624   MONOABS 1.4 (H) 01/01/2014 1734   EOSABS 0.1 10/20/2019 1624   BASOSABS 0.1 10/20/2019 1624    ASSESSMENT AND PLAN:  1. Type 2 diabetes mellitus with obesity ([place]) Stressed importance of compliance with medications to better control her diabetes.  We will decrease the metformin to 500 mg twice a day.  Continue Lantus 14 units daily.  Discussed and encourage healthy eating habits.  Printed information given.  Commended her on regular exercise. - POCT glucose (manual entry) - POCT glycosylated hemoglobin (Hb A1C) - Microalbumin / creatinine urine ratio - insulin glargine (LANTUS) 100 UNIT/ML Solostar Pen; INJECT 14 UNITS INTO THE SKIN DAILY.  Dispense: 15 mL; Refill: 11 - metFORMIN (GLUCOPHAGE-XR) 500 MG 24 hr tablet; Take 1 tablet (500 mg total) by mouth 2 (two) times daily.  Dispense: 180 tablet; Refill: 1  2. Hypertension associated with diabetes ([place]) Not at goal.  Encouraged her to be consistent with taking her medicine to prevent cardiovascular risks of uncontrolled blood pressure. - lisinopril-hydrochlorothiazide (ZESTORETIC) 20-12.5 MG tablet; Take 2 tablets by mouth daily.  Dispense: 180 tablet; Refill: 1 - amLODipine (NORVASC) 5 MG tablet; TAKE 1 TABLET (5 MG TOTAL) BY MOUTH DAILY.  Dispense: 90 tablet; Refill: 3 - furosemide (LASIX) 20 MG tablet; Take 1 tablet (20 mg total) by mouth daily.  Dispense: 90 tablet; Refill: 0  3. Hyperlipidemia due to type 2 diabetes mellitus (HCC) - Lipid panel - Hepatic Function Panel - atorvastatin (LIPITOR) 20 MG tablet; Take 1 tablet (20 mg total) by mouth daily after supper.  Dispense: 30 tablet; Refill: 3  4. Depression, major, recurrent, moderate (HCC) - FLUoxetine (PROZAC) 10 MG capsule; Take 1 capsule (10 mg total) by mouth daily.  Dispense: 60 capsule; Refill: 3  5. Screening for colon cancer - Fecal occult blood, imunochemical([facility])  6. Need for  immunization against influenza - Flu Vaccine QUAD 18moIM (Fluarix, Fluzone & Alfiuria Quad PF)    Patient was given the opportunity to ask questions.  Patient verbalized understanding of the plan and was able to repeat key elements of the plan.   Orders Placed This Encounter  Procedures   Fecal occult blood, imunochemical([facility])   Flu Vaccine QUAD 636moM (Fluarix, Fluzone & Alfiuria Quad PF)   Microalbumin / creatinine urine ratio   Lipid panel   Hepatic Function Panel   POCT glucose (manual entry)   POCT glycosylated hemoglobin (Hb A1C)     Requested Prescriptions   Signed Prescriptions Disp Refills   lisinopril-hydrochlorothiazide (ZESTORETIC) 20-12.5 MG tablet 180 tablet 1    Sig: Take 2 tablets by mouth daily.   amLODipine (NORVASC) 5 MG tablet 90 tablet 3    Sig: TAKE 1 TABLET (5 MG  TOTAL) BY MOUTH DAILY.   insulin glargine (LANTUS) 100 UNIT/ML Solostar Pen 15 mL 11    Sig: INJECT 14 UNITS INTO THE SKIN DAILY.   metFORMIN (GLUCOPHAGE-XR) 500 MG 24 hr tablet 180 tablet 1    Sig: Take 1 tablet (500 mg total) by mouth 2 (two) times daily.   FLUoxetine (PROZAC) 10 MG capsule 60 capsule 3    Sig: Take 1 capsule (10 mg total) by mouth daily.   furosemide (LASIX) 20 MG tablet 90 tablet 0    Sig: Take 1 tablet (20 mg total) by mouth daily.   atorvastatin (LIPITOR) 20 MG tablet 30 tablet 3    Sig: Take 1 tablet (20 mg total) by mouth daily after supper.    Return in about 4 months (around 04/08/2021).  Karle Plumber, MD, FACP

## 2020-12-07 NOTE — Patient Instructions (Signed)
Diabetes Mellitus and Nutrition, Adult When you have diabetes, or diabetes mellitus, it is very important to have healthy eating habits because your blood sugar (glucose) levels are greatly affected by what you eat and drink. Eating healthy foods in the right amounts, at about the same times every day, can help you:  Control your blood glucose.  Lower your risk of heart disease.  Improve your blood pressure.  Reach or maintain a healthy weight. What can affect my meal plan? Every person with diabetes is different, and each person has different needs for a meal plan. Your health care provider may recommend that you work with a dietitian to make a meal plan that is best for you. Your meal plan may vary depending on factors such as:  The calories you need.  The medicines you take.  Your weight.  Your blood glucose, blood pressure, and cholesterol levels.  Your activity level.  Other health conditions you have, such as heart or kidney disease. How do carbohydrates affect me? Carbohydrates, also called carbs, affect your blood glucose level more than any other type of food. Eating carbs naturally raises the amount of glucose in your blood. Carb counting is a method for keeping track of how many carbs you eat. Counting carbs is important to keep your blood glucose at a healthy level, especially if you use insulin or take certain oral diabetes medicines. It is important to know how many carbs you can safely have in each meal. This is different for every person. Your dietitian can help you calculate how many carbs you should have at each meal and for each snack. How does alcohol affect me? Alcohol can cause a sudden decrease in blood glucose (hypoglycemia), especially if you use insulin or take certain oral diabetes medicines. Hypoglycemia can be a life-threatening condition. Symptoms of hypoglycemia, such as sleepiness, dizziness, and confusion, are similar to symptoms of having too much  alcohol.  Do not drink alcohol if: ? Your health care provider tells you not to drink. ? You are pregnant, may be pregnant, or are planning to become pregnant.  If you drink alcohol: ? Do not drink on an empty stomach. ? Limit how much you use to:  0-1 drink a day for women.  0-2 drinks a day for men. ? Be aware of how much alcohol is in your drink. In the [place] one drink equals one 12 oz bottle of beer (355 mL), one 5 oz glass of wine (148 mL), or one 1 oz glass of hard liquor (44 mL). ? Keep yourself hydrated with water, diet soda, or unsweetened iced tea.  Keep in mind that regular soda, juice, and other mixers may contain a lot of sugar and must be counted as carbs. What are tips for following this plan? Reading food labels  Start by checking the serving size on the "Nutrition Facts" label of packaged foods and drinks. The amount of calories, carbs, fats, and other nutrients listed on the label is based on one serving of the item. Many items contain more than one serving per package.  Check the total grams (g) of carbs in one serving. You can calculate the number of servings of carbs in one serving by dividing the total carbs by 15. For example, if a food has 30 g of total carbs per serving, it would be equal to 2 servings of carbs.  Check the number of grams (g) of saturated fats and trans fats in one serving. Choose foods that have   a low amount or none of these fats.  Check the number of milligrams (mg) of salt (sodium) in one serving. Most people should limit total sodium intake to less than 2,300 mg per day.  Always check the nutrition information of foods labeled as "low-fat" or "nonfat." These foods may be higher in added sugar or refined carbs and should be avoided.  Talk to your dietitian to identify your daily goals for nutrients listed on the label. Shopping  Avoid buying canned, pre-made, or processed foods. These foods tend to be high in fat, sodium, and added  sugar.  Shop around the outside edge of the grocery store. This is where you will most often find fresh fruits and vegetables, bulk grains, fresh meats, and fresh dairy. Cooking  Use low-heat cooking methods, such as baking, instead of high-heat cooking methods like deep frying.  Cook using healthy oils, such as olive, canola, or sunflower oil.  Avoid cooking with butter, cream, or high-fat meats. Meal planning  Eat meals and snacks regularly, preferably at the same times every day. Avoid going long periods of time without eating.  Eat foods that are high in fiber, such as fresh fruits, vegetables, beans, and whole grains. Talk with your dietitian about how many servings of carbs you can eat at each meal.  Eat 4-6 oz (112-168 g) of lean protein each day, such as lean meat, chicken, fish, eggs, or tofu. One ounce (oz) of lean protein is equal to: ? 1 oz (28 g) of meat, chicken, or fish. ? 1 egg. ?  cup (62 g) of tofu.  Eat some foods each day that contain healthy fats, such as avocado, nuts, seeds, and fish.   What foods should I eat? Fruits Berries. Apples. Oranges. Peaches. Apricots. Plums. Grapes. Mango. Papaya. Pomegranate. Kiwi. Cherries. Vegetables Lettuce. Spinach. Leafy greens, including kale, chard, collard greens, and mustard greens. Beets. Cauliflower. Cabbage. Broccoli. Carrots. Green beans. Tomatoes. Peppers. Onions. Cucumbers. Brussels sprouts. Grains Whole grains, such as whole-wheat or whole-grain bread, crackers, tortillas, cereal, and pasta. Unsweetened oatmeal. Quinoa. Brown or wild rice. Meats and other proteins Seafood. Poultry without skin. Lean cuts of poultry and beef. Tofu. Nuts. Seeds. Dairy Low-fat or fat-free dairy products such as milk, yogurt, and cheese. The items listed above may not be a complete list of foods and beverages you can eat. Contact a dietitian for more information. What foods should I avoid? Fruits Fruits canned with  syrup. Vegetables Canned vegetables. Frozen vegetables with butter or cream sauce. Grains Refined white flour and flour products such as bread, pasta, snack foods, and cereals. Avoid all processed foods. Meats and other proteins Fatty cuts of meat. Poultry with skin. Breaded or fried meats. Processed meat. Avoid saturated fats. Dairy Full-fat yogurt, cheese, or milk. Beverages Sweetened drinks, such as soda or iced tea. The items listed above may not be a complete list of foods and beverages you should avoid. Contact a dietitian for more information. Questions to ask a health care provider  Do I need to meet with a diabetes educator?  Do I need to meet with a dietitian?  What number can I call if I have questions?  When are the best times to check my blood glucose? Where to find more information:  American Diabetes Association: diabetes.org  Academy of Nutrition and Dietetics: www.eatright.org  National Institute of Diabetes and Digestive and Kidney Diseases: www.niddk.nih.gov  Association of Diabetes Care and Education Specialists: www.diabeteseducator.org Summary  It is important to have healthy eating   habits because your blood sugar (glucose) levels are greatly affected by what you eat and drink.  A healthy meal plan will help you control your blood glucose and maintain a healthy lifestyle.  Your health care provider may recommend that you work with a dietitian to make a meal plan that is best for you.  Keep in mind that carbohydrates (carbs) and alcohol have immediate effects on your blood glucose levels. It is important to count carbs and to use alcohol carefully. This information is not intended to replace advice given to you by your health care provider. Make sure you discuss any questions you have with your health care provider. Document Revised: 02/22/2019 Document Reviewed: 02/22/2019 Elsevier Patient Education  2021 Elsevier Inc.  

## 2020-12-08 LAB — HEPATIC FUNCTION PANEL
ALT: 23 IU/L (ref 0–32)
AST: 15 IU/L (ref 0–40)
Albumin: 4.7 g/dL (ref 3.8–4.8)
Alkaline Phosphatase: 77 IU/L (ref 44–121)
Bilirubin Total: 0.5 mg/dL (ref 0.0–1.2)
Bilirubin, Direct: 0.14 mg/dL (ref 0.00–0.40)
Total Protein: 6.9 g/dL (ref 6.0–8.5)

## 2020-12-08 LAB — LIPID PANEL
Chol/HDL Ratio: 3 ratio (ref 0.0–4.4)
Cholesterol, Total: 164 mg/dL (ref 100–199)
HDL: 55 mg/dL (ref >39)
LDL Chol Calc (NIH): 88 mg/dL (ref 0–99)
Triglycerides: 120 mg/dL (ref 0–149)
VLDL Cholesterol Cal: 21 mg/dL (ref 5–40)

## 2020-12-08 LAB — MICROALBUMIN / CREATININE URINE RATIO
Creatinine, Urine: 166 mg/dL
Microalb/Creat Ratio: 9 mg/g creat (ref 0–29)
Microalbumin, Urine: 15.5 ug/mL

## 2020-12-11 ENCOUNTER — Telehealth: Payer: Self-pay

## 2020-12-11 NOTE — Telephone Encounter (Signed)
Contacted pt to go over lab results pt didn't answer lvm asking pt to give me a call at her/his earliest convenience   Sent a CRM and forward labs to NT to give pt labs when they call back

## 2020-12-14 ENCOUNTER — Other Ambulatory Visit: Payer: Self-pay

## 2020-12-20 ENCOUNTER — Other Ambulatory Visit: Payer: Self-pay

## 2021-01-02 ENCOUNTER — Other Ambulatory Visit: Payer: Self-pay

## 2021-01-02 ENCOUNTER — Other Ambulatory Visit: Payer: Self-pay | Admitting: Internal Medicine

## 2021-01-02 MED ORDER — OMEPRAZOLE 20 MG PO CPDR
DELAYED_RELEASE_CAPSULE | Freq: Every day | ORAL | 0 refills | Status: AC
  Filled 2021-01-02 – 2021-08-07 (×2): qty 30, 30d supply, fill #0

## 2021-01-03 ENCOUNTER — Other Ambulatory Visit: Payer: Self-pay

## 2021-01-07 ENCOUNTER — Encounter: Payer: Self-pay | Admitting: Internal Medicine

## 2021-01-10 ENCOUNTER — Other Ambulatory Visit: Payer: Self-pay

## 2021-01-16 ENCOUNTER — Encounter: Payer: Self-pay | Admitting: Internal Medicine

## 2021-01-16 NOTE — Progress Notes (Signed)
Letter received from [facility] mammography.  Patient had mammogram 01/07/2021.  This revealed a possible 1 cm mass in the right breast.  Patient will be recalled for additional imaging.

## 2021-02-13 ENCOUNTER — Telehealth: Payer: Self-pay | Admitting: Internal Medicine

## 2021-02-13 NOTE — Telephone Encounter (Signed)
Phone call placed to patient this a.m.  Patient informed that I have received a letter from [facility] me that they have attempted to reach the patient requesting that she return for additional imaging on the right breast and to date she has not undergone the important follow-up. Patient tells me that she is aware that she has been recalled for additional imaging on the right breast.  However she is uninsured and pays out-of-pocket for the imaging study.  She was told that it would cost $250 for the additional imaging and she just does not have the funds at this time.  She states that she did touch base with them and told them that when she gets the fans she will return to have the additional imaging done.  Also patient states that she went through this the last time with them where they thought they saw something and she went back and it turned out to be nothing. I strongly advised patient to follow-up on this reminding her that the whole purpose of screening is to catch disease early.  If she does not get the follow-up study done and it turns out that this truly is a cancerous lesion, she runs the risks of it spreading making treatment more difficult.  Patient states she understood and that she will try to come up with the funds to get the additional study done.

## 2021-02-15 NOTE — Telephone Encounter (Signed)
Call placed to patient and she explained that she spoke to Dr Wynetta Emery and understands the importance of follow up regarding additional imaging that has been recommended however she does not have the money for the studies that need to be done. She said she does think that something is different this time than the last time she had imaging done. She said she is pulled in many directions right now, she know she needs to care for herself but she is trying to get her mother's care in place after her mother's amputation.  She is also trying to make sure that her children are being taken care of. When asked if she had applied for [employer]  she said that she was not able to produce a tax return in order to complete the application.  Explained to her that she may be able to qualify for some assistance through [facility] if she is approved for financial aid. She can speak with [facility] financial counselor for additional information and guidance if she wishes and she said she understood.  She also understood the importance of following through with the recommendations from her mammography report

## 2021-02-20 ENCOUNTER — Telehealth: Payer: Self-pay

## 2021-02-20 NOTE — Telephone Encounter (Signed)
Call placed to patient and informed her that there is a scholarship program, [employer], available that would cover diagnostic follow up at [facility] if she is eligible.  Explained to her that this CM can refer her to the program if she is interested.  She asked what it involved and this CM offered to refer her to the program for more information.  Financial information may be requested but it would be best to hear from a representative with the program itself.   She was not sure if she wanted to be referred. Explained to her that she can think about it and call this CM back if she is interested.

## 2021-04-18 ENCOUNTER — Other Ambulatory Visit: Payer: Self-pay

## 2021-04-24 ENCOUNTER — Other Ambulatory Visit: Payer: Self-pay

## 2021-06-26 ENCOUNTER — Emergency Department (HOSPITAL_COMMUNITY): Payer: No Typology Code available for payment source

## 2021-06-26 ENCOUNTER — Emergency Department (HOSPITAL_COMMUNITY)
Admission: EM | Admit: 2021-06-26 | Discharge: 2021-06-26 | Disposition: A | Discharge status: Home / Self Care | Payer: No Typology Code available for payment source | Attending: Emergency Medicine | Admitting: Emergency Medicine

## 2021-06-26 ENCOUNTER — Encounter (HOSPITAL_COMMUNITY): Payer: Self-pay | Admitting: Emergency Medicine

## 2021-06-26 DIAGNOSIS — Y92410 Unspecified street and highway as the place of occurrence of the external cause: Secondary | ICD-10-CM | POA: Insufficient documentation

## 2021-06-26 DIAGNOSIS — R519 Headache, unspecified: Secondary | ICD-10-CM | POA: Insufficient documentation

## 2021-06-26 DIAGNOSIS — I10 Essential (primary) hypertension: Secondary | ICD-10-CM

## 2021-06-26 DIAGNOSIS — Z79899 Other long term (current) drug therapy: Secondary | ICD-10-CM | POA: Diagnosis not present

## 2021-06-26 DIAGNOSIS — Z794 Long term (current) use of insulin: Secondary | ICD-10-CM | POA: Insufficient documentation

## 2021-06-26 DIAGNOSIS — S93601A Unspecified sprain of right foot, initial encounter: Secondary | ICD-10-CM | POA: Diagnosis not present

## 2021-06-26 DIAGNOSIS — S3992XA Unspecified injury of lower back, initial encounter: Secondary | ICD-10-CM | POA: Diagnosis present

## 2021-06-26 DIAGNOSIS — S20212A Contusion of left front wall of thorax, initial encounter: Secondary | ICD-10-CM

## 2021-06-26 DIAGNOSIS — S39012A Strain of muscle, fascia and tendon of lower back, initial encounter: Secondary | ICD-10-CM

## 2021-06-26 DIAGNOSIS — S46911A Strain of unspecified muscle, fascia and tendon at shoulder and upper arm level, right arm, initial encounter: Secondary | ICD-10-CM | POA: Diagnosis not present

## 2021-06-26 DIAGNOSIS — S20222A Contusion of left back wall of thorax, initial encounter: Secondary | ICD-10-CM | POA: Diagnosis not present

## 2021-06-26 MED ORDER — METHOCARBAMOL 500 MG PO TABS
750.0000 mg | ORAL_TABLET | Freq: Once | ORAL | Status: AC
  Administered 2021-06-26: 750 mg via ORAL
  Filled 2021-06-26: qty 2

## 2021-06-26 MED ORDER — ACETAMINOPHEN 500 MG PO TABS
1000.0000 mg | ORAL_TABLET | Freq: Once | ORAL | Status: AC
  Administered 2021-06-26: 1000 mg via ORAL
  Filled 2021-06-26: qty 2

## 2021-06-26 MED ORDER — METHOCARBAMOL 750 MG PO TABS
750.0000 mg | ORAL_TABLET | Freq: Three times a day (TID) | ORAL | 0 refills | Status: DC | PRN
  Filled 2021-06-26: qty 15, 5d supply, fill #0

## 2021-06-26 NOTE — Discharge Instructions (Addendum)
It was our pleasure to provide your ER care today - we hope that you feel better. ? ?Take acetaminophen as need for pain. You may also take robaxin as need for muscle pain/spasm - no driving when taking.  ? ?Your blood pressure is high - continue your meds, limit salt intake, and follow up with primary care doctor in one week. ? ?Return to ER if worse, new symptoms, new/severe pain, trouble breathing, severe abdominal pain, or other concern.  ?
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
## 2021-06-27 ENCOUNTER — Other Ambulatory Visit: Payer: Self-pay

## 2021-07-03 ENCOUNTER — Other Ambulatory Visit: Payer: Self-pay

## 2021-07-04 ENCOUNTER — Other Ambulatory Visit: Payer: Self-pay

## 2021-07-11 ENCOUNTER — Other Ambulatory Visit: Payer: Self-pay

## 2021-08-03 ENCOUNTER — Other Ambulatory Visit: Payer: Self-pay

## 2021-08-03 ENCOUNTER — Encounter (HOSPITAL_BASED_OUTPATIENT_CLINIC_OR_DEPARTMENT_OTHER): Payer: Self-pay

## 2021-08-03 ENCOUNTER — Emergency Department (HOSPITAL_BASED_OUTPATIENT_CLINIC_OR_DEPARTMENT_OTHER)
Admission: EM | Admit: 2021-08-03 | Discharge: 2021-08-03 | Discharge status: Against Medical Advice | Payer: Self-pay | Attending: Emergency Medicine | Admitting: Emergency Medicine

## 2021-08-03 ENCOUNTER — Emergency Department (HOSPITAL_BASED_OUTPATIENT_CLINIC_OR_DEPARTMENT_OTHER): Payer: Self-pay | Admitting: Radiology

## 2021-08-03 DIAGNOSIS — Z794 Long term (current) use of insulin: Secondary | ICD-10-CM | POA: Insufficient documentation

## 2021-08-03 DIAGNOSIS — R0602 Shortness of breath: Secondary | ICD-10-CM | POA: Insufficient documentation

## 2021-08-03 DIAGNOSIS — Z79899 Other long term (current) drug therapy: Secondary | ICD-10-CM | POA: Insufficient documentation

## 2021-08-03 DIAGNOSIS — I5040 Unspecified combined systolic (congestive) and diastolic (congestive) heart failure: Secondary | ICD-10-CM | POA: Insufficient documentation

## 2021-08-03 DIAGNOSIS — R9431 Abnormal electrocardiogram [ECG] [EKG]: Secondary | ICD-10-CM

## 2021-08-03 DIAGNOSIS — R0789 Other chest pain: Secondary | ICD-10-CM | POA: Insufficient documentation

## 2021-08-03 DIAGNOSIS — Z7984 Long term (current) use of oral hypoglycemic drugs: Secondary | ICD-10-CM | POA: Insufficient documentation

## 2021-08-03 DIAGNOSIS — R778 Other specified abnormalities of plasma proteins: Secondary | ICD-10-CM

## 2021-08-03 DIAGNOSIS — I5033 Acute on chronic diastolic (congestive) heart failure: Secondary | ICD-10-CM | POA: Diagnosis present

## 2021-08-03 DIAGNOSIS — R600 Localized edema: Secondary | ICD-10-CM | POA: Insufficient documentation

## 2021-08-03 DIAGNOSIS — J45909 Unspecified asthma, uncomplicated: Secondary | ICD-10-CM | POA: Insufficient documentation

## 2021-08-03 DIAGNOSIS — I110 Hypertensive heart disease with heart failure: Secondary | ICD-10-CM | POA: Insufficient documentation

## 2021-08-03 DIAGNOSIS — I5043 Acute on chronic combined systolic (congestive) and diastolic (congestive) heart failure: Secondary | ICD-10-CM

## 2021-08-03 DIAGNOSIS — E1165 Type 2 diabetes mellitus with hyperglycemia: Secondary | ICD-10-CM | POA: Insufficient documentation

## 2021-08-03 DIAGNOSIS — Z5329 Procedure and treatment not carried out because of patient's decision for other reasons: Secondary | ICD-10-CM | POA: Insufficient documentation

## 2021-08-03 LAB — BASIC METABOLIC PANEL
Anion gap: 9 (ref 5–15)
BUN: 16 mg/dL (ref 6–20)
CO2: 25 mmol/L (ref 22–32)
Calcium: 8.9 mg/dL (ref 8.9–10.3)
Chloride: 107 mmol/L (ref 98–111)
Creatinine, Ser: 0.84 mg/dL (ref 0.44–1.00)
GFR, Estimated: >60 mL/min (ref >60)
Glucose, Bld: 151 mg/dL — ABNORMAL HIGH (ref 70–99)
Potassium: 4.2 mmol/L (ref 3.5–5.1)
Sodium: 141 mmol/L (ref 135–145)

## 2021-08-03 LAB — BRAIN NATRIURETIC PEPTIDE: B Natriuretic Peptide: 256 pg/mL — ABNORMAL HIGH (ref 0.0–100.0)

## 2021-08-03 LAB — URINALYSIS, ROUTINE W REFLEX MICROSCOPIC
Bilirubin Urine: NEGATIVE
Glucose, UA: NEGATIVE mg/dL
Hgb urine dipstick: NEGATIVE
Ketones, ur: NEGATIVE mg/dL
Leukocytes,Ua: NEGATIVE
Nitrite: NEGATIVE
Protein, ur: NEGATIVE mg/dL
Specific Gravity, Urine: 1.006 (ref 1.005–1.030)
pH: 5.5 (ref 5.0–8.0)

## 2021-08-03 LAB — CBC
HCT: 43.9 % (ref 36.0–46.0)
Hemoglobin: 14.3 g/dL (ref 12.0–15.0)
MCH: 29.2 pg (ref 26.0–34.0)
MCHC: 32.6 g/dL (ref 30.0–36.0)
MCV: 89.6 fL (ref 80.0–100.0)
Platelets: 168 10*3/uL (ref 150–400)
RBC: 4.9 MIL/uL (ref 3.87–5.11)
RDW: 13.1 % (ref 11.5–15.5)
WBC: 7.8 10*3/uL (ref 4.0–10.5)
nRBC: 0 % (ref 0.0–0.2)

## 2021-08-03 LAB — TROPONIN I (HIGH SENSITIVITY)
Troponin I (High Sensitivity): 23 ng/L — ABNORMAL HIGH (ref <18)
Troponin I (High Sensitivity): 20 ng/L — ABNORMAL HIGH (ref <18)

## 2021-08-03 LAB — PREGNANCY, URINE: Preg Test, Ur: NEGATIVE

## 2021-08-03 LAB — CBG MONITORING, ED: Glucose-Capillary: 152 mg/dL — ABNORMAL HIGH (ref 70–99)

## 2021-08-03 MED ORDER — ALBUTEROL SULFATE (2.5 MG/3ML) 0.083% IN NEBU
INHALATION_SOLUTION | RESPIRATORY_TRACT | Status: AC
  Filled 2021-08-03: qty 3

## 2021-08-03 MED ORDER — FUROSEMIDE 20 MG PO TABS
40.0000 mg | ORAL_TABLET | Freq: Every day | ORAL | 0 refills | Status: DC
Start: 2021-08-04 — End: 2021-08-06

## 2021-08-03 MED ORDER — FUROSEMIDE 10 MG/ML IJ SOLN
40.0000 mg | Freq: Once | INTRAMUSCULAR | Status: AC
  Administered 2021-08-03: 40 mg via INTRAVENOUS

## 2021-08-03 MED ORDER — NITROGLYCERIN 0.4 MG SL SUBL
SUBLINGUAL_TABLET | SUBLINGUAL | Status: AC
  Filled 2021-08-03: qty 1

## 2021-08-03 MED ORDER — ASPIRIN 81 MG PO CHEW
324.0000 mg | CHEWABLE_TABLET | Freq: Once | ORAL | Status: AC
  Administered 2021-08-03: 324 mg via ORAL

## 2021-08-03 MED ORDER — IPRATROPIUM-ALBUTEROL 0.5-2.5 (3) MG/3ML IN SOLN
3.0000 mL | Freq: Once | RESPIRATORY_TRACT | Status: AC
  Administered 2021-08-03: 3 mL via RESPIRATORY_TRACT

## 2021-08-03 MED ORDER — IPRATROPIUM-ALBUTEROL 0.5-2.5 (3) MG/3ML IN SOLN
RESPIRATORY_TRACT | Status: AC
  Filled 2021-08-03: qty 3

## 2021-08-03 MED ORDER — FUROSEMIDE 10 MG/ML IJ SOLN
INTRAMUSCULAR | Status: AC
  Filled 2021-08-03: qty 4

## 2021-08-03 MED ORDER — NITROFURANTOIN MONOHYD MACRO 100 MG PO CAPS
ORAL_CAPSULE | ORAL | Status: AC
  Filled 2021-08-03: qty 1

## 2021-08-03 MED ORDER — ACETAMINOPHEN 325 MG PO TABS
ORAL_TABLET | ORAL | Status: AC
  Filled 2021-08-03: qty 2

## 2021-08-03 MED ORDER — ACETAMINOPHEN 325 MG PO TABS
650.0000 mg | ORAL_TABLET | Freq: Once | ORAL | Status: AC
  Administered 2021-08-03: 650 mg via ORAL

## 2021-08-03 MED ORDER — ALBUTEROL SULFATE (2.5 MG/3ML) 0.083% IN NEBU
2.5000 mg | INHALATION_SOLUTION | Freq: Once | RESPIRATORY_TRACT | Status: AC
  Administered 2021-08-03: 2.5 mg via RESPIRATORY_TRACT

## 2021-08-03 MED ORDER — ASPIRIN 81 MG PO CHEW
CHEWABLE_TABLET | ORAL | Status: AC
  Filled 2021-08-03: qty 4

## 2021-08-03 MED ORDER — NITROGLYCERIN 2 % TD OINT
1.0000 [in_us] | TOPICAL_OINTMENT | Freq: Once | TRANSDERMAL | Status: AC
  Administered 2021-08-03: 1 [in_us] via TOPICAL

## 2021-08-03 NOTE — ED Notes (Signed)
Pt anxious, having issues with her work, is leaving AMA ?

## 2021-08-03 NOTE — ED Triage Notes (Signed)
Pt reports that she has SOB that has been going on for several weeks, worse with exertion and swelling in her legs for the past few days and lasix is not working. Pt reports CP for the past month.  ?

## 2021-08-03 NOTE — ED Provider Notes (Addendum)
Blood pressure (!) 162/102, pulse 68, temperature 98.7 ?F (37.1 ?C), temperature source Oral, resp. rate 20, last menstrual period 06/29/2012, SpO2 96 %. ? ?Assuming care from Dr. Dina Rich.  In short, Julia Dunn is a 51 y.o. female with a chief complaint of Shortness of Breath ?Marland Kitchen  Refer to the original H&P for additional details. ? ?The current plan of care is to follow up on remaining labs and response to diuresis. Plan for admit. ? ?Discussed patient's case with [facility], Dr. Olevia Bowens to request admission. Patient and family (if present) updated with plan. ? ?I reviewed all nursing notes, vitals, pertinent old records, EKGs, labs, imaging (as available). ? ?01:08 PM  ?Called to bedside with patient asking to leave.  I sat and spoke with the patient.  She tells me that she has a lot of stressors at home, most of which are financial.  She tells me she is in the hospital she will be missing work and cannot afford to do that.  We had an in-depth discussion about her symptoms and my concern regarding her fluid retention, chest pain, shortness of breath.  She understands my concerns and is able to have a reasonable discussion with me.  She tells me she does not have a regular doctor for follow-up.  I strongly encouraged her to continue with the admission to the hospital but she declines and asked to have her IV removed.  I will place a referral in our system for her to see cardiology if she is able and plan to increase her Lasix over the next few days to try and help her diurese at home.  Patient ultimately leaving [place].  ? ? ?  ?Margette Fast, MD ?08/03/21 [phone] ? ?  ?Margette Fast, MD ?08/03/21 1311 ? ?

## 2021-08-03 NOTE — ED Notes (Signed)
RT educated pt on proper MDI w/spacer admin. Pt states she only uses her spacer occasionally with her inhaler. RT educated her on the importance of using them properly in order to get better deposition of medication to improve her symptoms/symptom relief. Pt has never had a PFT or seen a pulmonologist. RT suggested pt seek an appt with a pulmonologist for PFT for better control of her asthmas symptoms. RT will continue to monitor.  ?

## 2021-08-03 NOTE — Progress Notes (Signed)
Plan of Care Note for accepted transfer ? ? ?Patient: Julia Dunn MRN: [id number]   DOA: 08/03/2021 ? ?Facility requesting transfer: [facility]. ?Requesting Provider: Nanda Quinton, MD ?Reason for transfer: Dyspnea. ?Facility course:  ?51 year old female with a past medical history of chronic diastolic CHF, history of abnormal EKG, type II DM, morbid obesity, hypertension, vitamin D deficiency, asthma, depression, GERD who presented to the emergency department with complaints of progressively worse dyspnea for several weeks associated with on and off chest pain for the past month and lower extremity edema for the past few days.  She feels like her oral furosemide is not working.  She received acetaminophen 650 mg, albuterol 2.5 mg neb x1, a DuoNeb, aspirin 324 mg p.o. x1, furosemide 40 mg IVP and Nitropaste.  Troponin x2 slightly elevated at 23 and 20 ng/L.  BNP 256 pg/mL.  CBC was normal.  BMP with a glucose of 151 mg/dL but otherwise normal.  A 2 view chest radiograph shows cardiomegaly with mild to moderate interstitial edema. ? ?EKG: ?Vent. rate 74 BPM ?PR interval 174 ms ?QRS duration 84 ms ?QT/QTcB 438/486 ms ?P-R-T axes 57 55 62 ?Normal sinus rhythm ?Possible Left atrial enlargement ?Anterolateral infarct , age undetermined ?Abnormal ECG ? ?Plan of care: ?The patient is accepted for admission to Telemetry unit, at [facility]. ? ?Author: ?Reubin Milan, MD ?08/03/2021 ? ?Check [email or web address] for on-call coverage. ? ?Nursing staff, Please call [place] number on Amion as soon as patient's arrival, so appropriate admitting provider can evaluate the pt. ?

## 2021-08-03 NOTE — Discharge Instructions (Signed)
You were seen in the emergency room today with chest pain and shortness of breath.  We had you admitted to the hospital for further testing to see a cardiologist but you are choosing to leave [place].  Please understand that should you change your mind about this decision you are free to return at any time.  If you develop any sudden worsening symptoms he should call 911.  I have placed a referral to the cardiologist in our system to try and help you set up an appointment.  I am also increasing your dose of Lasix over the next 4 days from 20 mg to 40 mg.  Once you complete this please go back on your 20 mg dose until you see the cardiologist.  ?

## 2021-08-03 NOTE — ED Provider Notes (Signed)
?[facility] ?Provider Note ? ? ?CSN: [id number] ?Arrival date & time: 08/03/21  0524 ? ?  ? ?History ? ?Chief Complaint  ?Patient presents with  ? Shortness of Breath  ? ? ?Julia Dunn is a 51 y.o. female. ? ?HPI ? ?  ? ?This is a 51 year old female with a history of hypertension and diabetes who presents with shortness of breath and chest pain.  Patient reports that over the last months she has had increasing shortness of breath and chest discomfort at work.  She states she cannot go out very many steps and anytime she exerts herself she gets very winded.  She denies any fevers or cough.  She is a non-smoker.  She has had some lower extremity swelling.  She describes that chest pain as both sharp and pressure.  She states she is currently having some pain.  Upon arrival, she was evaluated by respiratory therapy and noted to be wheezing.  She was given a neb.  She states that that did help her some. ? ?Chart reviewed.  Echocardiogram from 8/21 with very mild systolic heart failure with an EF of 50 to 55%.  She also has grade 1 diastolic heart failure.  Coronary CT from that same time with a calcium score of 0. ? ?Home Medications ?Prior to Admission medications   ?Medication Sig Start Date End Date Taking? Authorizing Provider  ?albuterol (VENTOLIN HFA) 108 (90 Base) MCG/ACT inhaler Inhale 2 puffs into the lungs every 6 (six) hours as needed for wheezing or shortness of breath. 10/20/19   Argentina Donovan, PA-C  ?albuterol (VENTOLIN HFA) 108 (90 Base) MCG/ACT inhaler Inhale 1-2 puffs into the lungs every 6 (six) hours as needed for wheezing or shortness of breath. 10/20/19   Argentina Donovan, PA-C  ?amLODipine (NORVASC) 5 MG tablet TAKE 1 TABLET (5 MG TOTAL) BY MOUTH DAILY. 12/07/20 12/07/21  Ladell Pier, MD  ?atorvastatin (LIPITOR) 20 MG tablet Take 1 tablet (20 mg total) by mouth daily after supper. 12/07/20   Ladell Pier, MD  ?diclofenac Sodium (VOLTAREN) 1 % GEL Apply 4 g  topically 4 (four) times daily as needed. 03/14/19   Hilts, Legrand Como, MD  ?FLUoxetine (PROZAC) 10 MG capsule Take 1 capsule (10 mg total) by mouth daily. 12/07/20   Ladell Pier, MD  ?furosemide (LASIX) 20 MG tablet Take 1 tablet (20 mg total) by mouth daily. 12/07/20   Ladell Pier, MD  ?insulin glargine (LANTUS) 100 UNIT/ML Solostar Pen INJECT 14 UNITS INTO THE SKIN DAILY. 12/07/20 12/07/21  Ladell Pier, MD  ?Insulin Pen Needle (TRUEPLUS PEN NEEDLES) 32G X 4 MM MISC Use to inject insulin daily. 04/21/19   Fulp, Cammie, MD  ?lisinopril-hydrochlorothiazide (ZESTORETIC) 20-12.5 MG tablet Take 2 tablets by mouth daily. 12/07/20   Ladell Pier, MD  ?metFORMIN (GLUCOPHAGE-XR) 500 MG 24 hr tablet Take 1 tablet (500 mg total) by mouth 2 (two) times daily. 12/07/20   Ladell Pier, MD  ?methocarbamol (ROBAXIN) 750 MG tablet Take 1 tablet (750 mg total) by mouth 3 (three) times daily as needed (muscle spasm/pain). 06/26/21   Lajean Saver, MD  ?naproxen (NAPROSYN) 500 MG tablet Take 1 tablet (500 mg total) by mouth 2 (two) times daily with a meal. As needed for pain/headache; take after eating 04/21/19   Fulp, Cammie, MD  ?nitroGLYCERIN (NITRO-DUR) 0.1 mg/hr patch Apply 1/4 patch to heel area 12 hours daily 03/14/19   Hilts, Michael, MD  ?omeprazole (PRILOSEC) 20 MG  capsule TAKE 1 CAPSULE (20 MG TOTAL) BY MOUTH DAILY. 01/02/21 01/02/22  Ladell Pier, MD  ?ONE TOUCH ULTRA TEST test strip USE  STRIP TO CHECK GLUCOSE THREE TIMES DAILY BEFORE MEAL(S) 10/23/16   Charlott Rakes, MD  ?PRESCRIPTION MEDICATION Take 0.5 tablets by mouth See admin instructions. 1/2 tablet of Bumetanide.    [provider]  ?valACYclovir (VALTREX) 1000 MG tablet Take 1 tablet (1,000 mg total) by mouth 2 (two) times daily. 10/25/19   Gildardo Pounds, NP  ?   ? ?Allergies    ?Other   ? ?Review of Systems   ?Review of Systems  ?Constitutional:  Negative for fever.  ?Respiratory:  Positive for shortness of breath. Negative for  cough.   ?Cardiovascular:  Positive for chest pain and leg swelling.  ?All other systems reviewed and are negative. ? ?Physical Exam ?Updated Vital Signs ?BP (!) 162/102   Pulse 68   Temp 98.7 ?F (37.1 ?C) (Oral)   Resp 20   LMP 06/29/2012   SpO2 96%  ?Physical Exam ?Vitals and nursing note reviewed.  ?Constitutional:   ?   Appearance: She is well-developed.  ?HENT:  ?   Head: Normocephalic and atraumatic.  ?Eyes:  ?   Pupils: Pupils are equal, round, and reactive to light.  ?Cardiovascular:  ?   Rate and Rhythm: Normal rate and regular rhythm.  ?   Heart sounds: Normal heart sounds.  ?Pulmonary:  ?   Effort: Pulmonary effort is normal. No respiratory distress.  ?   Breath sounds: Wheezing present.  ?Abdominal:  ?   General: Bowel sounds are normal.  ?   Palpations: Abdomen is soft.  ?Musculoskeletal:  ?   Cervical back: Neck supple.  ?   Right lower leg: Edema present.  ?   Left lower leg: Edema present.  ?   Comments: Trace to 1+ symmetric bilateral lower extremity edema  ?Skin: ?   General: Skin is warm and dry.  ?Neurological:  ?   Mental Status: She is alert and oriented to person, place, and time.  ?Psychiatric:     ?   Mood and Affect: Mood normal.  ? ? ?ED Results / Procedures / Treatments   ?Labs ?(all labs ordered are listed, but only abnormal results are displayed) ?Labs Reviewed  ?BASIC METABOLIC PANEL - Abnormal; Notable for the following components:  ?    Result Value  ? Glucose, Bld 151 (*)   ? All other components within normal limits  ?TROPONIN I (HIGH SENSITIVITY) - Abnormal; Notable for the following components:  ? Troponin I (High Sensitivity) 23 (*)   ? All other components within normal limits  ?CBC  ?PREGNANCY, URINE  ? ? ?EKG ?EKG Interpretation ? ?Date/Time:  Saturday Aug 03 2021 05:46:23 EDT ?Ventricular Rate:  74 ?PR Interval:  174 ?QRS Duration: 84 ?QT Interval:  438 ?QTC Calculation: 486 ?R Axis:   55 ?Text Interpretation: Normal sinus rhythm Possible Left atrial enlargement  Anterolateral infarct , age undetermined Abnormal ECG When compared with ECG of 26-Jun-2021 18:07, PREVIOUS ECG IS PRESENT Confirmed by Thayer Jew [phone]) on 08/03/2021 6:00:47 AM ? ?Radiology ?DG Chest 2 View ? ?Result Date: 08/03/2021 ?CLINICAL DATA:  Chest pain and shortness of breath. EXAM: CHEST - 2 VIEW COMPARISON:  PA chest and rib series 06/26/2021. FINDINGS: There is mild-to-moderate cardiomegaly with interval development of central vascular distension, flow cephalization, and Kerley's B and C lines in the central lungs and lateral bases consistent with interstitial edema.  Small pleural effusions are beginning to form as well. There perihilar opacities on the right-greater-than-left which are most likely due to alveolar edema. Underlying pneumonia is possible in the proper clinical setting. The peripheral lungs are clear. Thoracic cage is intact with mild reverse S shaped thoracic scoliosis. IMPRESSION: 1. Cardiomegaly with mild-to-moderate interstitial edema consistent with CHF or fluid overload. 2. Right-greater-than-left perihilar opacities which are probably alveolar edema with asymmetry, less likely pneumonia. 3. Clinical correlation and radiographic follow-up recommended. Electronically Signed   By: Telford Nab M.D.   On: 08/03/2021 06:28   ? ?Procedures ?Marland KitchenCritical Care ?Performed by: Merryl Hacker, MD ?Authorized by: Merryl Hacker, MD  ? ?Critical care provider statement:  ?  Critical care time (minutes):  30 ?  Critical care was necessary to treat or prevent imminent or life-threatening deterioration of the following conditions:  Cardiac failure ?  Critical care was time spent personally by me on the following activities:  Development of treatment plan with patient or surrogate, discussions with consultants, evaluation of patient's response to treatment, examination of patient, ordering and review of laboratory studies, ordering and review of radiographic studies, ordering and  performing treatments and interventions, pulse oximetry, re-evaluation of patient's condition and review of old charts  ? ? ?Medications Ordered in ED ?Medications  ?ipratropium-albuterol (DUONEB) 0.5-2.5 (3) MG/3ML nebulizer

## 2021-08-03 NOTE — ED Notes (Signed)
Transported to xray 

## 2021-08-06 ENCOUNTER — Ambulatory Visit (INDEPENDENT_AMBULATORY_CARE_PROVIDER_SITE_OTHER): Payer: Self-pay | Admitting: Physician Assistant

## 2021-08-06 ENCOUNTER — Other Ambulatory Visit: Payer: Self-pay

## 2021-08-06 ENCOUNTER — Encounter: Payer: Self-pay | Admitting: Physician Assistant

## 2021-08-06 VITALS — BP 162/94 | HR 70 | Ht 65.0 in | Wt 222.0 lb

## 2021-08-06 DIAGNOSIS — E119 Type 2 diabetes mellitus without complications: Secondary | ICD-10-CM

## 2021-08-06 DIAGNOSIS — Z794 Long term (current) use of insulin: Secondary | ICD-10-CM

## 2021-08-06 DIAGNOSIS — I10 Essential (primary) hypertension: Secondary | ICD-10-CM

## 2021-08-06 DIAGNOSIS — I5033 Acute on chronic diastolic (congestive) heart failure: Secondary | ICD-10-CM

## 2021-08-06 MED ORDER — FUROSEMIDE 20 MG PO TABS
20.0000 mg | ORAL_TABLET | Freq: Every day | ORAL | 3 refills | Status: DC
Start: 2021-08-06 — End: 2022-04-08
  Filled 2021-08-06: qty 90, 90d supply, fill #0
  Filled 2022-02-25: qty 90, 90d supply, fill #1

## 2021-08-06 NOTE — Patient Instructions (Signed)
Medication Instructions:  ?Increase Lasix to 40 mg daily for 3 days, then decrease to 20 mg daily  ? ?*If you need a refill on your cardiac medications before your next appointment, please call your pharmacy* ? ? ?Lab Work: ?None ordered  ? ?If you have labs (blood work) drawn today and your tests are completely normal, you will receive your results only by: ?MyChart Message (if you have MyChart) OR ?A paper copy in the mail ?If you have any lab test that is abnormal or we need to change your treatment, we will call you to review the results. ? ? ?Testing/Procedures: ?None ordered  ? ? ?Follow-Up: ?Follow up as scheduled  ? ? ?Other Instructions ?None  ? ?Important Information About Sugar ? ? ? ? ?  ?

## 2021-08-06 NOTE — Progress Notes (Signed)
?Cardiology Office Note:   ? ?Date:  08/06/2021  ? ?ID:  Julia Dunn, DOB Jun 10, 1970, MRN [id number] ? ?PCP:  Ladell Pier, MD  ?[facility] Cardiologist:  Lauree Chandler, MD  ?[facility] Electrophysiologist:  None  ? ?Chief Complaint: SOB >> ER follow up  ? ?History of Present Illness:   ? ?Julia Dunn is a 51 y.o. female with a hx of asthma, depression, diabetes mellitus, GERD, chronic diastolic CHF,  morbid obesity and HTN here for ER follow up.  ? ?Echo 11/24/19 with LVEF=50-55%. Moderate LVH. No significant valve disease. Coronary CTA 11/29/19 with no evidence of CAD. Calcium score zero.  ? ?Seen in the emergency room 08/03/2021 for evaluation of shortness of breath, lower extremity edema and chest pain.  She was noted hypertensive at 162/102.  EKG without acute ischemic changes.  Initial troponin 23>>20.  BNP 256. She received acetaminophen 650 mg, albuterol 2.5 mg neb x1, a DuoNeb, aspirin 324 mg p.o. x1, furosemide 40 mg IVP and Nitropaste.  Plan was for admission but patient recommended discharge from ED.  She left AMA. ? ?Here for follow up.  Patient reported few months history of shortness of breath, lower extremity edema, orthopnea, PND and chest pain.  This has been intermittent.  She only takes her medication once or twice per week.  No specific reason.  She seems depressed.  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  Despite recommended increasing Lasix after ER visits she did not follow the instruction.  Not taking her medication regularly.  Denies palpitation, syncope or melena. ? ?Past Medical History:  ?Diagnosis Date  ? Anemia Dx 2013  ? Asthma   ? Bacterial vaginosis 2008  ? Complication of anesthesia   ? pt states was informed had difficult time awakening following hysterectomy   ? Depression   ? Depression   ? Diabetes mellitus Dx 2005  ? Fibroids 09/05/2011  ? 08/08/2011: Anteverted uterus. Diffuse fibroid involvement. Left of midline fibroid with submucosal  component. 3.4cm x 2.8cmx 3.4cm.   ? GERD (gastroesophageal reflux disease)   ? Gestational diabetes   ? H/O shortness of breath   ? pt states has improved since lossing weight; triggers currently are enviromental pt states can make it to top of stairs w/o having to stop to catch her breath  ? Headache(784.0)   ? migraines  ? History of blood transfusion 2013/2014  ? History of bronchitis   ? Hx of blood clots   ? Hypertension Dx 2015  ? Kidney infection 2015  ? Menorrhagia 09/27/2010  ? Pneumonia   ? Postpartum depression   ? Pregnancy induced hypertension   ? Trichomonas   ? ? ?Past Surgical History:  ?Procedure Laterality Date  ? ABDOMINAL HYSTERECTOMY N/A 11/03/2012  ? Procedure: HYSTERECTOMY ABDOMINAL;  Surgeon: Ena Dawley, MD;  Location: [facility];  Service: Gynecology;  Laterality: N/A;  ? BILATERAL SALPINGECTOMY Bilateral 11/03/2012  ? Procedure: BILATERAL SALPINGECTOMY;  Surgeon: Ena Dawley, MD;  Location: [facility];  Service: Gynecology;  Laterality: Bilateral;  ? BURCH PROCEDURE N/A 11/03/2012  ? Procedure: BURCH PROCEDURE;  Surgeon: Ena Dawley, MD;  Location: [facility];  Service: Gynecology;  Laterality: N/A;  ? CYSTOSCOPY N/A 11/03/2012  ? Procedure: CYSTOSCOPY;  Surgeon: Ena Dawley, MD;  Location: [facility];  Service: Gynecology;  Laterality: N/A;  ? DILATION AND CURETTAGE OF UTERUS    ? LAPAROSCOPY N/A 11/03/2012  ? Procedure: LAPAROSCOPY DIAGNOSTIC;  Surgeon: Ena Dawley, MD;  Location[facility];  Service: Gynecology;  Laterality: N/A;  ? ORIF ANKLE FRACTURE Left 01/04/2016  ? Procedure: OPEN REDUCTION INTERNAL FIXATION (ORIF) LEFT BIMALLEOLAR ANKLE FRACTURE;  Surgeon: Mcarthur Rossetti, MD;  Location: [facility];  Service: Orthopedics;  Laterality: Left;  ? TUBAL LIGATION    ? WISDOM TOOTH EXTRACTION    ? 3 teeth  ? ? ?Current Medications: ?Current Meds  ?Medication Sig  ? albuterol (VENTOLIN HFA) 108 (90 Base) MCG/ACT inhaler Inhale 2 puffs into the lungs every 6 (six) hours as needed for wheezing  or shortness of breath.  ? albuterol (VENTOLIN HFA) 108 (90 Base) MCG/ACT inhaler Inhale 1-2 puffs into the lungs every 6 (six) hours as needed for wheezing or shortness of breath.  ? amLODipine (NORVASC) 5 MG tablet TAKE 1 TABLET (5 MG TOTAL) BY MOUTH DAILY.  ? atorvastatin (LIPITOR) 20 MG tablet Take 1 tablet (20 mg total) by mouth daily after supper.  ? diclofenac Sodium (VOLTAREN) 1 % GEL Apply 4 g topically 4 (four) times daily as needed.  ? FLUoxetine (PROZAC) 10 MG capsule Take 1 capsule (10 mg total) by mouth daily.  ? furosemide (LASIX) 20 MG tablet Take 1 tablet (20 mg total) by mouth daily.  ? insulin glargine (LANTUS) 100 UNIT/ML Solostar Pen INJECT 14 UNITS INTO THE SKIN DAILY.  ? Insulin Pen Needle (TRUEPLUS PEN NEEDLES) 32G X 4 MM MISC Use to inject insulin daily.  ? lisinopril-hydrochlorothiazide (ZESTORETIC) 20-12.5 MG tablet Take 2 tablets by mouth daily.  ? metFORMIN (GLUCOPHAGE-XR) 500 MG 24 hr tablet Take 1 tablet (500 mg total) by mouth 2 (two) times daily.  ? methocarbamol (ROBAXIN) 750 MG tablet Take 1 tablet (750 mg total) by mouth 3 (three) times daily as needed (muscle spasm/pain).  ? naproxen (NAPROSYN) 500 MG tablet Take 1 tablet (500 mg total) by mouth 2 (two) times daily with a meal. As needed for pain/headache; take after eating  ? nitroGLYCERIN (NITRO-DUR) 0.1 mg/hr patch Apply 1/4 patch to heel area 12 hours daily  ? omeprazole (PRILOSEC) 20 MG capsule TAKE 1 CAPSULE (20 MG TOTAL) BY MOUTH DAILY.  ? ONE TOUCH ULTRA TEST test strip USE  STRIP TO CHECK GLUCOSE THREE TIMES DAILY BEFORE MEAL(S)  ? PRESCRIPTION MEDICATION Take 0.5 tablets by mouth See admin instructions. 1/2 tablet of Bumetanide.  ? valACYclovir (VALTREX) 1000 MG tablet Take 1 tablet (1,000 mg total) by mouth 2 (two) times daily.  ? [DISCONTINUED] furosemide (LASIX) 20 MG tablet Take 2 tablets (40 mg total) by mouth daily for 4 days.  ?  ? ?Allergies:   Other  ? ?Social History  ? ?Socioeconomic History  ? Marital  status: Single  ?  Spouse name: Not on file  ? Number of children: 3  ? Years of education: Not on file  ? Highest education level: Not on file  ?Occupational History  ? Occupation: Unemployed  ?Tobacco Use  ? Smoking status: Never  ? Smokeless tobacco: Never  ?Substance and Sexual Activity  ? Alcohol use: Yes  ?  Comment: rarely  ? Drug use: No  ? Sexual activity: Yes  ?  Birth control/protection: Surgical  ?  Comment: hysterectomy  ?Other Topics Concern  ? Not on file  ?Social History Narrative  ? Not on file  ? ?Social Determinants of Health  ? ?Financial Resource Strain: Not on file  ?Food Insecurity: Not on file  ?Transportation Needs: Not on file  ?Physical Activity: Not on file  ?Stress: Not on file  ?  Social Connections: Not on file  ?  ? ?Family History: ?The patient's family history includes CAD in her mother; Heart disease in her mother and paternal aunt; Hypertension in her brother, father, and mother; Rheumatic fever in her mother; Sickle cell trait in her daughter; Stroke in her brother and mother.   ? ?ROS:   ?Please see the history of present illness.    ?All other systems reviewed and are negative.  ? ?EKGs/Labs/Other Studies Reviewed:   ? ?The following studies were reviewed today: ? ?Coronary CT 10/2019 ?IMPRESSION: ?1. Coronary artery calcium score 0 Agatston units. This suggests low ?risk for future cardiac events. ?  ?2.  No significant coronary disease was seen. ?  ?Loralie Champagne ?  ? ?Echo 11/24/19 ?1. Left ventricular ejection fraction, by estimation, is 50 to 55%. The  ?left ventricle has low normal function. The left ventricle has no regional  ?wall motion abnormalities. There is moderate asymmetric left ventricular  ?hypertrophy of the posterior  ?segment. Left ventricular diastolic parameters are consistent with Grade I  ?diastolic dysfunction (impaired relaxation). Elevated left ventricular  ?end-diastolic pressure. The average left ventricular global longitudinal  ?strain is -16.9 %. The  global  ?longitudinal strain is normal.  ? 2. Right ventricular systolic function is normal. The right ventricular  ?size is normal.  ? 3. The mitral valve is normal in structure. No evidence of mitral valve  ?

## 2021-08-07 ENCOUNTER — Other Ambulatory Visit: Payer: Self-pay

## 2021-08-08 ENCOUNTER — Other Ambulatory Visit: Payer: Self-pay

## 2021-08-09 ENCOUNTER — Ambulatory Visit (INDEPENDENT_AMBULATORY_CARE_PROVIDER_SITE_OTHER): Payer: Self-pay | Admitting: Orthopaedic Surgery

## 2021-08-09 ENCOUNTER — Ambulatory Visit (INDEPENDENT_AMBULATORY_CARE_PROVIDER_SITE_OTHER): Payer: Self-pay

## 2021-08-09 ENCOUNTER — Ambulatory Visit: Payer: Self-pay

## 2021-08-09 DIAGNOSIS — M79642 Pain in left hand: Secondary | ICD-10-CM

## 2021-08-09 DIAGNOSIS — G5602 Carpal tunnel syndrome, left upper limb: Secondary | ICD-10-CM | POA: Insufficient documentation

## 2021-08-09 DIAGNOSIS — G5601 Carpal tunnel syndrome, right upper limb: Secondary | ICD-10-CM | POA: Insufficient documentation

## 2021-08-09 DIAGNOSIS — M79641 Pain in right hand: Secondary | ICD-10-CM

## 2021-08-09 NOTE — Progress Notes (Signed)
? ?Office Visit Note ?  ?Patient: Julia Dunn           ?Date of Birth: 26-Feb-1971           ?MRN: [id number] ?Visit Date: 08/09/2021 ?             ?Requested by: Ladell Pier, MD ?[address] ?[address] ?[place],  [place] [address] ?PCP: Ladell Pier, MD ? ? ?Assessment & Plan: ?Visit Diagnoses:  ?1. Bilateral hand pain   ?2. Right carpal tunnel syndrome   ?3. Left carpal tunnel syndrome   ? ? ?Plan: Impression is bilateral severe carpal tunnel syndrome and I think she also has a component of osteoarthritis.  She understands that the osteoarthritis is treated medically.  She is interested in carpal tunnel surgery but we will need to make sure her A1c is acceptable.  I counseled her at length about being medically compliant.  We will be in touch with her regarding the A1c results. ? ?Follow-Up Instructions: No follow-ups on file.  ? ?Orders:  ?Orders Placed This Encounter  ?Procedures  ? XR Hand Complete Right  ? XR Hand Complete Left  ? Hemoglobin A1C  ? ?No orders of the defined types were placed in this encounter. ? ? ? ? Procedures: ?No procedures performed ? ? ?Clinical Data: ?No additional findings. ? ? ?Subjective: ?Chief Complaint  ?Patient presents with  ? Right Hand - Pain  ? ? ?HPI ? ?Julia Dunn is a 51 year old female who comes in for evaluation of bilateral hand pain and numbness and tingling.  We last saw her in January 2021 and she had nerve conduction studies that showed severe bilateral carpal tunnel syndrome.  She was not a surgical candidate at that time as her A1c was double digits.  Unfortunately she has been lost to follow-up until today.  She continues to experience severe carpal tunnel symptoms.  She cleans houses for living.  She was recently in the ED for CHF exacerbation and uncontrolled hypertension. ? ?Review of Systems  ?Constitutional: Negative.   ?HENT: Negative.    ?Eyes: Negative.   ?Respiratory: Negative.    ?Cardiovascular: Negative.   ?Endocrine: Negative.    ?Musculoskeletal: Negative.   ?Neurological: Negative.   ?Hematological: Negative.   ?Psychiatric/Behavioral: Negative.    ?All other systems reviewed and are negative. ? ? ?Objective: ?Vital Signs: LMP 06/29/2012  ? ?Physical Exam ?Vitals and nursing note reviewed.  ?Constitutional:   ?   Appearance: She is well-developed.  ?Pulmonary:  ?   Effort: Pulmonary effort is normal.  ?Skin: ?   General: Skin is warm.  ?   Capillary Refill: Capillary refill takes less than 2 seconds.  ?Neurological:  ?   Mental Status: She is alert and oriented to person, place, and time.  ?Psychiatric:     ?   Behavior: Behavior normal.     ?   Thought Content: Thought content normal.     ?   Judgment: Judgment normal.  ? ? ?Ortho Exam ? ?Examination of bilateral hands show no significant swelling.  Slight tenderness throughout the IP joints.  Positive carpal tunnel compressive signs.  Brisk capillary refill. ? ?Specialty Comments:  ?No specialty comments available. ? ?Imaging: ?XR Hand Complete Left ? ?Result Date: 08/09/2021 ?No acute or structural abnormalities ? ?XR Hand Complete Right ? ?Result Date: 08/09/2021 ?No acute or structural abnormalities.  ? ? ?PMFS History: ?Patient Active Problem List  ? Diagnosis Date Noted  ? Bilateral hand pain 08/09/2021  ?  Right carpal tunnel syndrome 08/09/2021  ? Left carpal tunnel syndrome 08/09/2021  ? Acute on chronic diastolic congestive heart failure ([place]) 08/03/2021  ? Closed displaced bimalleolar fracture of left ankle 01/04/2016  ? Abnormal EKG 06/22/2014  ? Abnormal CT scan of lung 06/22/2014  ? Dyspnea 06/19/2014  ? Microscopic hematuria 04/07/2014  ? Onychomycosis 05/31/2013  ? BI-RADS category 3 mammogram result 03/07/2013  ? Vitamin D insufficiency 08/13/2011  ? Morbid obesity ([place]) 08/10/2007  ? ALLERGIC RHINITIS, SEASONAL 07/14/2006  ? DM2 (diabetes mellitus, type 2) 05/28/2006  ? DEPRESSIVE DISORDER, NOS 05/28/2006  ? HTN (hypertension) 05/28/2006  ? ?Past Medical History:   ?Diagnosis Date  ? Anemia Dx 2013  ? Asthma   ? Bacterial vaginosis 2008  ? Complication of anesthesia   ? pt states was informed had difficult time awakening following hysterectomy   ? Depression   ? Depression   ? Diabetes mellitus Dx 2005  ? Fibroids 09/05/2011  ? 08/08/2011: Anteverted uterus. Diffuse fibroid involvement. Left of midline fibroid with submucosal component. 3.4cm x 2.8cmx 3.4cm.   ? GERD (gastroesophageal reflux disease)   ? Gestational diabetes   ? H/O shortness of breath   ? pt states has improved since lossing weight; triggers currently are enviromental pt states can make it to top of stairs w/o having to stop to catch her breath  ? Headache(784.0)   ? migraines  ? History of blood transfusion 2013/2014  ? History of bronchitis   ? Hx of blood clots   ? Hypertension Dx 2015  ? Kidney infection 2015  ? Menorrhagia 09/27/2010  ? Pneumonia   ? Postpartum depression   ? Pregnancy induced hypertension   ? Trichomonas   ?  ?Family History  ?Problem Relation Age of Onset  ? Hypertension Brother   ? Stroke Brother   ? Sickle cell trait Daughter   ? Hypertension Father   ? Heart disease Mother   ? Rheumatic fever Mother   ? Hypertension Mother   ? Stroke Mother   ? CAD Mother   ? Heart disease Paternal Aunt   ?  ?Past Surgical History:  ?Procedure Laterality Date  ? ABDOMINAL HYSTERECTOMY N/A 11/03/2012  ? Procedure: HYSTERECTOMY ABDOMINAL;  Surgeon: Ena Dawley, MD;  Location: [facility];  Service: Gynecology;  Laterality: N/A;  ? BILATERAL SALPINGECTOMY Bilateral 11/03/2012  ? Procedure: BILATERAL SALPINGECTOMY;  Surgeon: Ena Dawley, MD;  Location: [facility];  Service: Gynecology;  Laterality: Bilateral;  ? BURCH PROCEDURE N/A 11/03/2012  ? Procedure: BURCH PROCEDURE;  Surgeon: Ena Dawley, MD;  Location: [facility];  Service: Gynecology;  Laterality: N/A;  ? CYSTOSCOPY N/A 11/03/2012  ? Procedure: CYSTOSCOPY;  Surgeon: Ena Dawley, MD;  Location: [facility];  Service: Gynecology;  Laterality: N/A;  ? DILATION  AND CURETTAGE OF UTERUS    ? LAPAROSCOPY N/A 11/03/2012  ? Procedure: LAPAROSCOPY DIAGNOSTIC;  Surgeon: Ena Dawley, MD;  Location: [facility];  Service: Gynecology;  Laterality: N/A;  ? ORIF ANKLE FRACTURE Left 01/04/2016  ? Procedure: OPEN REDUCTION INTERNAL FIXATION (ORIF) LEFT BIMALLEOLAR ANKLE FRACTURE;  Surgeon: Mcarthur Rossetti, MD;  Location: [facility];  Service: Orthopedics;  Laterality: Left;  ? TUBAL LIGATION    ? WISDOM TOOTH EXTRACTION    ? 3 teeth  ? ?Social History  ? ?Occupational History  ? Occupation: Unemployed  ?Tobacco Use  ? Smoking status: Never  ? Smokeless tobacco: Never  ?Substance and Sexual Activity  ? Alcohol use: Yes  ?  Comment: rarely  ?  Drug use: No  ? Sexual activity: Yes  ?  Birth control/protection: Surgical  ?  Comment: hysterectomy  ? ? ? ? ? ? ?

## 2021-08-10 LAB — HEMOGLOBIN A1C
Hgb A1c MFr Bld: 7.4 % of total Hgb — ABNORMAL HIGH (ref <5.7)
Mean Plasma Glucose: 166 mg/dL
eAG (mmol/L): 9.2 mmol/L

## 2021-08-10 LAB — EXTRA SPECIMEN

## 2021-08-13 ENCOUNTER — Other Ambulatory Visit: Payer: Self-pay

## 2021-08-29 ENCOUNTER — Ambulatory Visit: Payer: Self-pay | Admitting: Physician Assistant

## 2021-09-13 ENCOUNTER — Encounter (HOSPITAL_COMMUNITY): Payer: Self-pay | Admitting: Emergency Medicine

## 2021-09-13 ENCOUNTER — Ambulatory Visit (HOSPITAL_COMMUNITY)
Admission: EM | Admit: 2021-09-13 | Discharge: 2021-09-13 | Disposition: A | Discharge status: Home / Self Care | Payer: Self-pay | Attending: Internal Medicine | Admitting: Internal Medicine

## 2021-09-13 DIAGNOSIS — H1033 Unspecified acute conjunctivitis, bilateral: Secondary | ICD-10-CM

## 2021-09-13 DIAGNOSIS — J029 Acute pharyngitis, unspecified: Secondary | ICD-10-CM

## 2021-09-13 LAB — POCT RAPID STREP A, ED / UC: Streptococcus, Group A Screen (Direct): NEGATIVE

## 2021-09-13 MED ORDER — TOBRAMYCIN 0.3 % OP SOLN: 1.0000 [drp] | OPHTHALMIC | 0 refills | Status: AC

## 2021-09-13 NOTE — ED Triage Notes (Signed)
Pt c/o sore throat since Wed. Bilat eye drainage since yesterday.

## 2021-09-13 NOTE — Discharge Instructions (Addendum)
Warm compresses.

## 2021-09-13 NOTE — ED Provider Notes (Signed)
[place]    CSN: [id number] Arrival date & time: 09/13/21  1943      History   Chief Complaint Chief Complaint  Patient presents with   Sore Throat   Conjunctivitis    HPI Julia Dunn is a 51 y.o. female.   Patient complains of tearing and drainage from both eyes patient reports her boyfriend was recently diagnosed with pinkeye.  Patient is also concerned that she could have strep as she has a sore throat.  Patient denies any fever or chills she has not had any cough or congestion  The history is provided by the patient. No language interpreter was used.  Sore Throat This is a new problem. The problem occurs constantly. Nothing aggravates the symptoms. Nothing relieves the symptoms. She has tried nothing for the symptoms.  Conjunctivitis    Past Medical History:  Diagnosis Date   Anemia Dx 2013   Asthma    Bacterial vaginosis 8938   Complication of anesthesia    pt states was informed had difficult time awakening following hysterectomy    Depression    Depression    Diabetes mellitus Dx 2005   Fibroids 09/05/2011   08/08/2011: Anteverted uterus. Diffuse fibroid involvement. Left of midline fibroid with submucosal component. 3.4cm x 2.8cmx 3.4cm.    GERD (gastroesophageal reflux disease)    Gestational diabetes    H/O shortness of breath    pt states has improved since lossing weight; triggers currently are enviromental pt states can make it to top of stairs w/o having to stop to catch her breath   Headache(784.0)    migraines   History of blood transfusion 2013/2014   History of bronchitis    Hx of blood clots    Hypertension Dx 2015   Kidney infection 2015   Menorrhagia 09/27/2010   Pneumonia    Postpartum depression    Pregnancy induced hypertension    Trichomonas     Patient Active Problem List   Diagnosis Date Noted   Bilateral hand pain 08/09/2021   Right carpal tunnel syndrome 08/09/2021   Left carpal tunnel syndrome 08/09/2021   Acute  on chronic diastolic congestive heart failure ([place]) 08/03/2021   Closed displaced bimalleolar fracture of left ankle 01/04/2016   Abnormal EKG 06/22/2014   Abnormal CT scan of lung 06/22/2014   Dyspnea 06/19/2014   Microscopic hematuria 04/07/2014   Onychomycosis 05/31/2013   BI-RADS category 3 mammogram result 03/07/2013   Vitamin D insufficiency 08/13/2011   Morbid obesity ([place]) 08/10/2007   ALLERGIC RHINITIS, SEASONAL 07/14/2006   DM2 (diabetes mellitus, type 2) 05/28/2006   DEPRESSIVE DISORDER, NOS 05/28/2006   HTN (hypertension) 05/28/2006    Past Surgical History:  Procedure Laterality Date   ABDOMINAL HYSTERECTOMY N/A 11/03/2012   Procedure: HYSTERECTOMY ABDOMINAL;  Surgeon: Ena Dawley, MD;  Location: [facility];  Service: Gynecology;  Laterality: N/A;   BILATERAL SALPINGECTOMY Bilateral 11/03/2012   Procedure: BILATERAL SALPINGECTOMY;  Surgeon: Ena Dawley, MD;  Location: [facility];  Service: Gynecology;  Laterality: Bilateral;   BURCH PROCEDURE N/A 11/03/2012   Procedure: BURCH PROCEDURE;  Surgeon: Ena Dawley, MD;  Location: [facility];  Service: Gynecology;  Laterality: N/A;   CYSTOSCOPY N/A 11/03/2012   Procedure: CYSTOSCOPY;  Surgeon: Ena Dawley, MD;  Location: [facility];  Service: Gynecology;  Laterality: N/A;   DILATION AND CURETTAGE OF UTERUS     LAPAROSCOPY N/A 11/03/2012   Procedure: LAPAROSCOPY DIAGNOSTIC;  Surgeon: Ena Dawley, MD;  Location: [facility];  Service: Gynecology;  Laterality: N/A;   ORIF ANKLE FRACTURE Left 01/04/2016   Procedure: OPEN REDUCTION INTERNAL FIXATION (ORIF) LEFT BIMALLEOLAR ANKLE FRACTURE;  Surgeon: Mcarthur Rossetti, MD;  Location: [facility];  Service: Orthopedics;  Laterality: Left;   TUBAL LIGATION     WISDOM TOOTH EXTRACTION     3 teeth    OB History     Gravida  6   Para  3   Term  3   Preterm      AB  3   Living  3      SAB      IAB  3   Ectopic      Multiple      Live Births  3            Home  Medications    Prior to Admission medications   Medication Sig Start Date End Date Taking? Authorizing Provider  tobramycin (TOBREX) 0.3 % ophthalmic solution Place 1 drop into the right eye every 4 (four) hours for 10 days. 09/13/21 09/23/21 Yes Fransico Meadow, PA-C  albuterol (VENTOLIN HFA) 108 (90 Base) MCG/ACT inhaler Inhale 2 puffs into the lungs every 6 (six) hours as needed for wheezing or shortness of breath. 10/20/19   Argentina Donovan, PA-C  albuterol (VENTOLIN HFA) 108 (90 Base) MCG/ACT inhaler Inhale 1-2 puffs into the lungs every 6 (six) hours as needed for wheezing or shortness of breath. 10/20/19   Argentina Donovan, PA-C  amLODipine (NORVASC) 5 MG tablet TAKE 1 TABLET (5 MG TOTAL) BY MOUTH DAILY. 12/07/20 12/07/21  Ladell Pier, MD  atorvastatin (LIPITOR) 20 MG tablet Take 1 tablet (20 mg total) by mouth daily after supper. 12/07/20   Ladell Pier, MD  diclofenac Sodium (VOLTAREN) 1 % GEL Apply 4 g topically 4 (four) times daily as needed. 03/14/19   Hilts, Legrand Como, MD  FLUoxetine (PROZAC) 10 MG capsule Take 1 capsule (10 mg total) by mouth daily. 12/07/20   Ladell Pier, MD  furosemide (LASIX) 20 MG tablet Take 1 tablet (20 mg total) by mouth daily. 08/06/21   Bhagat, Crista Luria, PA  insulin glargine (LANTUS) 100 UNIT/ML Solostar Pen INJECT 14 UNITS INTO THE SKIN DAILY. 12/07/20 12/07/21  Ladell Pier, MD  Insulin Pen Needle (TRUEPLUS PEN NEEDLES) 32G X 4 MM MISC Use to inject insulin daily. 04/21/19   Fulp, Cammie, MD  lisinopril-hydrochlorothiazide (ZESTORETIC) 20-12.5 MG tablet Take 2 tablets by mouth daily. 12/07/20   Ladell Pier, MD  metFORMIN (GLUCOPHAGE-XR) 500 MG 24 hr tablet Take 1 tablet (500 mg total) by mouth 2 (two) times daily. 12/07/20   Ladell Pier, MD  methocarbamol (ROBAXIN) 750 MG tablet Take 1 tablet (750 mg total) by mouth 3 (three) times daily as needed (muscle spasm/pain). 06/26/21   Lajean Saver, MD  naproxen (NAPROSYN) 500 MG tablet Take 1  tablet (500 mg total) by mouth 2 (two) times daily with a meal. As needed for pain/headache; take after eating 04/21/19   Fulp, Cammie, MD  nitroGLYCERIN (NITRO-DUR) 0.1 mg/hr patch Apply 1/4 patch to heel area 12 hours daily 03/14/19   Hilts, Michael, MD  omeprazole (PRILOSEC) 20 MG capsule TAKE 1 CAPSULE (20 MG TOTAL) BY MOUTH DAILY. 01/02/21 01/02/22  Ladell Pier, MD  ONE TOUCH ULTRA TEST test strip USE  STRIP TO CHECK GLUCOSE THREE TIMES DAILY BEFORE MEAL(S) 10/23/16   Charlott Rakes, MD  PRESCRIPTION MEDICATION Take 0.5 tablets by mouth See admin instructions. 1/2 tablet of Bumetanide.  [provider]  valACYclovir (VALTREX) 1000 MG tablet Take 1 tablet (1,000 mg total) by mouth 2 (two) times daily. 10/25/19   Gildardo Pounds, NP    Family History Family History  Problem Relation Age of Onset   Hypertension Brother    Stroke Brother    Sickle cell trait Daughter    Hypertension Father    Heart disease Mother    Rheumatic fever Mother    Hypertension Mother    Stroke Mother    CAD Mother    Heart disease Paternal Aunt     Social History Social History   Tobacco Use   Smoking status: Never   Smokeless tobacco: Never  Substance Use Topics   Alcohol use: Yes    Comment: rarely   Drug use: No     Allergies   Other   Review of Systems Review of Systems  All other systems reviewed and are negative.    Physical Exam Triage Vital Signs ED Triage Vitals  Enc Vitals Group     BP 09/13/21 1956 140/85     Pulse Rate 09/13/21 1956 78     Resp 09/13/21 1956 16     Temp 09/13/21 1956 99.6 F (37.6 C)     Temp Source 09/13/21 1956 Oral     SpO2 09/13/21 1956 99 %     Weight --      Height --      Head Circumference --      Peak Flow --      Pain Score 09/13/21 1954 8     Pain Loc --      Pain Edu? --      Excl. in [place]? --    No data found.  Updated Vital Signs BP 140/85 (BP Location: Right Arm)   Pulse 78   Temp 99.6 F (37.6 C) (Oral)    Resp 16   LMP 06/29/2012   SpO2 99%   Visual Acuity Right Eye Distance:   Left Eye Distance:   Bilateral Distance:    Right Eye Near:   Left Eye Near:    Bilateral Near:     Physical Exam Vitals and nursing note reviewed.  Constitutional:      Appearance: She is well-developed.  HENT:     Head: Normocephalic.     Comments: Injected bilateral conjunctiva    Mouth/Throat:     Mouth: Mucous membranes are moist.     Pharynx: Posterior oropharyngeal erythema present.  Pulmonary:     Effort: Pulmonary effort is normal.  Abdominal:     General: There is no distension.  Musculoskeletal:        General: Normal range of motion.     Cervical back: Normal range of motion.  Neurological:     Mental Status: She is alert and oriented to person, place, and time.  Psychiatric:        Mood and Affect: Mood normal.      UC Treatments / Results  Labs (all labs ordered are listed, but only abnormal results are displayed) Labs Reviewed  POCT RAPID STREP A, ED / UC    EKG   Radiology No results found.  Procedures Procedures (including critical care time)  Medications Ordered in UC Medications - No data to display  Initial Impression / Assessment and Plan / UC Course  I have reviewed the triage vital signs and the nursing notes.  Pertinent labs & imaging results that were available during my care of the  patient were reviewed by me and considered in my medical decision making (see chart for details).  Is negative   Final Clinical Impressions(s) / UC Diagnoses   Final diagnoses:  Acute bacterial conjunctivitis of both eyes     Discharge Instructions      Warm compresses   ED Prescriptions     Medication Sig Dispense Auth. Provider   tobramycin (TOBREX) 0.3 % ophthalmic solution Place 1 drop into the right eye every 4 (four) hours for 10 days. 5 mL Fransico Meadow, [place]      PDMP not reviewed this encounter. An After Visit Summary was printed and given to  the patient.    Fransico Meadow, [place] 09/13/21 2013

## 2021-09-17 ENCOUNTER — Telehealth: Payer: Self-pay | Admitting: Orthopaedic Surgery

## 2021-09-17 NOTE — Telephone Encounter (Signed)
Pt wants to move forward with surgery and stated she discuss with Dr. Erlinda Hong last visit. Pt canceled upcoming appt. Please call pt at [phone].

## 2021-09-17 NOTE — Telephone Encounter (Signed)
Which hand does she want to do first?

## 2021-09-20 ENCOUNTER — Ambulatory Visit: Payer: Self-pay | Admitting: Orthopaedic Surgery

## 2021-11-13 ENCOUNTER — Other Ambulatory Visit: Payer: Self-pay

## 2021-11-13 ENCOUNTER — Encounter (HOSPITAL_BASED_OUTPATIENT_CLINIC_OR_DEPARTMENT_OTHER): Payer: Self-pay | Admitting: Orthopaedic Surgery

## 2021-11-19 ENCOUNTER — Encounter (HOSPITAL_BASED_OUTPATIENT_CLINIC_OR_DEPARTMENT_OTHER)
Admission: RE | Admit: 2021-11-19 | Discharge: 2021-11-19 | Disposition: A | Discharge status: Home / Self Care | Payer: Self-pay | Source: Ambulatory Visit | Attending: Orthopaedic Surgery | Admitting: Orthopaedic Surgery

## 2021-11-19 DIAGNOSIS — Z01818 Encounter for other preprocedural examination: Secondary | ICD-10-CM | POA: Insufficient documentation

## 2021-11-19 LAB — BASIC METABOLIC PANEL
Anion gap: 7 (ref 5–15)
BUN: 8 mg/dL (ref 6–20)
CO2: 26 mmol/L (ref 22–32)
Calcium: 8.9 mg/dL (ref 8.9–10.3)
Chloride: 106 mmol/L (ref 98–111)
Creatinine, Ser: 0.8 mg/dL (ref 0.44–1.00)
GFR, Estimated: >60 mL/min (ref >60)
Glucose, Bld: 183 mg/dL — ABNORMAL HIGH (ref 70–99)
Potassium: 4.5 mmol/L (ref 3.5–5.1)
Sodium: 139 mmol/L (ref 135–145)

## 2021-11-19 NOTE — Progress Notes (Addendum)
Sent text reminding pt to come in for lab work.   Enhanced Recovery after Surgery  Enhanced Recovery after Surgery is a protocol used to improve the stress on your body and your recovery after surgery.  Patient Instructions  The night before surgery:  No food after midnight. ONLY clear liquids after midnight  The day of surgery (if you do NOT have diabetes):  Drink ONE (1) Pre-Surgery Clear Ensure as directed.   This drink was given to you during your hospital  pre-op appointment visit. The pre-op nurse will instruct you on the time to drink the  Pre-Surgery Ensure depending on your surgery time. Finish the drink at the designated time by the pre-op nurse.  Nothing else to drink after completing the  Pre-Surgery Clear Ensure.  The day of surgery (if you have diabetes): Drink ONE (1) Gatorade 2 (G2) as directed. This drink was given to you during your hospital  pre-op appointment visit.  The pre-op nurse will instruct you on the time to drink the   Gatorade 2 (G2) depending on your surgery time. Color of the Gatorade may vary. Red is not allowed. Nothing else to drink after completing the  Gatorade 2 (G2).         If office.you have questions, please contact your surgeon's office 

## 2021-11-19 NOTE — Discharge Instructions (Signed)
   Postoperative instructions:  Weightbearing instructions: no lifting more than 10 lbs. for 4 weeks  Dressing instructions: Keep your dressing and/or splint clean and dry at all times.  It will be removed at your first post-operative appointment.  Your stitches and/or staples will be removed at this visit.  Incision instructions:  Do not soak your incision for 3 weeks after surgery.  If the incision gets wet, pat dry and do not scrub the incision.  Pain control:  You have been given a prescription to be taken as directed for post-operative pain control.  In addition, elevate the operative extremity above the heart at all times to prevent swelling and throbbing pain.  Take over-the-counter Colace, '100mg'$  by mouth twice a day while taking narcotic pain medications to help prevent constipation.  Follow up appointments: 1) 7 days for wound check. 2) Dr. Erlinda Hong as scheduled.   -------------------------------------------------------------------------------------------------------------  After Surgery Pain Control:  After your surgery, post-surgical discomfort or pain is likely. This discomfort can last several days to a few weeks. At certain times of the day your discomfort may be more intense.  Did you receive a nerve block?  A nerve block can provide pain relief for one hour to two days after your surgery. As long as the nerve block is working, you will experience little or no sensation in the area the surgeon operated on.  As the nerve block wears off, you will begin to experience pain or discomfort. It is very important that you begin taking your prescribed pain medication before the nerve block fully wears off. Treating your pain at the first sign of the block wearing off will ensure your pain is better controlled and more tolerable when full-sensation returns. Do not wait until the pain is intolerable, as the medicine will be less effective. It is better to treat pain in advance than to try and  catch up.  General Anesthesia:  If you did not receive a nerve block during your surgery, you will need to start taking your pain medication shortly after your surgery and should continue to do so as prescribed by your surgeon.  Pain Medication:  Most commonly we prescribe Vicodin and Percocet for post-operative pain. Both of these medications contain a combination of acetaminophen (Tylenol) and a narcotic to help control pain.   It takes between 30 and 45 minutes before pain medication starts to work. It is important to take your medication before your pain level gets too intense.   Nausea is a common side effect of many pain medications. You will want to eat something before taking your pain medicine to help prevent nausea.   If you are taking a prescription pain medication that contains acetaminophen, we recommend that you do not take additional over the counter acetaminophen (Tylenol).  Other pain relieving options:   Using a cold pack to ice the affected area a few times a day (15 to 20 minutes at a time) can help to relieve pain, reduce swelling and bruising.   Elevation of the affected area can also help to reduce pain and swelling.

## 2021-11-20 ENCOUNTER — Ambulatory Visit (HOSPITAL_BASED_OUTPATIENT_CLINIC_OR_DEPARTMENT_OTHER)
Admission: RE | Admit: 2021-11-20 | Discharge: 2021-11-20 | Disposition: A | Discharge status: Home / Self Care | Payer: Self-pay | Attending: Orthopaedic Surgery | Admitting: Orthopaedic Surgery

## 2021-11-20 ENCOUNTER — Ambulatory Visit (HOSPITAL_BASED_OUTPATIENT_CLINIC_OR_DEPARTMENT_OTHER): Payer: Self-pay | Admitting: Anesthesiology

## 2021-11-20 ENCOUNTER — Other Ambulatory Visit: Payer: Self-pay

## 2021-11-20 ENCOUNTER — Encounter (HOSPITAL_BASED_OUTPATIENT_CLINIC_OR_DEPARTMENT_OTHER): Payer: Self-pay | Admitting: Orthopaedic Surgery

## 2021-11-20 ENCOUNTER — Encounter (HOSPITAL_BASED_OUTPATIENT_CLINIC_OR_DEPARTMENT_OTHER)
Admission: RE | Disposition: A | Discharge status: Home / Self Care | Payer: Self-pay | Source: Home / Self Care | Attending: Orthopaedic Surgery

## 2021-11-20 DIAGNOSIS — G5601 Carpal tunnel syndrome, right upper limb: Secondary | ICD-10-CM

## 2021-11-20 DIAGNOSIS — E119 Type 2 diabetes mellitus without complications: Secondary | ICD-10-CM

## 2021-11-20 DIAGNOSIS — I110 Hypertensive heart disease with heart failure: Secondary | ICD-10-CM | POA: Insufficient documentation

## 2021-11-20 DIAGNOSIS — F32A Depression, unspecified: Secondary | ICD-10-CM | POA: Insufficient documentation

## 2021-11-20 DIAGNOSIS — Z6839 Body mass index (BMI) 39.0-39.9, adult: Secondary | ICD-10-CM | POA: Insufficient documentation

## 2021-11-20 DIAGNOSIS — E669 Obesity, unspecified: Secondary | ICD-10-CM | POA: Insufficient documentation

## 2021-11-20 DIAGNOSIS — J45909 Unspecified asthma, uncomplicated: Secondary | ICD-10-CM | POA: Insufficient documentation

## 2021-11-20 DIAGNOSIS — I509 Heart failure, unspecified: Secondary | ICD-10-CM

## 2021-11-20 DIAGNOSIS — G4730 Sleep apnea, unspecified: Secondary | ICD-10-CM | POA: Insufficient documentation

## 2021-11-20 DIAGNOSIS — Z794 Long term (current) use of insulin: Secondary | ICD-10-CM | POA: Insufficient documentation

## 2021-11-20 DIAGNOSIS — Z79899 Other long term (current) drug therapy: Secondary | ICD-10-CM | POA: Insufficient documentation

## 2021-11-20 DIAGNOSIS — Z7984 Long term (current) use of oral hypoglycemic drugs: Secondary | ICD-10-CM | POA: Insufficient documentation

## 2021-11-20 DIAGNOSIS — I10 Essential (primary) hypertension: Secondary | ICD-10-CM

## 2021-11-20 DIAGNOSIS — K219 Gastro-esophageal reflux disease without esophagitis: Secondary | ICD-10-CM | POA: Insufficient documentation

## 2021-11-20 HISTORY — PX: CARPAL TUNNEL RELEASE: SHX101

## 2021-11-20 HISTORY — DX: Sleep apnea, unspecified: G47.30

## 2021-11-20 LAB — GLUCOSE, CAPILLARY
Glucose-Capillary: 194 mg/dL — ABNORMAL HIGH (ref 70–99)
Glucose-Capillary: 172 mg/dL — ABNORMAL HIGH (ref 70–99)

## 2021-11-20 SURGERY — CARPAL TUNNEL RELEASE
Anesthesia: Monitor Anesthesia Care | Site: Wrist | Laterality: Right

## 2021-11-20 MED ORDER — LACTATED RINGERS IV SOLN: INTRAVENOUS | Status: DC

## 2021-11-20 MED ORDER — CEFAZOLIN SODIUM-DEXTROSE 2-4 GM/100ML-% IV SOLN
2.0000 g | INTRAVENOUS | Status: AC
  Administered 2021-11-20: 2 g via INTRAVENOUS

## 2021-11-20 MED ORDER — MIDAZOLAM HCL 2 MG/2ML IJ SOLN
INTRAMUSCULAR | Status: AC
  Filled 2021-11-20: qty 2

## 2021-11-20 MED ORDER — OXYCODONE HCL 5 MG/5ML PO SOLN: 5.0000 mg | Freq: Once | ORAL | Status: DC | PRN

## 2021-11-20 MED ORDER — PROPOFOL 10 MG/ML IV BOLUS
INTRAVENOUS | Status: DC | PRN
  Administered 2021-11-20 (×2): 20 mg via INTRAVENOUS

## 2021-11-20 MED ORDER — OXYCODONE HCL 5 MG PO TABS: 5.0000 mg | ORAL_TABLET | Freq: Once | ORAL | Status: DC | PRN

## 2021-11-20 MED ORDER — MEPERIDINE HCL 25 MG/ML IJ SOLN: 6.2500 mg | INTRAMUSCULAR | Status: DC | PRN

## 2021-11-20 MED ORDER — ACETAMINOPHEN 500 MG PO TABS
ORAL_TABLET | ORAL | Status: AC
  Filled 2021-11-20: qty 2

## 2021-11-20 MED ORDER — FENTANYL CITRATE (PF) 100 MCG/2ML IJ SOLN
INTRAMUSCULAR | Status: AC
  Filled 2021-11-20: qty 2

## 2021-11-20 MED ORDER — MIDAZOLAM HCL 5 MG/5ML IJ SOLN
INTRAMUSCULAR | Status: DC | PRN
  Administered 2021-11-20: 1 mg via INTRAVENOUS

## 2021-11-20 MED ORDER — ACETAMINOPHEN 500 MG PO TABS
1000.0000 mg | ORAL_TABLET | Freq: Once | ORAL | Status: AC
  Administered 2021-11-20: 1000 mg via ORAL

## 2021-11-20 MED ORDER — FENTANYL CITRATE (PF) 100 MCG/2ML IJ SOLN
INTRAMUSCULAR | Status: DC | PRN
  Administered 2021-11-20: 50 ug via INTRAVENOUS

## 2021-11-20 MED ORDER — AMISULPRIDE (ANTIEMETIC) 5 MG/2ML IV SOLN: 10.0000 mg | Freq: Once | INTRAVENOUS | Status: DC | PRN

## 2021-11-20 MED ORDER — ONDANSETRON HCL 4 MG/2ML IJ SOLN
4.0000 mg | Freq: Once | INTRAMUSCULAR | Status: AC | PRN
  Administered 2021-11-20: 4 mg via INTRAVENOUS

## 2021-11-20 MED ORDER — HYDROCODONE-ACETAMINOPHEN 5-325 MG PO TABS
1.0000 | ORAL_TABLET | Freq: Four times a day (QID) | ORAL | 0 refills | Status: DC | PRN
Start: 2021-11-20 — End: 2023-09-21

## 2021-11-20 MED ORDER — KETOROLAC TROMETHAMINE 30 MG/ML IJ SOLN: 30.0000 mg | Freq: Once | INTRAMUSCULAR | Status: DC | PRN

## 2021-11-20 MED ORDER — ONDANSETRON HCL 4 MG/2ML IJ SOLN
INTRAMUSCULAR | Status: AC
  Filled 2021-11-20: qty 2

## 2021-11-20 MED ORDER — CEFAZOLIN SODIUM-DEXTROSE 2-4 GM/100ML-% IV SOLN
INTRAVENOUS | Status: AC
  Filled 2021-11-20: qty 100

## 2021-11-20 MED ORDER — BUPIVACAINE HCL (PF) 0.25 % IJ SOLN
INTRAMUSCULAR | Status: DC | PRN
  Administered 2021-11-20: 10 mL

## 2021-11-20 MED ORDER — HYDROMORPHONE HCL 1 MG/ML IJ SOLN: 0.2500 mg | INTRAMUSCULAR | Status: DC | PRN

## 2021-11-20 MED ORDER — PROPOFOL 500 MG/50ML IV EMUL
INTRAVENOUS | Status: DC | PRN
  Administered 2021-11-20: 100 ug/kg/min via INTRAVENOUS

## 2021-11-20 MED ORDER — 0.9 % SODIUM CHLORIDE (POUR BTL) OPTIME
TOPICAL | Status: DC | PRN
  Administered 2021-11-20: 200 mL

## 2021-11-20 MED ORDER — LACTATED RINGERS IV SOLN
INTRAVENOUS | Status: DC
Start: 2021-11-20 — End: 2021-11-20

## 2021-11-20 SURGICAL SUPPLY — 45 items
BAND RUBBER #18 3X1/16 STRL (MISCELLANEOUS) ×2 IMPLANT
BLADE MINI RND TIP GREEN BEAV (BLADE) ×1 IMPLANT
BLADE SURG 15 STRL LF DISP TIS (BLADE) ×1 IMPLANT
BLADE SURG 15 STRL SS (BLADE) ×1
BNDG ELASTIC 3X5.8 VLCR STR LF (GAUZE/BANDAGES/DRESSINGS) ×1 IMPLANT
BNDG ESMARK 4X9 LF (GAUZE/BANDAGES/DRESSINGS) ×1 IMPLANT
BNDG PLASTER X FAST 3X3 WHT LF (CAST SUPPLIES) IMPLANT
BRUSH SCRUB EZ PLAIN DRY (MISCELLANEOUS) ×1 IMPLANT
CANISTER SUCT 1200ML W/VALVE (MISCELLANEOUS) ×1 IMPLANT
CORD BIPOLAR FORCEPS 12FT (ELECTRODE) ×1 IMPLANT
COVER BACK TABLE 60X90IN (DRAPES) ×1 IMPLANT
COVER MAYO STAND STRL (DRAPES) ×1 IMPLANT
CUFF TOURN SGL QUICK 18X4 (TOURNIQUET CUFF) IMPLANT
DRAPE EXTREMITY T 121X128X90 (DISPOSABLE) ×1 IMPLANT
DRAPE IMP U-DRAPE 54X76 (DRAPES) ×1 IMPLANT
DRAPE SURG 17X23 STRL (DRAPES) ×1 IMPLANT
GAUZE 4X4 16PLY ~~LOC~~+RFID DBL (SPONGE) IMPLANT
GAUZE SPONGE 4X4 12PLY STRL (GAUZE/BANDAGES/DRESSINGS) ×1 IMPLANT
GAUZE XEROFORM 1X8 LF (GAUZE/BANDAGES/DRESSINGS) ×1 IMPLANT
GLOVE BIOGEL PI IND STRL 7.5 (GLOVE) ×1 IMPLANT
GLOVE BIOGEL PI INDICATOR 7.5 (GLOVE) ×1
GLOVE ECLIPSE 7.0 STRL STRAW (GLOVE) ×1 IMPLANT
GLOVE INDICATOR 7.0 STRL GRN (GLOVE) ×1 IMPLANT
GLOVE SURG SYN 7.5  E (GLOVE) ×1
GLOVE SURG SYN 7.5 E (GLOVE) ×1 IMPLANT
GLOVE SURG SYN 7.5 PF PI (GLOVE) ×1 IMPLANT
GOWN STRL REIN XL XLG (GOWN DISPOSABLE) ×2 IMPLANT
GOWN STRL REUS W/ TWL LRG LVL3 (GOWN DISPOSABLE) ×1 IMPLANT
GOWN STRL REUS W/TWL LRG LVL3 (GOWN DISPOSABLE) ×1
NDL HYPO 25X1 1.5 SAFETY (NEEDLE) IMPLANT
NEEDLE HYPO 25X1 1.5 SAFETY (NEEDLE) IMPLANT
NS IRRIG 1000ML POUR BTL (IV SOLUTION) ×1 IMPLANT
PACK BASIN DAY SURGERY FS (CUSTOM PROCEDURE TRAY) ×1 IMPLANT
PAD CAST 3X4 CTTN HI CHSV (CAST SUPPLIES) ×1 IMPLANT
PADDING CAST COTTON 3X4 STRL (CAST SUPPLIES) ×1
SHEET MEDIUM DRAPE 40X70 STRL (DRAPES) ×1 IMPLANT
SPIKE FLUID TRANSFER (MISCELLANEOUS) IMPLANT
STOCKINETTE 4X48 STRL (DRAPES) ×1 IMPLANT
SUT ETHILON 3 0 PS 1 (SUTURE) ×1 IMPLANT
SYR BULB EAR ULCER 3OZ GRN STR (SYRINGE) ×1 IMPLANT
SYR CONTROL 10ML LL (SYRINGE) IMPLANT
TOWEL GREEN STERILE FF (TOWEL DISPOSABLE) ×1 IMPLANT
TRAY DSU PREP LF (CUSTOM PROCEDURE TRAY) ×1 IMPLANT
TUBE CONNECTING 20X1/4 (TUBING) IMPLANT
UNDERPAD 30X36 HEAVY ABSORB (UNDERPADS AND DIAPERS) ×1 IMPLANT

## 2021-11-20 NOTE — Anesthesia Preprocedure Evaluation (Addendum)
Anesthesia Evaluation  Patient identified by MRN, date of birth, ID band Patient awake    Reviewed: Allergy & Precautions, NPO status , Patient's Chart, lab work & pertinent test results  History of Anesthesia Complications (+) PROLONGED EMERGENCE and history of anesthetic complications (was told she was slow to wake up after hysterectomy 2014, but nothing noted in postop note. no issues w/ GA for ankle surgery 2017 )  Airway Mallampati: II  TM Distance: >3 FB Neck ROM: Full    Dental no notable dental hx.    Pulmonary asthma , sleep apnea (no CPAP recommended per pt) ,    Pulmonary exam normal        Cardiovascular hypertension, Pt. on medications +CHF (LVEF 50-93%, grade 1 diastolic dysfunction on echo 2021)  Normal cardiovascular exam  Echo 2021: EF 50-55%, moderate LVH, grade I DD, valves ok   Neuro/Psych  Headaches, PSYCHIATRIC DISORDERS Depression    GI/Hepatic Neg liver ROS, GERD  Medicated and Controlled,  Endo/Other  diabetes, Type 2, Insulin Dependent, Oral Hypoglycemic AgentsObesity BMI 38 Last a1c 7.4  Renal/GU negative Renal ROS  negative genitourinary   Musculoskeletal negative musculoskeletal ROS (+)   Abdominal (+) + obese,   Peds  Hematology negative hematology ROS (+)   Anesthesia Other Findings   Reproductive/Obstetrics negative OB ROS S/p hysterectomy                            Anesthesia Physical Anesthesia Plan  ASA: 3  Anesthesia Plan: MAC   Post-op Pain Management: Tylenol PO (pre-op)*   Induction:   PONV Risk Score and Plan: 2 and Propofol infusion and TIVA  Airway Management Planned: Natural Airway and Simple Face Mask  Additional Equipment: None  Intra-op Plan:   Post-operative Plan:   Informed Consent: I have reviewed the patients History and Physical, chart, labs and discussed the procedure including the risks, benefits and alternatives for the  proposed anesthesia with the patient or authorized representative who has indicated his/her understanding and acceptance.       Plan Discussed with: CRNA  Anesthesia Plan Comments:        Anesthesia Quick Evaluation

## 2021-11-20 NOTE — H&P (Signed)
PREOPERATIVE H&P  Chief Complaint: right carpal tunnel syndrome  HPI: Julia Dunn is a 51 y.o. female who presents for surgical treatment of right carpal tunnel syndrome.  She denies any changes in medical history.  Past Medical History:  Diagnosis Date   Anemia Dx 2013   Asthma    Bacterial vaginosis 2376   Complication of anesthesia    pt states was informed had difficult time awakening following hysterectomy    Depression    Depression    Diabetes mellitus Dx 2005   Fibroids 09/05/2011   08/08/2011: Anteverted uterus. Diffuse fibroid involvement. Left of midline fibroid with submucosal component. 3.4cm x 2.8cmx 3.4cm.    GERD (gastroesophageal reflux disease)    Gestational diabetes    H/O shortness of breath    pt states has improved since lossing weight; triggers currently are enviromental pt states can make it to top of stairs w/o having to stop to catch her breath   Headache(784.0)    migraines   History of blood transfusion 2013/2014   History of bronchitis    Hx of blood clots    Hypertension Dx 2015   Kidney infection 2015   Menorrhagia 09/27/2010   Pneumonia    Postpartum depression    Pregnancy induced hypertension    Sleep apnea    mild, no CPAP recommended   Trichomonas    Past Surgical History:  Procedure Laterality Date   ABDOMINAL HYSTERECTOMY N/A 11/03/2012   Procedure: HYSTERECTOMY ABDOMINAL;  Surgeon: Ena Dawley, MD;  Location: [facility];  Service: Gynecology;  Laterality: N/A;   BILATERAL SALPINGECTOMY Bilateral 11/03/2012   Procedure: BILATERAL SALPINGECTOMY;  Surgeon: Ena Dawley, MD;  Location: [facility];  Service: Gynecology;  Laterality: Bilateral;   BURCH PROCEDURE N/A 11/03/2012   Procedure: BURCH PROCEDURE;  Surgeon: Ena Dawley, MD;  Location: [facility];  Service: Gynecology;  Laterality: N/A;   CYSTOSCOPY N/A 11/03/2012   Procedure: CYSTOSCOPY;  Surgeon: Ena Dawley, MD;  Location: [facility];  Service: Gynecology;  Laterality: N/A;    DILATION AND CURETTAGE OF UTERUS     LAPAROSCOPY N/A 11/03/2012   Procedure: LAPAROSCOPY DIAGNOSTIC;  Surgeon: Ena Dawley, MD;  Location: [facility];  Service: Gynecology;  Laterality: N/A;   ORIF ANKLE FRACTURE Left 01/04/2016   Procedure: OPEN REDUCTION INTERNAL FIXATION (ORIF) LEFT BIMALLEOLAR ANKLE FRACTURE;  Surgeon: Mcarthur Rossetti, MD;  Location: [facility];  Service: Orthopedics;  Laterality: Left;   TUBAL LIGATION     WISDOM TOOTH EXTRACTION     3 teeth   Social History   Socioeconomic History   Marital status: Significant Other    Spouse name: Not on file   Number of children: 3   Years of education: Not on file   Highest education level: Not on file  Occupational History   Occupation: Unemployed  Tobacco Use   Smoking status: Never   Smokeless tobacco: Never  Substance and Sexual Activity   Alcohol use: Yes    Comment: social   Drug use: No   Sexual activity: Yes    Birth control/protection: Surgical    Comment: hysterectomy  Other Topics Concern   Not on file  Social History Narrative   Not on file   Social Determinants of Health   Financial Resource Strain: Not on file  Food Insecurity: Not on file  Transportation Needs: Not on file  Physical Activity: Not on file  Stress: Not on file  Social Connections: Not on file   Family History  Problem Relation Age of Onset   Hypertension Brother    Stroke Brother    Sickle cell trait Daughter    Hypertension Father    Heart disease Mother    Rheumatic fever Mother    Hypertension Mother    Stroke Mother    CAD Mother    Heart disease Paternal Aunt    No Active Allergies Prior to Admission medications   Medication Sig Start Date End Date Taking? Authorizing Provider  albuterol (VENTOLIN HFA) 108 (90 Base) MCG/ACT inhaler Inhale 2 puffs into the lungs every 6 (six) hours as needed for wheezing or shortness of breath. 10/20/19  Yes McClung, Angela M, PA-C  albuterol (VENTOLIN HFA) 108 (90 Base) MCG/ACT  inhaler Inhale 1-2 puffs into the lungs every 6 (six) hours as needed for wheezing or shortness of breath. 10/20/19  Yes McClung, Angela M, PA-C  amLODipine (NORVASC) 5 MG tablet TAKE 1 TABLET (5 MG TOTAL) BY MOUTH DAILY. 12/07/20 12/13/21 Yes Ladell Pier, MD  atorvastatin (LIPITOR) 20 MG tablet Take 1 tablet (20 mg total) by mouth daily after supper. 12/07/20  Yes Ladell Pier, MD  diclofenac Sodium (VOLTAREN) 1 % GEL Apply 4 g topically 4 (four) times daily as needed. 03/14/19  Yes Hilts, Legrand Como, MD  FLUoxetine (PROZAC) 10 MG capsule Take 1 capsule (10 mg total) by mouth daily. 12/07/20  Yes Ladell Pier, MD  furosemide (LASIX) 20 MG tablet Take 1 tablet (20 mg total) by mouth daily. 08/06/21  Yes Bhagat, Bhavinkumar, PA  insulin glargine (LANTUS) 100 UNIT/ML Solostar Pen INJECT 14 UNITS INTO THE SKIN DAILY. 12/07/20 12/23/21 Yes Ladell Pier, MD  Insulin Pen Needle (TRUEPLUS PEN NEEDLES) 32G X 4 MM MISC Use to inject insulin daily. 04/21/19  Yes Fulp, Cammie, MD  lisinopril-hydrochlorothiazide (ZESTORETIC) 20-12.5 MG tablet Take 2 tablets by mouth daily. 12/07/20  Yes Ladell Pier, MD  metFORMIN (GLUCOPHAGE-XR) 500 MG 24 hr tablet Take 1 tablet (500 mg total) by mouth 2 (two) times daily. 12/07/20  Yes Ladell Pier, MD  omeprazole (PRILOSEC) 20 MG capsule TAKE 1 CAPSULE (20 MG TOTAL) BY MOUTH DAILY. 01/02/21 01/02/22 Yes Ladell Pier, MD  ONE TOUCH ULTRA TEST test strip USE  STRIP TO CHECK GLUCOSE THREE TIMES DAILY BEFORE MEAL(S) 10/23/16   Charlott Rakes, MD     Positive ROS: All other systems have been reviewed and were otherwise negative with the exception of those mentioned in the HPI and as above.  Physical Exam: General: Alert, no acute distress Cardiovascular: No pedal edema Respiratory: No cyanosis, no use of accessory musculature GI: abdomen soft Skin: No lesions in the area of chief complaint Neurologic: Sensation intact distally Psychiatric: Patient is  competent for consent with normal mood and affect Lymphatic: no lymphedema  MUSCULOSKELETAL: exam stable  Assessment: right carpal tunnel syndrome  Plan: Plan for Procedure(s): RIGHT CARPAL TUNNEL RELEASE  The risks benefits and alternatives were discussed with the patient including but not limited to the risks of nonoperative treatment, versus surgical intervention including infection, bleeding, nerve injury,  blood clots, cardiopulmonary complications, morbidity, mortality, among others, and they were willing to proceed.   Eduard Roux, MD 11/20/2021 11:04 AM

## 2021-11-20 NOTE — Op Note (Signed)
   Carpal tunnel op note  DATE OF SURGERY:11/20/2021  PREOPERATIVE DIAGNOSIS:  Right carpal tunnel syndrome  POSTOPERATIVE DIAGNOSIS: same  PROCEDURE: Right carpal tunnel release. CPT 28768  SURGEON: Marianna Payment, M.D.  ASSIST: None  ANESTHESIA:  Local and MAC  TOURNIQUET TIME: less than 20 minutes  BLOOD LOSS: Minimal.  COMPLICATIONS: None.  PATHOLOGY: None.  INDICATIONS: The patient is a 51 y.o. -year-old female who presented with carpal tunnel syndrome failing nonsurgical management, indicated for surgical release.  DESCRIPTION OF PROCEDURE: The patient was identified in the preoperative holding area.  The operative site was marked by the surgeon and confirmed by the patient.  The patient was brought back to the operating room.  MAC anesthesia was administered.  Local anesthetic with epi was injected into the operative site.  A well padded nonsterile tourniquet was placed. The operative extremity was prepped and draped in standard sterile fashion.  A timeout was performed.  Preoperative antibiotics were given.   A palmar incision was made about 5 mm ulnar to the thenar crease.  The palmar aponeurosis was exposed and divided in line with the skin incision. The palmaris brevis was visualized and divided.  The distal edge of the transcarpal ligament was identified. A hemostat was inserted into the carpal tunnel to protect the median nerve and the flexor tendons. Then, the transverse carpal ligament was released under direct visualization. Proximally, a subcutaneous tunnel was made allowing a Sewell retractor to be placed. Then, the distal portion of the antebrachial fascia was released. Distally, all fibrous bands were released. The median nerve was visualized, and the fat pad was exposed. Wound was irrigated and closed with 4-0 nylon sutures. Sterile dressing applied. The patient was transferred to the recovery room in stable condition after all counts were correct.  POSTOPERATIVE  PLAN: To start nerve gliding exercises as tolerated and no heavy lifting for four weeks.  Eduard Roux, M.D. [facility]  12:37 PM

## 2021-11-20 NOTE — Transfer of Care (Signed)
Immediate Anesthesia Transfer of Care Note  Patient: Julia Dunn  Procedure(s) Performed: RIGHT CARPAL TUNNEL RELEASE (Right: Wrist)  Patient Location: PACU  Anesthesia Type:MAC  Level of Consciousness: sedated  Airway & Oxygen Therapy: Patient Spontanous Breathing and Patient connected to face mask oxygen  Post-op Assessment: Report given to RN and Post -op Vital signs reviewed and stable  Post vital signs: Reviewed and stable  Last Vitals:  Vitals Value Taken Time  BP 156/89 11/20/21 1237  Temp    Pulse 54 11/20/21 1239  Resp 15 11/20/21 1239  SpO2 99 % 11/20/21 1239  Vitals shown include unvalidated device data.  Last Pain:  Vitals:   11/20/21 1041  TempSrc: Oral  PainSc: 10-Worst pain ever         Complications: No notable events documented.

## 2021-11-20 NOTE — Anesthesia Postprocedure Evaluation (Signed)
Anesthesia Post Note  Patient: Julia Dunn  Procedure(s) Performed: RIGHT CARPAL TUNNEL RELEASE (Right: Wrist)     Patient location during evaluation: PACU Anesthesia Type: MAC Level of consciousness: awake and alert Pain management: pain level controlled Vital Signs Assessment: post-procedure vital signs reviewed and stable Respiratory status: spontaneous breathing, nonlabored ventilation and respiratory function stable Cardiovascular status: blood pressure returned to baseline Postop Assessment: no apparent nausea or vomiting Anesthetic complications: no   No notable events documented.  Last Vitals:  Vitals:   11/20/21 1300 11/20/21 1315  BP: (!) 144/103 (!) 170/111  Pulse: (!) 56 (!) 50  Resp: 14 13  Temp:    SpO2: 97% 99%    Last Pain:  Vitals:   11/20/21 1300  TempSrc:   PainSc: 0-No pain                 Marthenia Rolling

## 2021-11-27 ENCOUNTER — Ambulatory Visit (INDEPENDENT_AMBULATORY_CARE_PROVIDER_SITE_OTHER): Payer: Self-pay | Admitting: Physician Assistant

## 2021-11-27 ENCOUNTER — Encounter: Payer: Self-pay | Admitting: Orthopaedic Surgery

## 2021-11-27 DIAGNOSIS — Z9889 Other specified postprocedural states: Secondary | ICD-10-CM

## 2021-11-27 DIAGNOSIS — G5601 Carpal tunnel syndrome, right upper limb: Secondary | ICD-10-CM

## 2021-11-27 NOTE — Progress Notes (Signed)
Post-Op Visit Note   Patient: Julia Dunn           Date of Birth: [birth year]           MRN: [id number] Visit Date: 11/27/2021 PCP: Ladell Pier, MD   Assessment & Plan:  Chief Complaint:  Chief Complaint  Patient presents with   Right Wrist - Follow-up    Right carpal tunnel release 11/20/2021   Visit Diagnoses:  1. Right carpal tunnel syndrome   2. S/P carpal tunnel release     Plan: Patient is a pleasant 51 year old female who comes in today 1 week status post right carpal tunnel release 09/20/2021.  She has been doing okay.  She is still in some pain which is relieved with Norco.  She notes slight improvement in paresthesias.  Overall, doing okay.  Examination of her right wrist reveals a well-healing surgical incision with nylon sutures in place.  No evidence of infection or cellulitis.  Fingers are warm well perfused.  Today, her wound was cleaned and recovered.  She has a removable splint at home which she will put on today and wear at all times for the next week.  Follow-up next week for suture removal.  She has been instructed of no heavy lifting or submerging her hand underwater for another 3 weeks.  Call with concerns or questions.  Follow-Up Instructions: Return in about 1 week (around 12/04/2021).   Orders:  No orders of the defined types were placed in this encounter.  No orders of the defined types were placed in this encounter.   Imaging: No new imaging  PMFS History: Patient Active Problem List   Diagnosis Date Noted   Bilateral hand pain 08/09/2021   Right carpal tunnel syndrome 08/09/2021   Left carpal tunnel syndrome 08/09/2021   Acute on chronic diastolic congestive heart failure ([place]) 08/03/2021   Closed displaced bimalleolar fracture of left ankle 01/04/2016   Abnormal EKG 06/22/2014   Abnormal CT scan of lung 06/22/2014   Dyspnea 06/19/2014   Microscopic hematuria 04/07/2014   Onychomycosis 05/31/2013   BI-RADS category 3 mammogram result  03/07/2013   Vitamin D insufficiency 08/13/2011   Morbid obesity ([place]) 08/10/2007   ALLERGIC RHINITIS, SEASONAL 07/14/2006   DM2 (diabetes mellitus, type 2) 05/28/2006   DEPRESSIVE DISORDER, NOS 05/28/2006   HTN (hypertension) 05/28/2006   Past Medical History:  Diagnosis Date   Anemia Dx 2013   Asthma    Bacterial vaginosis 1700   Complication of anesthesia    pt states was informed had difficult time awakening following hysterectomy    Depression    Depression    Diabetes mellitus Dx 2005   Fibroids 09/05/2011   08/08/2011: Anteverted uterus. Diffuse fibroid involvement. Left of midline fibroid with submucosal component. 3.4cm x 2.8cmx 3.4cm.    GERD (gastroesophageal reflux disease)    Gestational diabetes    H/O shortness of breath    pt states has improved since lossing weight; triggers currently are enviromental pt states can make it to top of stairs w/o having to stop to catch her breath   Headache(784.0)    migraines   History of blood transfusion 2013/2014   History of bronchitis    Hx of blood clots    Hypertension Dx 2015   Kidney infection 2015   Menorrhagia 09/27/2010   Pneumonia    Postpartum depression    Pregnancy induced hypertension    Sleep apnea    mild, no CPAP recommended   Trichomonas  Family History  Problem Relation Age of Onset   Hypertension Brother    Stroke Brother    Sickle cell trait Daughter    Hypertension Father    Heart disease Mother    Rheumatic fever Mother    Hypertension Mother    Stroke Mother    CAD Mother    Heart disease Paternal Aunt     Past Surgical History:  Procedure Laterality Date   ABDOMINAL HYSTERECTOMY N/A 11/03/2012   Procedure: HYSTERECTOMY ABDOMINAL;  Surgeon: Ena Dawley, MD;  Location: [facility];  Service: Gynecology;  Laterality: N/A;   BILATERAL SALPINGECTOMY Bilateral 11/03/2012   Procedure: BILATERAL SALPINGECTOMY;  Surgeon: Ena Dawley, MD;  Location: [facility];  Service: Gynecology;  Laterality:  Bilateral;   BURCH PROCEDURE N/A 11/03/2012   Procedure: BURCH PROCEDURE;  Surgeon: Ena Dawley, MD;  Location: [facility];  Service: Gynecology;  Laterality: N/A;   CARPAL TUNNEL RELEASE Right 11/20/2021   Procedure: RIGHT CARPAL TUNNEL RELEASE;  Surgeon: Leandrew Koyanagi, MD;  Location: [facility];  Service: Orthopedics;  Laterality: Right;   CYSTOSCOPY N/A 11/03/2012   Procedure: CYSTOSCOPY;  Surgeon: Ena Dawley, MD;  Location: [facility];  Service: Gynecology;  Laterality: N/A;   DILATION AND CURETTAGE OF UTERUS     LAPAROSCOPY N/A 11/03/2012   Procedure: LAPAROSCOPY DIAGNOSTIC;  Surgeon: Ena Dawley, MD;  Location: [facility];  Service: Gynecology;  Laterality: N/A;   ORIF ANKLE FRACTURE Left 01/04/2016   Procedure: OPEN REDUCTION INTERNAL FIXATION (ORIF) LEFT BIMALLEOLAR ANKLE FRACTURE;  Surgeon: Mcarthur Rossetti, MD;  Location: [facility];  Service: Orthopedics;  Laterality: Left;   TUBAL LIGATION     WISDOM TOOTH EXTRACTION     3 teeth   Social History   Occupational History   Occupation: Unemployed  Tobacco Use   Smoking status: Never   Smokeless tobacco: Never  Substance and Sexual Activity   Alcohol use: Yes    Comment: social   Drug use: No   Sexual activity: Yes    Birth control/protection: Surgical    Comment: hysterectomy

## 2021-12-10 ENCOUNTER — Ambulatory Visit (INDEPENDENT_AMBULATORY_CARE_PROVIDER_SITE_OTHER): Payer: Self-pay | Admitting: Physician Assistant

## 2021-12-10 DIAGNOSIS — Z9889 Other specified postprocedural states: Secondary | ICD-10-CM

## 2021-12-10 DIAGNOSIS — G5601 Carpal tunnel syndrome, right upper limb: Secondary | ICD-10-CM

## 2021-12-10 NOTE — Progress Notes (Signed)
Post-Op Visit Note   Patient: Julia Dunn           Date of Birth: [birth year]           MRN: [id number] Visit Date: 12/10/2021 PCP: Ladell Pier, MD   Assessment & Plan:  Chief Complaint:  Chief Complaint  Patient presents with   Right Hand - Routine Post Op   Visit Diagnoses:  1. Right carpal tunnel syndrome   2. S/P carpal tunnel release     Plan: Patient is a pleasant 51 year old female who comes in today almost 3 weeks status post right carpal tunnel release 11/20/2021.  She has been doing well.  She still notes slight paresthesias primarily to her long fingertip.  Overall, doing well.  Examination of the right wrist reveals a fully healed surgical scar without complication.  Nylon sutures in place.  Fingers are warm well perfused.  She does have slight decrease sensation to the long finger tip.  Today, sutures were removed and Steri-Strips applied.  She has been instructed of no heavy lifting or submerging her hand underwater until she is 4 weeks out from surgery.  She will continue with range of motion exercises.  Follow-up in 3 weeks for repeat evaluation.  Call with concerns or questions.  Follow-Up Instructions: Return in about 3 weeks (around 12/31/2021).   Orders:  No orders of the defined types were placed in this encounter.  No orders of the defined types were placed in this encounter.   Imaging: No new imaging  PMFS History: Patient Active Problem List   Diagnosis Date Noted   Bilateral hand pain 08/09/2021   Right carpal tunnel syndrome 08/09/2021   Left carpal tunnel syndrome 08/09/2021   Acute on chronic diastolic congestive heart failure ([place]) 08/03/2021   Closed displaced bimalleolar fracture of left ankle 01/04/2016   Abnormal EKG 06/22/2014   Abnormal CT scan of lung 06/22/2014   Dyspnea 06/19/2014   Microscopic hematuria 04/07/2014   Onychomycosis 05/31/2013   BI-RADS category 3 mammogram result 03/07/2013   Vitamin D insufficiency  08/13/2011   Morbid obesity ([place]) 08/10/2007   ALLERGIC RHINITIS, SEASONAL 07/14/2006   DM2 (diabetes mellitus, type 2) 05/28/2006   DEPRESSIVE DISORDER, NOS 05/28/2006   HTN (hypertension) 05/28/2006   Past Medical History:  Diagnosis Date   Anemia Dx 2013   Asthma    Bacterial vaginosis 6384   Complication of anesthesia    pt states was informed had difficult time awakening following hysterectomy    Depression    Depression    Diabetes mellitus Dx 2005   Fibroids 09/05/2011   08/08/2011: Anteverted uterus. Diffuse fibroid involvement. Left of midline fibroid with submucosal component. 3.4cm x 2.8cmx 3.4cm.    GERD (gastroesophageal reflux disease)    Gestational diabetes    H/O shortness of breath    pt states has improved since lossing weight; triggers currently are enviromental pt states can make it to top of stairs w/o having to stop to catch her breath   Headache(784.0)    migraines   History of blood transfusion 2013/2014   History of bronchitis    Hx of blood clots    Hypertension Dx 2015   Kidney infection 2015   Menorrhagia 09/27/2010   Pneumonia    Postpartum depression    Pregnancy induced hypertension    Sleep apnea    mild, no CPAP recommended   Trichomonas     Family History  Problem Relation Age of Onset   Hypertension  Brother    Stroke Brother    Sickle cell trait Daughter    Hypertension Father    Heart disease Mother    Rheumatic fever Mother    Hypertension Mother    Stroke Mother    CAD Mother    Heart disease Paternal Aunt     Past Surgical History:  Procedure Laterality Date   ABDOMINAL HYSTERECTOMY N/A 11/03/2012   Procedure: HYSTERECTOMY ABDOMINAL;  Surgeon: Ena Dawley, MD;  Location: [facility];  Service: Gynecology;  Laterality: N/A;   BILATERAL SALPINGECTOMY Bilateral 11/03/2012   Procedure: BILATERAL SALPINGECTOMY;  Surgeon: Ena Dawley, MD;  Location: [facility];  Service: Gynecology;  Laterality: Bilateral;   BURCH PROCEDURE N/A  11/03/2012   Procedure: BURCH PROCEDURE;  Surgeon: Ena Dawley, MD;  Location: [facility];  Service: Gynecology;  Laterality: N/A;   CARPAL TUNNEL RELEASE Right 11/20/2021   Procedure: RIGHT CARPAL TUNNEL RELEASE;  Surgeon: Leandrew Koyanagi, MD;  Location: [facility];  Service: Orthopedics;  Laterality: Right;   CYSTOSCOPY N/A 11/03/2012   Procedure: CYSTOSCOPY;  Surgeon: Ena Dawley, MD;  Location: [facility];  Service: Gynecology;  Laterality: N/A;   DILATION AND CURETTAGE OF UTERUS     LAPAROSCOPY N/A 11/03/2012   Procedure: LAPAROSCOPY DIAGNOSTIC;  Surgeon: Ena Dawley, MD;  Location: [facility];  Service: Gynecology;  Laterality: N/A;   ORIF ANKLE FRACTURE Left 01/04/2016   Procedure: OPEN REDUCTION INTERNAL FIXATION (ORIF) LEFT BIMALLEOLAR ANKLE FRACTURE;  Surgeon: Mcarthur Rossetti, MD;  Location: [facility];  Service: Orthopedics;  Laterality: Left;   TUBAL LIGATION     WISDOM TOOTH EXTRACTION     3 teeth   Social History   Occupational History   Occupation: Unemployed  Tobacco Use   Smoking status: Never   Smokeless tobacco: Never  Substance and Sexual Activity   Alcohol use: Yes    Comment: social   Drug use: No   Sexual activity: Yes    Birth control/protection: Surgical    Comment: hysterectomy

## 2022-02-25 ENCOUNTER — Other Ambulatory Visit: Payer: Self-pay | Admitting: Internal Medicine

## 2022-02-25 ENCOUNTER — Other Ambulatory Visit: Payer: Self-pay

## 2022-02-25 DIAGNOSIS — F331 Major depressive disorder, recurrent, moderate: Secondary | ICD-10-CM

## 2022-02-25 DIAGNOSIS — E669 Obesity, unspecified: Secondary | ICD-10-CM

## 2022-02-25 DIAGNOSIS — E1169 Type 2 diabetes mellitus with other specified complication: Secondary | ICD-10-CM

## 2022-02-25 DIAGNOSIS — I152 Hypertension secondary to endocrine disorders: Secondary | ICD-10-CM

## 2022-03-04 ENCOUNTER — Other Ambulatory Visit: Payer: Self-pay

## 2022-03-17 ENCOUNTER — Other Ambulatory Visit: Payer: Self-pay

## 2022-03-28 ENCOUNTER — Ambulatory Visit: Payer: Self-pay

## 2022-03-28 NOTE — Telephone Encounter (Signed)
    Chief Complaint: Chest pain, radiates into back, congestion, cough,fatigue, headache, tearful Symptoms: Above Frequency: 4 days Pertinent Negatives: Patient denies fever Disposition: '[x]'$ ED /'[]'$ Urgent Care (no appt availability in office) / '[]'$ Appointment(In office/virtual)/ '[]'$  [facility]/ '[]'$ Home Care/ '[]'$ Refused Recommended Disposition /'[]'$ [facility]/ '[]'$  Follow-up with PCP Additional Notes: Pt. Will call 911.  Reason for Disposition  [1] Chest pain lasts > 5 minutes AND [2] age > 75 AND [3] one or more cardiac risk factors (e.g., diabetes, high blood pressure, high cholesterol, smoker, or strong family history of heart disease)  Answer Assessment - Initial Assessment Questions 1. LOCATION: "Where does it hurt?"       Left side 2. RADIATION: "Does the pain go anywhere else?" (e.g., into neck, jaw, arms, back)     Neck in back, and mid-back 3. ONSET: "When did the chest pain begin?" (Minutes, hours or days)      4 days ago 4. PATTERN: "Does the pain come and go, or has it been constant since it started?"  "Does it get worse with exertion?"      Comes and goes 5. DURATION: "How long does it last" (e.g., seconds, minutes, hours)     Minutes 6. SEVERITY: "How bad is the pain?"  (e.g., Scale 1-10; mild, moderate, or severe)    - MILD (1-3): doesn't interfere with normal activities     - MODERATE (4-7): interferes with normal activities or awakens from sleep    - SEVERE (8-10): excruciating pain, unable to do any normal activities       Now - 9 7. CARDIAC RISK FACTORS: "Do you have any history of heart problems or risk factors for heart disease?" (e.g., angina, prior heart attack; diabetes, high blood pressure, high cholesterol, smoker, or strong family history of heart disease)     CHF 8. PULMONARY RISK FACTORS: "Do you have any history of lung disease?"  (e.g., blood clots in lung, asthma, emphysema, birth control pills)     Asthma 9. CAUSE: "What do you think is  causing the chest pain?"     Unsure 10. OTHER SYMPTOMS: "Do you have any other symptoms?" (e.g., dizziness, nausea, vomiting, sweating, fever, difficulty breathing, cough)       SOB,Headache,nausea, fatigue 11. PREGNANCY: "Is there any chance you are pregnant?" "When was your last menstrual period?"       No  Protocols used: Chest Pain-A-AH

## 2022-04-01 NOTE — Telephone Encounter (Signed)
Spoke with [patient Re: chest pain . Patient voiced she did not go to ER. Pain got better . Patient would like an appointment to follow-up with PCP. Appointment given for 04/07/2022 for 10:10 am with Dr. Wynetta Emery.  Patient advised to call 911 or seek medical attention for chest pain returns. Patient voiced understanding of all discussed .

## 2022-04-07 ENCOUNTER — Ambulatory Visit: Payer: Self-pay | Admitting: Internal Medicine

## 2022-04-08 ENCOUNTER — Encounter: Payer: Self-pay | Admitting: Internal Medicine

## 2022-04-08 ENCOUNTER — Ambulatory Visit: Payer: Self-pay | Attending: Internal Medicine | Admitting: Internal Medicine

## 2022-04-08 ENCOUNTER — Other Ambulatory Visit: Payer: Self-pay

## 2022-04-08 VITALS — BP 161/97 | HR 81 | Temp 98.2°F | Ht 65.0 in | Wt 249.0 lb

## 2022-04-08 DIAGNOSIS — E669 Obesity, unspecified: Secondary | ICD-10-CM

## 2022-04-08 DIAGNOSIS — E1159 Type 2 diabetes mellitus with other circulatory complications: Secondary | ICD-10-CM

## 2022-04-08 DIAGNOSIS — B9789 Other viral agents as the cause of diseases classified elsewhere: Secondary | ICD-10-CM

## 2022-04-08 DIAGNOSIS — R0609 Other forms of dyspnea: Secondary | ICD-10-CM | POA: Diagnosis not present

## 2022-04-08 DIAGNOSIS — E1169 Type 2 diabetes mellitus with other specified complication: Secondary | ICD-10-CM

## 2022-04-08 DIAGNOSIS — F330 Major depressive disorder, recurrent, mild: Secondary | ICD-10-CM

## 2022-04-08 DIAGNOSIS — I152 Hypertension secondary to endocrine disorders: Secondary | ICD-10-CM

## 2022-04-08 DIAGNOSIS — J988 Other specified respiratory disorders: Secondary | ICD-10-CM

## 2022-04-08 DIAGNOSIS — E785 Hyperlipidemia, unspecified: Secondary | ICD-10-CM

## 2022-04-08 DIAGNOSIS — Z23 Encounter for immunization: Secondary | ICD-10-CM

## 2022-04-08 LAB — GLUCOSE, POCT (MANUAL RESULT ENTRY): POC Glucose: 227 mg/dl — AB (ref 70–99)

## 2022-04-08 LAB — POCT GLYCOSYLATED HEMOGLOBIN (HGB A1C): HbA1c, POC (controlled diabetic range): 10.6 % — AB (ref 0.0–7.0)

## 2022-04-08 MED ORDER — INSULIN GLARGINE 100 UNIT/ML SOLOSTAR PEN
10.0000 [IU] | PEN_INJECTOR | Freq: Every day | SUBCUTANEOUS | 3 refills | Status: DC
  Filled 2022-04-08: qty 3, 30d supply, fill #0
  Filled 2022-05-22 – 2022-05-30 (×2): qty 3, 30d supply, fill #1
  Filled 2022-07-04: qty 3, 30d supply, fill #2
  Filled 2022-08-08: qty 3, 30d supply, fill #3

## 2022-04-08 MED ORDER — LISINOPRIL-HYDROCHLOROTHIAZIDE 20-12.5 MG PO TABS
2.0000 | ORAL_TABLET | Freq: Every day | ORAL | 1 refills | Status: DC
  Filled 2022-04-08: qty 60, 30d supply, fill #0
  Filled 2022-05-22 – 2022-05-30 (×3): qty 60, 30d supply, fill #1
  Filled 2022-07-04: qty 60, 30d supply, fill #2
  Filled 2022-08-08: qty 60, 30d supply, fill #3
  Filled 2022-09-18 – 2023-01-06 (×2): qty 60, 30d supply, fill #4
  Filled 2023-02-12: qty 60, 30d supply, fill #5

## 2022-04-08 MED ORDER — TRULICITY 0.75 MG/0.5ML ~~LOC~~ SOAJ
0.7500 mg | SUBCUTANEOUS | 5 refills | Status: DC
  Filled 2022-04-08: qty 2, 28d supply, fill #0
  Filled 2022-05-22 – 2022-05-30 (×2): qty 2, 28d supply, fill #1

## 2022-04-08 MED ORDER — FUROSEMIDE 20 MG PO TABS
20.0000 mg | ORAL_TABLET | Freq: Every day | ORAL | 1 refills | Status: DC | PRN
  Filled 2022-04-08: qty 30, 30d supply, fill #0
  Filled 2022-05-22 – 2022-05-30 (×3): qty 30, 30d supply, fill #1

## 2022-04-08 MED ORDER — FLUOXETINE HCL 10 MG PO CAPS
10.0000 mg | ORAL_CAPSULE | Freq: Every day | ORAL | 3 refills | Status: DC
  Filled 2022-04-08: qty 30, 30d supply, fill #0
  Filled 2022-05-22 (×2): qty 30, 30d supply, fill #1
  Filled 2022-05-23: qty 30, 30d supply, fill #2
  Filled 2022-05-30: qty 30, 30d supply, fill #1
  Filled 2022-08-08: qty 30, 30d supply, fill #2
  Filled 2023-01-06: qty 30, 30d supply, fill #3
  Filled 2023-02-12: qty 30, 30d supply, fill #4

## 2022-04-08 MED ORDER — ALBUTEROL SULFATE HFA 108 (90 BASE) MCG/ACT IN AERS
2.0000 | INHALATION_SPRAY | Freq: Four times a day (QID) | RESPIRATORY_TRACT | 1 refills | Status: DC | PRN
  Filled 2022-04-08: qty 6.7, 25d supply, fill #0

## 2022-04-08 MED ORDER — ATORVASTATIN CALCIUM 20 MG PO TABS
20.0000 mg | ORAL_TABLET | Freq: Every day | ORAL | 3 refills | Status: DC
  Filled 2022-04-08: qty 30, 30d supply, fill #0
  Filled 2022-05-22 – 2022-05-30 (×3): qty 30, 30d supply, fill #1
  Filled 2022-08-08: qty 30, 30d supply, fill #2
  Filled 2022-09-18: qty 30, 30d supply, fill #3

## 2022-04-08 MED ORDER — BENZONATATE 100 MG PO CAPS
100.0000 mg | ORAL_CAPSULE | Freq: Two times a day (BID) | ORAL | 0 refills | Status: DC | PRN
  Filled 2022-04-08: qty 20, 10d supply, fill #0

## 2022-04-08 NOTE — Patient Instructions (Signed)
We have restarted the insulin to take 10 units at bedtime. We have started you on a diabetes medicine called Trulicity 0.7 mg once a week.  Please note that the medication can sometimes cause nausea, vomiting, diarrhea.  It also has the potential to cause pancreatitis and bowel blockage.  If you develop any vomiting or pain in the abdomen, you should stop the medication and be seen.  Healthy Eating Following a healthy eating pattern may help you to achieve and maintain a healthy body weight, reduce the risk of chronic disease, and live a long and productive life. It is important to follow a healthy eating pattern at an appropriate calorie level for your body. Your nutritional needs should be met primarily through food by choosing a variety of nutrient-rich foods. What are tips for following this plan? Reading food labels Read labels and choose the following: Reduced or low sodium. Juices with 100% fruit juice. Foods with low saturated fats and high polyunsaturated and monounsaturated fats. Foods with whole grains, such as whole wheat, cracked wheat, brown rice, and wild rice. Whole grains that are fortified with folic acid. This is recommended for women who are pregnant or who want to become pregnant. Read labels and avoid the following: Foods with a lot of added sugars. These include foods that contain brown sugar, corn sweetener, corn syrup, dextrose, fructose, glucose, high-fructose corn syrup, honey, invert sugar, lactose, malt syrup, maltose, molasses, raw sugar, sucrose, trehalose, or turbinado sugar. Do not eat more than the following amounts of added sugar per day: 6 teaspoons (25 g) for women. 9 teaspoons (38 g) for men. Foods that contain processed or refined starches and grains. Refined grain products, such as white flour, degermed cornmeal, white bread, and white rice. Shopping Choose nutrient-rich snacks, such as vegetables, whole fruits, and nuts. Avoid high-calorie and high-sugar  snacks, such as potato chips, fruit snacks, and candy. Use oil-based dressings and spreads on foods instead of solid fats such as butter, stick margarine, or cream cheese. Limit pre-made sauces, mixes, and "instant" products such as flavored rice, instant noodles, and ready-made pasta. Try more plant-protein sources, such as tofu, tempeh, black beans, edamame, lentils, nuts, and seeds. Explore eating plans such as the Mediterranean diet or vegetarian diet. Cooking Use oil to saut or stir-fry foods instead of solid fats such as butter, stick margarine, or lard. Try baking, boiling, grilling, or broiling instead of frying. Remove the fatty part of meats before cooking. Steam vegetables in water or broth. Meal planning  At meals, imagine dividing your plate into fourths: One-half of your plate is fruits and vegetables. One-fourth of your plate is whole grains. One-fourth of your plate is protein, especially lean meats, poultry, eggs, tofu, beans, or nuts. Include low-fat dairy as part of your daily diet. Lifestyle Choose healthy options in all settings, including home, work, school, restaurants, or stores. Prepare your food safely: Wash your hands after handling raw meats. Keep food preparation surfaces clean by regularly washing with hot, soapy water. Keep raw meats separate from ready-to-eat foods, such as fruits and vegetables. Cook seafood, meat, poultry, and eggs to the recommended internal temperature. Store foods at safe temperatures. In general: Keep cold foods at 8F (4.4C) or below. Keep hot foods at 18F (60C) or above. Keep your freezer at [facility] (-17.8C) or below. Foods are no longer safe to eat when they have been between the temperatures of 40-18F (4.4-60C) for more than 2 hours. What foods should I eat? Fruits Aim to eat  2 cup-equivalents of fresh, canned (in natural juice), or frozen fruits each day. Examples of 1 cup-equivalent of fruit include 1 small apple, 8  large strawberries, 1 cup canned fruit,  cup dried fruit, or 1 cup 100% juice. Vegetables Aim to eat 2-3 cup-equivalents of fresh and frozen vegetables each day, including different varieties and colors. Examples of 1 cup-equivalent of vegetables include 2 medium carrots, 2 cups raw, leafy greens, 1 cup chopped vegetable (raw or cooked), or 1 medium baked potato. Grains Aim to eat 6 ounce-equivalents of whole grains each day. Examples of 1 ounce-equivalent of grains include 1 slice of bread, 1 cup ready-to-eat cereal, 3 cups popcorn, or  cup cooked rice, pasta, or cereal. Meats and other proteins Aim to eat 5-6 ounce-equivalents of protein each day. Examples of 1 ounce-equivalent of protein include 1 egg, 1/2 cup nuts or seeds, or 1 tablespoon (16 g) peanut butter. A cut of meat or fish that is the size of a deck of cards is about 3-4 ounce-equivalents. Of the protein you eat each week, try to have at least 8 ounces come from seafood. This includes salmon, trout, herring, and anchovies. Dairy Aim to eat 3 cup-equivalents of fat-free or low-fat dairy each day. Examples of 1 cup-equivalent of dairy include 1 cup (240 mL) milk, 8 ounces (250 g) yogurt, 1 ounces (44 g) natural cheese, or 1 cup (240 mL) fortified soy milk. Fats and oils Aim for about 5 teaspoons (21 g) per day. Choose monounsaturated fats, such as canola and olive oils, avocados, peanut butter, and most nuts, or polyunsaturated fats, such as sunflower, corn, and soybean oils, walnuts, pine nuts, sesame seeds, sunflower seeds, and flaxseed. Beverages Aim for six 8-oz glasses of water per day. Limit coffee to three to five 8-oz cups per day. Limit caffeinated beverages that have added calories, such as soda and energy drinks. Limit alcohol intake to no more than 1 drink a day for nonpregnant women and 2 drinks a day for men. One drink equals 12 oz of beer (355 mL), 5 oz of wine (148 mL), or 1 oz of hard liquor (44 mL). Seasoning and  other foods Avoid adding excess amounts of salt to your foods. Try flavoring foods with herbs and spices instead of salt. Avoid adding sugar to foods. Try using oil-based dressings, sauces, and spreads instead of solid fats. This information is based on general [place] nutrition guidelines. For more information, visit [employer]. Exact amounts may vary based on your nutrition needs. Summary A healthy eating plan may help you to maintain a healthy weight, reduce the risk of chronic diseases, and stay active throughout your life. Plan your meals. Make sure you eat the right portions of a variety of nutrient-rich foods. Try baking, boiling, grilling, or broiling instead of frying. Choose healthy options in all settings, including home, work, school, restaurants, or stores. This information is not intended to replace advice given to you by your health care provider. Make sure you discuss any questions you have with your health care provider. Document Revised: 08/28/2021 Document Reviewed: 11/13/2020 Elsevier Patient Education  [employer].

## 2022-04-08 NOTE — Progress Notes (Signed)
Patient ID: Julia Dunn, female    DOB: 1970-08-13  MRN: [id number]  CC: Chest Pain (Cough, nausea, vomiting,  loss of appetite, loss of taste/ smell, chest pains X3 weeks/Sensation of fluid on chest, out of breath, weakness, "massive headache", vision issues. Julia Dunn refill.)   Subjective: Julia Dunn is a 52 y.o. female who presents for chronic ds management.  Last seen 12/2020 Her concerns today include:  Pt with hx of HTN, DM 2, obesity, hx abn MMG (12/2020)  Complains of having a cough x 3 weeks.  On 2 occasions cough was so hard that she vomited.  Some loss of taste and smell.  Home COVID test was negative.  Daughter and grandson were sick.  They have been seen and tested negative for COVID. Some shortness of breath with minimal exertion.  No fever. Little lower extremity edema.  Takes furosemide as needed. Some orthopnea.  Sleeps on 2 pillows.   DM: Results for orders placed or performed in visit on 04/08/22  POCT glucose (manual entry)  Result Value Ref Range   POC Glucose 227 (A) 70 - 99 mg/dl  POCT glycosylated hemoglobin (Hb A1C)  Result Value Ref Range   Hemoglobin A1C     HbA1c POC (<> result, manual entry)     HbA1c, POC (prediabetic range)     HbA1c, POC (controlled diabetic range) 10.6 (A) 0.0 - 7.0 %  Was supposed to be on metformin 500 mg twice a day and Lantus 14 units daily.  Out of meds x 1 month.  Took herself off metformin due to GI upset even with the lower dose Eating better, more veggies less red meat.  HTN: Out of medications x 3 months.  Supposed to be on Norvasc 5 mg, lisinopril/HCTZ 20/12.5 mg and furosemide 20 mg daily.  Her friend is on Norvasc and had not been taking his medications so she has been taking some of his Norvasc.  Last took about a week ago.  HL: Out of atorvastatin.  History of depression: Still an issue for her but does well when she takes the Prozac consistently.  HM: Due for flu, shingles and Tdap vaccines.  Due for diabetic eye  exam but is uninsured. Patient Active Problem List   Diagnosis Date Noted   Bilateral hand pain 08/09/2021   Right carpal tunnel syndrome 08/09/2021   Left carpal tunnel syndrome 08/09/2021   Acute on chronic diastolic congestive heart failure ([place]) 08/03/2021   Closed displaced bimalleolar fracture of left ankle 01/04/2016   Abnormal EKG 06/22/2014   Abnormal CT scan of lung 06/22/2014   Dyspnea 06/19/2014   Microscopic hematuria 04/07/2014   Onychomycosis 05/31/2013   BI-RADS category 3 mammogram result 03/07/2013   Vitamin D insufficiency 08/13/2011   Morbid obesity ([place]) 08/10/2007   ALLERGIC RHINITIS, SEASONAL 07/14/2006   DM2 (diabetes mellitus, type 2) 05/28/2006   DEPRESSIVE DISORDER, NOS 05/28/2006   HTN (hypertension) 05/28/2006     Current Outpatient Medications on File Prior to Visit  Medication Sig Dispense Refill   diclofenac Sodium (VOLTAREN) 1 % GEL Apply 4 g topically 4 (four) times daily as needed. 500 g 6   HYDROcodone-acetaminophen (NORCO) 5-325 MG tablet Take 1 tablet by mouth every 6 (six) hours as needed. 20 tablet 0   Insulin Pen Needle (TRUEPLUS PEN NEEDLES) 32G X 4 MM MISC Use to inject insulin daily. 100 each 11   ONE TOUCH ULTRA TEST test strip USE  STRIP TO CHECK GLUCOSE THREE TIMES DAILY BEFORE  MEAL(S) 100 each 5   amLODipine (NORVASC) 5 MG tablet TAKE 1 TABLET (5 MG TOTAL) BY MOUTH DAILY. 90 tablet 3   omeprazole (PRILOSEC) 20 MG capsule TAKE 1 CAPSULE (20 MG TOTAL) BY MOUTH DAILY. 30 capsule 0   No current facility-administered medications on file prior to visit.    No Active Allergies  Social History   Socioeconomic History   Marital status: Significant Other    Spouse name: Not on file   Number of children: 3   Years of education: Not on file   Highest education level: Not on file  Occupational History   Occupation: Unemployed  Tobacco Use   Smoking status: Never   Smokeless tobacco: Never  Substance and Sexual Activity   Alcohol  use: Yes    Comment: social   Drug use: No   Sexual activity: Yes    Birth control/protection: Surgical    Comment: hysterectomy  Other Topics Concern   Not on file  Social History Narrative   Not on file   Social Determinants of Health   Financial Resource Strain: Not on file  Food Insecurity: Not on file  Transportation Needs: Not on file  Physical Activity: Not on file  Stress: Not on file  Social Connections: Not on file  Intimate Partner Violence: Not on file    Family History  Problem Relation Age of Onset   Hypertension Brother    Stroke Brother    Sickle cell trait Daughter    Hypertension Father    Heart disease Mother    Rheumatic fever Mother    Hypertension Mother    Stroke Mother    CAD Mother    Heart disease Paternal Aunt     Past Surgical History:  Procedure Laterality Date   ABDOMINAL HYSTERECTOMY N/A 11/03/2012   Procedure: HYSTERECTOMY ABDOMINAL;  Surgeon: Ena Dawley, MD;  Location: [facility];  Service: Gynecology;  Laterality: N/A;   BILATERAL SALPINGECTOMY Bilateral 11/03/2012   Procedure: BILATERAL SALPINGECTOMY;  Surgeon: Ena Dawley, MD;  Location: [facility];  Service: Gynecology;  Laterality: Bilateral;   BURCH PROCEDURE N/A 11/03/2012   Procedure: BURCH PROCEDURE;  Surgeon: Ena Dawley, MD;  Location: [facility];  Service: Gynecology;  Laterality: N/A;   CARPAL TUNNEL RELEASE Right 11/20/2021   Procedure: RIGHT CARPAL TUNNEL RELEASE;  Surgeon: Leandrew Koyanagi, MD;  Location: [facility];  Service: Orthopedics;  Laterality: Right;   CYSTOSCOPY N/A 11/03/2012   Procedure: CYSTOSCOPY;  Surgeon: Ena Dawley, MD;  Location: [facility];  Service: Gynecology;  Laterality: N/A;   DILATION AND CURETTAGE OF UTERUS     LAPAROSCOPY N/A 11/03/2012   Procedure: LAPAROSCOPY DIAGNOSTIC;  Surgeon: Ena Dawley, MD;  Location: [facility];  Service: Gynecology;  Laterality: N/A;   ORIF ANKLE FRACTURE Left 01/04/2016   Procedure: OPEN REDUCTION INTERNAL  FIXATION (ORIF) LEFT BIMALLEOLAR ANKLE FRACTURE;  Surgeon: Mcarthur Rossetti, MD;  Location: [facility];  Service: Orthopedics;  Laterality: Left;   TUBAL LIGATION     WISDOM TOOTH EXTRACTION     3 teeth    ROS: Review of Systems Negative except as stated above  PHYSICAL EXAM: BP (!) 161/97 (BP Location: Left Arm, Patient Position: Sitting, Cuff Size: Large)   Pulse 81   Temp 98.2 F (36.8 C) (Oral)   Ht '5\' 5"'$  (1.651 m)   Wt 249 lb (112.9 kg)   LMP 06/29/2012   SpO2 97%   BMI 41.44 kg/m   Wt Readings from Last 3 Encounters:  04/08/22 249 lb (112.9 kg)  11/20/21 236 lb 1.8 oz (107.1 kg)  08/06/21 222 lb (100.7 kg)    Physical Exam  General appearance - alert, well appearing, morbidly obese middle-age African-American female and in no distress Mental status - normal mood, behavior, speech, dress, motor activity, and thought processes Mouth - mucous membranes moist, pharynx normal without lesions Neck - supple, no significant adenopathy Chest - clear to auscultation, no wheezes, rales or rhonchi, symmetric air entry Heart - normal rate, regular rhythm, normal S1, S2, no murmurs, rubs, clicks or gallops.  No JVD Extremities - peripheral pulses normal, no pedal edema, no clubbing or cyanosis     04/08/2022   10:38 AM 12/07/2020    2:51 PM 10/20/2019    4:20 PM  Depression screen PHQ 2/9  Decreased Interest '2 1 1  '$ Down, Depressed, Hopeless '2 1 1  '$ PHQ - 2 Score '4 2 2  '$ Altered sleeping 1 0 1  Tired, decreased energy 2 2 0  Change in appetite 2 1 0  Feeling bad or failure about yourself  1 2 0  Trouble concentrating 0 0 0  Moving slowly or fidgety/restless 0 0 0  Suicidal thoughts 0 0 0  PHQ-9 Score '10 7 3      '$ 04/08/2022   10:38 AM 12/07/2020    2:51 PM 10/20/2019    4:21 PM 04/21/2019   11:15 AM  GAD 7 : Generalized Anxiety Score  Nervous, Anxious, on Edge 1 0 0 1  Control/stop worrying '1 2 1 3  '$ Worry too much - different things '1 2 1 3  '$ Trouble relaxing 1 2 0 2   Restless 0 1 0 1  Easily annoyed or irritable 2 2 0 3  Afraid - awful might happen 2 1 0 0  Total GAD 7 Score '8 10 2 13  '$ Anxiety Difficulty    Somewhat difficult        Latest Ref Rng & Units 11/19/2021    3:20 PM 08/03/2021    5:46 AM 12/07/2020    3:46 PM  CMP  Glucose 70 - 99 mg/dL 183  151    BUN 6 - 20 mg/dL 8  16    Creatinine 0.44 - 1.00 mg/dL 0.80  0.84    Sodium 135 - 145 mmol/L 139  141    Potassium 3.5 - 5.1 mmol/L 4.5  4.2    Chloride 98 - 111 mmol/L 106  107    CO2 22 - 32 mmol/L 26  25    Calcium 8.9 - 10.3 mg/dL 8.9  8.9    Total Protein 6.0 - 8.5 g/dL   6.9   Total Bilirubin 0.0 - 1.2 mg/dL   0.5   Alkaline Phos 44 - 121 IU/L   77   AST 0 - 40 IU/L   15   ALT 0 - 32 IU/L   23    Lipid Panel     Component Value Date/Time   CHOL 164 12/07/2020 1546   TRIG 120 12/07/2020 1546   HDL 55 12/07/2020 1546   CHOLHDL 3.0 12/07/2020 1546   CHOLHDL 2.9 08/31/2015 1027   VLDL 23 08/31/2015 1027   LDLCALC 88 12/07/2020 1546    CBC    Component Value Date/Time   WBC 7.8 08/03/2021 0546   RBC 4.90 08/03/2021 0546   HGB 14.3 08/03/2021 0546   HGB 16.0 (H) 10/20/2019 1624   HCT 43.9 08/03/2021 0546   HCT 47.0 (H) 10/20/2019 1624  PLT 168 08/03/2021 0546   PLT 214 10/20/2019 1624   MCV 89.6 08/03/2021 0546   MCV 87 10/20/2019 1624   MCH 29.2 08/03/2021 0546   MCHC 32.6 08/03/2021 0546   RDW 13.1 08/03/2021 0546   RDW 13.0 10/20/2019 1624   LYMPHSABS 3.1 10/20/2019 1624   MONOABS 1.4 (H) 01/01/2014 1734   EOSABS 0.1 10/20/2019 1624   BASOSABS 0.1 10/20/2019 1624    ASSESSMENT AND PLAN:  1. Type 2 diabetes mellitus with obesity (HCC) Not at goal.  Patient has not been very compliant in terms of routine follow-up visits.  Encouraged her to keep routine follow-ups. Stop metformin due to GI intolerance. Continue Lantus insulin but at 10 units nightly instead of 14. Discussed adding another oral agent versus trial with GLIP 1 agent.  Went over with her how  Ozempic and Trulicity works by slowing down the rate at which food is emptied out of the stomach making one feel full a lot quicker.  Went over possible side effects including nausea, vomiting, diarrhea, pancreatitis and bowel blockage.  Advised to stop the medicine if she develops any significant diarrhea or if she develops pain in the upper abdomen or recurrent vomiting.  She expressed understanding.  Clinical pharmacist to meet with the patient today to teach Trulicity administration. - POCT glucose (manual entry) - POCT glycosylated hemoglobin (Hb A1C) - CBC - Comprehensive metabolic panel - Lipid panel - Microalbumin / creatinine urine ratio - Dulaglutide (TRULICITY) 0.99 IP/3.8SN SOPN; Inject 0.75 mg into the skin once a week.  Dispense: 2 mL; Refill: 5 - insulin glargine (LANTUS) 100 UNIT/ML Solostar Pen; Inject 10 Units into the skin at bedtime.  Dispense: 15 mL; Refill: 3  2. Hypertension associated with diabetes ([place]) Not at goal.  She has been off medications for a while.  Refill lisinopril/HCTZ.  Furosemide to take as needed.  I will hold off on restarting amlodipine just yet.  DASH diet encouraged - furosemide (LASIX) 20 MG tablet; Take 1 tablet (20 mg total) by mouth daily as needed.  Dispense: 30 tablet; Refill: 1 - lisinopril-hydrochlorothiazide (ZESTORETIC) 20-12.5 MG tablet; Take 2 tablets by mouth daily.  Dispense: 180 tablet; Refill: 1  3. DOE (dyspnea on exertion) - Brain natriuretic peptide  4. Mild recurrent major depression ([place]) Encouraged to take the Prozac as prescribed especially if she finds it helpful. - FLUoxetine (PROZAC) 10 MG capsule; Take 1 capsule (10 mg total) by mouth daily.  Dispense: 60 capsule; Refill: 3  5. Hyperlipidemia due to type 2 diabetes mellitus (HCC) - atorvastatin (LIPITOR) 20 MG tablet; Take 1 tablet (20 mg total) by mouth daily after supper.  Dispense: 30 tablet; Refill: 3  6. Viral respiratory illness - benzonatate (TESSALON) 100 MG  capsule; Take 1 capsule (100 mg total) by mouth 2 (two) times daily as needed for cough.  Dispense: 20 capsule; Refill: 0 - albuterol (VENTOLIN HFA) 108 (90 Base) MCG/ACT inhaler; Inhale 2 puffs into the lungs every 6 (six) hours as needed for wheezing or shortness of breath.  Dispense: 6.7 g; Refill: 1  7. Need for immunization against influenza - Flu Vaccine QUAD 6moIM (Fluarix, Fluzone & Alfiuria Quad PF)    Patient was given the opportunity to ask questions.  Patient verbalized understanding of the plan and was able to repeat key elements of the plan.   This documentation was completed using DRadio producer  Any transcriptional errors are unintentional.  Orders Placed This Encounter  Procedures   Flu  Vaccine QUAD 80moIM (Fluarix, Fluzone & Alfiuria Quad PF)   Brain natriuretic peptide   CBC   Comprehensive metabolic panel   Lipid panel   Microalbumin / creatinine urine ratio   POCT glucose (manual entry)   POCT glycosylated hemoglobin (Hb A1C)     Requested Prescriptions   Signed Prescriptions Disp Refills   Dulaglutide (TRULICITY) 08.85MOY/7.7AJSOPN 2 mL 5    Sig: Inject 0.75 mg into the skin once a week.   atorvastatin (LIPITOR) 20 MG tablet 30 tablet 3    Sig: Take 1 tablet (20 mg total) by mouth daily after supper.   FLUoxetine (PROZAC) 10 MG capsule 60 capsule 3    Sig: Take 1 capsule (10 mg total) by mouth daily.   furosemide (LASIX) 20 MG tablet 30 tablet 1    Sig: Take 1 tablet (20 mg total) by mouth daily as needed.   insulin glargine (LANTUS) 100 UNIT/ML Solostar Pen 15 mL 3    Sig: Inject 10 Units into the skin at bedtime.   lisinopril-hydrochlorothiazide (ZESTORETIC) 20-12.5 MG tablet 180 tablet 1    Sig: Take 2 tablets by mouth daily.   benzonatate (TESSALON) 100 MG capsule 20 capsule 0    Sig: Take 1 capsule (100 mg total) by mouth 2 (two) times daily as needed for cough.   albuterol (VENTOLIN HFA) 108 (90 Base) MCG/ACT inhaler 6.7 g 1     Sig: Inhale 2 puffs into the lungs every 6 (six) hours as needed for wheezing or shortness of breath.    Return in about 7 weeks (around 05/27/2022).  DKarle Plumber MD, FACP

## 2022-04-09 ENCOUNTER — Other Ambulatory Visit: Payer: Self-pay

## 2022-04-09 LAB — LIPID PANEL
Chol/HDL Ratio: 2.3 ratio (ref 0.0–4.4)
Cholesterol, Total: 116 mg/dL (ref 100–199)
HDL: 51 mg/dL (ref >39)
LDL Chol Calc (NIH): 44 mg/dL (ref 0–99)
Triglycerides: 120 mg/dL (ref 0–149)
VLDL Cholesterol Cal: 21 mg/dL (ref 5–40)

## 2022-04-09 LAB — CBC
Hematocrit: 42.2 % (ref 34.0–46.6)
Hemoglobin: 13.8 g/dL (ref 11.1–15.9)
MCH: 29.2 pg (ref 26.6–33.0)
MCHC: 32.7 g/dL (ref 31.5–35.7)
MCV: 89 fL (ref 79–97)
Platelets: 225 10*3/uL (ref 150–450)
RBC: 4.73 x10E6/uL (ref 3.77–5.28)
RDW: 12.5 % (ref 11.7–15.4)
WBC: 5.8 10*3/uL (ref 3.4–10.8)

## 2022-04-09 LAB — COMPREHENSIVE METABOLIC PANEL
ALT: 43 IU/L — ABNORMAL HIGH (ref 0–32)
AST: 30 IU/L (ref 0–40)
Albumin/Globulin Ratio: 1.8 (ref 1.2–2.2)
Albumin: 3.9 g/dL (ref 3.8–4.9)
Alkaline Phosphatase: 69 IU/L (ref 44–121)
BUN/Creatinine Ratio: 16 (ref 9–23)
BUN: 14 mg/dL (ref 6–24)
Bilirubin Total: 0.6 mg/dL (ref 0.0–1.2)
CO2: 26 mmol/L (ref 20–29)
Calcium: 9.1 mg/dL (ref 8.7–10.2)
Chloride: 103 mmol/L (ref 96–106)
Creatinine, Ser: 0.88 mg/dL (ref 0.57–1.00)
Globulin, Total: 2.2 g/dL (ref 1.5–4.5)
Glucose: 227 mg/dL — ABNORMAL HIGH (ref 70–99)
Potassium: 4.5 mmol/L (ref 3.5–5.2)
Sodium: 142 mmol/L (ref 134–144)
Total Protein: 6.1 g/dL (ref 6.0–8.5)
eGFR: 80 mL/min/{1.73_m2} (ref >59)

## 2022-04-09 LAB — MICROALBUMIN / CREATININE URINE RATIO
Creatinine, Urine: 140.6 mg/dL
Microalb/Creat Ratio: 7 mg/g creat (ref 0–29)
Microalbumin, Urine: 10 ug/mL

## 2022-04-09 LAB — BRAIN NATRIURETIC PEPTIDE: BNP: 111.6 pg/mL — ABNORMAL HIGH (ref 0.0–100.0)

## 2022-05-01 DIAGNOSIS — Z419 Encounter for procedure for purposes other than remedying health state, unspecified: Secondary | ICD-10-CM | POA: Diagnosis not present

## 2022-05-15 ENCOUNTER — Telehealth: Payer: Self-pay

## 2022-05-15 NOTE — Telephone Encounter (Signed)
Patient attempted to be outreached by Junius Finner, PharmD Candidate on 05/15/2022 to discuss hypertension. Left voicemail for patient to return our call at their convenience at [phone].   [place] [facility]  PharmD Candidate 2024   Maryan Puls, PharmD PGY-1 [facility] Pharmacy Resident

## 2022-05-22 ENCOUNTER — Other Ambulatory Visit: Payer: Self-pay

## 2022-05-22 NOTE — Progress Notes (Signed)
Patient outreached by Junius Finner, PharmD Candidate on 05/22/2022 to discuss hypertension.   Patient has an automated home blood pressure machine, both wrist and arm; reports commonly using the wrist monitor. They report home readings 170s/110s; patient usually takes BP right before she leaves for work and feels very anxious while taking her BP. 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 They are not taking medications as prescribed.   The following barriers to adherence were noted:  - They do have cost concerns. She is trying  - They do not have transportation concerns.  - They do need assistance obtaining refills. She is needing refills of all of her medications at this time. She is interested in delivery.  - They do occasionally forget to take some of their prescribed medications.  - They do feel like one/some of their medications make them feel poorly. Increased urination, headache.  - They do not have questions or concerns about their medications.  - They do not have follow up scheduled with their primary care provider/cardiologist.   The following interventions were completed:  - Medications were reviewed  - Patient was educated on goal blood pressures and long term health implications of elevated blood pressure  - Patient was educated on proper technique to check home blood pressure and reminded to bring home machine and readings to next provider appointment  - Patient was educated on medications, including indication and administration  - Patient was educated on use of adherence strategies, like a pill box or alarms   The patient does not have follow up scheduled. Patient would like scheduling to reach out to her about schedule, she prefers the earliest appointment of the day.   Left phone number with patient for any questions or concerns: [phone].   [place] [facility]  PharmD Candidate 2024   Maryan Puls, PharmD PGY-1 [facility] Pharmacy Resident

## 2022-05-23 ENCOUNTER — Other Ambulatory Visit: Payer: Self-pay

## 2022-05-30 ENCOUNTER — Other Ambulatory Visit: Payer: Self-pay

## 2022-05-30 DIAGNOSIS — Z419 Encounter for procedure for purposes other than remedying health state, unspecified: Secondary | ICD-10-CM | POA: Diagnosis not present

## 2022-06-02 ENCOUNTER — Other Ambulatory Visit: Payer: Self-pay

## 2022-06-03 ENCOUNTER — Other Ambulatory Visit: Payer: Self-pay

## 2022-06-06 ENCOUNTER — Other Ambulatory Visit: Payer: Self-pay

## 2022-06-13 ENCOUNTER — Ambulatory Visit: Payer: Medicaid Other | Attending: Internal Medicine | Admitting: Internal Medicine

## 2022-06-13 ENCOUNTER — Other Ambulatory Visit: Payer: Self-pay

## 2022-06-13 ENCOUNTER — Encounter: Payer: Self-pay | Admitting: Internal Medicine

## 2022-06-13 DIAGNOSIS — Z87898 Personal history of other specified conditions: Secondary | ICD-10-CM

## 2022-06-13 DIAGNOSIS — Z1211 Encounter for screening for malignant neoplasm of colon: Secondary | ICD-10-CM | POA: Insufficient documentation

## 2022-06-13 DIAGNOSIS — Z6841 Body Mass Index (BMI) 40.0 and over, adult: Secondary | ICD-10-CM | POA: Insufficient documentation

## 2022-06-13 DIAGNOSIS — R928 Other abnormal and inconclusive findings on diagnostic imaging of breast: Secondary | ICD-10-CM | POA: Insufficient documentation

## 2022-06-13 DIAGNOSIS — Z8249 Family history of ischemic heart disease and other diseases of the circulatory system: Secondary | ICD-10-CM | POA: Diagnosis not present

## 2022-06-13 DIAGNOSIS — I152 Hypertension secondary to endocrine disorders: Secondary | ICD-10-CM

## 2022-06-13 DIAGNOSIS — R079 Chest pain, unspecified: Secondary | ICD-10-CM | POA: Diagnosis not present

## 2022-06-13 DIAGNOSIS — R0981 Nasal congestion: Secondary | ICD-10-CM | POA: Insufficient documentation

## 2022-06-13 DIAGNOSIS — R002 Palpitations: Secondary | ICD-10-CM | POA: Insufficient documentation

## 2022-06-13 DIAGNOSIS — Z23 Encounter for immunization: Secondary | ICD-10-CM | POA: Diagnosis not present

## 2022-06-13 DIAGNOSIS — Z7985 Long-term (current) use of injectable non-insulin antidiabetic drugs: Secondary | ICD-10-CM | POA: Diagnosis not present

## 2022-06-13 DIAGNOSIS — E1169 Type 2 diabetes mellitus with other specified complication: Secondary | ICD-10-CM

## 2022-06-13 DIAGNOSIS — E1159 Type 2 diabetes mellitus with other circulatory complications: Secondary | ICD-10-CM

## 2022-06-13 DIAGNOSIS — I110 Hypertensive heart disease with heart failure: Secondary | ICD-10-CM | POA: Insufficient documentation

## 2022-06-13 DIAGNOSIS — R0609 Other forms of dyspnea: Secondary | ICD-10-CM | POA: Diagnosis not present

## 2022-06-13 DIAGNOSIS — E119 Type 2 diabetes mellitus without complications: Secondary | ICD-10-CM | POA: Insufficient documentation

## 2022-06-13 DIAGNOSIS — F331 Major depressive disorder, recurrent, moderate: Secondary | ICD-10-CM | POA: Diagnosis not present

## 2022-06-13 MED ORDER — TRULICITY 1.5 MG/0.5ML ~~LOC~~ SOAJ
1.5000 mg | SUBCUTANEOUS | 4 refills | Status: DC
  Filled 2022-06-13 – 2022-07-02 (×3): qty 2, 28d supply, fill #0
  Filled 2022-08-08: qty 2, 28d supply, fill #1
  Filled 2022-09-18: qty 2, 28d supply, fill #2
  Filled 2022-11-18: qty 2, 28d supply, fill #3
  Filled 2023-01-06: qty 2, 28d supply, fill #4

## 2022-06-13 MED ORDER — AMLODIPINE BESYLATE 5 MG PO TABS
5.0000 mg | ORAL_TABLET | Freq: Every day | ORAL | 3 refills | Status: DC
  Filled 2022-06-13: qty 90, 90d supply, fill #0
  Filled 2022-07-02: qty 30, 30d supply, fill #0
  Filled 2022-08-08: qty 30, 30d supply, fill #1

## 2022-06-13 NOTE — Progress Notes (Signed)
Patient ID: Julia Dunn, female    DOB: 1970-04-21  MRN: [id number]  CC: Diabetes (DM f/u. Kinnie Feil referral to endo and ENT. Neoma Laming received flu vax.)   Subjective: Julia Dunn is a 52 y.o. female who presents for 2 mth f/u Her concerns today include:  Pt with hx of HTN, DM 2, obesity, hx abn MMG (12/2020)   DM:  Lab Results  Component Value Date   HGBA1C 10.6 (A) 04/08/2022  On last visit, patient was started on Trulicity and continued on Lantus insulin 10 units.  Compliant with meds.  Taking and tolerating Trulicity.  Has 2 pens left. Decrease appetite the first few days after she takes it.  Down 5 lbs since last visit 2 mths ago Cleans houses for a living but would like to get in some exercise outside of work but no time.  Works 7 days a wk.  Also having to take care of her mom.    -has Libre 2 CGM, belongs to mom but she refuses to wear it so pt took it.  Left reader in car today.  Reports BS have been staying b/w 130-215.  She stopped eating chocolate.   HTN: Should be on lisinopril 20/12.5 mg 2 tabs daily and furosemide 20 mg as needed.  Out Norvasc x 2 wks Took med already today. Checks BP QOD.  Reports readings have been high 160-170s/100 Endorses LE edema by end of day.  DOE especially when taking stairs.  BNP on last visit 111.6, echo 2021 with low nl EF 50-55%.  Palpitations daily. Endorses LT sided CP at times when performing her work duties.  Sometimes has to sit for a few mins  Request referral to ENT.  No smell.  Has deviated septum.  Feels pressure at base of nostrils.  Uses Netti Pot  Still taking Prozac for depression.  Not sure that it is helping.  She feels she would benefit from some counseling.  HM: Overdue for mammogram.  Has history of abnormal mammogram.  She wants to hold off on doing the mammogram.  She feels something is wrong but afraid to go back.  Due for Shingrix vaccine series.  Agreeable to receiving first shot today.  Due for colon cancer  screening.  No family history of colon cancer.  Prefers to do Cologuard.  Patient Active Problem List   Diagnosis Date Noted   Depression, major, recurrent, moderate ([place]) 06/13/2022   Bilateral hand pain 08/09/2021   Right carpal tunnel syndrome 08/09/2021   Left carpal tunnel syndrome 08/09/2021   Acute on chronic diastolic congestive heart failure ([place]) 08/03/2021   Closed displaced bimalleolar fracture of left ankle 01/04/2016   Abnormal EKG 06/22/2014   Abnormal CT scan of lung 06/22/2014   Dyspnea 06/19/2014   Microscopic hematuria 04/07/2014   Onychomycosis 05/31/2013   BI-RADS category 3 mammogram result 03/07/2013   Vitamin D insufficiency 08/13/2011   Morbid obesity ([place]) 08/10/2007   ALLERGIC RHINITIS, SEASONAL 07/14/2006   DM2 (diabetes mellitus, type 2) 05/28/2006   DEPRESSIVE DISORDER, NOS 05/28/2006   HTN (hypertension) 05/28/2006     Current Outpatient Medications on File Prior to Visit  Medication Sig Dispense Refill   albuterol (VENTOLIN HFA) 108 (90 Base) MCG/ACT inhaler Inhale 2 puffs into the lungs every 6 (six) hours as needed for wheezing or shortness of breath. 6.7 g 1   atorvastatin (LIPITOR) 20 MG tablet Take 1 tablet (20 mg total) by mouth daily after supper. 30 tablet 3   diclofenac  Sodium (VOLTAREN) 1 % GEL Apply 4 g topically 4 (four) times daily as needed. 500 g 6   FLUoxetine (PROZAC) 10 MG capsule Take 1 capsule (10 mg total) by mouth daily. 60 capsule 3   furosemide (LASIX) 20 MG tablet Take 1 tablet (20 mg total) by mouth daily as needed. 30 tablet 1   HYDROcodone-acetaminophen (NORCO) 5-325 MG tablet Take 1 tablet by mouth every 6 (six) hours as needed. 20 tablet 0   insulin glargine (LANTUS) 100 UNIT/ML Solostar Pen Inject 10 Units into the skin at bedtime. 15 mL 3   Insulin Pen Needle (TRUEPLUS PEN NEEDLES) 32G X 4 MM MISC Use to inject insulin daily. 100 each 11   lisinopril-hydrochlorothiazide (ZESTORETIC) 20-12.5 MG tablet Take 2 tablets by  mouth daily. 180 tablet 1   ONE TOUCH ULTRA TEST test strip USE  STRIP TO CHECK GLUCOSE THREE TIMES DAILY BEFORE MEAL(S) 100 each 5   omeprazole (PRILOSEC) 20 MG capsule TAKE 1 CAPSULE (20 MG TOTAL) BY MOUTH DAILY. 30 capsule 0   No current facility-administered medications on file prior to visit.    No Active Allergies  Social History   Socioeconomic History   Marital status: Significant Other    Spouse name: Not on file   Number of children: 3   Years of education: Not on file   Highest education level: Not on file  Occupational History   Occupation: Unemployed  Tobacco Use   Smoking status: Never   Smokeless tobacco: Never  Substance and Sexual Activity   Alcohol use: Yes    Comment: social   Drug use: No   Sexual activity: Yes    Birth control/protection: Surgical    Comment: hysterectomy  Other Topics Concern   Not on file  Social History Narrative   Not on file   Social Determinants of Health   Financial Resource Strain: Not on file  Food Insecurity: Not on file  Transportation Needs: Not on file  Physical Activity: Not on file  Stress: Not on file  Social Connections: Not on file  Intimate Partner Violence: Not on file    Family History  Problem Relation Age of Onset   Hypertension Brother    Stroke Brother    Sickle cell trait Daughter    Hypertension Father    Heart disease Mother    Rheumatic fever Mother    Hypertension Mother    Stroke Mother    CAD Mother    Heart disease Paternal Aunt     Past Surgical History:  Procedure Laterality Date   ABDOMINAL HYSTERECTOMY N/A 11/03/2012   Procedure: HYSTERECTOMY ABDOMINAL;  Surgeon: Ena Dawley, MD;  Location: [facility];  Service: Gynecology;  Laterality: N/A;   BILATERAL SALPINGECTOMY Bilateral 11/03/2012   Procedure: BILATERAL SALPINGECTOMY;  Surgeon: Ena Dawley, MD;  Location: [facility];  Service: Gynecology;  Laterality: Bilateral;   BURCH PROCEDURE N/A 11/03/2012   Procedure: BURCH PROCEDURE;   Surgeon: Ena Dawley, MD;  Location: [facility];  Service: Gynecology;  Laterality: N/A;   CARPAL TUNNEL RELEASE Right 11/20/2021   Procedure: RIGHT CARPAL TUNNEL RELEASE;  Surgeon: Leandrew Koyanagi, MD;  Location: [facility];  Service: Orthopedics;  Laterality: Right;   CYSTOSCOPY N/A 11/03/2012   Procedure: CYSTOSCOPY;  Surgeon: Ena Dawley, MD;  Location: [facility];  Service: Gynecology;  Laterality: N/A;   DILATION AND CURETTAGE OF UTERUS     LAPAROSCOPY N/A 11/03/2012   Procedure: LAPAROSCOPY DIAGNOSTIC;  Surgeon: Ena Dawley, MD;  Location:  [facility];  Service: Gynecology;  Laterality: N/A;   ORIF ANKLE FRACTURE Left 01/04/2016   Procedure: OPEN REDUCTION INTERNAL FIXATION (ORIF) LEFT BIMALLEOLAR ANKLE FRACTURE;  Surgeon: Mcarthur Rossetti, MD;  Location: [facility];  Service: Orthopedics;  Laterality: Left;   TUBAL LIGATION     WISDOM TOOTH EXTRACTION     3 teeth    ROS: Review of Systems Negative except as stated above  PHYSICAL EXAM: BP (!) 172/122 (BP Location: Left Arm, Patient Position: Sitting, Cuff Size: Large)   Pulse 78   Temp 98.1 F (36.7 C) (Oral)   Ht 5\' 5"  (1.651 m)   Wt 244 lb (110.7 kg)   LMP 06/29/2012   SpO2 99%   BMI 40.60 kg/m   Wt Readings from Last 3 Encounters:  06/13/22 244 lb (110.7 kg)  04/08/22 249 lb (112.9 kg)  11/20/21 236 lb 1.8 oz (107.1 kg)   BP 180/121 Physical Exam  General appearance - alert, well appearing, middle-aged African-American female and in no distress Mental status -patient is tearful at times especially when I spoke with her about getting follow-up mammogram done.   Neck - supple, no significant adenopathy Chest - clear to auscultation, no wheezes, rales or rhonchi, symmetric air entry Heart - normal rate, regular rhythm, normal S1, S2, no murmurs, rubs, clicks or gallops Extremities -trace lower extremity edema      Latest Ref Rng & Units 04/08/2022   11:05 AM 11/19/2021    3:20 PM 08/03/2021    5:46 AM   CMP  Glucose 70 - 99 mg/dL 227  183  151   BUN 6 - 24 mg/dL 14  8  16    Creatinine 0.57 - 1.00 mg/dL 0.88  0.80  0.84   Sodium 134 - 144 mmol/L 142  139  141   Potassium 3.5 - 5.2 mmol/L 4.5  4.5  4.2   Chloride 96 - 106 mmol/L 103  106  107   CO2 20 - 29 mmol/L 26  26  25    Calcium 8.7 - 10.2 mg/dL 9.1  8.9  8.9   Total Protein 6.0 - 8.5 g/dL 6.1     Total Bilirubin 0.0 - 1.2 mg/dL 0.6     Alkaline Phos 44 - 121 IU/L 69     AST 0 - 40 IU/L 30     ALT 0 - 32 IU/L 43      Lipid Panel     Component Value Date/Time   CHOL 116 04/08/2022 1105   TRIG 120 04/08/2022 1105   HDL 51 04/08/2022 1105   CHOLHDL 2.3 04/08/2022 1105   CHOLHDL 2.9 08/31/2015 1027   VLDL 23 08/31/2015 1027   LDLCALC 44 04/08/2022 1105    CBC    Component Value Date/Time   WBC 5.8 04/08/2022 1105   WBC 7.8 08/03/2021 0546   RBC 4.73 04/08/2022 1105   RBC 4.90 08/03/2021 0546   HGB 13.8 04/08/2022 1105   HCT 42.2 04/08/2022 1105   PLT 225 04/08/2022 1105   MCV 89 04/08/2022 1105   MCH 29.2 04/08/2022 1105   MCH 29.2 08/03/2021 0546   MCHC 32.7 04/08/2022 1105   MCHC 32.6 08/03/2021 0546   RDW 12.5 04/08/2022 1105   LYMPHSABS 3.1 10/20/2019 1624   MONOABS 1.4 (H) 01/01/2014 1734   EOSABS 0.1 10/20/2019 1624   BASOSABS 0.1 10/20/2019 1624   EKG: Sinus rhythm with occasional PVC.  QT prolonged.  EKG is unchanged from previous.  ASSESSMENT AND PLAN:  1. Type 2 diabetes mellitus with morbid obesity ([place]) Patient reports blood sugars are improving but not quite at goal as yet. Encouraged her to try to eat as healthy as she can.  Continue Lantus insulin 10 units daily.  She is tolerating Trulicity and is willing to have the dose increased to the 1.5 mg/week.  Hopefully she would have further weight loss with the increased dose.  Continue to stay active.  Even though she is not able to do formal exercise, she does move quite a bit with the type of job that she does. - Ambulatory referral to  Ophthalmology - Ambulatory referral to Endocrinology - Dulaglutide (TRULICITY) 1.5 0000000 SOPN; Inject 1.5 mg into the skin once a week.  Dispense: 2 mL; Refill: 4  2. Hypertension associated with diabetes ([place]) Not at goal.  Refill given on Norvasc.  She will continue lisinopril/HCTZ 20/12.5 mg 2 tablets daily. - amLODipine (NORVASC) 5 MG tablet; TAKE 1 TABLET (5 MG TOTAL) BY MOUTH DAILY.  Dispense: 90 tablet; Refill: 3  3. DOE (dyspnea on exertion) Differential diagnosis include deconditioning versus any underlying heart disease. - Ambulatory referral to Cardiology  4. Chest pain in adult - Ambulatory referral to Cardiology - EKG 12-Lead  5. Palpitations - Ambulatory referral to Cardiology - [id number] - Basic Metabolic Panel - EKG [id number]  6. Depression, major, recurrent, moderate ([facility]) Continue Prozac.  Referral submitted to behavioral health - Ambulatory referral to Psychiatry  7. Screening for colon cancer - Cologuard  8. History of abnormal mammogram Strongly encouraged her to get the mammogram done.  Advised that the whole purpose of screening is for early detection.  Patient still declined.  9. Sinus congestion Refer to ENT  10. Need for shingles vaccine Given first Shingrix vaccine today.  Informed that it can cause some soreness at the injection site.     Patient was given the opportunity to ask questions.  Patient verbalized understanding of the plan and was able to repeat key elements of the plan.   This documentation was completed using Radio producer.  Any transcriptional errors are unintentional.  Orders Placed This Encounter  Procedures   Varicella-zoster vaccine IM   Cologuard   [id number]   Basic Metabolic Panel   Ambulatory referral to Ophthalmology   Ambulatory referral to Endocrinology   Ambulatory referral to Cardiology   Ambulatory referral to Psychiatry   EKG 12-Lead     Requested Prescriptions    Signed Prescriptions Disp Refills   Dulaglutide (TRULICITY) 1.5 0000000 SOPN 2 mL 4    Sig: Inject 1.5 mg into the skin once a week.   amLODipine (NORVASC) 5 MG tablet 90 tablet 3    Sig: TAKE 1 TABLET (5 MG TOTAL) BY MOUTH DAILY.    Return in about 4 months (around 10/13/2022) for Appt with [facility] in 2 wks for BP check.  Karle Plumber, MD, FACP

## 2022-06-13 NOTE — Patient Instructions (Signed)
I have sent refill on Norvasc to the pharmacy for you. We have increase Trulicity to 1.5 mg daily.  If the pharmacy does not have Trulicity, we will change to Ozempic.  Please let me know when you are ready to have mammogram done.

## 2022-06-14 LAB — BASIC METABOLIC PANEL
BUN/Creatinine Ratio: 13 (ref 9–23)
BUN: 11 mg/dL (ref 6–24)
CO2: 25 mmol/L (ref 20–29)
Calcium: 9.6 mg/dL (ref 8.7–10.2)
Chloride: 104 mmol/L (ref 96–106)
Creatinine, Ser: 0.85 mg/dL (ref 0.57–1.00)
Glucose: 131 mg/dL — ABNORMAL HIGH (ref 70–99)
Potassium: 3.9 mmol/L (ref 3.5–5.2)
Sodium: 144 mmol/L (ref 134–144)
eGFR: 83 mL/min/{1.73_m2} (ref >59)

## 2022-06-14 LAB — TSH+T4F+T3FREE
Free T4: 1.14 ng/dL (ref 0.82–1.77)
T3, Free: 3.2 pg/mL (ref 2.0–4.4)
TSH: 0.752 u[IU]/mL (ref 0.450–4.500)

## 2022-06-16 ENCOUNTER — Other Ambulatory Visit: Payer: Self-pay

## 2022-06-20 ENCOUNTER — Other Ambulatory Visit: Payer: Self-pay

## 2022-06-20 NOTE — Progress Notes (Deleted)
Cardiology Office Note:    Date:  06/20/2022   ID:  Julia Dunn, DOB [birth year], MRN [id number]  PCP:  Ladell Pier, MD  [facility] Cardiologist:  Lauree Chandler, MD { Click to update primary MD,subspecialty MD or APP then REFRESH:1}  *** Referring MD: Ladell Pier, MD   Chief Complaint:  No chief complaint on file. {Click here for Visit Info    :1}    History of Present Illness:   Julia Dunn is a 52 y.o. female with  a hx of asthma, depression, diabetes mellitus, GERD, chronic diastolic CHF,  morbid obesity and HTN.  Echo 11/24/19 with LVEF=50-55%. Moderate LVH. No significant valve disease. Coronary CTA 11/29/19 with no evidence of CAD. Calcium score zero     Patient was last seen in our office 07/2021 after ED visit for SOB, LE edema and chest pain. BNP 256 given IV lasix, albuteral, duoneb and signed out AMA. At [facility] she wasn't taking her meds as Rx and BP high.      Past Medical History:  Diagnosis Date   Anemia Dx 2013   Asthma    Bacterial vaginosis [id number]   Complication of anesthesia    pt states was informed had difficult time awakening following hysterectomy    Depression    Depression    Diabetes mellitus Dx 2005   Fibroids 09/05/2011   08/08/2011: Anteverted uterus. Diffuse fibroid involvement. Left of midline fibroid with submucosal component. 3.4cm x 2.8cmx 3.4cm.    GERD (gastroesophageal reflux disease)    Gestational diabetes    H/O shortness of breath    pt states has improved since lossing weight; triggers currently are enviromental pt states can make it to top of stairs w/o having to stop to catch her breath   Headache(784.0)    migraines   History of blood transfusion 2013/2014   History of bronchitis    Hx of blood clots    Hypertension Dx 2015   Kidney infection 2015   Menorrhagia 09/27/2010   Pneumonia    Postpartum depression    Pregnancy induced hypertension    Sleep apnea    mild, no CPAP recommended    Trichomonas    Current Medications: No outpatient medications have been marked as taking for the 07/02/22 encounter (Appointment) with Imogene Burn, PA-C.    Allergies:   Patient has no active allergies.   Social History   Tobacco Use   Smoking status: Never   Smokeless tobacco: Never  Substance Use Topics   Alcohol use: Yes    Comment: social   Drug use: No    Family Hx: The patient's family history includes CAD in her mother; Heart disease in her mother and paternal aunt; Hypertension in her brother, father, and mother; Rheumatic fever in her mother; Sickle cell trait in her daughter; Stroke in her brother and mother.  ROS     Physical Exam:    VS:  LMP 06/29/2012     Wt Readings from Last 3 Encounters:  06/13/22 244 lb (110.7 kg)  04/08/22 249 lb (112.9 kg)  11/20/21 236 lb 1.8 oz (107.1 kg)    Physical Exam  GEN: Well nourished, well developed, in no acute distress  HEENT: normal  Neck: no JVD, carotid bruits, or masses Cardiac:RRR; no murmurs, rubs, or gallops  Respiratory:  clear to auscultation bilaterally, normal work of breathing GI: soft, nontender, nondistended, + BS Ext: without cyanosis, clubbing, or edema, Good distal pulses bilaterally MS: no  deformity or atrophy  Skin: warm and dry, no rash Neuro:  Alert and Oriented x 3, Strength and sensation are intact Psych: euthymic mood, full affect        EKGs/Labs/Other Test Reviewed:    EKG:  EKG is *** ordered today.  The ekg ordered today demonstrates ***  Recent Labs: 04/08/2022: ALT 43; BNP 111.6; Hemoglobin 13.8; Platelets 225 06/13/2022: BUN 11; Creatinine, Ser 0.85; Potassium 3.9; Sodium 144; TSH 0.752   Recent Lipid Panel Recent Labs    04/08/22 1105  CHOL 116  TRIG 120  HDL 51  LDLCALC 44     Prior CV Studies: {Select studies to display:26339}  Coronary CT 10/2019 IMPRESSION: 1. Coronary artery calcium score 0 Agatston units. This suggests low risk for future cardiac events.    2.  No significant coronary disease was seen.   Loralie Champagne     Echo 11/24/19 1. Left ventricular ejection fraction, by estimation, is 50 to 55%. The  left ventricle has low normal function. The left ventricle has no regional  wall motion abnormalities. There is moderate asymmetric left ventricular  hypertrophy of the posterior  segment. Left ventricular diastolic parameters are consistent with Grade I  diastolic dysfunction (impaired relaxation). Elevated left ventricular  end-diastolic pressure. The average left ventricular global longitudinal  strain is -16.9 %. The global  longitudinal strain is normal.   2. Right ventricular systolic function is normal. The right ventricular  size is normal.   3. The mitral valve is normal in structure. No evidence of mitral valve  regurgitation. No evidence of mitral stenosis.   4. The aortic valve is normal in structure. Aortic valve regurgitation is  trivial. No aortic stenosis is present.   5. The inferior vena cava is normal in size with greater than 50%  respiratory variability, suggesting right atrial pressure of 3 mmHg.      Risk Assessment/Calculations/Metrics:   {Does this patient have ATRIAL FIBRILLATION?:[id number]}     No BP recorded.  {Refresh Note OR Click here to enter BP  :1}***    ASSESSMENT & PLAN:   No problem-specific Assessment & Plan notes found for this encounter.   Chronic diastolic CHF  HTN  Morbid obesity  Noncompliance.        {Are you ordering a CV Procedure (e.g. stress test, cath, DCCV, TEE, etc)?   Press F2        :[id number]   Dispo:  No follow-ups on file.   Medication Adjustments/Labs and Tests Ordered: Current medicines are reviewed at length with the patient today.  Concerns regarding medicines are outlined above.  Tests Ordered: No orders of the defined types were placed in this encounter.  Medication Changes: No orders of the defined types were placed in this  encounter.  Sumner Boast, PA-C  06/20/2022 8:29 AM    [facility] [address], [place], [place]  [address] Phone: [phone]; Fax: [phone]

## 2022-06-23 ENCOUNTER — Other Ambulatory Visit: Payer: Self-pay

## 2022-06-27 ENCOUNTER — Encounter: Payer: Self-pay | Admitting: Internal Medicine

## 2022-06-27 NOTE — Progress Notes (Signed)
Patient has endocrine appointment scheduled with Dr. Francetta Found with [facility] 08/26/2022 at 1 PM.

## 2022-06-30 DIAGNOSIS — Z419 Encounter for procedure for purposes other than remedying health state, unspecified: Secondary | ICD-10-CM | POA: Diagnosis not present

## 2022-07-02 ENCOUNTER — Other Ambulatory Visit: Payer: Self-pay

## 2022-07-02 ENCOUNTER — Ambulatory Visit: Payer: Medicaid Other | Admitting: Physician Assistant

## 2022-07-03 ENCOUNTER — Other Ambulatory Visit: Payer: Self-pay

## 2022-07-04 ENCOUNTER — Other Ambulatory Visit: Payer: Self-pay

## 2022-07-11 ENCOUNTER — Ambulatory Visit: Payer: Medicaid Other | Admitting: Pharmacist

## 2022-07-16 DIAGNOSIS — Z1211 Encounter for screening for malignant neoplasm of colon: Secondary | ICD-10-CM | POA: Diagnosis not present

## 2022-07-23 LAB — COLOGUARD: COLOGUARD: NEGATIVE

## 2022-07-28 ENCOUNTER — Ambulatory Visit: Payer: Medicaid Other | Admitting: Pharmacist

## 2022-07-30 DIAGNOSIS — Z419 Encounter for procedure for purposes other than remedying health state, unspecified: Secondary | ICD-10-CM | POA: Diagnosis not present

## 2022-08-08 ENCOUNTER — Other Ambulatory Visit: Payer: Self-pay

## 2022-08-13 NOTE — Progress Notes (Deleted)
Cardiology Office Note:    Date:  08/13/2022   ID:  Julia Dunn, DOB 14-Apr-1970, MRN [id number]  PCP:  Marcine Matar, MD  [facility] Providers Cardiologist:  Verne Carrow, MD { Click to update primary MD,subspecialty MD or APP then REFRESH:1}  *** Referring MD: Marcine Matar, MD   Chief Complaint:  No chief complaint on file. {Click here for Visit Info    :1}    History of Present Illness:   Julia Dunn is a 52 y.o. female with with  a hx of asthma, depression, diabetes mellitus, GERD, chronic diastolic CHF,  morbid obesity and HTN.  Echo 11/24/19 with LVEF=50-55%. Moderate LVH. No significant valve disease. Coronary CTA 11/29/19 with no evidence of CAD. Calcium score zero     Patient was last seen in our office 07/2021 after ED visit for SOB, LE edema and chest pain. BNP 256 given IV lasix, albuteral, duoneb and signed out AMA. At [facility] she wasn't taking her meds as Rx and BP high.          Past Medical History:  Diagnosis Date   Anemia Dx 2013   Asthma    Bacterial vaginosis 2008   Complication of anesthesia    pt states was informed had difficult time awakening following hysterectomy    Depression    Depression    Diabetes mellitus Dx 2005   Fibroids 09/05/2011   08/08/2011: Anteverted uterus. Diffuse fibroid involvement. Left of midline fibroid with submucosal component. 3.4cm x 2.8cmx 3.4cm.    GERD (gastroesophageal reflux disease)    Gestational diabetes    H/O shortness of breath    pt states has improved since lossing weight; triggers currently are enviromental pt states can make it to top of stairs w/o having to stop to catch her breath   Headache(784.0)    migraines   History of blood transfusion 2013/2014   History of bronchitis    Hx of blood clots    Hypertension Dx 2015   Kidney infection 2015   Menorrhagia 09/27/2010   Pneumonia    Postpartum depression    Pregnancy induced hypertension    Sleep apnea    mild, no CPAP  recommended   Trichomonas    Current Medications: No outpatient medications have been marked as taking for the 08/26/22 encounter (Appointment) with Dyann Kief, PA-C.    Allergies:   Patient has no active allergies.   Social History   Tobacco Use   Smoking status: Never   Smokeless tobacco: Never  Substance Use Topics   Alcohol use: Yes    Comment: social   Drug use: No    Family Hx: The patient's family history includes CAD in her mother; Heart disease in her mother and paternal aunt; Hypertension in her brother, father, and mother; Rheumatic fever in her mother; Sickle cell trait in her daughter; Stroke in her brother and mother.  ROS     Physical Exam:    VS:  LMP 06/29/2012     Wt Readings from Last 3 Encounters:  06/13/22 244 lb (110.7 kg)  04/08/22 249 lb (112.9 kg)  11/20/21 236 lb 1.8 oz (107.1 kg)    Physical Exam  GEN: Well nourished, well developed, in no acute distress  HEENT: normal  Neck: no JVD, carotid bruits, or masses Cardiac:RRR; no murmurs, rubs, or gallops  Respiratory:  clear to auscultation bilaterally, normal work of breathing GI: soft, nontender, nondistended, + BS Ext: without cyanosis, clubbing, or edema, Good  distal pulses bilaterally MS: no deformity or atrophy  Skin: warm and dry, no rash Neuro:  Alert and Oriented x 3, Strength and sensation are intact Psych: euthymic mood, full affect        EKGs/Labs/Other Test Reviewed:    EKG:  EKG is *** ordered today.  The ekg ordered today demonstrates ***  Recent Labs: 04/08/2022: ALT 43; BNP 111.6; Hemoglobin 13.8; Platelets 225 06/13/2022: BUN 11; Creatinine, Ser 0.85; Potassium 3.9; Sodium 144; TSH 0.752   Recent Lipid Panel Recent Labs    04/08/22 1105  CHOL 116  TRIG 120  HDL 51  LDLCALC 44     Prior CV Studies: {Select studies to display:26339}  Coronary CT 10/2019 IMPRESSION: 1. Coronary artery calcium score 0 Agatston units. This suggests low risk for future  cardiac events.   2.  No significant coronary disease was seen.   Marca Ancona     Echo 11/24/19 1. Left ventricular ejection fraction, by estimation, is 50 to 55%. The  left ventricle has low normal function. The left ventricle has no regional  wall motion abnormalities. There is moderate asymmetric left ventricular  hypertrophy of the posterior  segment. Left ventricular diastolic parameters are consistent with Grade I  diastolic dysfunction (impaired relaxation). Elevated left ventricular  end-diastolic pressure. The average left ventricular global longitudinal  strain is -16.9 %. The global  longitudinal strain is normal.   2. Right ventricular systolic function is normal. The right ventricular  size is normal.   3. The mitral valve is normal in structure. No evidence of mitral valve  regurgitation. No evidence of mitral stenosis.   4. The aortic valve is normal in structure. Aortic valve regurgitation is  trivial. No aortic stenosis is present.   5. The inferior vena cava is normal in size with greater than 50%  respiratory variability, suggesting right atrial pressure of 3 mmHg.      Risk Assessment/Calculations/Metrics:   {Does this patient have ATRIAL FIBRILLATION?:[phone]}     No BP recorded.  {Refresh Note OR Click here to enter BP  :1}***    ASSESSMENT & PLAN:   No problem-specific Assessment & Plan notes found for this encounter.   Chronic diastolic CHF  HTN  Morbid obesity  Noncompliance.        {Are you ordering a CV Procedure (e.g. stress test, cath, DCCV, TEE, etc)?   Press F2        :[id number]}   Dispo:  No follow-ups on file.   Medication Adjustments/Labs and Tests Ordered: Current medicines are reviewed at length with the patient today.  Concerns regarding medicines are outlined above.  Tests Ordered: No orders of the defined types were placed in this encounter.  Medication Changes: No orders of the defined types were placed in this  encounter.  Signed, Jacolyn Reedy, PA-C  08/13/2022 8:16 AM    [facility] [address] [place], [place], [place]  [address] Phone: [phone]; Fax: [phone]

## 2022-08-19 ENCOUNTER — Ambulatory Visit: Payer: Medicaid Other | Admitting: Physician Assistant

## 2022-08-26 ENCOUNTER — Ambulatory Visit: Payer: Medicaid Other | Attending: Physician Assistant | Admitting: Physician Assistant

## 2022-08-27 ENCOUNTER — Encounter: Payer: Self-pay | Admitting: Physician Assistant

## 2022-08-27 ENCOUNTER — Encounter: Payer: Self-pay | Admitting: Cardiovascular Disease

## 2022-08-27 ENCOUNTER — Other Ambulatory Visit: Payer: Self-pay

## 2022-08-27 ENCOUNTER — Ambulatory Visit: Payer: Medicaid Other | Attending: Cardiovascular Disease | Admitting: Cardiovascular Disease

## 2022-08-27 VITALS — BP 160/98 | HR 70 | Ht 65.0 in | Wt 251.0 lb

## 2022-08-27 DIAGNOSIS — I10 Essential (primary) hypertension: Secondary | ICD-10-CM

## 2022-08-27 DIAGNOSIS — R0602 Shortness of breath: Secondary | ICD-10-CM | POA: Diagnosis not present

## 2022-08-27 MED ORDER — AMLODIPINE BESYLATE 10 MG PO TABS
10.0000 mg | ORAL_TABLET | Freq: Every day | ORAL | 3 refills | Status: DC
  Filled 2022-08-27 – 2022-09-18 (×2): qty 90, 90d supply, fill #0
  Filled 2023-01-06: qty 90, 90d supply, fill #1

## 2022-08-27 MED ORDER — AMLODIPINE BESYLATE 10 MG PO TABS: 10.0000 mg | ORAL_TABLET | Freq: Every day | ORAL | 3 refills | Status: DC

## 2022-08-27 MED ORDER — FUROSEMIDE 20 MG PO TABS: 20.0000 mg | ORAL_TABLET | Freq: Every day | ORAL | 3 refills | Status: DC

## 2022-08-27 MED ORDER — FUROSEMIDE 20 MG PO TABS
20.0000 mg | ORAL_TABLET | Freq: Every day | ORAL | 3 refills | Status: DC
  Filled 2022-08-27 – 2022-09-18 (×2): qty 90, 90d supply, fill #0
  Filled 2023-01-06: qty 30, 30d supply, fill #1

## 2022-08-27 NOTE — Progress Notes (Signed)
Chief Complaint  Patient presents with   Follow-up    Dyspnea   History of Present Illness: 52 yo female with history of asthma, depression, diabetes mellitus, GERD and HTN who is here today for cardiac follow up. I saw her as a new patient in July 2021 for the evaluation of lower extremity swelling. She had pneumonia in June 2021. BNP 66. She described dyspnea at rest and with exertion. Also with exertional chest pain located over her left chest wall. She also felt that she had been retaining fluid and described mild LE edema. Never smoker. Echo 11/24/19 with LVEF=50-55%. Moderate LVH. No significant valve disease. Coronary CTA 11/29/19 with no evidence of CAD. Calcium score zero. She was seen in the ED in May 2023 with dyspnea and chest pain. She was treated for an asthma exacerbation and left AMA.   She is here today for follow up. The patient denies any chest pain, palpitations, orthopnea, PND, dizziness, near syncope or syncope. She has mild LE edema at the end of her work day. Still with some dyspnea but has only been taking Lasix every now and then. Weight up 7 lbs.   Primary Care Physician: Marcine Matar, MD   Past Medical History:  Diagnosis Date   Anemia Dx 2013   Asthma    Bacterial vaginosis 2008   Complication of anesthesia    pt states was informed had difficult time awakening following hysterectomy    Depression    Depression    Diabetes mellitus Dx 2005   Fibroids 09/05/2011   08/08/2011: Anteverted uterus. Diffuse fibroid involvement. Left of midline fibroid with submucosal component. 3.4cm x 2.8cmx 3.4cm.    GERD (gastroesophageal reflux disease)    Gestational diabetes    H/O shortness of breath    pt states has improved since lossing weight; triggers currently are enviromental pt states can make it to top of stairs w/o having to stop to catch her breath   Headache(784.0)    migraines   History of blood transfusion 2013/2014   History of bronchitis    Hx of  blood clots    Hypertension Dx 2015   Kidney infection 2015   Menorrhagia 09/27/2010   Pneumonia    Postpartum depression    Pregnancy induced hypertension    Sleep apnea    mild, no CPAP recommended   Trichomonas     Past Surgical History:  Procedure Laterality Date   ABDOMINAL HYSTERECTOMY N/A 11/03/2012   Procedure: HYSTERECTOMY ABDOMINAL;  Surgeon: Kirkland Hun, MD;  Location: [facility];  Service: Gynecology;  Laterality: N/A;   BILATERAL SALPINGECTOMY Bilateral 11/03/2012   Procedure: BILATERAL SALPINGECTOMY;  Surgeon: Kirkland Hun, MD;  Location: [facility];  Service: Gynecology;  Laterality: Bilateral;   BURCH PROCEDURE N/A 11/03/2012   Procedure: BURCH PROCEDURE;  Surgeon: Kirkland Hun, MD;  Location: [facility];  Service: Gynecology;  Laterality: N/A;   CARPAL TUNNEL RELEASE Right 11/20/2021   Procedure: RIGHT CARPAL TUNNEL RELEASE;  Surgeon: Tarry Kos, MD;  Location: [facility];  Service: Orthopedics;  Laterality: Right;   CYSTOSCOPY N/A 11/03/2012   Procedure: CYSTOSCOPY;  Surgeon: Kirkland Hun, MD;  Location: [facility];  Service: Gynecology;  Laterality: N/A;   DILATION AND CURETTAGE OF UTERUS     LAPAROSCOPY N/A 11/03/2012   Procedure: LAPAROSCOPY DIAGNOSTIC;  Surgeon: Kirkland Hun, MD;  Location: [facility];  Service: Gynecology;  Laterality: N/A;   ORIF ANKLE FRACTURE Left 01/04/2016   Procedure: OPEN REDUCTION INTERNAL  FIXATION (ORIF) LEFT BIMALLEOLAR ANKLE FRACTURE;  Surgeon: Kathryne Hitch, MD;  Location: [facility];  Service: Orthopedics;  Laterality: Left;   TUBAL LIGATION     WISDOM TOOTH EXTRACTION     3 teeth    Current Outpatient Medications  Medication Sig Dispense Refill   albuterol (VENTOLIN HFA) 108 (90 Base) MCG/ACT inhaler Inhale 2 puffs into the lungs every 6 (six) hours as needed for wheezing or shortness of breath. 6.7 g 1   amLODipine (NORVASC) 10 MG tablet Take 1 tablet (10 mg total) by mouth daily. 90 tablet 3   atorvastatin (LIPITOR)  20 MG tablet Take 1 tablet (20 mg total) by mouth daily after supper. 30 tablet 3   diclofenac Sodium (VOLTAREN) 1 % GEL Apply 4 g topically 4 (four) times daily as needed. 500 g 6   Dulaglutide (TRULICITY) 1.5 MG/0.5ML SOPN Inject 1.5 mg into the skin once a week. 2 mL 4   FLUoxetine (PROZAC) 10 MG capsule Take 1 capsule (10 mg total) by mouth daily. 60 capsule 3   furosemide (LASIX) 20 MG tablet Take 1 tablet (20 mg total) by mouth daily. 90 tablet 3   HYDROcodone-acetaminophen (NORCO) 5-325 MG tablet Take 1 tablet by mouth every 6 (six) hours as needed. 20 tablet 0   insulin glargine (LANTUS) 100 UNIT/ML Solostar Pen Inject 10 Units into the skin at bedtime. 15 mL 3   Insulin Pen Needle (TRUEPLUS PEN NEEDLES) 32G X 4 MM MISC Use to inject insulin daily. 100 each 11   lisinopril-hydrochlorothiazide (ZESTORETIC) 20-12.5 MG tablet Take 2 tablets by mouth daily. 180 tablet 1   ONE TOUCH ULTRA TEST test strip USE  STRIP TO CHECK GLUCOSE THREE TIMES DAILY BEFORE MEAL(S) 100 each 5   omeprazole (PRILOSEC) 20 MG capsule TAKE 1 CAPSULE (20 MG TOTAL) BY MOUTH DAILY. 30 capsule 0   No current facility-administered medications for this visit.    No Active Allergies   Social History   Socioeconomic History   Marital status: Significant Other    Spouse name: Not on file   Number of children: 3   Years of education: Not on file   Highest education level: Not on file  Occupational History   Occupation: Unemployed  Tobacco Use   Smoking status: Never   Smokeless tobacco: Never  Substance and Sexual Activity   Alcohol use: Yes    Comment: social   Drug use: No   Sexual activity: Yes    Birth control/protection: Surgical    Comment: hysterectomy  Other Topics Concern   Not on file  Social History Narrative   Not on file   Social Determinants of Health   Financial Resource Strain: Not on file  Food Insecurity: Not on file  Transportation Needs: Not on file  Physical Activity: Not on  file  Stress: Not on file  Social Connections: Not on file  Intimate Partner Violence: Not on file    Family History  Problem Relation Age of Onset   Hypertension Brother    Stroke Brother    Sickle cell trait Daughter    Hypertension Father    Heart disease Mother    Rheumatic fever Mother    Hypertension Mother    Stroke Mother    CAD Mother    Heart disease Paternal Aunt     Review of Systems:  As stated in the HPI and otherwise negative.   BP (!) 160/98   Pulse 70   Ht 5\' 5"  (1.651  m)   Wt 113.9 kg   LMP 06/29/2012   SpO2 98%   BMI 41.77 kg/m   Physical Examination: General: Well developed, well nourished, NAD  HEENT: OP clear, mucus membranes moist  SKIN: warm, dry. No rashes. Neuro: No focal deficits  Musculoskeletal: Muscle strength 5/5 all ext  Psychiatric: Mood and affect normal  Neck: No JVD, no carotid bruits, no thyromegaly, no lymphadenopathy.  Lungs:Clear bilaterally, no wheezes, rhonci, crackles Cardiovascular: Regular rate and rhythm. No murmurs, gallops or rubs. Abdomen:Soft. Bowel sounds present. Non-tender.  Extremities: No lower extremity edema. Pulses are 2 + in the bilateral DP/PT.  EKG:  EKG is not ordered today. The ekg ordered today demonstrates   Coronary CTA 11/29/19: 1. Coronary artery calcium score 0 Agatston units. This suggests low risk for future cardiac events.   2.  No significant coronary disease was seen.  Echo 11/24/19:  1. Left ventricular ejection fraction, by estimation, is 50 to 55%. The  left ventricle has low normal function. The left ventricle has no regional  wall motion abnormalities. There is moderate asymmetric left ventricular  hypertrophy of the posterior  segment. Left ventricular diastolic parameters are consistent with Grade I  diastolic dysfunction (impaired relaxation). Elevated left ventricular  end-diastolic pressure. The average left ventricular global longitudinal  strain is -16.9 %. The global   longitudinal strain is normal.   2. Right ventricular systolic function is normal. The right ventricular  size is normal.   3. The mitral valve is normal in structure. No evidence of mitral valve  regurgitation. No evidence of mitral stenosis.   4. The aortic valve is normal in structure. Aortic valve regurgitation is  trivial. No aortic stenosis is present.   5. The inferior vena cava is normal in size with greater than 50%  respiratory variability, suggesting right atrial pressure of 3 mmHg.   Recent Labs: 04/08/2022: ALT 43; BNP 111.6; Hemoglobin 13.8; Platelets 225 06/13/2022: BUN 11; Creatinine, Ser 0.85; Potassium 3.9; Sodium 144; TSH 0.752   Lipid Panel    Component Value Date/Time   CHOL 116 04/08/2022 1105   TRIG 120 04/08/2022 1105   HDL 51 04/08/2022 1105   CHOLHDL 2.3 04/08/2022 1105   CHOLHDL 2.9 08/31/2015 1027   VLDL 23 08/31/2015 1027   LDLCALC 44 04/08/2022 1105    Wt Readings from Last 3 Encounters:  08/27/22 113.9 kg  06/13/22 110.7 kg  04/08/22 112.9 kg    Assessment and Plan:   1. Dyspnea Echo in August 2021 with normal LV systolic function. Moderate LVH. Coronary CTA in 2021 with no evidence of CAD.   2. Chronic diastolic CHF: Mild LE edema at the end of the day. Weight up 7 lbs. Will have her take Lasix 20 mg daily.     3. HTN: BP is elevated here today and at home. Will increase Norvasc to 10 mg daily. Continue Zestoretic.   Labs/ tests ordered today include:   Orders Placed This Encounter  Procedures   Basic metabolic panel   Disposition:   F/U with me in 1 year  Signed, Verne Carrow, MD 08/27/2022 11:32 AM    [facility] [address] [place], [place], [place]  [address] Phone: [phone]; Fax: [phone]

## 2022-08-27 NOTE — Patient Instructions (Addendum)
Medication Instructions:  Your physician has recommended you make the following change in your medication:  INCREASE: amlodipine (Norvasc) to 10 mg by mouth once daily TAKE: furosemide (Lasix) 20 mg by mouth once daily  *If you need a refill on your cardiac medications before your next appointment, please call your pharmacy*   Lab Work: IN 2 WEEKS: BMP  If you have labs (blood work) drawn today and your tests are completely normal, you will receive your results only by: MyChart Message (if you have MyChart) OR A paper copy in the mail If you have any lab test that is abnormal or we need to change your treatment, we will call you to review the results.   Testing/Procedures: NONE   Follow-Up: At [facility], you and your health needs are our priority.  As part of our continuing mission to provide you with exceptional heart care, we have created designated Provider Care Teams.  These Care Teams include your primary Cardiologist (physician) and Advanced Practice Providers (APPs -  Physician Assistants and Nurse Practitioners) who all work together to provide you with the care you need, when you need it.  We recommend signing up for the patient portal called "MyChart".  Sign up information is provided on this After Visit Summary.  MyChart is used to connect with patients for Virtual Visits (Telemedicine).  Patients are able to view lab/test results, encounter notes, upcoming appointments, etc.  Non-urgent messages can be sent to your provider as well.   To learn more about what you can do with MyChart, go to [email or web address].    Your next appointment:   1 year(s)  Provider:   Verne Carrow, MD

## 2022-08-27 NOTE — Telephone Encounter (Signed)
Pt called requesting her medication be resent to another pharmacy. Medication resent. Confirmation received.

## 2022-08-30 DIAGNOSIS — Z419 Encounter for procedure for purposes other than remedying health state, unspecified: Secondary | ICD-10-CM | POA: Diagnosis not present

## 2022-09-01 NOTE — Progress Notes (Deleted)
S:     No chief complaint on file.  52 y.o. female who presents for diabetes evaluation, education, and management.  PMH is significant for ***.  Patient was referred and last seen by Primary Care Provider, Dr. ***, on ***.  *** Patient was referred by *** on ***. Patient was last seen by Primary Care Provider, Dr. ***, on ***.  At last visit, ***.   Today, patient arrives in *** good spirits and presents without *** any assistance. ***  Patient reports Diabetes was diagnosed in ***.   Family/Social History: ***  Current diabetes medications include: *** Current hypertension medications include: *** Current hyperlipidemia medications include: ***  Patient reports adherence to taking all medications as prescribed.  *** Patient denies adherence with medications, reports missing *** medications *** times per week, on average.  Do you feel that your medications are working for you? {YES NO:22349} Have you been experiencing any side effects to the medications prescribed? {YES NO:22349} Do you have any problems obtaining medications due to transportation or finances? {YES [id number] Insurance coverage: ***  Patient {Actions; denies-reports:120008} hypoglycemic events.  Reported home fasting blood sugars: ***  Reported 2 hour post-meal/random blood sugars: ***.  Patient {Actions; denies-reports:120008} nocturia (nighttime urination).  Patient {Actions; denies-reports:120008} neuropathy (nerve pain). Patient {Actions; denies-reports:120008} visual changes. Patient {Actions; denies-reports:120008} self foot exams.   Patient reported dietary habits: Eats *** meals/day Breakfast: *** Lunch: *** Dinner: *** Snacks: *** Drinks: ***  Within the past 12 months, did you worry whether your food would run out before you got money to buy more? {YES NO:22349} Within the past 12 months, did the food you bought run out, and you didn't have money to get more? {YES NO:22349} PHQ-9 Score:  ***  Patient-reported exercise habits: ***   O:   ROS  Physical Exam  7 day average blood glucose: ***  *** CGM Download:  % Time CGM is active: ***% Average Glucose: *** mg/dL Glucose Management Indicator: ***  Glucose Variability: *** (goal <36%) Time in Goal:  - Time in range 70-180: ***% - Time above range: ***% - Time below range: ***% Observed patterns:   Lab Results  Component Value Date   HGBA1C 10.6 (A) 04/08/2022   There were no vitals filed for this visit.  Lipid Panel     Component Value Date/Time   CHOL 116 04/08/2022 1105   TRIG 120 04/08/2022 1105   HDL 51 04/08/2022 1105   CHOLHDL 2.3 04/08/2022 1105   CHOLHDL 2.9 08/31/2015 1027   VLDL 23 08/31/2015 1027   LDLCALC 44 04/08/2022 1105    Clinical Atherosclerotic Cardiovascular Disease (ASCVD): {YES/NO:21197} The ASCVD Risk score (Arnett DK, et al., 2019) failed to calculate for the following reasons:   The valid total cholesterol range is 130 to 320 mg/dL   Patient is participating in a Managed Medicaid Plan:  {MM YES/NO:27447::"Yes"}   A/P: Diabetes longstanding *** currently ***. Patient is *** able to verbalize appropriate hypoglycemia management plan. Medication adherence appears ***. Control is suboptimal due to ***. -{Meds adjust:18428} basal insulin *** Lantus/Basaglar/Semglee (insulin glargine) *** Tresiba (insulin degludec) from *** units to *** units daily in the morning. Patient will continue to titrate 1 unit every *** days if fasting blood sugar > 100mg /dl until fasting blood sugars reach goal or next visit.  -{Meds adjust:18428} rapid insulin *** Novolog (insulin aspart) *** Humalog (insulin lispro) from *** to ***.  -{Meds adjust:18428} GLP-1 *** Trulicity (dulaglutide) *** Ozempic (semaglutide) *** Mounjaro (tirzepatide)  from *** mg to *** mg .  -{Meds adjust:18428} SGLT2-I *** Farxiga (dapagliflozin) *** Jardiance (empagliflozin) 10 mg. Counseled on sick day rules. -{Meds  adjust:18428} metformin ***.  -Patient educated on purpose, proper use, and potential adverse effects of ***.  -Extensively discussed pathophysiology of diabetes, recommended lifestyle interventions, dietary effects on blood sugar control.  -Counseled on s/sx of and management of hypoglycemia.  -Next A1c anticipated ***.   ASCVD risk - primary ***secondary prevention in patient with diabetes. Last LDL is *** not at goal of <09 *** mg/dL. ASCVD risk factors include *** and 10-year ASCVD risk score of ***. {Desc; low/moderate/high:110033} intensity statin indicated.  -{Meds adjust:18428} ***statin *** mg.   Hypertension longstanding *** currently ***. Blood pressure goal of <130/80 *** mmHg. Medication adherence ***. Blood pressure control is suboptimal due to ***. -{Meds adjust:18428} *** mg.  Written patient instructions provided. Patient verbalized understanding of treatment plan.  Total time in face to face counseling *** minutes.    Follow-up:  Pharmacist ***. PCP clinic visit in ***.  Patient seen with ***

## 2022-09-02 ENCOUNTER — Encounter: Payer: Self-pay | Admitting: Pharmacist

## 2022-09-02 ENCOUNTER — Ambulatory Visit: Payer: Medicaid Other | Attending: Internal Medicine | Admitting: Pharmacist

## 2022-09-02 DIAGNOSIS — Z7985 Long-term (current) use of injectable non-insulin antidiabetic drugs: Secondary | ICD-10-CM | POA: Diagnosis not present

## 2022-09-02 DIAGNOSIS — E1159 Type 2 diabetes mellitus with other circulatory complications: Secondary | ICD-10-CM

## 2022-09-02 DIAGNOSIS — E1169 Type 2 diabetes mellitus with other specified complication: Secondary | ICD-10-CM

## 2022-09-02 DIAGNOSIS — Z794 Long term (current) use of insulin: Secondary | ICD-10-CM | POA: Diagnosis not present

## 2022-09-02 DIAGNOSIS — I152 Hypertension secondary to endocrine disorders: Secondary | ICD-10-CM

## 2022-09-02 NOTE — Progress Notes (Signed)
S:     No chief complaint on file.  52 y.o. female who presents for hypertension evaluation, education, and management.  PMH is significant for HTN, HFpEF (last EF 50-55%), T2DM, MDD, obesity.  Patient was referred and last seen by Primary Care Provider, Dr. Laural Benes, on 06/13/2022.   At that visit, BP was 172/122. Dr. Laural Benes noted that pt was without amlodipine ~2 weeks. Since that visit, pt was seen by Dr. Clifton James and he increased amlodipine to 10  mg daily and changed furosemide to 20mg  daily. She has not picked it up as it was sent to [facility]. She has been taking 2 of the 5 mg tablets of amlodipine.    Today, patient arrives in good spirits and presents without assistance. Denies dizziness, headache, blurred vision, swelling. Denies any palpitations currently. Patient reports hypertension is longstanding.   Family/Social history:  Fhx: HTN, stroke, heart disease,  CAD Tobacco: never smoker  Alcohol: does not drink but socially   Medication adherence reported. Patient has not taken BP medications today. She usually takes them in the mornings.   Current antihypertensives include: amlodipine 10 mg daily, Lasix 20 mg daily, lisinopril/HCTZ 40/25 mg daily    Reported home BP readings:  -Has a machine, wrist cuff   Patient reported dietary habits: -Sodium: does not cook with salt, uses Mrs[employer] Dash - has cut back on fast food -Caffeine: drinks coffee and flavored water (low-sugar, sugar-free) --> does not drink soda   Patient-reported exercise habits:  -Clean houses for a living  Patient-reported CBG values:  -200s   O:  ROS  Physical Exam  Last 3 Office BP readings: BP Readings from Last 3 Encounters:  08/27/22 (!) 160/98  06/13/22 (!) 172/122  04/08/22 (!) 161/97    BMET    Component Value Date/Time   NA 144 06/13/2022 1112   K 3.9 06/13/2022 1112   CL 104 06/13/2022 1112   CO2 25 06/13/2022 1112   GLUCOSE 131 (H) 06/13/2022 1112   GLUCOSE 183 (H) 11/19/2021 1520    BUN 11 06/13/2022 1112   CREATININE 0.85 06/13/2022 1112   CREATININE 0.93 08/31/2015 1027   CALCIUM 9.6 06/13/2022 1112   GFRNONAA >60 11/19/2021 1520   GFRNONAA 75 08/31/2015 1027   GFRAA 84 10/20/2019 1624   GFRAA 86 08/31/2015 1027    Renal function: CrCl cannot be calculated (Patient's most recent lab result is older than the maximum 21 days allowed.).  Clinical ASCVD: No  The ASCVD Risk score (Arnett DK, et al., 2019) failed to calculate for the following reasons:   The valid total cholesterol range is 130 to 320 mg/dL  Patient is participating in a Managed Medicaid Plan: yes    A/P: Hypertension diagnosed currently uncontrolled but today's BP is improved on current medications. BP goal < 130/80 mmHg. Medication adherence appears appropriate. Resent rxns to our pharmacy and she'll pick these up today.  -Continued current regimen given improvement. Amlodipine was just increased last week.   -F/u labs ordered - BMP, A1c -Counseled on lifestyle modifications for blood pressure control including reduced dietary sodium, increased exercise, adequate sleep. -Encouraged patient to check BP at home and bring log of readings to next visit. Counseled on proper use of home BP cuff.   Results reviewed and written information provided.    Written patient instructions provided. Patient verbalized understanding of treatment plan.  Total time in face to face counseling 30 minutes.    Follow-up:  Pharmacist in 1 month.  Georgiana Shore  Flonnie Overman, PharmD, Patsy [place], CPP Clinical Pharmacist [facility] [phone]

## 2022-09-03 ENCOUNTER — Encounter: Payer: Self-pay | Admitting: Internal Medicine

## 2022-09-03 ENCOUNTER — Telehealth: Payer: Self-pay | Admitting: Pharmacist

## 2022-09-03 ENCOUNTER — Other Ambulatory Visit: Payer: Self-pay

## 2022-09-03 DIAGNOSIS — E1169 Type 2 diabetes mellitus with other specified complication: Secondary | ICD-10-CM

## 2022-09-03 LAB — BMP8+EGFR
BUN/Creatinine Ratio: 13 (ref 9–23)
BUN: 11 mg/dL (ref 6–24)
CO2: 24 mmol/L (ref 20–29)
Calcium: 9.1 mg/dL (ref 8.7–10.2)
Chloride: 103 mmol/L (ref 96–106)
Creatinine, Ser: 0.86 mg/dL (ref 0.57–1.00)
Glucose: 171 mg/dL — ABNORMAL HIGH (ref 70–99)
Potassium: 4 mmol/L (ref 3.5–5.2)
Sodium: 141 mmol/L (ref 134–144)
eGFR: 82 mL/min/{1.73_m2} (ref >59)

## 2022-09-03 LAB — HEMOGLOBIN A1C
Est. average glucose Bld gHb Est-mCnc: 209 mg/dL
Hgb A1c MFr Bld: 8.9 % — ABNORMAL HIGH (ref 4.8–5.6)

## 2022-09-03 MED ORDER — INSULIN GLARGINE 100 UNIT/ML SOLOSTAR PEN
24.0000 [IU] | PEN_INJECTOR | Freq: Every day | SUBCUTANEOUS | 2 refills | Status: DC
  Filled 2022-09-03 – 2022-09-18 (×2): qty 15, 62d supply, fill #0
  Filled 2022-11-18: qty 15, 62d supply, fill #1
  Filled 2023-02-12: qty 15, 62d supply, fill #2

## 2022-09-03 NOTE — Telephone Encounter (Signed)
Call placed to patient. Verified I was speaking to the patient using two identifiers. I introduced myself and explained the reason for my call.   A1c result yesterday down to 8.9% (from 10.6%). Commended patient for this but explained that it is still above goal. We gave priority to HTN management yesterday.  Regarding her DM:   - Pt is in the 200 range mostly. Tells me values >300 are outliers. Denies hypoglycemia.   - Does have some neuropathy but denies polyuria, polydipsia.   - She is not having any NV, abdominal pain with the Trulicity but does NOT take consistently. She plans to resume this this week. It's difficult for her to remember to take a once weekly injection. She plans to utilize an alarm on her phone.   - She endorses adherence with Lantus 18u daily. I instructed her to increase this to 24u daily and continue with Trulicity. She has an appt scheduled with me for reassessment in July (7/2).   Routing information to patient's PCP for review. I have updated her Lantus rxn in Epic.

## 2022-09-10 ENCOUNTER — Ambulatory Visit: Payer: Medicaid Other | Attending: Cardiovascular Disease

## 2022-09-10 ENCOUNTER — Other Ambulatory Visit: Payer: Self-pay

## 2022-09-11 ENCOUNTER — Encounter: Payer: Self-pay | Admitting: Internal Medicine

## 2022-09-11 NOTE — Progress Notes (Signed)
I received a letter from [facility] dated 09/08/2022 informing me that patient no showed her appointment without rescheduling.  The referral is closed.

## 2022-09-18 ENCOUNTER — Other Ambulatory Visit: Payer: Self-pay

## 2022-09-29 DIAGNOSIS — Z419 Encounter for procedure for purposes other than remedying health state, unspecified: Secondary | ICD-10-CM | POA: Diagnosis not present

## 2022-09-30 ENCOUNTER — Ambulatory Visit: Payer: Medicaid Other | Admitting: Pharmacist

## 2022-10-17 ENCOUNTER — Ambulatory Visit: Payer: Self-pay | Admitting: Internal Medicine

## 2022-10-30 DIAGNOSIS — Z419 Encounter for procedure for purposes other than remedying health state, unspecified: Secondary | ICD-10-CM | POA: Diagnosis not present

## 2022-11-18 ENCOUNTER — Other Ambulatory Visit: Payer: Self-pay

## 2022-11-30 DIAGNOSIS — Z419 Encounter for procedure for purposes other than remedying health state, unspecified: Secondary | ICD-10-CM | POA: Diagnosis not present

## 2022-12-30 DIAGNOSIS — Z419 Encounter for procedure for purposes other than remedying health state, unspecified: Secondary | ICD-10-CM | POA: Diagnosis not present

## 2023-01-06 ENCOUNTER — Other Ambulatory Visit: Payer: Self-pay

## 2023-01-06 ENCOUNTER — Other Ambulatory Visit: Payer: Self-pay | Admitting: Internal Medicine

## 2023-01-06 DIAGNOSIS — E1169 Type 2 diabetes mellitus with other specified complication: Secondary | ICD-10-CM

## 2023-01-06 MED ORDER — ATORVASTATIN CALCIUM 20 MG PO TABS
20.0000 mg | ORAL_TABLET | Freq: Every day | ORAL | 3 refills | Status: DC
  Filled 2023-01-06 – 2023-02-12 (×2): qty 30, 30d supply, fill #0

## 2023-01-06 NOTE — Telephone Encounter (Signed)
Requested Prescriptions  Pending Prescriptions Disp Refills   atorvastatin (LIPITOR) 20 MG tablet 30 tablet 3    Sig: Take 1 tablet (20 mg total) by mouth daily after supper.     Cardiovascular:  Antilipid - Statins Failed - 01/06/2023 11:14 AM      Failed - Lipid Panel in normal range within the last 12 months    Cholesterol, Total  Date Value Ref Range Status  04/08/2022 116 100 - 199 mg/dL Final   LDL Chol Calc (NIH)  Date Value Ref Range Status  04/08/2022 44 0 - 99 mg/dL Final   HDL  Date Value Ref Range Status  04/08/2022 51 >39 mg/dL Final   Triglycerides  Date Value Ref Range Status  04/08/2022 120 0 - 149 mg/dL Final         Passed - Patient is not pregnant      Passed - Valid encounter within last 12 months    Recent Outpatient Visits           4 months ago Type 2 diabetes mellitus with morbid obesity (HCC)   [place] [facility] [place], Johnson [address] L, RPH-CPP   6 months ago Type 2 diabetes mellitus with morbid obesity [facility])   [place] [employer] [facility] Jonah Blue B, MD   9 months ago Type 2 diabetes mellitus with obesity [facility])   [facility] Jonah Blue B, MD   2 years ago Type 2 diabetes mellitus with obesity [facility])   [place] [facility] Marcine Matar, MD   3 years ago Acute vaginitis   [facility] [place], Shea Stakes, NP

## 2023-01-16 ENCOUNTER — Other Ambulatory Visit: Payer: Self-pay

## 2023-02-03 ENCOUNTER — Other Ambulatory Visit: Payer: Self-pay

## 2023-02-12 ENCOUNTER — Other Ambulatory Visit: Payer: Self-pay

## 2023-02-12 ENCOUNTER — Other Ambulatory Visit: Payer: Self-pay | Admitting: Internal Medicine

## 2023-02-16 ENCOUNTER — Other Ambulatory Visit: Payer: Self-pay

## 2023-02-18 ENCOUNTER — Other Ambulatory Visit: Payer: Self-pay

## 2023-04-22 ENCOUNTER — Ambulatory Visit: Payer: Self-pay | Admitting: Internal Medicine

## 2023-04-22 NOTE — Telephone Encounter (Signed)
    Chief Complaint: lower back pain and hyperglycemia Symptoms: stabbing pain, increase urine output Frequency: constant   Disposition: [] ED /[x] Urgent Care (no appt availability in office) / [] Appointment(In office/virtual)/ []  [facility]/ [] Home Care/ [] Refused Recommended Disposition /[] [facility]/ []  Follow-up with PCP Additional Notes: Pt called complaining lower back pain-bilaterally. Pt also stated her blood sugar was 338 last night but hasn't checked it since then. Pt states the pain is "moderate" but increasing. Pt has numbness in all fingertips. Pt states she doesn't monitor her blood sugars like she should, but does try to eat clean. Pt only take Lantus injection  and doesn't take Trulicity due to needing to be reordered. Pt states her sugars are never below 200. Pt stated she is urinating a lot and has severe dry mouth. Per protocol, pt needs to be within 24 hours. RN advised pt to go to urgent care now to have these concerns addressed. Pt verbalized she would go tonight and call back tomorrow to get follow up with PCP. RN gave care advice and pt verbalized understanding.        Copied from CRM [id number]. Topic: Clinical - Red Word Triage >> Apr 22, 2023  4:38 PM Elle L wrote: Red Word that prompted transfer to Nurse Triage: The patient states she has a severe pain in her kidneys and her blood sugar is over 200. Reason for Disposition  [1] Numbness in an arm or hand (i.e., loss of sensation) AND [2] upper back pain  Answer Assessment - Initial Assessment Questions 1. ONSET: "When did the pain begin?"      1 week ago 2. LOCATION: "Where does it hurt?" (upper, mid or lower back)     Lower back on both sides 3. SEVERITY: "How bad is the pain?"  (e.g., Scale 1-10; mild, moderate, or severe)   - MILD (1-3): Doesn't interfere with normal activities.    - MODERATE (4-7): Interferes with normal activities or awakens from sleep.    - SEVERE (8-10): Excruciating  pain, unable to do any normal activities.      moderate 4. PATTERN: "Is the pain constant?" (e.g., yes, no; constant, intermittent)      Constant  5. RADIATION: "Does the pain shoot into your legs or somewhere else?"     Yes, shoots up into ribs 6. CAUSE:  "What do you think is causing the back pain?"      unsure 7. BACK OVERUSE:  "Any recent lifting of heavy objects, strenuous work or exercise?"     no 8. MEDICINES: "What have you taken so far for the pain?" (e.g., nothing, acetaminophen, NSAIDS)     Aleve  9. NEUROLOGIC SYMPTOMS: "Do you have any weakness, numbness, or problems with bowel/bladder control?"     Finger tips-both hands 10. OTHER SYMPTOMS: "Do you have any other symptoms?" (e.g., fever, abdomen pain, burning with urination, blood in urine)       High blood sugar-338 last night  Protocols used: Back Pain-A-AH

## 2023-04-22 NOTE — Telephone Encounter (Signed)
noted 

## 2023-04-23 ENCOUNTER — Emergency Department (HOSPITAL_BASED_OUTPATIENT_CLINIC_OR_DEPARTMENT_OTHER)
Admission: EM | Admit: 2023-04-23 | Discharge: 2023-04-23 | Disposition: A | Discharge status: Home / Self Care | Payer: Self-pay | Attending: Emergency Medicine | Admitting: Emergency Medicine

## 2023-04-23 ENCOUNTER — Emergency Department (HOSPITAL_BASED_OUTPATIENT_CLINIC_OR_DEPARTMENT_OTHER): Payer: Self-pay

## 2023-04-23 ENCOUNTER — Ambulatory Visit: Payer: Self-pay | Admitting: Internal Medicine

## 2023-04-23 ENCOUNTER — Encounter (HOSPITAL_BASED_OUTPATIENT_CLINIC_OR_DEPARTMENT_OTHER): Payer: Self-pay | Admitting: Emergency Medicine

## 2023-04-23 ENCOUNTER — Emergency Department (HOSPITAL_BASED_OUTPATIENT_CLINIC_OR_DEPARTMENT_OTHER): Payer: Self-pay | Admitting: Radiology

## 2023-04-23 ENCOUNTER — Other Ambulatory Visit: Payer: Self-pay

## 2023-04-23 DIAGNOSIS — R739 Hyperglycemia, unspecified: Secondary | ICD-10-CM | POA: Insufficient documentation

## 2023-04-23 DIAGNOSIS — R0781 Pleurodynia: Secondary | ICD-10-CM | POA: Insufficient documentation

## 2023-04-23 DIAGNOSIS — R1010 Upper abdominal pain, unspecified: Secondary | ICD-10-CM

## 2023-04-23 DIAGNOSIS — R350 Frequency of micturition: Secondary | ICD-10-CM | POA: Insufficient documentation

## 2023-04-23 DIAGNOSIS — Z79899 Other long term (current) drug therapy: Secondary | ICD-10-CM | POA: Insufficient documentation

## 2023-04-23 DIAGNOSIS — R1011 Right upper quadrant pain: Secondary | ICD-10-CM | POA: Insufficient documentation

## 2023-04-23 DIAGNOSIS — Z794 Long term (current) use of insulin: Secondary | ICD-10-CM | POA: Insufficient documentation

## 2023-04-23 LAB — URINALYSIS, W/ REFLEX TO CULTURE (INFECTION SUSPECTED)
Bacteria, UA: NONE SEEN
Bilirubin Urine: NEGATIVE
Glucose, UA: >1000 mg/dL — AB
Hgb urine dipstick: NEGATIVE
Ketones, ur: NEGATIVE mg/dL
Leukocytes,Ua: NEGATIVE
Nitrite: NEGATIVE
Protein, ur: NEGATIVE mg/dL
Specific Gravity, Urine: 1.031 — ABNORMAL HIGH (ref 1.005–1.030)
pH: 5 (ref 5.0–8.0)

## 2023-04-23 LAB — HEPATIC FUNCTION PANEL
ALT: 19 U/L (ref 0–44)
AST: 14 U/L — ABNORMAL LOW (ref 15–41)
Albumin: 4.3 g/dL (ref 3.5–5.0)
Alkaline Phosphatase: 77 U/L (ref 38–126)
Bilirubin, Direct: 0.2 mg/dL (ref 0.0–0.2)
Indirect Bilirubin: 0.6 mg/dL (ref 0.3–0.9)
Total Bilirubin: 0.8 mg/dL (ref 0.0–1.2)
Total Protein: 7.1 g/dL (ref 6.5–8.1)

## 2023-04-23 LAB — CBC
HCT: 42.5 % (ref 36.0–46.0)
Hemoglobin: 14.3 g/dL (ref 12.0–15.0)
MCH: 28.9 pg (ref 26.0–34.0)
MCHC: 33.6 g/dL (ref 30.0–36.0)
MCV: 86 fL (ref 80.0–100.0)
Platelets: 233 10*3/uL (ref 150–400)
RBC: 4.94 MIL/uL (ref 3.87–5.11)
RDW: 12.7 % (ref 11.5–15.5)
WBC: 5.1 10*3/uL (ref 4.0–10.5)
nRBC: 0 % (ref 0.0–0.2)

## 2023-04-23 LAB — CBG MONITORING, ED
Glucose-Capillary: 410 mg/dL — ABNORMAL HIGH (ref 70–99)
Glucose-Capillary: 356 mg/dL — ABNORMAL HIGH (ref 70–99)
Glucose-Capillary: 285 mg/dL — ABNORMAL HIGH (ref 70–99)
Glucose-Capillary: 155 mg/dL — ABNORMAL HIGH (ref 70–99)

## 2023-04-23 LAB — BASIC METABOLIC PANEL
Anion gap: 11 (ref 5–15)
BUN: 20 mg/dL (ref 6–20)
CO2: 29 mmol/L (ref 22–32)
Calcium: 9.4 mg/dL (ref 8.9–10.3)
Chloride: 97 mmol/L — ABNORMAL LOW (ref 98–111)
Creatinine, Ser: 1.02 mg/dL — ABNORMAL HIGH (ref 0.44–1.00)
GFR, Estimated: >60 mL/min (ref >60)
Glucose, Bld: 407 mg/dL — ABNORMAL HIGH (ref 70–99)
Potassium: 4.3 mmol/L (ref 3.5–5.1)
Sodium: 137 mmol/L (ref 135–145)

## 2023-04-23 LAB — TROPONIN I (HIGH SENSITIVITY): Troponin I (High Sensitivity): 13 ng/L (ref <18)

## 2023-04-23 LAB — LIPASE, BLOOD: Lipase: 15 U/L (ref 11–51)

## 2023-04-23 MED ORDER — IOHEXOL 300 MG/ML  SOLN
100.0000 mL | Freq: Once | INTRAMUSCULAR | Status: AC | PRN
  Administered 2023-04-23: 100 mL via INTRAVENOUS

## 2023-04-23 MED ORDER — INSULIN ASPART 100 UNIT/ML IJ SOLN
10.0000 [IU] | Freq: Once | INTRAMUSCULAR | Status: AC
  Administered 2023-04-23: 10 [IU] via INTRAVENOUS

## 2023-04-23 MED ORDER — INSULIN ASPART 100 UNIT/ML IJ SOLN
8.0000 [IU] | Freq: Once | INTRAMUSCULAR | Status: AC
  Administered 2023-04-23: 8 [IU] via INTRAVENOUS

## 2023-04-23 MED ORDER — INSULIN REGULAR HUMAN 100 UNIT/ML IJ SOLN: 8.0000 [IU] | Freq: Once | INTRAMUSCULAR | Status: DC

## 2023-04-23 NOTE — ED Triage Notes (Signed)
Pt referred by UC, pt c/o CP and shob, LT side rib pain, and LT side lower back pain "for a couple weeks". Also reports uncontrolled sugar for "a couple months". Aleve at 1000 with mild relief

## 2023-04-23 NOTE — Discharge Instructions (Signed)
Follow-up with your doctor for potential adjustment of your glucose control regimen.  You may need to either go back on the Trulicity or start a different medicine.

## 2023-04-23 NOTE — ED Notes (Signed)
Pt provided diet ginger ale and popchips, per request.

## 2023-04-23 NOTE — ED Notes (Addendum)
Patient was given some ice water.

## 2023-04-23 NOTE — Telephone Encounter (Signed)
  Chief Complaint: elevated blood glucose Symptoms: numbness in fingertips, muscle cramps, increased urination, increased thirst Frequency: worsened this morning Pertinent Negatives: Patient denies lightheaded, drowsy Disposition: [x] ED /[] Urgent Care (no appt availability in office) / [] Appointment(In office/virtual)/ []  [facility]/ [] Home Care/ [] Refused Recommended Disposition /[] [facility]/ []  Follow-up with PCP Additional Notes: Patient started triage encounter stating she does not wish to go to urgent care or emergency department. She states she has work and medical bills and can not go. Advised patient due to BG level greater than 500 and symptomatic her symptoms are emergent and she should seek emergency medical treatment. Educated patient on potential long term effects and damage from elevated blood glucose levels. Patient agreeable to go to ED.   Copied from CRM [id number]. Topic: Clinical - Red Word Triage >> Apr 23, 2023 10:52 AM Dimitri Ped wrote: [facility] that prompted transfer to Nurse Triage: patient calling cause she didn't go to the emergency . Sugar is 502 now last night when I check it was at 285 then around midnight I was at 327 . Dont know if meter is wrong . Dont feel like I'm tripping or nothing Reason for Disposition  Blood glucose > 500 mg/dL (64.4 mmol/L)  Answer Assessment - Initial Assessment Questions 1. BLOOD GLUCOSE: "What is your blood glucose level?"      502.  2. ONSET: "When did you check the blood glucose?"     20 min prior to calling this morning.  3. USUAL RANGE: "What is your glucose level usually?" (e.g., usual fasting morning value, usual evening value)     Around 200s to 250.  4. KETONES: "Do you check for ketones (urine or blood test strips)?" If Yes, ask: "What does the test show now?"      Denies.  5. TYPE 1 or 2:  "Do you know what type of diabetes you have?"  (e.g., Type 1, Type 2, Gestational; doesn't know)      Type  2.  6. INSULIN: "Do you take insulin?" "What type of insulin(s) do you use? What is the mode of delivery? (syringe, pen; injection or pump)?"      Lantus; and states she gave herself 15 units of fast acting insulin that she took from her mother.  7. DIABETES PILLS: "Do you take any pills for your diabetes?" If Yes, ask: "Have you missed taking any pills recently?"     Patient states she had taken herself off metformin due to side effects.  8. OTHER SYMPTOMS: "Do you have any symptoms?" (e.g., fever, frequent urination, difficulty breathing, dizziness, weakness, vomiting)     Fingers numb, muscle cramps, lower back/kidney pain.  Protocols used: Diabetes - High Blood Sugar-A-AH

## 2023-04-23 NOTE — ED Provider Notes (Signed)
[place] EMERGENCY DEPARTMENT AT [facility] Provider Note   CSN: [id number] Arrival date & time: 04/23/23  1256     History  Chief Complaint  Patient presents with   Chest Pain    Julia Dunn is a 53 y.o. female.   Chest Pain Patient presents with chest pain abdominal pain back pain.  Is had for weeks.  States had some urinary frequency.  Had difficulty with her sugar going high.  States it has been high since really September with it now being January.  States she is put on some weight.  Has had urinary frequency.  States he feels that there is a fullness on the right back.  No nausea or vomiting.  States she has been off her Trulicity but still on her insulin.     Home Medications Prior to Admission medications   Medication Sig Start Date End Date Taking? Authorizing Provider  albuterol (VENTOLIN HFA) 108 (90 Base) MCG/ACT inhaler Inhale 2 puffs into the lungs every 6 (six) hours as needed for wheezing or shortness of breath. 04/08/22   Marcine Matar, MD  amLODipine (NORVASC) 10 MG tablet Take 1 tablet (10 mg total) by mouth daily. 08/27/22   Kathleene Hazel, MD  atorvastatin (LIPITOR) 20 MG tablet Take 1 tablet (20 mg total) by mouth daily after supper. 01/06/23   Marcine Matar, MD  diclofenac Sodium (VOLTAREN) 1 % GEL Apply 4 g topically 4 (four) times daily as needed. 03/14/19   Hilts, Casimiro Needle, MD  Dulaglutide (TRULICITY) 1.5 MG/0.5ML SOPN Inject 1.5 mg into the skin once a week. 06/13/22   Marcine Matar, MD  FLUoxetine (PROZAC) 10 MG capsule Take 1 capsule (10 mg total) by mouth daily. 04/08/22   Marcine Matar, MD  furosemide (LASIX) 20 MG tablet Take 1 tablet (20 mg total) by mouth daily. 08/27/22   Kathleene Hazel, MD  HYDROcodone-acetaminophen (NORCO) 5-325 MG tablet Take 1 tablet by mouth every 6 (six) hours as needed. 11/20/21   Tarry Kos, MD  insulin glargine (LANTUS) 100 UNIT/ML Solostar Pen Inject 24 Units into the skin at  bedtime. 09/03/22   Marcine Matar, MD  Insulin Pen Needle (TRUEPLUS PEN NEEDLES) 32G X 4 MM MISC Use to inject insulin daily. 04/21/19   Fulp, Cammie, MD  lisinopril-hydrochlorothiazide (ZESTORETIC) 20-12.5 MG tablet Take 2 tablets by mouth daily. 04/08/22   Marcine Matar, MD  omeprazole (PRILOSEC) 20 MG capsule TAKE 1 CAPSULE (20 MG TOTAL) BY MOUTH DAILY. 01/02/21 01/02/22  Marcine Matar, MD  ONE TOUCH ULTRA TEST test strip USE  STRIP TO CHECK GLUCOSE THREE TIMES DAILY BEFORE MEAL(S) 10/23/16   Hoy Register, MD      Allergies    Metformin and related    Review of Systems   Review of Systems  Cardiovascular:  Positive for chest pain.    Physical Exam Updated Vital Signs BP 122/84   Pulse 61   Temp 98.7 F (37.1 C) (Oral)   Resp 18   LMP 06/29/2012   SpO2 98%  Physical Exam Vitals and nursing note reviewed.  Constitutional:      Appearance: She is obese.  Cardiovascular:     Rate and Rhythm: Normal rate.  Pulmonary:     Breath sounds: No rhonchi.  Chest:     Chest wall: No tenderness.  Abdominal:     Comments: Mild tenderness to right abdomen.  No rebound or guarding.  No hernia palpated.  Musculoskeletal:  Right lower leg: No tenderness.     Left lower leg: No tenderness.  Neurological:     Mental Status: She is alert.     ED Results / Procedures / Treatments   Labs (all labs ordered are listed, but only abnormal results are displayed) Labs Reviewed  BASIC METABOLIC PANEL - Abnormal; Notable for the following components:      Result Value   Chloride 97 (*)    Glucose, Bld 407 (*)    Creatinine, Ser 1.02 (*)    All other components within normal limits  HEPATIC FUNCTION PANEL - Abnormal; Notable for the following components:   AST 14 (*)    All other components within normal limits  URINALYSIS, W/ REFLEX TO CULTURE (INFECTION SUSPECTED) - Abnormal; Notable for the following components:   Specific Gravity, Urine 1.031 (*)    Glucose, UA >1,000 (*)     All other components within normal limits  CBG MONITORING, ED - Abnormal; Notable for the following components:   Glucose-Capillary 410 (*)    All other components within normal limits  CBG MONITORING, ED - Abnormal; Notable for the following components:   Glucose-Capillary 356 (*)    All other components within normal limits  CBG MONITORING, ED - Abnormal; Notable for the following components:   Glucose-Capillary 285 (*)    All other components within normal limits  CBG MONITORING, ED - Abnormal; Notable for the following components:   Glucose-Capillary 155 (*)    All other components within normal limits  CBC  LIPASE, BLOOD  TROPONIN I (HIGH SENSITIVITY)    EKG EKG Interpretation Date/Time:  Thursday April 23 2023 13:07:35 EST Ventricular Rate:  70 PR Interval:  166 QRS Duration:  94 QT Interval:  432 QTC Calculation: 466 R Axis:   111  Text Interpretation: Normal sinus rhythm with sinus arrhythmia Right axis deviation Possible Anterior infarct , age undetermined Abnormal ECG When compared with ECG of 13-Jun-2022 10:53, Premature ventricular complexes are no longer Present QRS axis Shifted right T wave inversion now evident in Inferior leads T wave inversion now evident in Lateral leads Confirmed by Linwood Dibbles [phone]) on 04/23/2023 1:23:29 PM  Radiology CT ABDOMEN PELVIS W CONTRAST Result Date: 04/23/2023 CLINICAL DATA:  LUQ abdominal pain. Chest pain. Shortness of breath. EXAM: CT ABDOMEN AND PELVIS WITH CONTRAST TECHNIQUE: Multidetector CT imaging of the abdomen and pelvis was performed using the standard protocol following bolus administration of intravenous contrast. RADIATION DOSE REDUCTION: This exam was performed according to the departmental dose-optimization program which includes automated exposure control, adjustment of the mA and/or kV according to patient size and/or use of iterative reconstruction technique. CONTRAST:  OMNIPAQUE IOHEXOL 300 MG/ML  SOLN  COMPARISON:  CT scan abdomen and pelvis from 05/05/2020. FINDINGS: Lower chest: The lung bases are clear. No pleural effusion. The heart is normal in size. No pericardial effusion. Hepatobiliary: The liver is normal in size. Non-cirrhotic configuration. No suspicious mass. No intrahepatic or extrahepatic bile duct dilation. No calcified gallstones. Normal gallbladder wall thickness. No pericholecystic inflammatory changes. Pancreas: Unremarkable. No pancreatic ductal dilatation or surrounding inflammatory changes. Spleen: Within normal limits. No focal lesion. Adrenals/Urinary Tract: Adrenal glands are unremarkable. No suspicious renal mass. No hydronephrosis. No renal or ureteric calculi. Urinary bladder is under distended, precluding optimal assessment. However, no large mass or stones identified. No perivesical fat stranding. Stomach/Bowel: No disproportionate dilation of the small or large bowel loops. No evidence of abnormal bowel wall thickening or inflammatory changes. The appendix  is unremarkable. Vascular/Lymphatic: No ascites or pneumoperitoneum. No abdominal or pelvic lymphadenopathy, by size criteria. No aneurysmal dilation of the major abdominal arteries. Reproductive: The uterus is surgically absent. No large adnexal mass. Other: There is a tiny fat containing umbilical hernia. The soft tissues and abdominal wall are otherwise unremarkable. Musculoskeletal: No suspicious osseous lesions. IMPRESSION: *No acute inflammatory process identified within the abdomen or pelvis. *Multiple other nonacute observations, as described above. Electronically Signed   By: Jules Schick M.D.   On: 04/23/2023 17:30   DG Chest 2 View Result Date: 04/23/2023 CLINICAL DATA:  Chest pain. EXAM: CHEST - 2 VIEW COMPARISON:  Chest radiograph dated 08/03/2021. FINDINGS: Stable cardiomegaly with mild vascular congestion. No focal consolidation, pleural effusion, or pneumothorax. No acute osseous pathology. IMPRESSION:  Cardiomegaly with mild vascular congestion. Electronically Signed   By: Elgie Collard M.D.   On: 04/23/2023 14:35    Procedures Procedures    Medications Ordered in ED Medications  insulin aspart (novoLOG) injection 8 Units (8 Units Intravenous Given 04/23/23 1649)  iohexol (OMNIPAQUE) 300 MG/ML solution 100 mL (100 mLs Intravenous Contrast Given 04/23/23 1706)  insulin aspart (novoLOG) injection 10 Units (10 Units Intravenous Given 04/23/23 1836)    ED Course/ Medical Decision Making/ A&P                                 Medical Decision Making Amount and/or Complexity of Data Reviewed Labs: ordered. Radiology: ordered.  Risk Prescription drug management.   Patient upper abdominal pain.  Has had for a while now.  Has had some urinary frequency.  Rib pain in the abdomen pain states he feels swollen in the back.  Differential diagnosis along with includes causes such as urinary tract infection.  Pneumonia.  X-ray reassuring.  Blood work reassuring except for hyperglycemia.  With further questioning she states that she has had herpes outbreak and has had some difficulty urinating because of that.  Hyperglycemia was not associated DKA.  Given insulin with improvement of sugar.  Will need to follow-up with PCP for adjustment of medication regimen.  Will discharge home        Final Clinical Impression(s) / ED Diagnoses Final diagnoses:  Pain of upper abdomen  Hyperglycemia    Rx / DC Orders ED Discharge Orders     None         Benjiman Core, MD 04/23/23 2018
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
## 2023-05-26 ENCOUNTER — Ambulatory Visit: Payer: Self-pay | Admitting: Internal Medicine

## 2023-05-26 NOTE — Telephone Encounter (Signed)
  Chief Complaint: high blood sugar Symptoms: SOB when climbing stairs, nausea off and on Frequency: several months Pertinent Negatives: Patient denies vomiting, fever, dizziness, weakness Disposition: [] ED /[] Urgent Care (no appt availability in office) / [] Appointment(In office/virtual)/ []  [facility]/ [x] Home Care/ [x] Refused Recommended Disposition /[] [facility]/ []  Follow-up with PCP Additional Notes: Patient calls reporting consistently high blood sugar readings for several months. Patient states she is using her deceased mom's Novolog and self adjusting to try to control blood sugars. She reports she is also using lantus but is on last vial. She states she has been using sensors she purchased at an auction to monitor her blood sugars, but has no prescription for sensors. Per protocol, home care appropriate. Patient is requesting appt with PCP, no availability within two weeks. Patient is inquiring if she can be seen by PharmD. Care advice reviewed, patient verbalized understanding and denies further questions at this time. Advised to monitor for worsening signs and symptoms and call back with any changes. Alerting PCP for review and follow up.    Reason for Disposition  [1] Blood glucose > 300 mg/dL (19.1 mmol/L) AND [4] uses insulin (e.g., insulin-dependent, all people with type 1 diabetes)  Answer Assessment - Initial Assessment Questions 1. BLOOD GLUCOSE: "What is your blood glucose level?"      327 this morning, 253 last night at dinner 2. ONSET: "When did you check the blood glucose?"     Waking up this morning around 0800, reports she is checking once a day currently 3. USUAL RANGE: "What is your glucose level usually?" (e.g., usual fasting morning value, usual evening value)     Last several months 250-450 consistently 4. KETONES: "Do you check for ketones (urine or blood test strips)?" If Yes, ask: "What does the test show now?"      Denies 5. TYPE 1 or  2:  "Do you know what type of diabetes you have?"  (e.g., Type 1, Type 2, Gestational; doesn't know)      Type 2 6. INSULIN: "Do you take insulin?" "What type of insulin(s) do you use? What is the mode of delivery? (syringe, pen; injection or pump)?"      Using her moms Novalog pens and Lantus, states she is self adjusting dosage 7. DIABETES PILLS: "Do you take any pills for your diabetes?" If Yes, ask: "Have you missed taking any pills recently?"     No orals noted by patient 8. OTHER SYMPTOMS: "Do you have any symptoms?" (e.g., fever, frequent urination, difficulty breathing, dizziness, weakness, vomiting)     SOB when climbing stairs, nausea more than a couple of weeks off and on. 9. PREGNANCY: "Is there any chance you are pregnant?" "When was your last menstrual period?"     NA  Protocols used: Diabetes - High Blood Sugar-A-AH

## 2023-05-26 NOTE — Telephone Encounter (Signed)
 This RN attempted to contact patient for triage. No answer, voicemail left requesting return call to clinic.

## 2023-05-27 NOTE — Telephone Encounter (Signed)
 See pt's message. Try to move up appt with me or get her in with any available provider.

## 2023-05-27 NOTE — Telephone Encounter (Signed)
 Patient is going the [facility] today to be assessed by provider.

## 2023-05-28 ENCOUNTER — Telehealth: Payer: Self-pay | Admitting: Physician Assistant

## 2023-05-28 ENCOUNTER — Encounter: Payer: Self-pay | Admitting: Physician Assistant

## 2023-05-28 ENCOUNTER — Other Ambulatory Visit: Payer: Self-pay

## 2023-05-28 DIAGNOSIS — E1165 Type 2 diabetes mellitus with hyperglycemia: Secondary | ICD-10-CM

## 2023-05-28 DIAGNOSIS — Z794 Long term (current) use of insulin: Secondary | ICD-10-CM

## 2023-05-28 DIAGNOSIS — J4520 Mild intermittent asthma, uncomplicated: Secondary | ICD-10-CM

## 2023-05-28 DIAGNOSIS — E1169 Type 2 diabetes mellitus with other specified complication: Secondary | ICD-10-CM

## 2023-05-28 DIAGNOSIS — Z7984 Long term (current) use of oral hypoglycemic drugs: Secondary | ICD-10-CM

## 2023-05-28 DIAGNOSIS — F330 Major depressive disorder, recurrent, mild: Secondary | ICD-10-CM

## 2023-05-28 DIAGNOSIS — E785 Hyperlipidemia, unspecified: Secondary | ICD-10-CM

## 2023-05-28 DIAGNOSIS — Z7985 Long-term (current) use of injectable non-insulin antidiabetic drugs: Secondary | ICD-10-CM

## 2023-05-28 DIAGNOSIS — I10 Essential (primary) hypertension: Secondary | ICD-10-CM

## 2023-05-28 MED ORDER — FLUOXETINE HCL 10 MG PO CAPS
10.0000 mg | ORAL_CAPSULE | Freq: Every day | ORAL | 1 refills | Status: DC
  Filled 2023-05-28: qty 30, 30d supply, fill #0

## 2023-05-28 MED ORDER — TRULICITY 1.5 MG/0.5ML ~~LOC~~ SOAJ
1.5000 mg | SUBCUTANEOUS | 4 refills | Status: DC
  Filled 2023-05-28: qty 2, 28d supply, fill #0
  Filled 2023-07-24: qty 2, 28d supply, fill #1

## 2023-05-28 MED ORDER — FUROSEMIDE 20 MG PO TABS
20.0000 mg | ORAL_TABLET | Freq: Every day | ORAL | 1 refills | Status: DC
Start: 2023-05-28 — End: 2023-09-21
  Filled 2023-05-28: qty 30, 30d supply, fill #0
  Filled 2023-08-06: qty 30, 30d supply, fill #1

## 2023-05-28 MED ORDER — ATORVASTATIN CALCIUM 20 MG PO TABS
20.0000 mg | ORAL_TABLET | Freq: Every day | ORAL | 1 refills | Status: DC
  Filled 2023-05-28: qty 30, 30d supply, fill #0
  Filled 2023-08-06: qty 30, 30d supply, fill #1

## 2023-05-28 MED ORDER — LISINOPRIL-HYDROCHLOROTHIAZIDE 20-12.5 MG PO TABS
3.0000 | ORAL_TABLET | Freq: Every day | ORAL | 1 refills | Status: DC
  Filled 2023-05-28: qty 90, 30d supply, fill #0
  Filled 2023-08-27: qty 90, 30d supply, fill #1

## 2023-05-28 MED ORDER — GLIPIZIDE 5 MG PO TABS
5.0000 mg | ORAL_TABLET | Freq: Two times a day (BID) | ORAL | 1 refills | Status: DC
  Filled 2023-05-28: qty 60, 30d supply, fill #0
  Filled 2023-08-06: qty 60, 30d supply, fill #1

## 2023-05-28 MED ORDER — TRUEPLUS PEN NEEDLES 32G X 4 MM MISC
11 refills | Status: AC
  Filled 2023-05-28: qty 100, 100d supply, fill #0
  Filled 2024-02-08: qty 100, 100d supply, fill #1

## 2023-05-28 MED ORDER — AMLODIPINE BESYLATE 10 MG PO TABS
10.0000 mg | ORAL_TABLET | Freq: Every day | ORAL | 1 refills | Status: DC
Start: 2023-05-28 — End: 2023-09-21
  Filled 2023-05-28: qty 30, 30d supply, fill #0
  Filled 2023-08-06: qty 30, 30d supply, fill #1

## 2023-05-28 MED ORDER — ALBUTEROL SULFATE HFA 108 (90 BASE) MCG/ACT IN AERS
2.0000 | INHALATION_SPRAY | Freq: Four times a day (QID) | RESPIRATORY_TRACT | 1 refills | Status: AC | PRN
  Filled 2023-05-28: qty 6.7, 25d supply, fill #0
  Filled 2024-02-08: qty 6.7, 25d supply, fill #1

## 2023-05-28 MED ORDER — INSULIN GLARGINE 100 UNIT/ML SOLOSTAR PEN
35.0000 [IU] | PEN_INJECTOR | Freq: Every day | SUBCUTANEOUS | 2 refills | Status: DC
  Filled 2023-05-28: qty 12, 34d supply, fill #0
  Filled 2023-07-24: qty 12, 34d supply, fill #1
  Filled 2023-09-17: qty 12, 34d supply, fill #2

## 2023-05-28 NOTE — Progress Notes (Signed)
 Established Patient Office Visit  Subjective   Patient ID: Julia Dunn, female    DOB: 1971-03-01  Age: 53 y.o. MRN: [id number]  Chief Complaint  Patient presents with   Diabetes   Virtual Visit via Video Note  I connected with [name] on 05/28/23 at  9:00 AM EST by a video enabled telemedicine application and verified that I am speaking with the correct person using two identifiers.  Location: Patient: Home  Provider: [facility]    I discussed the limitations of evaluation and management by telemedicine and the availability of in person appointments. The patient expressed understanding and agreed to proceed.  History of Present Illness:  States that she has been experiencing elevated fasting blood glucose readings.  States this morning it was 272.  States that she has not had any readings under 200 in the past few months.  States that approximately 30 days ago she increased her Lantus on her own to 35 units.  States that she has been out of Trulicity due to lack of follow-up.  States that she has been taking her blood pressure medication as directed, however has not taken it yet today.  States that she is working on decreasing energy drinks and stays mindful of sodium intake.  States that her blood pressure this morning during this virtual visit is at 192/98.  Observations/Objective:  Medical history and current medications reviewed, no physical exam completed     Past Medical History:  Diagnosis Date   Anemia Dx 2013   Asthma    Bacterial vaginosis 2008   Complication of anesthesia    pt states was informed had difficult time awakening following hysterectomy    Depression    Depression    Diabetes mellitus Dx 2005   Fibroids 09/05/2011   08/08/2011: Anteverted uterus. Diffuse fibroid involvement. Left of midline fibroid with submucosal component. 3.4cm x 2.8cmx 3.4cm.    GERD (gastroesophageal reflux disease)    Gestational diabetes    H/O  shortness of breath    pt states has improved since lossing weight; triggers currently are enviromental pt states can make it to top of stairs w/o having to stop to catch her breath   Headache(784.0)    migraines   History of blood transfusion 2013/2014   History of bronchitis    Hx of blood clots    Hypertension Dx 2015   Kidney infection 2015   Menorrhagia 09/27/2010   Pneumonia    Postpartum depression    Pregnancy induced hypertension    Sleep apnea    mild, no CPAP recommended   Trichomonas    Social History   Socioeconomic History   Marital status: Significant Other    Spouse name: Not on file   Number of children: 3   Years of education: Not on file   Highest education level: Not on file  Occupational History   Occupation: Unemployed  Tobacco Use   Smoking status: Never   Smokeless tobacco: Never  Substance and Sexual Activity   Alcohol use: Yes    Comment: social   Drug use: No   Sexual activity: Yes    Birth control/protection: Surgical    Comment: hysterectomy  Other Topics Concern   Not on file  Social History Narrative   Not on file   Social Drivers of Health   Financial Resource Strain: Not on file  Food Insecurity: Not on file  Transportation Needs: Not on file  Physical Activity: Not on file  Stress: Not on file  Social Connections: Unknown (06/16/2022)   [facility] from [facility]   Social Network    Social Network: Not on file  Intimate Partner Violence: Unknown (06/16/2022)   Received from [facility], [facility]   HITS    Physically Hurt: Not on file    Insult or Talk Down To: Not on file    Threaten Physical Harm: Not on file    Scream or Curse: Not on file   Family History  Problem Relation Age of Onset   Hypertension Brother    Stroke Brother    Sickle cell trait Daughter    Hypertension Father    Heart disease Mother    Rheumatic fever Mother    Hypertension Mother    Stroke Mother    CAD Mother    Heart  disease Paternal Aunt    Allergies  Allergen Reactions   Metformin And Related Other (See Comments)    GI upset even with low dose.    Review of Systems  Constitutional: Negative.   HENT: Negative.    Eyes: Negative.   Cardiovascular:  Negative for chest pain.  Gastrointestinal: Negative.   Genitourinary: Negative.   Musculoskeletal: Negative.   Skin: Negative.   Neurological: Negative.   Endo/Heme/Allergies: Negative.   Psychiatric/Behavioral: Negative.        Assessment & Plan:   Problem List Items Addressed This Visit   None Visit Diagnoses       Type 2 diabetes mellitus with morbid obesity (HCC)       Relevant Medications   glipiZIDE (GLUCOTROL) 5 MG tablet   Dulaglutide (TRULICITY) 1.5 MG/0.5ML SOAJ   lisinopril-hydrochlorothiazide (ZESTORETIC) 20-12.5 MG tablet   insulin glargine (LANTUS) 100 UNIT/ML Solostar Pen   Insulin Pen Needle (TRUEPLUS PEN NEEDLES) 32G X 4 MM MISC   atorvastatin (LIPITOR) 20 MG tablet     Hypertension associated with diabetes (HCC)       Relevant Medications   glipiZIDE (GLUCOTROL) 5 MG tablet   Dulaglutide (TRULICITY) 1.5 MG/0.5ML SOAJ   amLODipine (NORVASC) 10 MG tablet   lisinopril-hydrochlorothiazide (ZESTORETIC) 20-12.5 MG tablet   insulin glargine (LANTUS) 100 UNIT/ML Solostar Pen   furosemide (LASIX) 20 MG tablet   atorvastatin (LIPITOR) 20 MG tablet     Type 2 diabetes mellitus with obesity (HCC)       Relevant Medications   glipiZIDE (GLUCOTROL) 5 MG tablet   Dulaglutide (TRULICITY) 1.5 MG/0.5ML SOAJ   lisinopril-hydrochlorothiazide (ZESTORETIC) 20-12.5 MG tablet   insulin glargine (LANTUS) 100 UNIT/ML Solostar Pen   atorvastatin (LIPITOR) 20 MG tablet     Hyperlipidemia due to type 2 diabetes mellitus (HCC)       Relevant Medications   glipiZIDE (GLUCOTROL) 5 MG tablet   Dulaglutide (TRULICITY) 1.5 MG/0.5ML SOAJ   amLODipine (NORVASC) 10 MG tablet   lisinopril-hydrochlorothiazide (ZESTORETIC) 20-12.5 MG tablet    insulin glargine (LANTUS) 100 UNIT/ML Solostar Pen   furosemide (LASIX) 20 MG tablet   atorvastatin (LIPITOR) 20 MG tablet     Mild recurrent major depression (HCC)       Relevant Medications   FLUoxetine (PROZAC) 10 MG capsule     Viral respiratory illness       Relevant Medications   albuterol (VENTOLIN HFA) 108 (90 Base) MCG/ACT inhaler     1. Type 2 diabetes mellitus with hyperglycemia, with long-term current use of insulin (HCC) (Primary) Patient has increased lantus to 35mg , restart trulicity, trial glipizide.  Patient to follow up  with the mobile unit in 2 weeks.  Red flags given for prompt reevaluation. - glipiZIDE (GLUCOTROL) 5 MG tablet; Take 1 tablet (5 mg total) by mouth 2 (two) times daily before a meal.  Dispense: 60 tablet; Refill: 1 - Dulaglutide (TRULICITY) 1.5 MG/0.5ML SOAJ; Inject 1.5 mg into the skin once a week.  Dispense: 2 mL; Refill: 4 - insulin glargine (LANTUS) 100 UNIT/ML Solostar Pen; Inject 35 Units into the skin at bedtime.  Dispense: 15 mL; Refill: 2 - Insulin Pen Needle (TRUEPLUS PEN NEEDLES) 32G X 4 MM MISC; Use to inject insulin daily.  Dispense: 100 each; Refill: 11  2. Essential hypertension Increase zestoretic to 3 tablets , check BP on a daily basis, keep written log and have available for all  - amLODipine (NORVASC) 10 MG tablet; Take 1 tablet (10 mg total) by mouth daily.  Dispense: 30 tablet; Refill: 1 - lisinopril-hydrochlorothiazide (ZESTORETIC) 20-12.5 MG tablet; Take 3 tablets by mouth daily.  Dispense: 90 tablet; Refill: 1 - furosemide (LASIX) 20 MG tablet; Take 1 tablet (20 mg total) by mouth daily.  Dispense: 30 tablet; Refill: 1  3. Hyperlipidemia due to type 2 diabetes mellitus (HCC) Continue current regimen - atorvastatin (LIPITOR) 20 MG tablet; Take 1 tablet (20 mg total) by mouth daily after supper.  Dispense: 30 tablet; Refill: 1  4. Mild recurrent major depression (HCC) Continue current regimen - FLUoxetine (PROZAC) 10 MG capsule;  Take 1 capsule (10 mg total) by mouth daily.  Dispense: 30 capsule; Refill: 1  5. Mild intermittent asthma without complication Continue current regimen - albuterol (VENTOLIN HFA) 108 (90 Base) MCG/ACT inhaler; Inhale 2 puffs into the lungs every 6 (six) hours as needed for wheezing or shortness of breath.  Dispense: 6.7 g; Refill: 1   Follow Up Instructions:    I discussed the assessment and treatment plan with the patient. The patient was provided an opportunity to ask questions and all were answered. The patient agreed with the plan and demonstrated an understanding of the instructions.   The patient was advised to call back or seek an in-person evaluation if the symptoms worsen or if the condition fails to improve as anticipated.  I provided 20 minutes of non-face-to-face time during this encounter.     No follow-ups on file.    Kasandra Knudsen Mayers, PA-C

## 2023-05-28 NOTE — Patient Instructions (Signed)
 Current Medications:  Current Outpatient Medications:    glipiZIDE (GLUCOTROL) 5 MG tablet, Take 1 tablet (5 mg total) by mouth 2 (two) times daily before a meal., Disp: 60 tablet, Rfl: 1   albuterol (VENTOLIN HFA) 108 (90 Base) MCG/ACT inhaler, Inhale 2 puffs into the lungs every 6 (six) hours as needed for wheezing or shortness of breath., Disp: 6.7 g, Rfl: 1   amLODipine (NORVASC) 10 MG tablet, Take 1 tablet (10 mg total) by mouth daily., Disp: 30 tablet, Rfl: 1   atorvastatin (LIPITOR) 20 MG tablet, Take 1 tablet (20 mg total) by mouth daily after supper., Disp: 30 tablet, Rfl: 1   diclofenac Sodium (VOLTAREN) 1 % GEL, Apply 4 g topically 4 (four) times daily as needed., Disp: 500 g, Rfl: 6   Dulaglutide (TRULICITY) 1.5 MG/0.5ML SOAJ, Inject 1.5 mg into the skin once a week., Disp: 2 mL, Rfl: 4   FLUoxetine (PROZAC) 10 MG capsule, Take 1 capsule (10 mg total) by mouth daily., Disp: 30 capsule, Rfl: 1   furosemide (LASIX) 20 MG tablet, Take 1 tablet (20 mg total) by mouth daily., Disp: 30 tablet, Rfl: 1   HYDROcodone-acetaminophen (NORCO) 5-325 MG tablet, Take 1 tablet by mouth every 6 (six) hours as needed., Disp: 20 tablet, Rfl: 0   insulin glargine (LANTUS) 100 UNIT/ML Solostar Pen, Inject 35 Units into the skin at bedtime., Disp: 15 mL, Rfl: 2   Insulin Pen Needle (TRUEPLUS PEN NEEDLES) 32G X 4 MM MISC, Use to inject insulin daily., Disp: 100 each, Rfl: 11   lisinopril-hydrochlorothiazide (ZESTORETIC) 20-12.5 MG tablet, Take 3 tablets by mouth daily., Disp: 90 tablet, Rfl: 1   omeprazole (PRILOSEC) 20 MG capsule, TAKE 1 CAPSULE (20 MG TOTAL) BY MOUTH DAILY., Disp: 30 capsule, Rfl: 0   ONE TOUCH ULTRA TEST test strip, USE  STRIP TO CHECK GLUCOSE THREE TIMES DAILY BEFORE MEAL(S), Disp: 100 each, Rfl: 5   Medications ordered in this encounter:  Meds ordered this encounter  Medications   glipiZIDE (GLUCOTROL) 5 MG tablet    Sig: Take 1 tablet (5 mg total) by mouth 2 (two) times daily  before a meal.    Dispense:  60 tablet    Refill:  1    Supervising Provider:   Quentin Angst [[id number]]   Dulaglutide (TRULICITY) 1.5 MG/0.5ML SOAJ    Sig: Inject 1.5 mg into the skin once a week.    Dispense:  2 mL    Refill:  4    Supervising Provider:   Quentin Angst [[id number]]   amLODipine (NORVASC) 10 MG tablet    Sig: Take 1 tablet (10 mg total) by mouth daily.    Dispense:  30 tablet    Refill:  1    Supervising Provider:   Quentin Angst [[id number]]   lisinopril-hydrochlorothiazide (ZESTORETIC) 20-12.5 MG tablet    Sig: Take 3 tablets by mouth daily.    Dispense:  90 tablet    Refill:  1    Dose change    Supervising Provider:   Quentin Angst [[id number]]   insulin glargine (LANTUS) 100 UNIT/ML Solostar Pen    Sig: Inject 35 Units into the skin at bedtime.    Dispense:  15 mL    Refill:  2    Dose change    Supervising Provider:   Quentin Angst [[id number]]   Insulin Pen Needle (TRUEPLUS PEN NEEDLES) 32G X 4 MM MISC    Sig: Use to inject insulin daily.  Dispense:  100 each    Refill:  11    Supervising Provider:   Quentin Angst [[id number]]   furosemide (LASIX) 20 MG tablet    Sig: Take 1 tablet (20 mg total) by mouth daily.    Dispense:  30 tablet    Refill:  1    Supervising Provider:   Quentin Angst [[id number]]   atorvastatin (LIPITOR) 20 MG tablet    Sig: Take 1 tablet (20 mg total) by mouth daily after supper.    Dispense:  30 tablet    Refill:  1    Supervising Provider:   Quentin Angst [[id number]]   FLUoxetine (PROZAC) 10 MG capsule    Sig: Take 1 capsule (10 mg total) by mouth daily.    Dispense:  30 capsule    Refill:  1    Supervising Provider:   Quentin Angst [[id number]]   albuterol (VENTOLIN HFA) 108 (90 Base) MCG/ACT inhaler    Sig: Inhale 2 puffs into the lungs every 6 (six) hours as needed for wheezing or shortness of breath.    Dispense:  6.7 g    Refill:  1    Supervising Provider:   Quentin Angst [[id number]]     *If you need refills on other medications prior to your next appointment, please contact your pharmacy*  Follow-Up: Call back or seek an in-person evaluation if the symptoms worsen or if the condition fails to improve as anticipated.  [facility] [phone]  Other Instructions Please follow up with Mobile Unit on 3/19 at 9am.  Please record your blood pressure and blood glucose readings for review during that visit.  Please prepare to have fasting labs completed.  How to Take Your Blood Pressure Blood pressure is a measurement of how strongly your blood is pressing against the walls of your arteries. Arteries are blood vessels that carry blood from your heart throughout your body. Your health care provider takes your blood pressure at each office visit. You can also take your own blood pressure at home with a blood pressure monitor. You may need to take your own blood pressure to: Confirm a diagnosis of high blood pressure (hypertension). Monitor your blood pressure over time. Make sure your blood pressure medicine is working. Supplies needed: Blood pressure monitor. A chair to sit in. This should be a chair where you can sit upright with your back supported. Do not sit on a soft couch or an armchair. Table or desk. Small notebook and pencil or pen. How to prepare To get the most accurate reading, avoid the following for 30 minutes before you check your blood pressure: Drinking caffeine. Drinking alcohol. Eating. Smoking. Exercising. Five minutes before you check your blood pressure: Use the bathroom and urinate so that you have an empty bladder. Sit quietly in a chair. Do not talk. How to take your blood pressure To check your blood pressure, follow the instructions in the manual that came with your blood pressure monitor. If you have a digital blood pressure monitor, the instructions may be as follows: Sit up straight in a  chair. Place your feet on the floor. Do not cross your ankles or legs. Rest your left arm at the level of your heart on a table or desk or on the arm of a chair. Pull up your shirt sleeve. Wrap the blood pressure cuff around the upper part of your left arm, 1 inch (2.5 cm) above your elbow. It is  best to wrap the cuff around bare skin. Fit the cuff snugly, but not too tightly, around your arm. You should be able to place only one finger between the cuff and your arm. Position the cord so that it rests in the bend of your elbow. Press the power button. Sit quietly while the cuff inflates and deflates. Read the digital reading on the monitor screen and write the numbers down (record them) in a notebook. Wait 2-3 minutes, then repeat the steps, starting at step 1. What does my blood pressure reading mean? A blood pressure reading consists of a higher number over a lower number. Ideally, your blood pressure should be below 120/80. The first ("top") number is called the systolic pressure. It is a measure of the pressure in your arteries as your heart beats. The second ("bottom") number is called the diastolic pressure. It is a measure of the pressure in your arteries as the heart relaxes. Blood pressure is classified into four stages. The following are the stages for adults who do not have a short-term serious illness or a chronic condition. Systolic pressure and diastolic pressure are measured in a unit called mm Hg (millimeters of mercury).  Normal Systolic pressure: below 120. Diastolic pressure: below 80. Elevated Systolic pressure: 120-129. Diastolic pressure: below 80. Hypertension stage 1 Systolic pressure: 130-139. Diastolic pressure: 80-89. Hypertension stage 2 Systolic pressure: 140 or above. Diastolic pressure: 90 or above. You can have elevated blood pressure or hypertension even if only the systolic or only the diastolic number in your reading is higher than normal. Follow these  instructions at home: Medicines Take over-the-counter and prescription medicines only as told by your health care provider. Tell your health care provider if you are having any side effects from blood pressure medicine. General instructions Check your blood pressure as often as recommended by your health care provider. Check your blood pressure at the same time every day. Take your monitor to the next appointment with your health care provider to make sure that: You are using it correctly. It provides accurate readings. Understand what your goal blood pressure numbers are. Keep all follow-up visits. This is important. General tips Your health care provider can suggest a reliable monitor that will meet your needs. There are several types of home blood pressure monitors. Choose a monitor that has an arm cuff. Do not choose a monitor that measures your blood pressure from your wrist or finger. Choose a cuff that wraps snugly, not too tight or too loose, around your upper arm. You should be able to fit only one finger between your arm and the cuff. You can buy a blood pressure monitor at most drugstores or online. Where to find more information American Heart Association: www.heart.org Contact a health care provider if: Your blood pressure is consistently high. Your blood pressure is suddenly low. Get help right away if: Your systolic blood pressure is higher than 180. Your diastolic blood pressure is higher than 120. These symptoms may be an emergency. Get help right away. Call 911. Do not wait to see if the symptoms will go away. Do not drive yourself to the hospital. Summary Blood pressure is a measurement of how strongly your blood is pressing against the walls of your arteries. A blood pressure reading consists of a higher number over a lower number. Ideally, your blood pressure should be below 120/80. Check your blood pressure at the same time every day. Avoid caffeine, alcohol,  smoking, and exercise for 30 minutes prior  to checking your blood pressure. These agents can affect the accuracy of the blood pressure reading. This information is not intended to replace advice given to you by your health care provider. Make sure you discuss any questions you have with your health care provider. Document Revised: 11/29/2020 Document Reviewed: 11/29/2020 Elsevier Patient Education  2024 [employer].

## 2023-06-02 ENCOUNTER — Other Ambulatory Visit: Payer: Self-pay

## 2023-06-04 ENCOUNTER — Other Ambulatory Visit: Payer: Self-pay

## 2023-06-10 DIAGNOSIS — Z79899 Other long term (current) drug therapy: Secondary | ICD-10-CM | POA: Insufficient documentation

## 2023-06-10 DIAGNOSIS — Z794 Long term (current) use of insulin: Secondary | ICD-10-CM | POA: Insufficient documentation

## 2023-06-10 DIAGNOSIS — E119 Type 2 diabetes mellitus without complications: Secondary | ICD-10-CM | POA: Insufficient documentation

## 2023-06-10 DIAGNOSIS — J168 Pneumonia due to other specified infectious organisms: Secondary | ICD-10-CM | POA: Insufficient documentation

## 2023-06-10 DIAGNOSIS — I10 Essential (primary) hypertension: Secondary | ICD-10-CM | POA: Insufficient documentation

## 2023-06-11 ENCOUNTER — Emergency Department (HOSPITAL_COMMUNITY): Payer: Self-pay

## 2023-06-11 ENCOUNTER — Emergency Department (HOSPITAL_COMMUNITY)
Admission: EM | Admit: 2023-06-11 | Discharge: 2023-06-11 | Disposition: A | Discharge status: Home / Self Care | Payer: Self-pay | Attending: Emergency Medicine | Admitting: Emergency Medicine

## 2023-06-11 ENCOUNTER — Encounter (HOSPITAL_COMMUNITY): Payer: Self-pay | Admitting: *Deleted

## 2023-06-11 ENCOUNTER — Other Ambulatory Visit: Payer: Self-pay

## 2023-06-11 DIAGNOSIS — J189 Pneumonia, unspecified organism: Secondary | ICD-10-CM

## 2023-06-11 LAB — CBC
HCT: 43.8 % (ref 36.0–46.0)
Hemoglobin: 14.5 g/dL (ref 12.0–15.0)
MCH: 28.8 pg (ref 26.0–34.0)
MCHC: 33.1 g/dL (ref 30.0–36.0)
MCV: 86.9 fL (ref 80.0–100.0)
Platelets: 175 10*3/uL (ref 150–400)
RBC: 5.04 MIL/uL (ref 3.87–5.11)
RDW: 12.8 % (ref 11.5–15.5)
WBC: 15.1 10*3/uL — ABNORMAL HIGH (ref 4.0–10.5)
nRBC: 0 % (ref 0.0–0.2)

## 2023-06-11 LAB — COMPREHENSIVE METABOLIC PANEL
ALT: 43 U/L (ref 0–44)
AST: 28 U/L (ref 15–41)
Albumin: 3.5 g/dL (ref 3.5–5.0)
Alkaline Phosphatase: 54 U/L (ref 38–126)
Anion gap: 14 (ref 5–15)
BUN: 7 mg/dL (ref 6–20)
CO2: 25 mmol/L (ref 22–32)
Calcium: 8.9 mg/dL (ref 8.9–10.3)
Chloride: 100 mmol/L (ref 98–111)
Creatinine, Ser: 0.76 mg/dL (ref 0.44–1.00)
GFR, Estimated: >60 mL/min (ref >60)
Glucose, Bld: 228 mg/dL — ABNORMAL HIGH (ref 70–99)
Potassium: 3.6 mmol/L (ref 3.5–5.1)
Sodium: 139 mmol/L (ref 135–145)
Total Bilirubin: 1.6 mg/dL — ABNORMAL HIGH (ref 0.0–1.2)
Total Protein: 6.9 g/dL (ref 6.5–8.1)

## 2023-06-11 MED ORDER — DOXYCYCLINE HYCLATE 100 MG PO CAPS
100.0000 mg | ORAL_CAPSULE | Freq: Two times a day (BID) | ORAL | 0 refills | Status: AC
  Filled 2023-06-11: qty 10, 5d supply, fill #0

## 2023-06-11 MED ORDER — DOXYCYCLINE HYCLATE 100 MG PO TABS
100.0000 mg | ORAL_TABLET | Freq: Once | ORAL | Status: AC
  Administered 2023-06-11: 100 mg via ORAL
  Filled 2023-06-11: qty 1

## 2023-06-11 MED ORDER — BENZONATATE 100 MG PO CAPS
100.0000 mg | ORAL_CAPSULE | Freq: Three times a day (TID) | ORAL | 0 refills | Status: DC
  Filled 2023-06-11: qty 21, 7d supply, fill #0

## 2023-06-11 MED ORDER — ACETAMINOPHEN 500 MG PO TABS
1000.0000 mg | ORAL_TABLET | Freq: Once | ORAL | Status: AC
  Administered 2023-06-11: 1000 mg via ORAL
  Filled 2023-06-11: qty 2

## 2023-06-11 MED ORDER — IPRATROPIUM-ALBUTEROL 0.5-2.5 (3) MG/3ML IN SOLN
3.0000 mL | Freq: Once | RESPIRATORY_TRACT | Status: AC
  Administered 2023-06-11: 3 mL via RESPIRATORY_TRACT
  Filled 2023-06-11: qty 3

## 2023-06-11 MED ORDER — AMOXICILLIN-POT CLAVULANATE 875-125 MG PO TABS
1.0000 | ORAL_TABLET | Freq: Two times a day (BID) | ORAL | 0 refills | Status: AC
  Filled 2023-06-11: qty 10, 5d supply, fill #0

## 2023-06-11 MED ORDER — AMOXICILLIN-POT CLAVULANATE 875-125 MG PO TABS
1.0000 | ORAL_TABLET | Freq: Once | ORAL | Status: AC
  Administered 2023-06-11: 1 via ORAL
  Filled 2023-06-11: qty 1

## 2023-06-11 MED ORDER — BENZONATATE 100 MG PO CAPS
100.0000 mg | ORAL_CAPSULE | Freq: Once | ORAL | Status: AC
  Administered 2023-06-11: 100 mg via ORAL
  Filled 2023-06-11: qty 1

## 2023-06-11 NOTE — Discharge Instructions (Addendum)
 Please follow-up with your primary care provider in regards to recent ER visit.  Today your x-ray shows that you have pneumonia which is causing your symptoms.  Please take Tylenol every 6 hours needed for pain and antibiotics I prescribed along with the Tessalon.  If symptoms change or worsen please return to the ER.

## 2023-06-11 NOTE — ED Provider Notes (Signed)
 [place] EMERGENCY DEPARTMENT AT [facility] Provider Note   CSN: [id number] Arrival date & time: 06/10/23  2358     History  Chief Complaint  Patient presents with   Cough   Shortness of Breath    Julia Dunn is a 53 y.o. female history of diabetes, hypertension presented for 10 days of productive cough.  Patient states that she is coughing up yellow sputum.  Patient states that husband and child are sick with similar symptoms.  Patient's been reinjuring the issue but states that she cannot walk without feeling short of breath due to the coughing.  Patient does endorse subjective headaches.  Patient denies chest pain.   Home Medications Prior to Admission medications   Medication Sig Start Date End Date Taking? Authorizing Provider  amoxicillin-clavulanate (AUGMENTIN) 875-125 MG tablet Take 1 tablet by mouth every 12 (twelve) hours for 5 days. 06/11/23 06/16/23 Yes Olivier Frayre, Beverly Gust, PA-C  benzonatate (TESSALON) 100 MG capsule Take 1 capsule (100 mg total) by mouth every 8 (eight) hours. 06/11/23  Yes Rollo Farquhar, Beverly Gust, PA-C  doxycycline (VIBRAMYCIN) 100 MG capsule Take 1 capsule (100 mg total) by mouth 2 (two) times daily for 5 days. 06/11/23 06/16/23 Yes Darra Rosa, Beverly Gust, PA-C  albuterol (VENTOLIN HFA) 108 (90 Base) MCG/ACT inhaler Inhale 2 puffs into the lungs every 6 (six) hours as needed for wheezing or shortness of breath. 05/28/23   Mayers, Cari S, PA-C  amLODipine (NORVASC) 10 MG tablet Take 1 tablet (10 mg total) by mouth daily. 05/28/23   Mayers, Cari S, PA-C  atorvastatin (LIPITOR) 20 MG tablet Take 1 tablet (20 mg total) by mouth daily after supper. 05/28/23   Mayers, Cari S, PA-C  diclofenac Sodium (VOLTAREN) 1 % GEL Apply 4 g topically 4 (four) times daily as needed. 03/14/19   Hilts, Casimiro Needle, MD  Dulaglutide (TRULICITY) 1.5 MG/0.5ML SOAJ Inject 1.5 mg into the skin once a week. 05/28/23   Mayers, Cari S, PA-C  FLUoxetine (PROZAC) 10 MG capsule Take 1 capsule (10 mg  total) by mouth daily. 05/28/23   Mayers, Cari S, PA-C  furosemide (LASIX) 20 MG tablet Take 1 tablet (20 mg total) by mouth daily. 05/28/23   Mayers, Cari S, PA-C  glipiZIDE (GLUCOTROL) 5 MG tablet Take 1 tablet (5 mg total) by mouth 2 (two) times daily before a meal. 05/28/23   Mayers, Cari S, PA-C  HYDROcodone-acetaminophen (NORCO) 5-325 MG tablet Take 1 tablet by mouth every 6 (six) hours as needed. 11/20/21   Tarry Kos, MD  insulin glargine (LANTUS) 100 UNIT/ML Solostar Pen Inject 35 Units into the skin at bedtime. 05/28/23   Mayers, Cari S, PA-C  Insulin Pen Needle (TRUEPLUS PEN NEEDLES) 32G X 4 MM MISC Use to inject insulin daily. 05/28/23   Mayers, Cari S, PA-C  lisinopril-hydrochlorothiazide (ZESTORETIC) 20-12.5 MG tablet Take 3 tablets by mouth daily. 05/28/23   Mayers, Cari S, PA-C  omeprazole (PRILOSEC) 20 MG capsule TAKE 1 CAPSULE (20 MG TOTAL) BY MOUTH DAILY. 01/02/21 01/02/22  Marcine Matar, MD  ONE TOUCH ULTRA TEST test strip USE  STRIP TO CHECK GLUCOSE THREE TIMES DAILY BEFORE MEAL(S) 10/23/16   Hoy Register, MD      Allergies    Metformin and related    Review of Systems   Review of Systems  Respiratory:  Positive for cough and shortness of breath.     Physical Exam Updated Vital Signs BP 133/86 (BP Location: Left Arm)   Pulse 91  Temp 98.7 F (37.1 C)   Resp 15   Ht 5\' 5"  (1.651 m)   Wt 113.9 kg   LMP 06/29/2012   SpO2 91%   BMI 41.79 kg/m  Physical Exam Vitals reviewed.  Constitutional:      General: She is not in acute distress. HENT:     Head: Normocephalic and atraumatic.  Eyes:     Extraocular Movements: Extraocular movements intact.     Conjunctiva/sclera: Conjunctivae normal.     Pupils: Pupils are equal, round, and reactive to light.  Cardiovascular:     Rate and Rhythm: Normal rate and regular rhythm.     Pulses: Normal pulses.     Heart sounds: Normal heart sounds.     Comments: 2+ bilateral radial/dorsalis pedis pulses with regular  rate Pulmonary:     Effort: Pulmonary effort is normal. No respiratory distress.     Breath sounds: Normal breath sounds.     Comments: Productive cough in the room Abdominal:     Palpations: Abdomen is soft.     Tenderness: There is no abdominal tenderness. There is no guarding or rebound.  Musculoskeletal:        General: Normal range of motion.     Cervical back: Normal range of motion and neck supple.     Comments: 5 out of 5 bilateral grip/leg extension strength  Skin:    General: Skin is warm and dry.     Capillary Refill: Capillary refill takes less than 2 seconds.  Neurological:     General: No focal deficit present.     Mental Status: She is alert and oriented to person, place, and time.     Comments: Sensation intact in all 4 limbs  Psychiatric:        Mood and Affect: Mood normal.     ED Results / Procedures / Treatments   Labs (all labs ordered are listed, but only abnormal results are displayed) Labs Reviewed  COMPREHENSIVE METABOLIC PANEL - Abnormal; Notable for the following components:      Result Value   Glucose, Bld 228 (*)    Total Bilirubin 1.6 (*)    All other components within normal limits  CBC - Abnormal; Notable for the following components:   WBC 15.1 (*)    All other components within normal limits  RESP PANEL BY RT-PCR (RSV, FLU A&B, COVID)  [id number]    EKG None  Radiology DG Chest 2 View Result Date: 06/11/2023 CLINICAL DATA:  Cough, dyspnea EXAM: CHEST - 2 VIEW COMPARISON:  04/23/2023 FINDINGS: Patchy bibasilar pulmonary infiltrate has developed within the right middle lobe lingula, in keeping with changes of multifocal bronchopneumonia in the appropriate clinical setting. No pneumothorax or pleural effusion. Cardiac size within normal limits. Pulmonary vascularity is normal. No acute bone abnormality. IMPRESSION: 1. Patchy bibasilar pulmonary infiltrate, in keeping with changes of multifocal bronchopneumonia in the appropriate clinical setting.  Electronically Signed   By: Helyn Numbers M.D.   On: 06/11/2023 01:39    Procedures Procedures    Medications Ordered in ED Medications  amoxicillin-clavulanate (AUGMENTIN) 875-125 MG per tablet 1 tablet (has no administration in time range)  doxycycline (VIBRA-TABS) tablet 100 mg (has no administration in time range)  ipratropium-albuterol (DUONEB) 0.5-2.5 (3) MG/3ML nebulizer solution 3 mL (3 mLs Nebulization Given 06/11/23 0331)  acetaminophen (TYLENOL) tablet 1,000 mg (1,000 mg Oral Given 06/11/23 0328)  benzonatate (TESSALON) capsule 100 mg (100 mg Oral Given 06/11/23 0329)    ED Course/ Medical Decision  Making/ A&P                                 Medical Decision Making Risk OTC drugs. Prescription drug management.   Ari Engelbrecht 53 y.o. presented today for shortness of breath.  Working DDx that I considered at this time includes, but not limited to, asthma/COPD exacerbation, URI, viral illness, anemia, ACS, PE, pneumonia, pleural effusion, lung cancer, CHF, respiratory distress, medication side effect, intoxication.  R/o DDx: asthma/COPD exacerbation, URI, viral illness, anemia, ACS, PE, pleural effusion, lung cancer, CHF, respiratory distress, medication side effect, intoxication: These are considered less likely due to history of present illness, physical exam, labs/imaging findings  Review of prior external notes: 05/28/2023 video visit  Unique Tests and My Independent Interpretation:  CBC: Leukocytosis 15.1 BMP: Unremarkable CXR: Multifocal pneumonia Respiratory Panel: Pending  Social Determinants of Health: none  Discussion with Independent Historian: None  Discussion of Management of Tests: None  Risk: Medium: prescription drug management  Risk Stratification Score:  PORT: 29  Staffed with Long, MD  Plan: On exam patient was in no acute distress with stable vitals. Physical exam showed productive cough with yellow sputum but otherwise no other acute  findings.  Labs in triage do show leukocytosis and chest x-ray does show multifocal pneumonia.  Patient was ambulated and did not become hypoxic.  Patient given breathing treatment help with her breathing.  Patient's vitals here are reassuring at this time does not require admission.  Patient's port score is also low and outpatient treatment is reasonable using the score and so we will have her follow-up with her primary care provider.  Will give first dose antibiotics here and prescribe the rest and have her follow-up with her primary care provider.  Patient was given return precautions. Patient stable for discharge at this time.  Patient verbalized understanding of plan.  This chart was dictated using voice recognition software.  Despite best efforts to proofread,  errors can occur which can change the documentation meaning.         Final Clinical Impression(s) / ED Diagnoses Final diagnoses:  Pneumonia of both lungs due to infectious organism, unspecified part of lung    Rx / DC Orders ED Discharge Orders          Ordered    amoxicillin-clavulanate (AUGMENTIN) 875-125 MG tablet  Every 12 hours        06/11/23 0349    doxycycline (VIBRAMYCIN) 100 MG capsule  2 times daily        06/11/23 0349    benzonatate (TESSALON) 100 MG capsule  Every 8 hours        06/11/23 0349              Netta Corrigan, PA-C 06/11/23 0407    Long, Arlyss Repress, MD 06/11/23 [phone]

## 2023-06-11 NOTE — ED Notes (Signed)
 Pre-breathing treatment patient ambulated with this RN,  SpO2 remained between 92-94 with ambulation, max HR in low 100's. Demonstrated some increase work of breathing. Provider made aware.

## 2023-06-11 NOTE — ED Notes (Signed)
 Patient placed in respiratory section of waiting room.  She called her daughter for ride per pt

## 2023-06-11 NOTE — ED Triage Notes (Signed)
 The pt is c/o a  cough  with shortness of breath none now and no appetite for 12 days  unknown temp lmp none

## 2023-06-17 ENCOUNTER — Ambulatory Visit: Payer: Self-pay | Admitting: Physician Assistant

## 2023-06-25 ENCOUNTER — Telehealth: Payer: Self-pay

## 2023-06-25 NOTE — Telephone Encounter (Signed)
 Patient missed her Appointment on 03.19.2025, called to reschedule. Patient has been rescheduled for 3.31.2025 at 9am.

## 2023-06-29 ENCOUNTER — Other Ambulatory Visit: Payer: Self-pay

## 2023-06-29 ENCOUNTER — Other Ambulatory Visit (HOSPITAL_COMMUNITY)
Admission: RE | Admit: 2023-06-29 | Discharge: 2023-06-29 | Disposition: A | Discharge status: Home / Self Care | Payer: Self-pay | Source: Ambulatory Visit | Attending: Physician Assistant | Admitting: Physician Assistant

## 2023-06-29 ENCOUNTER — Ambulatory Visit: Payer: Self-pay | Admitting: Physician Assistant

## 2023-06-29 ENCOUNTER — Encounter: Payer: Self-pay | Admitting: Physician Assistant

## 2023-06-29 VITALS — BP 130/84 | HR 79 | Ht 64.0 in | Wt 236.0 lb

## 2023-06-29 DIAGNOSIS — F330 Major depressive disorder, recurrent, mild: Secondary | ICD-10-CM

## 2023-06-29 DIAGNOSIS — E785 Hyperlipidemia, unspecified: Secondary | ICD-10-CM

## 2023-06-29 DIAGNOSIS — E1169 Type 2 diabetes mellitus with other specified complication: Secondary | ICD-10-CM

## 2023-06-29 DIAGNOSIS — B379 Candidiasis, unspecified: Secondary | ICD-10-CM

## 2023-06-29 DIAGNOSIS — Z1231 Encounter for screening mammogram for malignant neoplasm of breast: Secondary | ICD-10-CM

## 2023-06-29 DIAGNOSIS — I10 Essential (primary) hypertension: Secondary | ICD-10-CM

## 2023-06-29 DIAGNOSIS — T3695XA Adverse effect of unspecified systemic antibiotic, initial encounter: Secondary | ICD-10-CM | POA: Insufficient documentation

## 2023-06-29 DIAGNOSIS — N760 Acute vaginitis: Secondary | ICD-10-CM

## 2023-06-29 DIAGNOSIS — B009 Herpesviral infection, unspecified: Secondary | ICD-10-CM

## 2023-06-29 DIAGNOSIS — Z794 Long term (current) use of insulin: Secondary | ICD-10-CM

## 2023-06-29 DIAGNOSIS — E559 Vitamin D deficiency, unspecified: Secondary | ICD-10-CM

## 2023-06-29 DIAGNOSIS — B9689 Other specified bacterial agents as the cause of diseases classified elsewhere: Secondary | ICD-10-CM

## 2023-06-29 DIAGNOSIS — E1165 Type 2 diabetes mellitus with hyperglycemia: Secondary | ICD-10-CM

## 2023-06-29 LAB — POCT GLYCOSYLATED HEMOGLOBIN (HGB A1C): Hemoglobin A1C: 10.7 % — AB (ref 4.0–5.6)

## 2023-06-29 MED ORDER — FLUCONAZOLE 150 MG PO TABS
150.0000 mg | ORAL_TABLET | Freq: Once | ORAL | 0 refills | Status: AC
  Filled 2023-06-29: qty 1, 1d supply, fill #0

## 2023-06-29 MED ORDER — VALACYCLOVIR HCL 500 MG PO TABS
1000.0000 mg | ORAL_TABLET | Freq: Every day | ORAL | 11 refills | Status: AC
  Filled 2023-06-29: qty 30, 15d supply, fill #0
  Filled 2023-08-06: qty 30, 15d supply, fill #1

## 2023-06-29 MED ORDER — FLUOXETINE HCL 20 MG PO CAPS
20.0000 mg | ORAL_CAPSULE | Freq: Every day | ORAL | 1 refills | Status: DC
  Filled 2023-06-29: qty 30, 30d supply, fill #0
  Filled 2023-08-06: qty 30, 30d supply, fill #1

## 2023-06-29 NOTE — Progress Notes (Signed)
 Established Patient Office Visit  Subjective   Patient ID: Julia Dunn, female    DOB: Aug 15, 1970  Age: 53 y.o. MRN: [id number]  Chief Complaint  Patient presents with   Follow-up   Vaginal Itching    Patient was being treated for Pneumonia with antibiotics, she now has symptoms of yeast infection    Discussed the use of AI scribe software for clinical note transcription with the patient, who gave verbal consent to proceed.  History of Present Illness   The patient, with a history of diabetes, hypertension, genital herpes, and carpal tunnel syndrome, presents today with multiple health concerns. She reports struggling with medication adherence, particularly with her diabetes medications. She recently restarted Trulicity and glipizide, and has increased her Lantus to 35 units. She reports better adherence with her pills but has not been checking her blood sugars at home due to hand numbness from carpal tunnel syndrome.  The patient also reports concerns about her blood pressure, which she has been monitoring at home. She reports readings around 180/110, but is unsure about the accuracy of her home monitor.  The patient has a history of genital herpes and reports having at least three outbreaks in the past year. She is currently experiencing an outbreak, which she believes is complicated by a concurrent yeast infection. Describes thick cottage cheese discharge with itching. She reports significant discomfort and distress related to these conditions. States that she did take some left over valtrex with mild relief, unsure of dosing.  States that she is taking the prozac, but has been experiencing more episodes of depression due to increased life stressors.   Finally, the patient reports recent pneumonia, for which she was treated with antibiotics. She reports feeling better and having fewer coughs.     04/08/2022   10:38 AM 12/07/2020    2:51 PM 10/20/2019    4:20 PM 04/21/2019   11:15 AM  05/19/2016    9:32 AM  Depression screen PHQ 2/9  Decreased Interest 2 1 1 1  0  Down, Depressed, Hopeless 2 1 1 1 1   PHQ - 2 Score 4 2 2 2 1   Altered sleeping 1 0 1 1 0  Tired, decreased energy 2 2 0 1 0  Change in appetite 2 1 0 1 1  Feeling bad or failure about yourself  1 2 0 2 1  Trouble concentrating 0 0 0 0 0  Moving slowly or fidgety/restless 0 0 0 0 0  Suicidal thoughts 0 0 0 0 0  PHQ-9 Score 10 7 3 7 3       04/08/2022   10:38 AM 12/07/2020    2:51 PM 10/20/2019    4:21 PM 04/21/2019   11:15 AM  GAD 7 : Generalized Anxiety Score  Nervous, Anxious, on Edge 1 0 0 1  Control/stop worrying 1 2 1 3   Worry too much - different things 1 2 1 3   Trouble relaxing 1 2 0 2  Restless 0 1 0 1  Easily annoyed or irritable 2 2 0 3  Afraid - awful might happen 2 1 0 0  Total GAD 7 Score 8 10 2 13   Anxiety Difficulty    Somewhat difficult        Past Medical History:  Diagnosis Date   Anemia Dx 2013   Asthma    Bacterial vaginosis 2008   Complication of anesthesia    pt states was informed had difficult time awakening following hysterectomy    Depression  Depression    Diabetes mellitus Dx 2005   Fibroids 09/05/2011   08/08/2011: Anteverted uterus. Diffuse fibroid involvement. Left of midline fibroid with submucosal component. 3.4cm x 2.8cmx 3.4cm.    GERD (gastroesophageal reflux disease)    Gestational diabetes    H/O shortness of breath    pt states has improved since lossing weight; triggers currently are enviromental pt states can make it to top of stairs w/o having to stop to catch her breath   Headache(784.0)    migraines   History of blood transfusion 2013/2014   History of bronchitis    Hx of blood clots    Hypertension Dx 2015   Kidney infection 2015   Menorrhagia 09/27/2010   Pneumonia    Postpartum depression    Pregnancy induced hypertension    Sleep apnea    mild, no CPAP recommended   Trichomonas    Social History   Socioeconomic History   Marital  status: Significant Other    Spouse name: Not on file   Number of children: 3   Years of education: Not on file   Highest education level: Not on file  Occupational History   Occupation: Unemployed  Tobacco Use   Smoking status: Never   Smokeless tobacco: Never  Vaping Use   Vaping status: Never Used  Substance and Sexual Activity   Alcohol use: Yes    Comment: social   Drug use: No   Sexual activity: Yes    Birth control/protection: Surgical    Comment: hysterectomy  Other Topics Concern   Not on file  Social History Narrative   Not on file   Social Drivers of Health   Financial Resource Strain: Not on file  Food Insecurity: Not on file  Transportation Needs: Not on file  Physical Activity: Not on file  Stress: Not on file  Social Connections: Unknown (06/16/2022)   Received from [facility], [facility]   Social Network    Social Network: Not on file  Intimate Partner Violence: Unknown (06/16/2022)   [facility] from [facility], [facility]   HITS    Physically Hurt: Not on file    Insult or Talk Down To: Not on file    Threaten Physical Harm: Not on file    Scream or Curse: Not on file   Family History  Problem Relation Age of Onset   Hypertension Brother    Stroke Brother    Sickle cell trait Daughter    Hypertension Father    Heart disease Mother    Rheumatic fever Mother    Hypertension Mother    Stroke Mother    CAD Mother    Heart disease Paternal Aunt    Allergies  Allergen Reactions   Metformin And Related Other (See Comments)    GI upset even with low dose.    Review of Systems  Constitutional:  Negative for chills and fever.  HENT: Negative.    Eyes: Negative.   Respiratory:  Positive for cough. Negative for shortness of breath.   Cardiovascular:  Negative for chest pain.  Gastrointestinal: Negative.   Genitourinary: Negative.   Musculoskeletal: Negative.   Skin: Negative.   Neurological: Negative.   Endo/Heme/Allergies:  Negative.   Psychiatric/Behavioral:  Positive for depression. The patient is nervous/anxious. The patient does not have insomnia.       Objective:     BP 130/84 (BP Location: Left Arm, Patient Position: Sitting, Cuff Size: Large)   Pulse 79   LMP 06/29/2012  BP Readings from Last 3 Encounters:  06/29/23 130/84  06/11/23 138/86  04/23/23 122/84   Wt Readings from Last 3 Encounters:  06/29/23 236 lb (107 kg)  06/11/23 251 lb 1.7 oz (113.9 kg)  08/27/22 251 lb (113.9 kg)    Physical Exam Vitals and nursing note reviewed.  Constitutional:      Appearance: Normal appearance.  HENT:     Head: Normocephalic and atraumatic.     Right Ear: External ear normal.     Left Ear: External ear normal.     Nose: Nose normal.     Mouth/Throat:     Mouth: Mucous membranes are moist.     Pharynx: Oropharynx is clear.  Eyes:     Extraocular Movements: Extraocular movements intact.     Conjunctiva/sclera: Conjunctivae normal.     Pupils: Pupils are equal, round, and reactive to light.  Cardiovascular:     Rate and Rhythm: Normal rate and regular rhythm.     Pulses: Normal pulses.     Heart sounds: Normal heart sounds.  Pulmonary:     Effort: Pulmonary effort is normal.     Breath sounds: Normal breath sounds.  Musculoskeletal:        General: Normal range of motion.     Cervical back: Normal range of motion and neck supple.  Skin:    General: Skin is warm and dry.  Neurological:     General: No focal deficit present.     Mental Status: She is alert and oriented to person, place, and time.  Psychiatric:        Mood and Affect: Mood normal.        Behavior: Behavior normal.        Thought Content: Thought content normal.        Judgment: Judgment normal.       Assessment & Plan:   Problem List Items Addressed This Visit   None   1. Type 2 diabetes mellitus with hyperglycemia, with long-term current use of insulin (HCC) (Primary) Type 2 diabetes mellitus with  A1c of 10.7.  Previously on Lantus, Trulicity, and glipizide. Delayed Trulicity (just started last week) due to illness and lack of food intake but has resumed it. Difficulty with fingerstick glucose monitoring due to carpal tunnel syndrome, prefers using [employer]. - Continue Lantus, Trulicity, and glipizide. - Encourage use of [employer] for glucose monitoring, states she has supplies - Emphasize importance of medication adherence. - Microalbumin / creatinine urine ratio - POCT glycosylated hemoglobin (Hb A1C)  2. Essential hypertension Hypertension with recent home readings of 180/110, though in-office reading was 130/84. Unsure about medication adherence and difficulty understanding blood pressure readings. Needs to monitor blood pressure regularly and ensure medication adherence. - Instruct to check blood pressure daily and record readings. - Bring blood pressure cuff to next appointment to verify accuracy. - Ensure consistent medication adherence.  3. Mild recurrent major depression (HCC) On Prozac for depression and reports it is helping. Experiences emotional lability when medication effects wane.  - Increase Prozac to 20 mg daily. - Encourage consistent medication adherence. - FLUoxetine (PROZAC) 20 MG capsule; Take 1 capsule (20 mg total) by mouth daily.  Dispense: 30 capsule; Refill: 1  4. Antibiotic-induced yeast infection Symptoms consistent with a yeast infection, including thick white discharge. Symptoms occurred following antibiotic treatment for pneumonia. Plan to confirm diagnosis with a vaginal swab. - Prescribe Diflucan for yeast infection. - Perform vaginal swab to confirm diagnosis - fluconazole (DIFLUCAN) 150  MG tablet; Take 1 tablet (150 mg total) by mouth once for 1 dose.  Dispense: 1 tablet; Refill: 0 - Cervicovaginal ancillary only  5. Hyperlipidemia due to type 2 diabetes mellitus (HCC)  - Lipid panel  6. Vitamin D insufficiency  - Vitamin D, 25-hydroxy  7.  Herpes infection Recurrent genital herpes outbreaks, with at least three episodes in the past year. Current outbreak resolving. Has been using famciclovir during outbreaks. Discussed potential for daily suppressive therapy to reduce frequency of outbreaks. - Initiate daily suppressive therapy with Valtrex. - Educate on the importance of medication adherence to prevent outbreaks. - valACYclovir (VALTREX) 500 MG tablet; Take 2 tablets (1,000 mg total) by mouth daily.  Dispense: 30 tablet; Refill: 11  8. Encounter for screening mammogram for malignant neoplasm of breast  - MM 3D SCREENING MAMMOGRAM BILATERAL BREAST; Future   General Health Maintenance Lacks health insurance and has not had a recent mammogram. Financial constraints are a barrier to accessing healthcare services. Discussed options for financial assistance and mammogram scholarship. - Apply for [facility] financial assistance and Medicaid. - Order mammogram and arrange for a scholarship to cover the cost.   I have reviewed the patient's medical history (PMH, PSH, Social History, Family History, Medications, and allergies) , and have been updated if relevant. I spent 40 minutes reviewing chart and  face to face time with patient.    No follow-ups on file.    Kasandra Knudsen Mayers, PA-C

## 2023-06-29 NOTE — Patient Instructions (Addendum)
 VISIT SUMMARY:  During today's visit, we discussed several of your health concerns, including diabetes, hypertension, genital herpes, carpal tunnel syndrome, potential breast lumps, and recent pneumonia. We reviewed your current medications and made adjustments where necessary. We also discussed the importance of medication adherence and monitoring your health conditions regularly.  YOUR PLAN:  -PNEUMONIA: Pneumonia is an infection that inflames the air sacs in one or both lungs. Your symptoms have improved after taking antibiotics, and your lungs sound clear. No further chest x-ray is needed at this time.  -TYPE 2 DIABETES MELLITUS: Type 2 diabetes is a condition that affects the way your body processes blood sugar. Continue taking Lantus, Trulicity, and glipizide as prescribed. Use the [facility] for easier glucose monitoring and ensure you take your medications regularly.  -HYPERTENSION: Hypertension, or high blood pressure, can lead to serious health issues if not managed. Your home readings have been high, but your in-office reading was normal. Check your blood pressure daily, record the readings, and bring your blood pressure cuff to your next appointment to verify its accuracy. Ensure you take your blood pressure medications consistently.  -GENITAL HERPES: Genital herpes is a sexually transmitted infection that causes sores and discomfort. You have had multiple outbreaks this year. We will start you on daily suppressive therapy with Valtrex to reduce the frequency of outbreaks. Make sure to take your medication regularly.  -YEAST INFECTION: A yeast infection is a fungal infection that causes irritation and discharge. You have symptoms consistent with a yeast infection, likely due to recent antibiotic use. We will prescribe Diflucan and perform a vaginal swab to confirm the diagnosis.  -DEPRESSION: Depression is a mood disorder that causes persistent feelings of sadness and loss of  interest. You are currently on Prozac, which is helping. We will increase your dosage to 20 mg daily to improve symptom control. Ensure you take your medication consistently.  -GENERAL HEALTH MAINTENANCE: We discussed the importance of regular health check-ups and screenings. You need a mammogram, and we will apply for financial assistance and a scholarship to cover the cost. We also discussed options for financial assistance and Medicaid.  INSTRUCTIONS:  Please check your blood pressure daily and record the readings. Bring your blood pressure cuff to your next appointment to verify its accuracy. Continue taking all your medications as prescribed and use the [facility] for glucose monitoring. We will apply for financial assistance and a scholarship for your mammogram. Follow up with [place] if you have any new symptoms or concerns.  Managing Stress, Adult Feeling a certain amount of stress is normal. Stress helps our body and mind get ready to deal with the demands of life. Stress hormones can motivate you to do well at work and meet your responsibilities. But severe or long-term (chronic) stress can affect your mental and physical health. Chronic stress puts you at higher risk for: Anxiety and depression. Other health problems such as digestive problems, muscle aches, heart disease, high blood pressure, and stroke. What are the causes? Common causes of stress include: Demands from work, such as deadlines, feeling overworked, or having long hours. Pressures at home, such as money issues, disagreements with a spouse, or parenting issues. Pressures from major life changes, such as divorce, moving, loss of a loved one, or chronic illness. You may be at higher risk for stress-related problems if you: Do not get enough sleep. Are in poor health. Do not have emotional support. Have a mental health disorder such as anxiety or depression. How to  recognize stress Stress can make you: Have trouble  sleeping. Feel sad, anxious, irritable, or overwhelmed. Lose your appetite. Overeat or want to eat unhealthy foods. Want to use drugs or alcohol. Stress can also cause physical symptoms, such as: Sore, tense muscles, especially in the shoulders and neck. Headaches. Trouble breathing. A faster heart rate. Stomach pain, nausea, or vomiting. Diarrhea or constipation. Trouble concentrating. Follow these instructions at home: Eating and drinking Eat a healthy diet. This includes: Eating foods that are high in fiber, such as beans, whole grains, and fresh fruits and vegetables. Limiting foods that are high in fat and processed sugars, such as fried or sweet foods. Do not skip meals or overeat. Drink enough fluid to keep your urine pale yellow. Alcohol use Do not drink alcohol if: Your health care provider tells you not to drink. You are pregnant, may be pregnant, or are planning to become pregnant. Drinking alcohol is a way some people try to ease their stress. This can be dangerous, so if you drink alcohol: Limit how much you have to: 0-1 drink a day for women. 0-2 drinks a day for men. Know how much alcohol is in your drink. In the [place] one drink equals one 12 oz bottle of beer (355 mL), one 5 oz glass of wine (148 mL), or one 1 oz glass of hard liquor (44 mL). Activity  Include 30 minutes of exercise in your daily schedule. Exercise is a good stress reducer. Include time in your day for an activity that you find relaxing. Try taking a walk, going on a bike ride, reading a book, or listening to music. Schedule your time in a way that lowers stress, and keep a regular schedule. Focus on doing what is most important to get done. Lifestyle Identify the source of your stress and your reaction to it. See a therapist who can help you change unhelpful reactions. When there are stressful events: Talk about them with family, friends, or coworkers. Try to think realistically about stressful  events and not ignore them or overreact. Try to find the positives in a stressful situation and not focus on the negatives. Cut back on responsibilities at work and home, if possible. Ask for help from friends or family members if you need it. Find ways to manage stress, such as: Mindfulness, meditation, or deep breathing. Yoga or tai chi. Progressive muscle relaxation. Spending time in nature. Doing art, playing music, or reading. Making time for fun activities. Spending time with family and friends. Get support from family, friends, or spiritual resources. General instructions Get enough sleep. Try to go to sleep and get up at about the same time every day. Take over-the-counter and prescription medicines only as told by your health care provider. Do not use any products that contain nicotine or tobacco. These products include cigarettes, chewing tobacco, and vaping devices, such as e-cigarettes. If you need help quitting, ask your health care provider. Do not use drugs or smoke to deal with stress. Keep all follow-up visits. This is important. Where to find support Talk with your health care provider about stress management or finding a support group. Find a therapist to work with you on your stress management techniques. Where to find more information [employer] Mental Illness: www.nami.org American Psychological Association: [employer] Contact a health care provider if: Your stress symptoms get worse. You are unable to manage your stress at home. You are struggling to stop using drugs or alcohol. Get help right away if: You  may be a danger to yourself or others. You have any thoughts of death or suicide. Get help right awayif you feel like you may hurt yourself or others, or have thoughts about taking your own life. Go to your nearest emergency room or: Call 911. Call the National Suicide Prevention Lifeline at 604 635 6140 or 988 in the [place] This is open 24 hours  a day. If you're a Veteran: Call 988 and press 1. This is open 24 hours a day. Text the [employer] at (205)118-0622. Summary Feeling a certain amount of stress is normal, but severe or long-term (chronic) stress can affect your mental and physical health. Chronic stress can put you at higher risk for anxiety, depression, and other health problems such as digestive problems, muscle aches, heart disease, high blood pressure, and stroke. You may be at higher risk for stress-related problems if you do not get enough sleep, are in poor health, lack emotional support, or have a mental health disorder such as anxiety or depression. Identify the source of your stress and your reaction to it. Try talking about stressful events with family, friends, or coworkers, finding a coping method, or getting support from spiritual resources. If you need more help, talk with your health care provider about finding a support group or a mental health therapist. This information is not intended to replace advice given to you by your health care provider. Make sure you discuss any questions you have with your health care provider. Document Revised: 10/30/2022 Document Reviewed: 10/09/2020 Elsevier Patient Education  2024 [employer].

## 2023-06-30 ENCOUNTER — Encounter: Payer: Self-pay | Admitting: Physician Assistant

## 2023-06-30 ENCOUNTER — Other Ambulatory Visit: Payer: Self-pay

## 2023-06-30 LAB — LIPID PANEL
Chol/HDL Ratio: 2.8 ratio (ref 0.0–4.4)
Cholesterol, Total: 158 mg/dL (ref 100–199)
HDL: 56 mg/dL (ref >39)
LDL Chol Calc (NIH): 86 mg/dL (ref 0–99)
Triglycerides: 86 mg/dL (ref 0–149)
VLDL Cholesterol Cal: 16 mg/dL (ref 5–40)

## 2023-06-30 LAB — CERVICOVAGINAL ANCILLARY ONLY
Bacterial Vaginitis (gardnerella): POSITIVE — AB
Candida Glabrata: NEGATIVE
Candida Vaginitis: NEGATIVE
Chlamydia: NEGATIVE
Comment: NEGATIVE
Comment: NEGATIVE
Comment: NEGATIVE
Comment: NEGATIVE
Comment: NEGATIVE
Comment: NORMAL
Neisseria Gonorrhea: NEGATIVE
Trichomonas: NEGATIVE

## 2023-06-30 LAB — VITAMIN D 25 HYDROXY (VIT D DEFICIENCY, FRACTURES): Vit D, 25-Hydroxy: 17.6 ng/mL — ABNORMAL LOW (ref 30.0–100.0)

## 2023-06-30 LAB — MICROALBUMIN / CREATININE URINE RATIO
Creatinine, Urine: 200 mg/dL
Microalb/Creat Ratio: 3 mg/g{creat} (ref 0–29)
Microalbumin, Urine: 5 ug/mL

## 2023-06-30 MED ORDER — VITAMIN D (ERGOCALCIFEROL) 1.25 MG (50000 UNIT) PO CAPS
50000.0000 [IU] | ORAL_CAPSULE | ORAL | 2 refills | Status: DC
  Filled 2023-06-30: qty 4, 28d supply, fill #0
  Filled 2023-08-06: qty 4, 28d supply, fill #1
  Filled 2023-12-22: qty 4, 28d supply, fill #2

## 2023-06-30 MED ORDER — METRONIDAZOLE 500 MG PO TABS
500.0000 mg | ORAL_TABLET | Freq: Two times a day (BID) | ORAL | 0 refills | Status: AC
  Filled 2023-06-30: qty 14, 7d supply, fill #0

## 2023-06-30 NOTE — Addendum Note (Signed)
 Addended by: Roney Jaffe on: 06/30/2023 05:31 PM   Modules accepted: Orders

## 2023-06-30 NOTE — Addendum Note (Signed)
 Addended by: Roney Jaffe on: 06/30/2023 02:48 PM   Modules accepted: Orders

## 2023-07-01 ENCOUNTER — Other Ambulatory Visit: Payer: Self-pay

## 2023-07-03 ENCOUNTER — Telehealth: Payer: Self-pay

## 2023-07-03 NOTE — Telephone Encounter (Signed)
 Telephoned patient at mobile number. Left a voice message with [facility] (scholarship) contact information.

## 2023-07-10 ENCOUNTER — Other Ambulatory Visit: Payer: Self-pay

## 2023-07-15 ENCOUNTER — Telehealth: Payer: Self-pay

## 2023-07-15 NOTE — Telephone Encounter (Signed)
 Patient called and stated that she has bilateral breast pain for at least 1 year and thinks she may feel a lump in outer right breast and lower left breast at bra line. Patient stated she has carpal tunnel which affects the feeling in hands/fingers, not sure if has lumps or not. Patient stated the pain comes and goes, sharp at times, and has to remove her bra when she has felt tightness, pain has even affected her breathing with the tightness in her breasts. Patient has been evaluated for possible heart attack/cardiac issues, but was told no cardiac issues. Patient denies any breast skin changes, nipple inversion, and discharge. Patient states pain is better when she removes the bra, applies lidocaine patches, takes Aleve, uses hand massager. Patient informed needs to be evaluated in [facility]. Scheduler is aware and will contact patient.

## 2023-07-21 ENCOUNTER — Ambulatory Visit: Payer: Self-pay | Admitting: Internal Medicine

## 2023-07-24 ENCOUNTER — Other Ambulatory Visit: Payer: Self-pay

## 2023-07-28 ENCOUNTER — Ambulatory Visit: Payer: Self-pay | Admitting: *Deleted

## 2023-07-28 VITALS — BP 138/82 | Wt 240.0 lb

## 2023-07-28 DIAGNOSIS — Z1211 Encounter for screening for malignant neoplasm of colon: Secondary | ICD-10-CM

## 2023-07-28 DIAGNOSIS — Z1239 Encounter for other screening for malignant neoplasm of breast: Secondary | ICD-10-CM

## 2023-07-28 DIAGNOSIS — N644 Mastodynia: Secondary | ICD-10-CM

## 2023-07-28 NOTE — Progress Notes (Signed)
 Julia Dunn is a 53 y.o. female who presents to [facility] clinic today with complaint of bilateral breast pain and lumps x 2 years. Patient states the pain comes and goes. Patient rates the pain at a 6 out of 10.    Pap Smear: Pap smear not completed today. Last Pap smear was 08/01/2011 at [facility] clinic and was normal. Per patient has no history of an abnormal Pap smear. Patient has a history of a hysterectomy 11/03/2012 due to fibroids and AUB. Patient doesn't need any further Pap smears due to her history of a hysterectomy for benign reasons per ASCCP and [facility] guidelines.Last Pap smear result is available in Epic.   Physical exam: Breasts Breasts symmetrical. No skin abnormalities bilateral breasts. No nipple retraction bilateral breasts. No nipple discharge bilateral breasts. No lymphadenopathy. No lumps palpated bilateral breasts. Unable to palpate any lumps in patients areas of concern. Complaints of right outer and left lower breast pain on exam.        Pelvic/Bimanual Pap is not indicated today per [facility] guidelines.   Smoking History: Patient has never smoked.   Patient Navigation: Patient education provided. Access to services provided for patient through [facility] program.   Colorectal Cancer Screening: Per patient has never had colonoscopy completed. FIT Test given to patient to complete. No complaints today.    Breast and Cervical Cancer Risk Assessment: Patient has family history of a paternal aunt and her paternal grandmother having breast cancer. Patient has no known genetic mutations or history of radiation treatment to the chest before age 56. Patient does not have history of cervical dysplasia, immunocompromised, or DES exposure in-utero.  Risk Scores as of Encounter on 07/28/2023     Julia Dunn           5-year 1.24%   Lifetime 8.34%            Last calculated by Julia Dunn, CMA on 07/28/2023 at  1:30 PM        A: [facility] exam without pap smear Complaint of  bilateral breast lumps and pain.  P: Referred patient to [facility] for a diagnostic mammogram. Appointment scheduled Tuesday, July 28, 2023 at 1430.  Julia Edge, RN 07/28/2023 1:56 PM

## 2023-07-28 NOTE — Patient Instructions (Signed)
 Explained breast self awareness with Annabella Keys. Patient did not need a Pap smear today due to patient has a history of a hysterectomy for benign reasons. Let her know that she doesn't need any further Pap smears due to her history of hysterectomy due to benign reasons. Referred patient to [facility] for a diagnostic mammogram. Appointment scheduled Tuesday, July 28, 2023 at 1430. Patient aware of appointment and will be there. Annabella Keys verbalized understanding.  Jevante Hollibaugh, Dela Favor, RN 1:57 PM

## 2023-07-30 NOTE — Progress Notes (Signed)
 Per chart review pt does have a PCP Concetta Dee; [facility]), insurance, and is not a smoker. Pt has an upcoming appt with PCP on 09/21/2023. Pt does not indicate any SDOH needs at this time.  No additional pt f/u to be scheduled at this time per health equity protocol.

## 2023-08-06 ENCOUNTER — Telehealth: Payer: Self-pay

## 2023-08-06 ENCOUNTER — Other Ambulatory Visit: Payer: Self-pay

## 2023-08-06 DIAGNOSIS — Z794 Long term (current) use of insulin: Secondary | ICD-10-CM

## 2023-08-06 NOTE — Telephone Encounter (Signed)
 Copied from CRM [id number]. Topic: Clinical - Medication Question >> Aug 06, 2023  9:37 AM Star East wrote: Reason for CRM: Dulaglutide  (TRULICITY ) 1.5 MG/0.5ML SOAJ- patient asking to increase the dose or try other medicine- [phone]

## 2023-08-12 ENCOUNTER — Other Ambulatory Visit: Payer: Self-pay

## 2023-08-12 MED ORDER — TRULICITY 3 MG/0.5ML ~~LOC~~ SOAJ
3.0000 mg | SUBCUTANEOUS | 1 refills | Status: DC
  Filled 2023-08-12 (×2): qty 2, 28d supply, fill #0
  Filled 2023-09-17: qty 2, 28d supply, fill #1

## 2023-08-12 NOTE — Telephone Encounter (Signed)
 Called & spoke to the patient. Verified name & DOB. Informed to increase Trulicity  to 3mg  once a week per Dr.Johnson. Informed that prescription was sent to our pharmacy. Confirmed upcoming appointment with patient. Patient expressed verbal understanding of all discussed.

## 2023-08-12 NOTE — Addendum Note (Signed)
 Addended by: Concetta Dee B on: 08/12/2023 12:45 PM   Modules accepted: Orders

## 2023-08-12 NOTE — Telephone Encounter (Signed)
 Increase Trulicity  to 3 mg once a wk. Rxn sent to our pharmacy.  Keep appt with me next month.

## 2023-08-14 ENCOUNTER — Other Ambulatory Visit: Payer: Self-pay

## 2023-08-27 ENCOUNTER — Other Ambulatory Visit: Payer: Self-pay

## 2023-09-17 ENCOUNTER — Other Ambulatory Visit: Payer: Self-pay

## 2023-09-18 ENCOUNTER — Other Ambulatory Visit: Payer: Self-pay

## 2023-09-21 ENCOUNTER — Ambulatory Visit: Payer: Self-pay | Attending: Internal Medicine | Admitting: Internal Medicine

## 2023-09-21 ENCOUNTER — Other Ambulatory Visit: Payer: Self-pay

## 2023-09-21 ENCOUNTER — Encounter: Payer: Self-pay | Admitting: Internal Medicine

## 2023-09-21 DIAGNOSIS — E1159 Type 2 diabetes mellitus with other circulatory complications: Secondary | ICD-10-CM

## 2023-09-21 DIAGNOSIS — R079 Chest pain, unspecified: Secondary | ICD-10-CM | POA: Diagnosis not present

## 2023-09-21 DIAGNOSIS — Z7985 Long-term (current) use of injectable non-insulin antidiabetic drugs: Secondary | ICD-10-CM

## 2023-09-21 DIAGNOSIS — Z23 Encounter for immunization: Secondary | ICD-10-CM

## 2023-09-21 DIAGNOSIS — E785 Hyperlipidemia, unspecified: Secondary | ICD-10-CM | POA: Diagnosis not present

## 2023-09-21 DIAGNOSIS — E1169 Type 2 diabetes mellitus with other specified complication: Secondary | ICD-10-CM | POA: Diagnosis not present

## 2023-09-21 DIAGNOSIS — I152 Hypertension secondary to endocrine disorders: Secondary | ICD-10-CM | POA: Diagnosis not present

## 2023-09-21 DIAGNOSIS — Z794 Long term (current) use of insulin: Secondary | ICD-10-CM | POA: Diagnosis not present

## 2023-09-21 DIAGNOSIS — Z6841 Body Mass Index (BMI) 40.0 and over, adult: Secondary | ICD-10-CM

## 2023-09-21 DIAGNOSIS — I10 Essential (primary) hypertension: Secondary | ICD-10-CM

## 2023-09-21 DIAGNOSIS — Z7984 Long term (current) use of oral hypoglycemic drugs: Secondary | ICD-10-CM | POA: Diagnosis not present

## 2023-09-21 DIAGNOSIS — F330 Major depressive disorder, recurrent, mild: Secondary | ICD-10-CM

## 2023-09-21 MED ORDER — SEMAGLUTIDE(0.25 OR 0.5MG/DOS) 2 MG/3ML ~~LOC~~ SOPN
0.5000 mg | PEN_INJECTOR | SUBCUTANEOUS | 0 refills | Status: DC
  Filled 2023-09-21: qty 3, 28d supply, fill #0

## 2023-09-21 MED ORDER — GLIPIZIDE 5 MG PO TABS
5.0000 mg | ORAL_TABLET | Freq: Two times a day (BID) | ORAL | 1 refills | Status: DC
  Filled 2023-09-21: qty 90, 45d supply, fill #0
  Filled 2023-11-05: qty 90, 45d supply, fill #1

## 2023-09-21 MED ORDER — FUROSEMIDE 20 MG PO TABS
20.0000 mg | ORAL_TABLET | Freq: Every day | ORAL | 1 refills | Status: AC
  Filled 2023-09-21: qty 90, 90d supply, fill #0
  Filled 2024-02-08: qty 90, 90d supply, fill #1

## 2023-09-21 MED ORDER — ZOSTER VAC RECOMB ADJUVANTED 50 MCG/0.5ML IM SUSR: 0.5000 mL | Freq: Once | INTRAMUSCULAR | 0 refills | Status: AC

## 2023-09-21 MED ORDER — LISINOPRIL-HYDROCHLOROTHIAZIDE 20-12.5 MG PO TABS
2.0000 | ORAL_TABLET | Freq: Every day | ORAL | 1 refills | Status: AC
  Filled 2023-09-21: qty 180, 90d supply, fill #0
  Filled 2024-02-08: qty 180, 90d supply, fill #1

## 2023-09-21 MED ORDER — AMLODIPINE BESYLATE 10 MG PO TABS
10.0000 mg | ORAL_TABLET | Freq: Every day | ORAL | 1 refills | Status: DC
  Filled 2023-09-21: qty 90, 90d supply, fill #0
  Filled 2024-02-08: qty 90, 90d supply, fill #1

## 2023-09-21 MED ORDER — ATORVASTATIN CALCIUM 20 MG PO TABS
20.0000 mg | ORAL_TABLET | Freq: Every day | ORAL | 1 refills | Status: AC
  Filled 2023-09-21: qty 90, 90d supply, fill #0

## 2023-09-21 MED ORDER — INSULIN GLARGINE 100 UNIT/ML SOLOSTAR PEN
40.0000 [IU] | PEN_INJECTOR | Freq: Every day | SUBCUTANEOUS | 5 refills | Status: DC
  Filled 2023-09-21 – 2023-11-05 (×2): qty 15, 37d supply, fill #0

## 2023-09-21 MED ORDER — CARVEDILOL 3.125 MG PO TABS
3.1250 mg | ORAL_TABLET | Freq: Two times a day (BID) | ORAL | 3 refills | Status: AC
  Filled 2023-09-21: qty 60, 30d supply, fill #0
  Filled 2023-12-22: qty 60, 30d supply, fill #1
  Filled 2024-02-08: qty 60, 30d supply, fill #2
  Filled 2024-03-11 – 2024-03-28 (×2): qty 60, 30d supply, fill #3

## 2023-09-21 NOTE — Progress Notes (Signed)
 Patient ID: Julia Dunn, female    DOB: Oct 27, 1970  MRN: [id number]  CC: chronic ds management. Discuss getting a CGM/Discuss vitamin D /Yes to shingles & pneumonia vax)   Subjective: Julia Dunn is a 53 y.o. female who presents for chronic ds management Her concerns today include:  Pt with hx of HTN, DM 2, obesity, hx abn MMG (12/2020)   Discussed the use of AI scribe software for clinical note transcription with the patient, who gave verbal consent to proceed.  History of Present Illness Julia Dunn is a 53 year old female with diabetes and hypertension who presents for a follow-up visit.  DM: Lab Results  Component Value Date   HGBA1C 10.7 (A) 06/29/2023  She manages her diabetes with Lantus  insulin  35 units daily, Trulicity  3 mg weekly, and glipizide  5 mg twice a day. She monitors her blood sugar twice daily, with morning levels between 150-180 mg/dL before BF and evening levels sometimes reaching 230 mg/dL before dinner. She is reducing sugar intake and primarily consumes water , soda water , tonic water , and low-sugar juices. She is physically active, working as a Financial trader. Her weight remains stable at 241 lbs, though she has previously been down to 229 lbs. She avoids junk food and fast food, preferring vegetables like cauliflower and broccoli.  HTN: she had seen the PA at the mobile [facility] in March of this year.  She prescribed lisinopril /HCTZ 20/12.53 tablets daily.  Takes lisinopril /hydrochlorothiazide  20/12.5 mg, three tablets daily, and amlodipine  10 mg daily. She checks her blood pressure every other day but questions the accuracy of her readings, which often show high values like 180/120 mmHg. She limits her salt intake. She experiences central chest pain and shortness of breath when overwhelmed with work, which resolves with rest and Aleve . The pain lasts from a few minutes to 30 minutes. No chest pain or shortness of breath at rest.  HL:She takes atorvastatin  20 mg daily  for cholesterol management   Depression: Positive depression screen today.  Currently on fluoxetine  20 mg daily for depression, which helps with mood swings but she is not sure that it helps with depression.    Patient Active Problem List   Diagnosis Date Noted   Depression, major, recurrent, moderate (HCC) 06/13/2022   Bilateral hand pain 08/09/2021   Right carpal tunnel syndrome 08/09/2021   Left carpal tunnel syndrome 08/09/2021   Acute on chronic diastolic congestive heart failure (HCC) 08/03/2021   Closed displaced bimalleolar fracture of left ankle 01/04/2016   Abnormal EKG 06/22/2014   Abnormal CT scan of lung 06/22/2014   Dyspnea 06/19/2014   Microscopic hematuria 04/07/2014   Onychomycosis 05/31/2013   BI-RADS category 3 mammogram result 03/07/2013   Vitamin D  insufficiency 08/13/2011   Morbid obesity (HCC) 08/10/2007   ALLERGIC RHINITIS, SEASONAL 07/14/2006   DM2 (diabetes mellitus, type 2) 05/28/2006   DEPRESSIVE DISORDER, NOS 05/28/2006   HTN (hypertension) 05/28/2006     Current Outpatient Medications on File Prior to Visit  Medication Sig Dispense Refill   albuterol  (VENTOLIN  HFA) 108 (90 Base) MCG/ACT inhaler Inhale 2 puffs into the lungs every 6 (six) hours as needed for wheezing or shortness of breath. 6.7 g 1   diclofenac  Sodium (VOLTAREN ) 1 % GEL Apply 4 g topically 4 (four) times daily as needed. 500 g 6   FLUoxetine  (PROZAC ) 20 MG capsule Take 1 capsule (20 mg total) by mouth daily. 30 capsule 1   Insulin  Pen Needle (TRUEPLUS PEN NEEDLES) 32G X 4 MM MISC  Use to inject insulin  daily. 100 each 11   omeprazole  (PRILOSEC) 20 MG capsule TAKE 1 CAPSULE (20 MG TOTAL) BY MOUTH DAILY. 30 capsule 0   ONE TOUCH ULTRA TEST test strip USE  STRIP TO CHECK GLUCOSE THREE TIMES DAILY BEFORE MEAL(S) 100 each 5   Vitamin D , Ergocalciferol , (DRISDOL ) 1.25 MG (50000 UNIT) CAPS capsule Take 1 capsule (50,000 Units total) by mouth every 7 (seven) days. 4 capsule 2   valACYclovir   (VALTREX ) 500 MG tablet Take 2 tablets (1,000 mg total) by mouth daily. (Patient not taking: Reported on 09/21/2023) 30 tablet 11   No current facility-administered medications on file prior to visit.    Allergies  Allergen Reactions   Metformin  And Related Other (See Comments)    GI upset even with low dose.    Social History   Socioeconomic History   Marital status: Significant Other    Spouse name: Not on file   Number of children: 3   Years of education: Not on file   Highest education level: Not on file  Occupational History   Occupation: Unemployed  Tobacco Use   Smoking status: Never   Smokeless tobacco: Never  Vaping Use   Vaping status: Never Used  Substance and Sexual Activity   Alcohol use: Yes    Comment: social   Drug use: No   Sexual activity: Yes    Birth control/protection: Surgical    Comment: hysterectomy  Other Topics Concern   Not on file  Social History Narrative   Not on file   Social Drivers of Health   Financial Resource Strain: Not on file  Food Insecurity: No Food Insecurity (07/28/2023)   Hunger Vital Sign    Worried About Running Out of Food in the Last Year: Never true    Ran Out of Food in the Last Year: Never true  Transportation Needs: No Transportation Needs (07/28/2023)   PRAPARE - Administrator, Civil Service (Medical): No    Lack of Transportation (Non-Medical): No  Physical Activity: Not on file  Stress: Not on file  Social Connections: Unknown (06/16/2022)   Received from [facility]   Social Network    Social Network: Not on file  Intimate Partner Violence: Not At Risk (06/29/2023)   Humiliation, Afraid, Rape, and Kick questionnaire    Fear of Current or Ex-Partner: No    Emotionally Abused: No    Physically Abused: No    Sexually Abused: No    Family History  Problem Relation Age of Onset   Heart disease Mother    Rheumatic fever Mother    Hypertension Mother    Stroke Mother    CAD Mother     Hypertension Father    Hypertension Brother    Stroke Brother    Sickle cell trait Daughter    Heart disease Paternal Aunt    Breast cancer Paternal Aunt 18 - 46   Breast cancer Paternal Grandmother 15 - 35    Past Surgical History:  Procedure Laterality Date   ABDOMINAL HYSTERECTOMY N/A 11/03/2012   Procedure: HYSTERECTOMY ABDOMINAL;  Surgeon: Rome Rigg, MD;  Location: [facility];  Service: Gynecology;  Laterality: N/A;   BILATERAL SALPINGECTOMY Bilateral 11/03/2012   Procedure: BILATERAL SALPINGECTOMY;  Surgeon: Rome Rigg, MD;  Location: [facility];  Service: Gynecology;  Laterality: Bilateral;   BURCH PROCEDURE N/A 11/03/2012   Procedure: BURCH PROCEDURE;  Surgeon: Rome Rigg, MD;  Location: [facility];  Service: Gynecology;  Laterality: N/A;  CARPAL TUNNEL RELEASE Right 11/20/2021   Procedure: RIGHT CARPAL TUNNEL RELEASE;  Surgeon: Jerri Kay HERO, MD;  Location: [facility];  Service: Orthopedics;  Laterality: Right;   CYSTOSCOPY N/A 11/03/2012   Procedure: CYSTOSCOPY;  Surgeon: Rome Rigg, MD;  Location: [facility];  Service: Gynecology;  Laterality: N/A;   DILATION AND CURETTAGE OF UTERUS     LAPAROSCOPY N/A 11/03/2012   Procedure: LAPAROSCOPY DIAGNOSTIC;  Surgeon: Rome Rigg, MD;  Location: [facility];  Service: Gynecology;  Laterality: N/A;   ORIF ANKLE FRACTURE Left 01/04/2016   Procedure: OPEN REDUCTION INTERNAL FIXATION (ORIF) LEFT BIMALLEOLAR ANKLE FRACTURE;  Surgeon: Lonni CINDERELLA Poli, MD;  Location: [facility];  Service: Orthopedics;  Laterality: Left;   TUBAL LIGATION     WISDOM TOOTH EXTRACTION     3 teeth    ROS: Review of Systems Negative except as stated above  PHYSICAL EXAM: BP 120/76 (BP Location: Left Arm, Patient Position: Sitting, Cuff Size: Normal)   Pulse 76   Ht 5' 4 (1.626 m)   Wt 241 lb (109.3 kg)   LMP 06/29/2012   SpO2 98%   BMI 41.37 kg/m   Wt Readings from Last 3 Encounters:  09/21/23 241 lb (109.3 kg)  07/28/23 240 lb (108.9  kg)  06/29/23 236 lb (107 kg)    Physical Exam  General appearance - alert, well appearing, and in no distress Mental status - tearful at times Neck - supple, no significant adenopathy Chest - clear to auscultation, no wheezes, rales or rhonchi, symmetric air entry Heart - normal rate, regular rhythm, normal S1, S2, no murmurs, rubs, clicks or gallops Extremities - peripheral pulses normal, no pedal edema, no clubbing or cyanosis Diabetic Foot Exam - Simple   Simple Foot Form Diabetic Foot exam was performed with the following findings: Yes 09/21/2023 10:03 AM  Visual Inspection No deformities, no ulcerations, no other skin breakdown bilaterally: Yes Sensation Testing Intact to touch and monofilament testing bilaterally: Yes Pulse Check Posterior Tibialis and Dorsalis pulse intact bilaterally: Yes Comments       09/21/2023   10:12 AM 04/08/2022   10:38 AM 12/07/2020    2:51 PM  Depression screen PHQ 2/9  Decreased Interest 1 2 1   Down, Depressed, Hopeless 1 2 1   PHQ - 2 Score 2 4 2   Altered sleeping 2 1 0  Tired, decreased energy 2 2 2   Change in appetite 2 2 1   Feeling bad or failure about yourself  1 1 2   Trouble concentrating 1 0 0  Moving slowly or fidgety/restless 0 0 0  Suicidal thoughts 0 0 0  PHQ-9 Score 10 10 7   Difficult doing work/chores Somewhat difficult         Latest Ref Rng & Units 06/11/2023   12:18 AM 04/23/2023    1:10 PM 09/02/2022    9:51 AM  CMP  Glucose 70 - 99 mg/dL 771  592  828   BUN 6 - 20 mg/dL 7  20  11    Creatinine 0.44 - 1.00 mg/dL 9.23  8.97  9.13   Sodium 135 - 145 mmol/L 139  137  141   Potassium 3.5 - 5.1 mmol/L 3.6  4.3  4.0   Chloride 98 - 111 mmol/L 100  97  103   CO2 22 - 32 mmol/L 25  29  24    Calcium  8.9 - 10.3 mg/dL 8.9  9.4  9.1   Total Protein 6.5 - 8.1 g/dL 6.9  7.1  Total Bilirubin 0.0 - 1.2 mg/dL 1.6  0.8    Alkaline Phos 38 - 126 U/L 54  77    AST 15 - 41 U/L 28  14    ALT 0 - 44 U/L 43  19     Lipid Panel      Component Value Date/Time   CHOL 158 06/29/2023 1203   TRIG 86 06/29/2023 1203   HDL 56 06/29/2023 1203   CHOLHDL 2.8 06/29/2023 1203   CHOLHDL 2.9 08/31/2015 1027   VLDL 23 08/31/2015 1027   LDLCALC 86 06/29/2023 1203    CBC    Component Value Date/Time   WBC 15.1 (H) 06/11/2023 0018   RBC 5.04 06/11/2023 0018   HGB 14.5 06/11/2023 0018   HGB 13.8 04/08/2022 1105   HCT 43.8 06/11/2023 0018   HCT 42.2 04/08/2022 1105   PLT 175 06/11/2023 0018   PLT 225 04/08/2022 1105   MCV 86.9 06/11/2023 0018   MCV 89 04/08/2022 1105   MCH 28.8 06/11/2023 0018   MCHC 33.1 06/11/2023 0018   RDW 12.8 06/11/2023 0018   RDW 12.5 04/08/2022 1105   LYMPHSABS 3.1 10/20/2019 1624   MONOABS 1.4 (H) 01/01/2014 1734   EOSABS 0.1 10/20/2019 1624   BASOSABS 0.1 10/20/2019 1624    ASSESSMENT AND PLAN: 1. Type 2 diabetes mellitus associated with morbid obesity (HCC) (Primary) Suboptimal glycemic control with fasting glucose 150-180 mg/dL, evening levels in 799d. Current regimen includes Lantus , Trulicity , and glipizide . Decision to increase Lantus  and switch to Ozempic for better control. - Increase Lantus  to 40 units daily. - Discontinue Trulicity . - Start Ozempic 0.5 mg weekly, potential increase after 2 weeks if tolerated. - Refer to pharmacy for Ozempic administration instructions. - Follow up in 2-3 weeks for potential dose adjustment. - glipiZIDE  (GLUCOTROL ) 5 MG tablet; Take 1 tablet (5 mg total) by mouth 2 (two) times daily before a meal.  Dispense: 90 tablet; Refill: 1 - insulin  glargine (LANTUS ) 100 UNIT/ML Solostar Pen; Inject 40 Units into the skin at bedtime.  Dispense: 15 mL; Refill: 5 - Semaglutide,0.25 or 0.5MG /DOS, 2 MG/3ML SOPN; Inject 0.5 mg into the skin once a week.  Dispense: 3 mL; Refill: 0  2. Insulin  long-term use (HCC) 3. Long-term (current) use of injectable non-insulin  antidiabetic drugs See #1 above.  4. Hyperlipidemia due to type 2 diabetes mellitus (HCC) -  atorvastatin  (LIPITOR) 20 MG tablet; Take 1 tablet (20 mg total) by mouth daily after supper.  Dispense: 90 tablet; Refill: 1  5. Hypertension associated with diabetes (HCC) At goal.  Continue amlodipine  10 mg daily.  Change lisinopril /HCTZ to 20/12.52 tablets daily.  Add low-dose of carvedilol 3.125 mg twice a day to make up for the decreased in the lisinopril /HCTZ. - amLODipine  (NORVASC ) 10 MG tablet; Take 1 tablet (10 mg total) by mouth daily.  Dispense: 90 tablet; Refill: 1 - furosemide  (LASIX ) 20 MG tablet; Take 1 tablet (20 mg total) by mouth daily.  Dispense: 90 tablet; Refill: 1 - lisinopril -hydrochlorothiazide  (ZESTORETIC ) 20-12.5 MG tablet; Take 2 tablets by mouth daily.  Dispense: 180 tablet; Refill: 1 - carvedilol (COREG) 3.125 MG tablet; Take 1 tablet (3.125 mg total) by mouth 2 (two) times daily with a meal.  Dispense: 60 tablet; Refill: 3 - Basic Metabolic Panel  6. Chest pain in adult Chest pain episodes sound concerning which occur with activity and relieved with rest.  She does have risk factors for heart disease.  Recommend referral to cardiology. - Ambulatory referral to Cardiology  7. Major depressive disorder, recurrent episode, mild (HCC) Not as controlled as she would like.  She will continue the Prozac .  She is agreeable to referral to behavioral health for counseling/CBT. - Ambulatory referral to Psychiatry  8. Need for vaccination against Streptococcus pneumoniae - Pneumococcal conjugate vaccine 20-valent  9. Need for shingles vaccine - Zoster Vaccine Adjuvanted (SHINGRIX ) injection; Inject 0.5 mLs into the muscle once for 1 dose.  Dispense: 0.5 mL; Refill: 0  Patient has applied for Medicaid and is awaiting decision.  If she does not get Medicaid, advised to apply for the [facility] discount.   Patient was given the opportunity to ask questions.  Patient verbalized understanding of the plan and was able to repeat key elements of the plan.   This documentation was  completed using Paediatric nurse.  Any transcriptional errors are unintentional.  Orders Placed This Encounter  Procedures   Basic Metabolic Panel   Ambulatory referral to Cardiology   Ambulatory referral to Psychiatry     Requested Prescriptions   Signed Prescriptions Disp Refills   amLODipine  (NORVASC ) 10 MG tablet 90 tablet 1    Sig: Take 1 tablet (10 mg total) by mouth daily.   atorvastatin  (LIPITOR) 20 MG tablet 90 tablet 1    Sig: Take 1 tablet (20 mg total) by mouth daily after supper.   furosemide  (LASIX ) 20 MG tablet 90 tablet 1    Sig: Take 1 tablet (20 mg total) by mouth daily.   glipiZIDE  (GLUCOTROL ) 5 MG tablet 90 tablet 1    Sig: Take 1 tablet (5 mg total) by mouth 2 (two) times daily before a meal.   insulin  glargine (LANTUS ) 100 UNIT/ML Solostar Pen 15 mL 5    Sig: Inject 40 Units into the skin at bedtime.   lisinopril -hydrochlorothiazide  (ZESTORETIC ) 20-12.5 MG tablet 180 tablet 1    Sig: Take 2 tablets by mouth daily.   Semaglutide,0.25 or 0.5MG /DOS, 2 MG/3ML SOPN 3 mL 0    Sig: Inject 0.5 mg into the skin once a week.   Zoster Vaccine Adjuvanted (SHINGRIX ) injection 0.5 mL 0    Sig: Inject 0.5 mLs into the muscle once for 1 dose.   carvedilol (COREG) 3.125 MG tablet 60 tablet 3    Sig: Take 1 tablet (3.125 mg total) by mouth 2 (two) times daily with a meal.    Return in about 3 months (around 12/22/2023) for 4 weeks with clinical pharmacist for DM.  Barnie Louder, MD, FACP

## 2023-09-21 NOTE — Patient Instructions (Signed)
 VISIT SUMMARY:  During your visit today, we reviewed your diabetes, hypertension, chest pain, cholesterol, and depression. We discussed adjustments to your medications and the need for further evaluations and vaccinations.  YOUR PLAN:  -TYPE 2 DIABETES MELLITUS: Your blood sugar levels are not well controlled. We are increasing your Lantus  dose to 40 units daily and switching from Trulicity  to Ozempic 0.5 mg weekly. Please follow up in 2-3 weeks for a potential dose adjustment and visit the pharmacy for instructions on how to use Ozempic.  -CHEST PAIN: You have been experiencing chest pain and shortness of breath with exertion. We are referring you to a cardiologist to ensure your heart is healthy. If you experience chest pain that does not go away with rest, seek emergency care immediately.  -HYPERTENSION: Your blood pressure readings have been high. We are adjusting your medication by reducing lisinopril /hydrochlorothiazide  to 2 tablets daily and adding carvedilol 3.125 mg twice daily. Please bring your home blood pressure device to your next visit to check its accuracy.  -HYPERLIPIDEMIA: Your cholesterol is being managed with atorvastatin . Continue taking atorvastatin  20 mg daily as prescribed.  -DEPRESSION: Your depression is somewhat controlled with fluoxetine , but additional support is needed. We are continuing fluoxetine  20 mg daily and referring you to behavioral health for counseling.  -GENERAL HEALTH MAINTENANCE: You are due for the pneumonia and shingles vaccines. We will administer the pneumonia vaccine today and provide a prescription for the shingles vaccine to be filled at the pharmacy. Your recent mammogram and ultrasound were normal. Please schedule your next routine mammogram in one year.  INSTRUCTIONS:  Please follow up in 2-3 weeks for a potential dose adjustment for your diabetes medication. Bring your home blood pressure device to your next visit to check its accuracy. Seek  emergency care if you experience chest pain that does not go away with rest. Schedule an appointment with the cardiologist and behavioral health as soon as possible.

## 2023-09-22 ENCOUNTER — Ambulatory Visit: Payer: Self-pay | Admitting: Internal Medicine

## 2023-09-22 LAB — BASIC METABOLIC PANEL WITH GFR
BUN/Creatinine Ratio: 18 (ref 9–23)
BUN: 17 mg/dL (ref 6–24)
CO2: 20 mmol/L (ref 20–29)
Calcium: 9.7 mg/dL (ref 8.7–10.2)
Chloride: 104 mmol/L (ref 96–106)
Creatinine, Ser: 0.92 mg/dL (ref 0.57–1.00)
Glucose: 158 mg/dL — ABNORMAL HIGH (ref 70–99)
Potassium: 3.8 mmol/L (ref 3.5–5.2)
Sodium: 143 mmol/L (ref 134–144)
eGFR: 75 mL/min/{1.73_m2} (ref >59)

## 2023-09-24 ENCOUNTER — Other Ambulatory Visit: Payer: Self-pay

## 2023-10-14 LAB — FECAL OCCULT BLOOD, IMMUNOCHEMICAL: Fecal Occult Bld: NEGATIVE

## 2023-10-15 ENCOUNTER — Ambulatory Visit: Payer: Self-pay

## 2023-10-16 ENCOUNTER — Telehealth: Payer: Self-pay | Admitting: Internal Medicine

## 2023-10-16 NOTE — Telephone Encounter (Signed)
 Called patient, no answer. Left voicemail confirming upcoming appointment on 10/20/2023 at 11:00 am with Clinical Pharmacist. Provided callback number for any questions or changes.

## 2023-10-20 ENCOUNTER — Ambulatory Visit: Payer: Self-pay | Admitting: Pharmacist

## 2023-10-23 ENCOUNTER — Other Ambulatory Visit: Payer: Self-pay

## 2023-10-23 ENCOUNTER — Other Ambulatory Visit: Payer: Self-pay | Admitting: Internal Medicine

## 2023-10-23 ENCOUNTER — Telehealth: Payer: Self-pay | Admitting: Internal Medicine

## 2023-10-23 MED ORDER — OZEMPIC (0.25 OR 0.5 MG/DOSE) 2 MG/3ML ~~LOC~~ SOPN
0.5000 mg | PEN_INJECTOR | SUBCUTANEOUS | 0 refills | Status: DC
  Filled 2023-10-23: qty 3, 28d supply, fill #0

## 2023-10-23 NOTE — Telephone Encounter (Signed)
 Copied from CRM [id number]. Topic: Clinical - Medication Question >> Oct 23, 2023  9:35 AM Burnard DEL wrote:  Reason for CRM: Patient called in stating that she is currently on ozempic  and tolerating the .5 we.SABRAShe was told she could have dosage increased if tolerated current dosage.She would like to know if she could have the increased dosage ,for she is doing well on current dosage? [facility]  Phone: [phone] Fax: [phone]

## 2023-10-24 ENCOUNTER — Other Ambulatory Visit: Payer: Self-pay

## 2023-10-24 MED ORDER — SEMAGLUTIDE (1 MG/DOSE) 4 MG/3ML ~~LOC~~ SOPN
1.0000 mg | PEN_INJECTOR | SUBCUTANEOUS | 3 refills | Status: DC
  Filled 2023-10-24 – 2023-11-17 (×4): qty 3, 28d supply, fill #0
  Filled 2023-12-03: qty 9, 84d supply, fill #0

## 2023-10-24 NOTE — Addendum Note (Signed)
 Addended by: VICCI SOBER B on: 10/24/2023 02:23 PM   Modules accepted: Orders

## 2023-10-24 NOTE — Telephone Encounter (Signed)
 Prescription sent to the pharmacy for Ozempic  1 mg to take once a week.

## 2023-10-26 ENCOUNTER — Other Ambulatory Visit: Payer: Self-pay

## 2023-10-26 NOTE — Telephone Encounter (Signed)
 Called but no answer. LVM to call back.

## 2023-10-28 ENCOUNTER — Other Ambulatory Visit: Payer: Self-pay

## 2023-10-29 ENCOUNTER — Other Ambulatory Visit: Payer: Self-pay

## 2023-10-29 NOTE — Telephone Encounter (Signed)
 Called & spoke to the patient. Verified name & DOB. Informed that the prescription for Ozempic  1 mg to take once a week was sent to the pharmacy. Patient expressed verbal understanding. Patient stated that she is currently waiting for the prior authorization to be approved. Patient will call back if there are any further questions or concerns.

## 2023-10-30 ENCOUNTER — Other Ambulatory Visit: Payer: Self-pay

## 2023-11-03 ENCOUNTER — Telehealth: Payer: Self-pay | Admitting: Internal Medicine

## 2023-11-03 NOTE — Telephone Encounter (Signed)
 Called pt to confirm appt for 8/7 LVM

## 2023-11-05 ENCOUNTER — Telehealth: Payer: Self-pay | Admitting: Pharmacist

## 2023-11-05 ENCOUNTER — Encounter: Payer: Self-pay | Admitting: Pharmacist

## 2023-11-05 ENCOUNTER — Other Ambulatory Visit: Payer: Self-pay

## 2023-11-05 ENCOUNTER — Ambulatory Visit: Payer: Self-pay | Attending: Family Medicine | Admitting: Pharmacist

## 2023-11-05 DIAGNOSIS — Z794 Long term (current) use of insulin: Secondary | ICD-10-CM | POA: Diagnosis not present

## 2023-11-05 DIAGNOSIS — Z7984 Long term (current) use of oral hypoglycemic drugs: Secondary | ICD-10-CM | POA: Diagnosis not present

## 2023-11-05 DIAGNOSIS — E1169 Type 2 diabetes mellitus with other specified complication: Secondary | ICD-10-CM | POA: Diagnosis not present

## 2023-11-05 DIAGNOSIS — Z7985 Long-term (current) use of injectable non-insulin antidiabetic drugs: Secondary | ICD-10-CM | POA: Diagnosis not present

## 2023-11-05 LAB — POCT GLYCOSYLATED HEMOGLOBIN (HGB A1C): HbA1c, POC (controlled diabetic range): 7.4 % — AB (ref 0.0–7.0)

## 2023-11-05 MED ORDER — OZEMPIC (0.25 OR 0.5 MG/DOSE) 2 MG/3ML ~~LOC~~ SOPN
0.5000 mg | PEN_INJECTOR | SUBCUTANEOUS | 0 refills | Status: DC
  Filled 2023-11-05: qty 3, 28d supply, fill #0

## 2023-11-05 NOTE — Progress Notes (Signed)
 S:     No chief complaint on file.  53 y.o. female who presents for diabetes evaluation, education, and management. Patient arrives in good spirits and presents without any assistance.   Patient was referred and last seen by Primary Care Provider, Dr. Vicci, on 09/21/2023. Last A1c was back in March and was 10.7%.   PMH is significant for HTN, T2DM, obesity. Patient reports Diabetes is longstanding. I last saw her back in June of last year. Was initially managed via PO medication now on insulin  + GLP-1 RA therapy.  Has an intolerance to metformin  (GI intolerance). Has been on insulin  since ~2015-16. No hx of thyroid  cancer or pancreatitis. She has no known hx of clinical ASCVD, CHF, or CKD.   She has been taking Lantus  and glipizide  consistently. Took Ozempic  at the 0.5 mg weekly dose for ~1 month but ran out. Denies any NV, abdominal pain when taking. No changes in vision. She is in the process of getting MAP approved in our pharmacy.  Family/Social History:  Fhx: heart disease, stroke, CAD, HTN Tobacco: never smoker  Alcohol: none  Current diabetes medications include: glipizide  5 mg BID, Lantus  40 units daily, Ozempic  1 mg weekly (not taking) Patient admits to non-adherence to Ozempic . Was last dispensed 09/24/23 for a 4 week supply but has not refilled. She is taking glipizide  and Lantus . In the process of applying for patient assistance for Ozempic .   Insurance coverage: self pay  Patient denies hypoglycemic events.  Reported home fasting blood sugars: none   Reported 2 hour post-meal/random blood sugars: none.  Patient denies nocturia (nighttime urination).  Patient reports neuropathy (nerve pain). Patient denies visual changes. Patient reports self foot exams.   Patient reported dietary habits: -Admits to some dietary indiscretion (sweets) but tries to maintain a diabetic diet  Patient-reported exercise habits:  -Very active at her job. She works as a Financial trader.    O:  No CGM in place.   Lab Results  Component Value Date   HGBA1C 7.4 (A) 11/05/2023   There were no vitals filed for this visit.  Lipid Panel     Component Value Date/Time   CHOL 158 06/29/2023 1203   TRIG 86 06/29/2023 1203   HDL 56 06/29/2023 1203   CHOLHDL 2.8 06/29/2023 1203   CHOLHDL 2.9 08/31/2015 1027   VLDL 23 08/31/2015 1027   LDLCALC 86 06/29/2023 1203   Clinical Atherosclerotic Cardiovascular Disease (ASCVD): No  The 10-year ASCVD risk score (Arnett DK, et al., 2019) is: 5.8%   Values used to calculate the score:     Age: 35 years     Clincally relevant sex: Female     Is Non-Hispanic African American: Yes     Diabetic: Yes     Tobacco smoker: No     Systolic Blood Pressure: 120 mmHg     Is BP treated: Yes     HDL Cholesterol: 56 mg/dL     Total Cholesterol: 158 mg/dL   Patient is participating in a Managed Medicaid Plan:  Yes   A/P: Diabetes longstanding with improved control. A1c today is 7.4, down from 10.7 prior. Commended her for this! She is asymptomatic at this time. She does not have any hypoglycemia but is able to verbalize appropriate hypoglycemia management plan. Medication adherence appears to be okay. We will restart Ozempic  at the 0.5 mg weekly dose since she's been out for a month. I have messaged pharmacy and they are assisting with her MAP enrollment.  We will have her increase to the 1 mg weekly dose after completing 1 month of the 0.5 mg dose.  She is to continue her current glargine and glipizide  doses for now.  -Restarted Ozempic  at the 0.5 mg weekly dose. Continue for 4 weeks then increase to 1 mg weekly thereafter.  -Continued Lantus  40 units daily.  -Continue glipziide 5 mg BID for now.  -Patient educated on purpose, proper use, and potential adverse effects of Ozempic .  -Extensively discussed pathophysiology of diabetes, recommended lifestyle interventions, dietary effects on blood sugar control.  -Counseled on s/sx of and management  of hypoglycemia.  -Next A1c anticipated 01/2024.   Written patient instructions provided. Patient verbalized understanding of treatment plan.  Total time in face to face counseling 30 minutes.    Follow-up:  Pharmacist in 1 month PCP clinic visit 11/12/2023  Herlene Fleeta Morris, PharmD, BCACP, CPP Clinical Pharmacist [facility] [phone]

## 2023-11-05 NOTE — Telephone Encounter (Signed)
 Just for tracking, friend. Will you let me know if/when MAP for Ozempic  is approved?

## 2023-11-06 ENCOUNTER — Other Ambulatory Visit: Payer: Self-pay

## 2023-11-10 ENCOUNTER — Other Ambulatory Visit: Payer: Self-pay

## 2023-11-12 ENCOUNTER — Other Ambulatory Visit: Payer: Self-pay

## 2023-11-12 ENCOUNTER — Ambulatory Visit: Payer: Self-pay | Attending: Internal Medicine | Admitting: Internal Medicine

## 2023-11-12 ENCOUNTER — Encounter: Payer: Self-pay | Admitting: Internal Medicine

## 2023-11-12 VITALS — BP 144/84 | HR 62 | Temp 98.0°F | Ht 64.0 in | Wt 243.0 lb

## 2023-11-12 DIAGNOSIS — R600 Localized edema: Secondary | ICD-10-CM

## 2023-11-12 DIAGNOSIS — Z794 Long term (current) use of insulin: Secondary | ICD-10-CM | POA: Diagnosis not present

## 2023-11-12 DIAGNOSIS — E1159 Type 2 diabetes mellitus with other circulatory complications: Secondary | ICD-10-CM

## 2023-11-12 DIAGNOSIS — Z7985 Long-term (current) use of injectable non-insulin antidiabetic drugs: Secondary | ICD-10-CM | POA: Diagnosis not present

## 2023-11-12 DIAGNOSIS — M25562 Pain in left knee: Secondary | ICD-10-CM

## 2023-11-12 DIAGNOSIS — I152 Hypertension secondary to endocrine disorders: Secondary | ICD-10-CM | POA: Diagnosis not present

## 2023-11-12 DIAGNOSIS — Z7984 Long term (current) use of oral hypoglycemic drugs: Secondary | ICD-10-CM | POA: Diagnosis not present

## 2023-11-12 DIAGNOSIS — M79605 Pain in left leg: Secondary | ICD-10-CM

## 2023-11-12 MED ORDER — CYCLOBENZAPRINE HCL 5 MG PO TABS
5.0000 mg | ORAL_TABLET | Freq: Two times a day (BID) | ORAL | 1 refills | Status: AC | PRN
  Filled 2023-11-12: qty 30, 15d supply, fill #0

## 2023-11-12 NOTE — Progress Notes (Signed)
 Patient ID: Julia Dunn, female    DOB: 1970-09-23  MRN: [id number]  CC: Leg Pain (Bilateral leg pain - L leg is worse X2 weeks)   Subjective: Julia Dunn is a 53 y.o. female who presents for UC visit management. Her concerns today include:  Pt with hx of HTN, DM 2, obesity, hx abn MMG (12/2020)   Discussed the use of AI scribe software for clinical note transcription with the patient, who gave verbal consent to proceed.  History of Present Illness Julia Dunn is a 53 year old female who presents with acute leg pain and swelling.  She has been experiencing severe leg pain and swelling for the past two to three weeks, primarily affecting her left leg. Slight discomfort in RT leg. The pain is described as a sensation of muscle tension and tightness, making it difficult for her to move her leg without  having to pick it up using her hands when getting in and out of vehicle.  It is present in both the front and back of the thigh, as well as the calf, though less severe in the calf. No numbness or tingling in the legs, but there is tenderness in the LT knee and thigh areas. She reports that she is unable to perform squats or kneel comfortably due to difficulty getting up and down.  She is able to get up and down the stairs with some discomfort  The symptoms began shortly after her last medical visit and have not improved with the use of a hand massager, Voltaren  gel, Tiger Balm, or lidocaine  patches, which provide only slight relief. Compression sleeves for her calves help somewhat, but significant swelling persists. She recently traveled to [place]  for a family reunion, which involved a lot of activity, and notes that the swelling was present before the trip and worsened afterward. She required the use of a cane to assist with walking during the trip.  She has been taking furosemide  (prescribed as one tab daily) more frequently, two pills for the past three days, to manage the swelling, but still  feels bloated. She experiences difficulty with activities such as standing up from a seated position, getting in and out of a car, and kneeling. Muscle cramps, described as 'Charlie horses,' have been more frequent recently, particularly when she does not maintain her electrolyte balance.  Blood pressure noted to be elevated today.  She did not take her medicines as yet for today.  She has been checking blood pressure daily and reports that readings have been around 130/80.    Patient Active Problem List   Diagnosis Date Noted   Depression, major, recurrent, moderate (HCC) 06/13/2022   Bilateral hand pain 08/09/2021   Right carpal tunnel syndrome 08/09/2021   Left carpal tunnel syndrome 08/09/2021   Acute on chronic diastolic congestive heart failure (HCC) 08/03/2021   Closed displaced bimalleolar fracture of left ankle 01/04/2016   Abnormal EKG 06/22/2014   Abnormal CT scan of lung 06/22/2014   Dyspnea 06/19/2014   Microscopic hematuria 04/07/2014   Onychomycosis 05/31/2013   BI-RADS category 3 mammogram result 03/07/2013   Vitamin D  insufficiency 08/13/2011   Morbid obesity (HCC) 08/10/2007   ALLERGIC RHINITIS, SEASONAL 07/14/2006   DM2 (diabetes mellitus, type 2) 05/28/2006   DEPRESSIVE DISORDER, NOS 05/28/2006   HTN (hypertension) 05/28/2006     Current Outpatient Medications on File Prior to Visit  Medication Sig Dispense Refill   albuterol  (VENTOLIN  HFA) 108 (90 Base) MCG/ACT inhaler Inhale 2 puffs into  the lungs every 6 (six) hours as needed for wheezing or shortness of breath. 6.7 g 1   amLODipine  (NORVASC ) 10 MG tablet Take 1 tablet (10 mg total) by mouth daily. 90 tablet 1   atorvastatin  (LIPITOR) 20 MG tablet Take 1 tablet (20 mg total) by mouth daily after supper. 90 tablet 1   carvedilol  (COREG ) 3.125 MG tablet Take 1 tablet (3.125 mg total) by mouth 2 (two) times daily with a meal. 60 tablet 3   diclofenac  Sodium (VOLTAREN ) 1 % GEL Apply 4 g topically 4 (four) times  daily as needed. 500 g 6   FLUoxetine  (PROZAC ) 20 MG capsule Take 1 capsule (20 mg total) by mouth daily. 30 capsule 1   furosemide  (LASIX ) 20 MG tablet Take 1 tablet (20 mg total) by mouth daily. 90 tablet 1   glipiZIDE  (GLUCOTROL ) 5 MG tablet Take 1 tablet (5 mg total) by mouth 2 (two) times daily before a meal. 90 tablet 1   insulin  glargine (LANTUS ) 100 UNIT/ML Solostar Pen Inject 40 Units into the skin at bedtime. 15 mL 5   Insulin  Pen Needle (TRUEPLUS PEN NEEDLES) 32G X 4 MM MISC Use to inject insulin  daily. 100 each 11   lisinopril -hydrochlorothiazide  (ZESTORETIC ) 20-12.5 MG tablet Take 2 tablets by mouth daily. 180 tablet 1   omeprazole  (PRILOSEC) 20 MG capsule TAKE 1 CAPSULE (20 MG TOTAL) BY MOUTH DAILY. 30 capsule 0   ONE TOUCH ULTRA TEST test strip USE  STRIP TO CHECK GLUCOSE THREE TIMES DAILY BEFORE MEAL(S) 100 each 5   Semaglutide , 1 MG/DOSE, 4 MG/3ML SOPN Inject 1 mg as directed once a week. 3 mL 3   Semaglutide ,0.25 or 0.5MG /DOS, (OZEMPIC , 0.25 OR 0.5 MG/DOSE,) 2 MG/3ML SOPN Inject 0.5 mg into the skin once a week. 3 mL 0   Vitamin D , Ergocalciferol , (DRISDOL ) 1.25 MG (50000 UNIT) CAPS capsule Take 1 capsule (50,000 Units total) by mouth every 7 (seven) days. 4 capsule 2   valACYclovir  (VALTREX ) 500 MG tablet Take 2 tablets (1,000 mg total) by mouth daily. (Patient not taking: Reported on 11/12/2023) 30 tablet 11   No current facility-administered medications on file prior to visit.    Allergies  Allergen Reactions   Metformin  And Related Other (See Comments)    GI upset even with low dose.    Social History   Socioeconomic History   Marital status: Significant Other    Spouse name: Not on file   Number of children: 3   Years of education: Not on file   Highest education level: Not on file  Occupational History   Occupation: Unemployed  Tobacco Use   Smoking status: Never   Smokeless tobacco: Never  Vaping Use   Vaping status: Never Used  Substance and Sexual  Activity   Alcohol use: Yes    Comment: social   Drug use: No   Sexual activity: Yes    Birth control/protection: Surgical    Comment: hysterectomy  Other Topics Concern   Not on file  Social History Narrative   Not on file   Social Drivers of Health   Financial Resource Strain: Not on file  Food Insecurity: No Food Insecurity (07/28/2023)   Hunger Vital Sign    Worried About Running Out of Food in the Last Year: Never true    Ran Out of Food in the Last Year: Never true  Transportation Needs: No Transportation Needs (07/28/2023)   PRAPARE - Administrator, Civil Service (Medical): No  Lack of Transportation (Non-Medical): No  Physical Activity: Not on file  Stress: Not on file  Social Connections: Unknown (06/16/2022)   Received from [facility]   Social Network    Social Network: Not on file  Intimate Partner Violence: Not At Risk (06/29/2023)   Humiliation, Afraid, Rape, and Kick questionnaire    Fear of Current or Ex-Partner: No    Emotionally Abused: No    Physically Abused: No    Sexually Abused: No    Family History  Problem Relation Age of Onset   Heart disease Mother    Rheumatic fever Mother    Hypertension Mother    Stroke Mother    CAD Mother    Hypertension Father    Hypertension Brother    Stroke Brother    Sickle cell trait Daughter    Heart disease Paternal Aunt    Breast cancer Paternal Aunt 38 - 13   Breast cancer Paternal Grandmother 64 - 71    Past Surgical History:  Procedure Laterality Date   ABDOMINAL HYSTERECTOMY N/A 11/03/2012   Procedure: HYSTERECTOMY ABDOMINAL;  Surgeon: Rome Rigg, MD;  Location: [facility];  Service: Gynecology;  Laterality: N/A;   BILATERAL SALPINGECTOMY Bilateral 11/03/2012   Procedure: BILATERAL SALPINGECTOMY;  Surgeon: Rome Rigg, MD;  Location: [facility];  Service: Gynecology;  Laterality: Bilateral;   BURCH PROCEDURE N/A 11/03/2012   Procedure: BURCH PROCEDURE;  Surgeon: Rome Rigg, MD;   Location: [facility];  Service: Gynecology;  Laterality: N/A;   CARPAL TUNNEL RELEASE Right 11/20/2021   Procedure: RIGHT CARPAL TUNNEL RELEASE;  Surgeon: Jerri Kay HERO, MD;  Location: [facility];  Service: Orthopedics;  Laterality: Right;   CYSTOSCOPY N/A 11/03/2012   Procedure: CYSTOSCOPY;  Surgeon: Rome Rigg, MD;  Location: [facility];  Service: Gynecology;  Laterality: N/A;   DILATION AND CURETTAGE OF UTERUS     LAPAROSCOPY N/A 11/03/2012   Procedure: LAPAROSCOPY DIAGNOSTIC;  Surgeon: Rome Rigg, MD;  Location: [facility];  Service: Gynecology;  Laterality: N/A;   ORIF ANKLE FRACTURE Left 01/04/2016   Procedure: OPEN REDUCTION INTERNAL FIXATION (ORIF) LEFT BIMALLEOLAR ANKLE FRACTURE;  Surgeon: Lonni CINDERELLA Poli, MD;  Location: [facility];  Service: Orthopedics;  Laterality: Left;   TUBAL LIGATION     WISDOM TOOTH EXTRACTION     3 teeth    ROS: Review of Systems Negative except as stated above  PHYSICAL EXAM: BP (!) 144/84 (BP Location: Left Arm, Patient Position: Sitting, Cuff Size: Large)   Pulse 62   Temp 98 F (36.7 C) (Oral)   Ht 5' 4 (1.626 m)   Wt 243 lb (110.2 kg)   LMP 06/29/2012   SpO2 97%   BMI 41.71 kg/m   Physical Exam  General appearance - alert, well appearing, middle-age African-American female and in no distress Mental status - normal mood, behavior, speech, dress, motor activity, and thought processes Musculoskeletal -left knee: No edema or erythema.  She has mild to moderate tenderness over the medial and lateral joint lines and slight tenderness on palpation over the kneecap.  However she has good range of motion without apparent difficulty. Trace edema in the lower legs.  No edema noted in the ankles or feet.  Legs are warm to touch. [place]' sign negative bilaterally.  She has slight tenderness on palpation of the left thigh muscles.  Left lower leg appears just a smidge larger than the right Pulses: Dorsalis pedis, posterior tibialis, popliteal  and femoral pulses are 3+ bilaterally.  Legs  are warm. Neuro: Power in both lower extremities proximally and distally 5/5 bilaterally.    Latest Ref Rng & Units 09/21/2023   10:14 AM 06/11/2023   12:18 AM 04/23/2023    1:10 PM  CMP  Glucose 70 - 99 mg/dL 841  771  592   BUN 6 - 24 mg/dL 17  7  20    Creatinine 0.57 - 1.00 mg/dL 9.07  9.23  8.97   Sodium 134 - 144 mmol/L 143  139  137   Potassium 3.5 - 5.2 mmol/L 3.8  3.6  4.3   Chloride 96 - 106 mmol/L 104  100  97   CO2 20 - 29 mmol/L 20  25  29    Calcium  8.7 - 10.2 mg/dL 9.7  8.9  9.4   Total Protein 6.5 - 8.1 g/dL  6.9  7.1   Total Bilirubin 0.0 - 1.2 mg/dL  1.6  0.8   Alkaline Phos 38 - 126 U/L  54  77   AST 15 - 41 U/L  28  14   ALT 0 - 44 U/L  43  19    Lipid Panel     Component Value Date/Time   CHOL 158 06/29/2023 1203   TRIG 86 06/29/2023 1203   HDL 56 06/29/2023 1203   CHOLHDL 2.8 06/29/2023 1203   CHOLHDL 2.9 08/31/2015 1027   VLDL 23 08/31/2015 1027   LDLCALC 86 06/29/2023 1203    CBC    Component Value Date/Time   WBC 15.1 (H) 06/11/2023 0018   RBC 5.04 06/11/2023 0018   HGB 14.5 06/11/2023 0018   HGB 13.8 04/08/2022 1105   HCT 43.8 06/11/2023 0018   HCT 42.2 04/08/2022 1105   PLT 175 06/11/2023 0018   PLT 225 04/08/2022 1105   MCV 86.9 06/11/2023 0018   MCV 89 04/08/2022 1105   MCH 28.8 06/11/2023 0018   MCHC 33.1 06/11/2023 0018   RDW 12.8 06/11/2023 0018   RDW 12.5 04/08/2022 1105   LYMPHSABS 3.1 10/20/2019 1624   MONOABS 1.4 (H) 01/01/2014 1734   EOSABS 0.1 10/20/2019 1624   BASOSABS 0.1 10/20/2019 1624    ASSESSMENT AND PLAN: 1. Left leg pain (Primary) Patient presenting with pain in both legs which is significantly greater on the left side x 2 to 3 weeks.  Differential diagnoses include muscle strain, statin induced myopathy or other myopathy.  Also need to rule out the possibility of DVT given recent travel with reported increased swelling.  Will check some blood test including muscle enzymes  and sed rate.  Ordered Doppler ultrasound. Hold statin for the next month.  I will see her back at that time Trial of muscle relaxant Flexeril  to use as needed.  Advised medicine can cause drowsiness.  Take with Tylenol  instead of NSAIDs - CK - Aldolase - VAS US  LOWER EXTREMITY VENOUS (DVT); Future - cyclobenzaprine  (FLEXERIL ) 5 MG tablet; Take 1 tablet (5 mg total) by mouth 2 (two) times daily as needed for muscle spasms.  Dispense: 30 tablet; Refill: 1 - Sedimentation Rate  2. Edema of both legs This is very slight today.  However patient states that she did increase taking furosemide  2 tablets daily for the past several days.  This may have increased during her trip recent travel to [place]  due to dependent edema  3. Acute pain of left knee - DG Knee Complete 4 Views Left; Future  4. Hypertension associated with diabetes (HCC) Not at goal but improved on repeat check.  No changes made in medicines today as patient states she did not take her medications as yet for the morning and reports blood pressure readings around 130/80 daily at home.  We will plan to recheck blood pressure on follow-up visit in 1 month. - Basic Metabolic Panel  Patient was given the opportunity to ask questions.  Patient verbalized understanding of the plan and was able to repeat key elements of the plan.   This documentation was completed using Paediatric nurse.  Any transcriptional errors are unintentional.  Orders Placed This Encounter  Procedures   DG Knee Complete 4 Views Left   CK   Aldolase   Basic Metabolic Panel   Sedimentation Rate   VAS US  LOWER EXTREMITY VENOUS (DVT)     Requested Prescriptions   Signed Prescriptions Disp Refills   cyclobenzaprine  (FLEXERIL ) 5 MG tablet 30 tablet 1    Sig: Take 1 tablet (5 mg total) by mouth 2 (two) times daily as needed for muscle spasms.    Return in about 1 month (around 12/13/2023).  Barnie Louder, MD, FACP

## 2023-11-12 NOTE — Patient Instructions (Signed)
 VISIT SUMMARY:  Today, you were seen for severe leg pain and swelling that has been affecting your left leg for the past two to three weeks. The pain and swelling have made it difficult for you to move your leg without assistance, and you have been experiencing muscle cramps and tightness. You recently traveled to [place] , which worsened the swelling. You have been using various treatments with limited relief and have been taking furosemide  more frequently to manage the swelling.  YOUR PLAN:  -LEFT THIGH AND KNEE PAIN WITH MUSCLE TIGHTNESS AND TENDERNESS: You have been experiencing pain and tightness in your left thigh and knee, which is severe enough to interfere with your daily activities. This could be due to muscle strain, inflammation, or a side effect of your medication. We will check your muscle enzymes with a blood test to look for inflammation. You have been prescribed cyclobenzaprine  to help relax your muscles, but be cautious as it may cause drowsiness. For pain management, use acetaminophen  and avoid NSAIDs.  -BILATERAL LOWER EXTREMITY SWELLING: You have swelling in both legs, more pronounced in the left leg, which worsened after your recent travel. We will perform a Doppler ultrasound of your left leg to rule out a blood clot. Continue using compression sleeves to help manage the swelling.  -STATIN-ASSOCIATED MYOPATHY, SUSPECTED: We suspect that your muscle cramps and tightness may be due to your atorvastatin  medication. Statin-associated myopathy is a condition where statin medications cause muscle pain and weakness. We will stop your atorvastatin  for one month to see if your muscle symptoms improve.  -HYPERTENSION: Your blood pressure has been elevated but generally decreases with medication and rest. Hypertension is high blood pressure, which can be managed with medication and lifestyle changes. Continue taking your blood pressure medications as prescribed and monitor your blood pressure  regularly.  INSTRUCTIONS:  1. Get the blood tests done to check for muscle enzymes. 2. Schedule and complete the Doppler ultrasound of your left leg. 3. Stop taking atorvastatin  for one month and monitor for any improvement in muscle symptoms. 4. Continue using compression sleeves for your legs. 5. Take cyclobenzaprine  as prescribed for muscle relaxation, but be cautious of drowsiness. 6. Use acetaminophen  for pain management and avoid NSAIDs. 7. Continue taking your blood pressure medications as prescribed and monitor your blood pressure regularly.

## 2023-11-13 ENCOUNTER — Ambulatory Visit: Payer: Self-pay | Admitting: Internal Medicine

## 2023-11-13 ENCOUNTER — Ambulatory Visit (HOSPITAL_COMMUNITY)
Admission: RE | Admit: 2023-11-13 | Discharge: 2023-11-13 | Disposition: A | Discharge status: Home / Self Care | Payer: Self-pay | Source: Ambulatory Visit | Attending: Cardiology | Admitting: Cardiology

## 2023-11-13 ENCOUNTER — Ambulatory Visit (HOSPITAL_COMMUNITY)
Admission: RE | Admit: 2023-11-13 | Discharge: 2023-11-13 | Disposition: A | Discharge status: Home / Self Care | Payer: Self-pay | Source: Ambulatory Visit | Attending: Internal Medicine | Admitting: Internal Medicine

## 2023-11-13 DIAGNOSIS — M25462 Effusion, left knee: Secondary | ICD-10-CM

## 2023-11-13 DIAGNOSIS — M25562 Pain in left knee: Secondary | ICD-10-CM

## 2023-11-13 DIAGNOSIS — M79605 Pain in left leg: Secondary | ICD-10-CM

## 2023-11-13 LAB — BASIC METABOLIC PANEL WITH GFR
BUN/Creatinine Ratio: 18 (ref 9–23)
BUN: 16 mg/dL (ref 6–24)
CO2: 23 mmol/L (ref 20–29)
Calcium: 9.2 mg/dL (ref 8.7–10.2)
Chloride: 103 mmol/L (ref 96–106)
Creatinine, Ser: 0.89 mg/dL (ref 0.57–1.00)
Glucose: 154 mg/dL — ABNORMAL HIGH (ref 70–99)
Potassium: 4.2 mmol/L (ref 3.5–5.2)
Sodium: 141 mmol/L (ref 134–144)
eGFR: 78 mL/min/1.73 (ref >59)

## 2023-11-13 LAB — CK: Total CK: 483 U/L — ABNORMAL HIGH (ref 32–182)

## 2023-11-13 LAB — SEDIMENTATION RATE: Sed Rate: 12 mm/h (ref 0–40)

## 2023-11-13 LAB — ALDOLASE: Aldolase: 6.6 U/L (ref 3.3–10.3)

## 2023-11-16 ENCOUNTER — Other Ambulatory Visit: Payer: Self-pay

## 2023-11-16 NOTE — Telephone Encounter (Signed)
 Received notification from [employer] regarding approval for [employer] . Patient assistance approved from 11/13/2023 to 11/06/2024.  Medication will ship to [facility] [address], [id number]  Pt ID: [id number]  Company phone: [phone]

## 2023-11-17 ENCOUNTER — Other Ambulatory Visit: Payer: Self-pay

## 2023-11-18 ENCOUNTER — Other Ambulatory Visit: Payer: Self-pay

## 2023-11-27 ENCOUNTER — Other Ambulatory Visit: Payer: Self-pay | Admitting: Internal Medicine

## 2023-11-27 DIAGNOSIS — M25562 Pain in left knee: Secondary | ICD-10-CM

## 2023-12-02 ENCOUNTER — Ambulatory Visit (HOSPITAL_COMMUNITY): Payer: Self-pay | Admitting: Mental Health

## 2023-12-02 ENCOUNTER — Encounter (HOSPITAL_COMMUNITY): Payer: Self-pay

## 2023-12-03 ENCOUNTER — Other Ambulatory Visit: Payer: Self-pay

## 2023-12-04 ENCOUNTER — Other Ambulatory Visit: Payer: Self-pay

## 2023-12-11 ENCOUNTER — Ambulatory Visit (INDEPENDENT_AMBULATORY_CARE_PROVIDER_SITE_OTHER): Payer: Self-pay | Admitting: Orthopaedic Surgery

## 2023-12-11 DIAGNOSIS — G8929 Other chronic pain: Secondary | ICD-10-CM | POA: Diagnosis not present

## 2023-12-11 DIAGNOSIS — Z419 Encounter for procedure for purposes other than remedying health state, unspecified: Secondary | ICD-10-CM | POA: Diagnosis not present

## 2023-12-11 DIAGNOSIS — G5602 Carpal tunnel syndrome, left upper limb: Secondary | ICD-10-CM | POA: Diagnosis not present

## 2023-12-11 DIAGNOSIS — M25562 Pain in left knee: Secondary | ICD-10-CM

## 2023-12-11 NOTE — Progress Notes (Signed)
 Office Visit Note   Patient: Julia Dunn           Date of Birth: July 28, 1970           MRN: [id number] Visit Date: 12/11/2023              Requested by: [name], MD [address] [place] [address] [place],  [place] [address] PCP: [name], MD   Assessment & Plan: Visit Diagnoses:  1. Chronic pain of left knee   2. Left carpal tunnel syndrome     Plan: History of Present Illness Julia Dunn is a 53 year old female who presents with left knee pain and swelling.  She has experienced left knee pain for two months, located on the medial aspect below the kneecap, with accompanying swelling, numbness, and tenderness. She experiences clicking, locking, and catching sensations, and recalls a 'pop' sound during slight movements, particularly while ascending stairs, followed by a sensation of pressure release and an inability to move her leg.  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 She manages her symptoms with Flexeril  and Aleve . Previous x-rays indicated a joint effusion.  Physical Exam MUSCULOSKELETAL: Joint effusion and tenderness at the medial joint line of the left knee.  Results LABS HbA1c: 10.7 (06/2023) HbA1c: 7.1  RADIOLOGY Knee X-ray: Normal  DIAGNOSTIC Nerve conduction study: Severe carpal tunnel syndrome (2021)  Assessment and Plan Left knee medial meniscus tear with locking requiring manual straightening Suspected medial meniscus tear with pain, swelling, numbness, tenderness, joint effusion, popping, clicking, and locking. X-rays normal, further imaging needed. - Order MRI of the left knee to evaluate for medial meniscus tear.  Left carpal tunnel syndrome Severe left carpal tunnel syndrome confirmed by 2021 nerve study. She desires surgery despite financial concerns. Weight not a contraindication; diabetes must be controlled. - Ensure diabetes is under good control prior to surgery.  Follow-Up  Instructions: No follow-ups on file.   Orders:  No orders of the defined types were placed in this encounter.  No orders of the defined types were placed in this encounter.     Procedures: No procedures performed   Clinical Data: No additional findings.   Subjective: Chief Complaint  Patient presents with   Left Knee - Pain    Imaging: No results found.   PMFS History: Patient Active Problem List   Diagnosis Date Noted   Depression, major, recurrent, moderate (HCC) 06/13/2022   Bilateral hand pain 08/09/2021   Right carpal tunnel syndrome 08/09/2021   Left carpal tunnel syndrome 08/09/2021   Acute on chronic diastolic congestive heart failure (HCC) 08/03/2021   Closed displaced bimalleolar fracture of left ankle 01/04/2016   Abnormal EKG 06/22/2014   Abnormal CT scan of lung 06/22/2014   Dyspnea 06/19/2014   Microscopic hematuria 04/07/2014   Onychomycosis 05/31/2013   BI-RADS category 3 mammogram result 03/07/2013   Vitamin D  insufficiency 08/13/2011   Morbid obesity (HCC) 08/10/2007   ALLERGIC RHINITIS, SEASONAL 07/14/2006   DM2 (diabetes mellitus, type 2) 05/28/2006   DEPRESSIVE DISORDER, NOS 05/28/2006   HTN (hypertension) 05/28/2006   Past Medical History:  Diagnosis Date   Anemia Dx 2013   Asthma    Bacterial vaginosis 2008   Complication of anesthesia    pt states was informed had difficult time awakening following hysterectomy    Depression    Depression    Diabetes mellitus Dx 2005   Fibroids 09/05/2011   08/08/2011: Anteverted  uterus. Diffuse fibroid involvement. Left of midline fibroid with submucosal component. 3.4cm x 2.8cmx 3.4cm.    GERD (gastroesophageal reflux disease)    Gestational diabetes    H/O shortness of breath    pt states has improved since lossing weight; triggers currently are enviromental pt states can make it to top of stairs w/o having to stop to catch her breath   Headache(784.0)    migraines   History of blood  transfusion 2013/2014   History of bronchitis    Hx of blood clots    Hypertension Dx 2015   Kidney infection 2015   Menorrhagia 09/27/2010   Pneumonia    Postpartum depression    Pregnancy induced hypertension    Sleep apnea    mild, no CPAP recommended   Trichomonas     Family History  Problem Relation Age of Onset   Heart disease Mother    Rheumatic fever Mother    Hypertension Mother    Stroke Mother    CAD Mother    Hypertension Father    Hypertension Brother    Stroke Brother    Sickle cell trait Daughter    Heart disease Paternal Aunt    Breast cancer Paternal Aunt 73 - 24   Breast cancer Paternal Grandmother 84 - 46    Past Surgical History:  Procedure Laterality Date   ABDOMINAL HYSTERECTOMY N/A 11/03/2012   Procedure: HYSTERECTOMY ABDOMINAL;  Surgeon: Rome Rigg, MD;  Location: [facility];  Service: Gynecology;  Laterality: N/A;   BILATERAL SALPINGECTOMY Bilateral 11/03/2012   Procedure: BILATERAL SALPINGECTOMY;  Surgeon: Rome Rigg, MD;  Location: [facility];  Service: Gynecology;  Laterality: Bilateral;   BURCH PROCEDURE N/A 11/03/2012   Procedure: BURCH PROCEDURE;  Surgeon: Rome Rigg, MD;  Location: [facility];  Service: Gynecology;  Laterality: N/A;   CARPAL TUNNEL RELEASE Right 11/20/2021   Procedure: RIGHT CARPAL TUNNEL RELEASE;  Surgeon: Jerri Kay HERO, MD;  Location: [facility];  Service: Orthopedics;  Laterality: Right;   CYSTOSCOPY N/A 11/03/2012   Procedure: CYSTOSCOPY;  Surgeon: Rome Rigg, MD;  Location: [facility];  Service: Gynecology;  Laterality: N/A;   DILATION AND CURETTAGE OF UTERUS     LAPAROSCOPY N/A 11/03/2012   Procedure: LAPAROSCOPY DIAGNOSTIC;  Surgeon: Rome Rigg, MD;  Location: [facility];  Service: Gynecology;  Laterality: N/A;   ORIF ANKLE FRACTURE Left 01/04/2016   Procedure: OPEN REDUCTION INTERNAL FIXATION (ORIF) LEFT BIMALLEOLAR ANKLE FRACTURE;  Surgeon: Lonni CINDERELLA Poli, MD;  Location: [facility];  Service:  Orthopedics;  Laterality: Left;   TUBAL LIGATION     WISDOM TOOTH EXTRACTION     3 teeth   Social History   Occupational History   Occupation: Unemployed  Tobacco Use   Smoking status: Never   Smokeless tobacco: Never  Vaping Use   Vaping status: Never Used  Substance and Sexual Activity   Alcohol use: Yes    Comment: social   Drug use: No   Sexual activity: Yes    Birth control/protection: Surgical    Comment: hysterectomy

## 2023-12-14 ENCOUNTER — Ambulatory Visit: Payer: Self-pay | Admitting: Internal Medicine

## 2023-12-18 ENCOUNTER — Encounter: Payer: Self-pay | Admitting: Orthopaedic Surgery

## 2023-12-21 ENCOUNTER — Telehealth: Payer: Self-pay | Admitting: Internal Medicine

## 2023-12-21 NOTE — Telephone Encounter (Signed)
 Lvm to confirm appt for 9/23

## 2023-12-22 ENCOUNTER — Other Ambulatory Visit: Payer: Self-pay

## 2023-12-22 ENCOUNTER — Ambulatory Visit: Payer: Self-pay | Attending: Family Medicine | Admitting: Pharmacist

## 2023-12-22 DIAGNOSIS — Z7985 Long-term (current) use of injectable non-insulin antidiabetic drugs: Secondary | ICD-10-CM

## 2023-12-22 DIAGNOSIS — Z7984 Long term (current) use of oral hypoglycemic drugs: Secondary | ICD-10-CM | POA: Diagnosis not present

## 2023-12-22 DIAGNOSIS — E1165 Type 2 diabetes mellitus with hyperglycemia: Secondary | ICD-10-CM | POA: Diagnosis not present

## 2023-12-22 DIAGNOSIS — Z794 Long term (current) use of insulin: Secondary | ICD-10-CM

## 2023-12-22 MED ORDER — SEMAGLUTIDE (2 MG/DOSE) 8 MG/3ML ~~LOC~~ SOPN
2.0000 mg | PEN_INJECTOR | SUBCUTANEOUS | 1 refills | Status: AC
  Filled 2023-12-22: qty 9, 84d supply, fill #0
  Filled 2024-01-12: qty 3, 28d supply, fill #0
  Filled 2024-02-08: qty 3, 28d supply, fill #1
  Filled 2024-03-28: qty 9, 84d supply, fill #2

## 2023-12-22 NOTE — Progress Notes (Signed)
 S:     No chief complaint on file.  53 y.o. female who presents for diabetes evaluation, education, and management. Patient arrives in good spirits and presents without any assistance.   Patient was referred and last seen by Primary Care Provider, Dr. Vicci, on 11/12/2023. I last saw her on 11/05/23 and her A1c was 7.4% at that time.  PMH is significant for HTN, T2DM, obesity. Patient reports Diabetes is longstanding. I last saw her 11/05/2023 and helped her regain access to Ozempic . Since that visit, she is doing well.   She has been taking Lantus  and glipizide  consistently. Currently taking Ozempic  1 mg weekly. Denies any NV, abdominal pain. No changes in vision. Of note, she self-decreased insulin  from 40 units daily to 25 units daily due to hypoglycemia. She has been injecting the 25 unit dose now for ~2 weeks with sugars ranging mostly in the 100s.   Family/Social History:  Fhx: heart disease, stroke, CAD, HTN Tobacco: never smoker  Alcohol: none  Current diabetes medications include: glipizide  5 mg BID, Lantus  40 units daily (self reduced to 25 units due to hypoglycemia), Ozempic  1 mg weekly  Patient confirms medication adherence. Insurance coverage: self pay  Patient reports hypoglycemic events.  Reported home fasting blood sugars: 160 - 170 mg/dL   Reported 2 hour post-meal/random blood sugars: 120s - 140s mg/dL  Patient denies nocturia (nighttime urination).  Patient reports neuropathy (nerve pain). Patient denies visual changes. Patient reports self foot exams.   Patient reported dietary habits: -Admits to some dietary indiscretion (sweets) but tries to maintain a diabetic diet  Patient-reported exercise habits:  -Very active at her job. She works as a Financial trader.   O:  No CGM in place.   Lab Results  Component Value Date   HGBA1C 7.4 (A) 11/05/2023   There were no vitals filed for this visit.  Lipid Panel     Component Value Date/Time   CHOL 158  06/29/2023 1203   TRIG 86 06/29/2023 1203   HDL 56 06/29/2023 1203   CHOLHDL 2.8 06/29/2023 1203   CHOLHDL 2.9 08/31/2015 1027   VLDL 23 08/31/2015 1027   LDLCALC 86 06/29/2023 1203   Clinical Atherosclerotic Cardiovascular Disease (ASCVD): No  The 10-year ASCVD risk score (Arnett DK, et al., 2019) is: 11.4%   Values used to calculate the score:     Age: 65 years     Clincally relevant sex: Female     Is Non-Hispanic African American: Yes     Diabetic: Yes     Tobacco smoker: No     Systolic Blood Pressure: 144 mmHg     Is BP treated: Yes     HDL Cholesterol: 56 mg/dL     Total Cholesterol: 158 mg/dL   Patient is participating in a Managed Medicaid Plan:  Yes   A/P: Diabetes longstanding with improved control. A1c in August is 7.4, down from 10.7 prior. Reported sugars on Ozempic  show continued improvement and the Ozempic  is lowering her exogenous insulin  requirement. She is asymptomatic at this time. She does not have any hypoglycemia but is able to verbalize appropriate hypoglycemia management plan. Medication adherence appears to be okay. We will continue to increase Ozempic .  -Increase Ozempic  to 2mg  weekly dose.  -Continued Lantus  40 units daily.  -Continue glipziide 5 mg BID for now.  -Patient educated on purpose, proper use, and potential adverse effects of Ozempic .  -Extensively discussed pathophysiology of diabetes, recommended lifestyle interventions, dietary effects on blood sugar  control.  -Counseled on s/sx of and management of hypoglycemia.  -Next A1c anticipated 01/2024.   Written patient instructions provided. Patient verbalized understanding of treatment plan.  Total time in face to face counseling 30 minutes.    Follow-up:  Pharmacist in November. PCP clinic visit 01/21/24  Herlene Fleeta Morris, PharmD, BCACP, CPP Clinical Pharmacist [facility] [phone]

## 2023-12-26 ENCOUNTER — Other Ambulatory Visit: Payer: Self-pay

## 2023-12-29 ENCOUNTER — Other Ambulatory Visit: Payer: Self-pay

## 2023-12-30 ENCOUNTER — Other Ambulatory Visit: Payer: Self-pay

## 2024-01-12 ENCOUNTER — Ambulatory Visit: Payer: Self-pay

## 2024-01-12 ENCOUNTER — Other Ambulatory Visit: Payer: Self-pay

## 2024-01-12 DIAGNOSIS — H9311 Tinnitus, right ear: Secondary | ICD-10-CM

## 2024-01-12 DIAGNOSIS — H538 Other visual disturbances: Secondary | ICD-10-CM

## 2024-01-12 NOTE — Telephone Encounter (Signed)
 FYI Only or Action Required?: Action required by provider: referral request. Requesting referral to Optometrist and ENT   Patient was last seen in primary care on 11/12/2023 by Julia Barnie NOVAK, MD.  Called Nurse Triage reporting Blurred Vision and Tinnitus.  Symptoms began several weeks ago.  Interventions attempted: Rest, hydration, or home remedies.  Symptoms are: gradually worsening.  Triage Disposition: See PCP Within 2 Weeks, See Physician Within 24 Hours  Patient/caregiver understands and will follow disposition?: Yes  Copied from CRM [id number]. Topic: Clinical - Red Word Triage >> Jan 12, 2024  4:22 PM Julia Dunn wrote: Red Word that prompted transfer to Nurse Triage: Pt is experiencing a blurry vision and right ear is ringing. Pt needs an ent and  optometrist referral Reason for Disposition  Taking medication that can damage hearing (i.e., gentamycin, tobramycin , furosemide , ethacrynic acid, cisplatin, quinidine)  [1] Blurred vision or visual changes AND [2] gradual onset (e.g., weeks, months)  Answer Assessment - Initial Assessment Questions 1. DESCRIPTION: How has your vision changed? (e.g., complete vision loss, blurred vision, double vision, floaters, etc.)     Bluured vision, has to squint and blink a lot  2. LOCATION: One or both eyes? If one, ask: Which eye?     Both eyes  3. SEVERITY: Can you see anything? If Yes, ask: What can you see? (e.g., fine print)     Can see but has to adjust eyes constantly   4. ONSET: When did this begin? Did it start suddenly or has this been gradual?     Has been a while. Went last August to eye doctor to get an exam but she feels like  the prescription is not current  5. PATTERN: Does this come and go, or has it been constant since it started?     Happening more often than before  6. PAIN: Is there any pain in your eye(s)?  (Scale 1-10; or mild, moderate, severe)     *No Answer* 7. CONTACTS-GLASSES: Do you  wear contacts or glasses?     Glasses  8. CAUSE: What do you think is causing this visual problem?     Patient thinks eye glass prescirption is incorrect  9. OTHER SYMPTOMS: Do you have any other symptoms? (e.g., confusion, headache, arm or leg weakness, speech problems)     Ears ringing  10. PREGNANCY: Is there any chance you are pregnant? When was your last menstrual period?       no  Answer Assessment - Initial Assessment Questions 1. DESCRIPTION: Describe the sound you are hearing. (e.g., buzzing, hissing, humming, ringing)     Constant ringing in right ear  2. LOCATION: Is the sound in one or both ears? If one, ask: Which ear?     Right ear  3. SEVERITY: How bad is it?      Getting worse  4. ONSET: When did this begin? Did it start suddenly or come on gradually?     Getting worse since June, started about a year ago  5. PATTERN: Does this come and go, or has it been constant since it started?     Constant  6. HEARING LOSS: Is your hearing decreased? (e.g., normal, decreased)       Yes, decreased  7. OTHER SYMPTOMS: Do you have any other symptoms? (e.g., dizziness, earache)     Blurry vision  8. PREGNANCY: Is there any chance you are pregnant? When was your last menstrual period?     *No Answer*  Protocols  used: Vision Loss or Change-A-AH, Tinnitus-A-AH

## 2024-01-13 ENCOUNTER — Telehealth: Payer: Self-pay | Admitting: Internal Medicine

## 2024-01-13 ENCOUNTER — Encounter: Payer: Self-pay | Admitting: Family Medicine

## 2024-01-13 ENCOUNTER — Ambulatory Visit (INDEPENDENT_AMBULATORY_CARE_PROVIDER_SITE_OTHER): Admitting: Family Medicine

## 2024-01-13 VITALS — BP 128/84 | HR 64 | Ht 64.0 in | Wt 236.0 lb

## 2024-01-13 DIAGNOSIS — R438 Other disturbances of smell and taste: Secondary | ICD-10-CM | POA: Diagnosis not present

## 2024-01-13 DIAGNOSIS — R0981 Nasal congestion: Secondary | ICD-10-CM | POA: Diagnosis not present

## 2024-01-13 DIAGNOSIS — H9313 Tinnitus, bilateral: Secondary | ICD-10-CM | POA: Diagnosis not present

## 2024-01-13 DIAGNOSIS — H538 Other visual disturbances: Secondary | ICD-10-CM

## 2024-01-13 DIAGNOSIS — J342 Deviated nasal septum: Secondary | ICD-10-CM

## 2024-01-13 NOTE — Telephone Encounter (Signed)
 Copied from CRM #[id number]. Topic: General - Running Late >> Jan 13, 2024  1:56 PM Tiffini S wrote: Patient/patient representative is calling because they are running late for an appointment. Patient is in route to the appointment- was at work and lost track of tim e/ advise patient about the 10 minute grace period.

## 2024-01-13 NOTE — Telephone Encounter (Signed)
 Referrals requested have been submitted.

## 2024-01-13 NOTE — Progress Notes (Unsigned)
 Established Patient Office Visit  Subjective    Patient ID: Julia Dunn, female    DOB: Aug 15, 1970  Age: 53 y.o. MRN: [id number]  CC:  Chief Complaint  Patient presents with   Blurred Vision    Pt reports ears ringing and blurry vision    HPI Miyu Fenderson presents with complaints of nasal congestion with deviated septum and decreased smell. Patient also reports bilateral ringing in ears. She desires a referral  Outpatient Encounter Medications as of 01/13/2024  Medication Sig   albuterol  (VENTOLIN  HFA) 108 (90 Base) MCG/ACT inhaler Inhale 2 puffs into the lungs every 6 (six) hours as needed for wheezing or shortness of breath.   amLODipine  (NORVASC ) 10 MG tablet Take 1 tablet (10 mg total) by mouth daily.   atorvastatin  (LIPITOR) 20 MG tablet Take 1 tablet (20 mg total) by mouth daily after supper.   carvedilol  (COREG ) 3.125 MG tablet Take 1 tablet (3.125 mg total) by mouth 2 (two) times daily with a meal.   cyclobenzaprine  (FLEXERIL ) 5 MG tablet Take 1 tablet (5 mg total) by mouth 2 (two) times daily as needed for muscle spasms.   diclofenac  Sodium (VOLTAREN ) 1 % GEL Apply 4 g topically 4 (four) times daily as needed.   FLUoxetine  (PROZAC ) 20 MG capsule Take 1 capsule (20 mg total) by mouth daily.   furosemide  (LASIX ) 20 MG tablet Take 1 tablet (20 mg total) by mouth daily.   glipiZIDE  (GLUCOTROL ) 5 MG tablet Take 1 tablet (5 mg total) by mouth 2 (two) times daily before a meal.   insulin  glargine (LANTUS ) 100 UNIT/ML Solostar Pen Inject 40 Units into the skin at bedtime.   Insulin  Pen Needle (TRUEPLUS PEN NEEDLES) 32G X 4 MM MISC Use to inject insulin  daily.   lisinopril -hydrochlorothiazide  (ZESTORETIC ) 20-12.5 MG tablet Take 2 tablets by mouth daily.   omeprazole  (PRILOSEC) 20 MG capsule TAKE 1 CAPSULE (20 MG TOTAL) BY MOUTH DAILY.   ONE TOUCH ULTRA TEST test strip USE  STRIP TO CHECK GLUCOSE THREE TIMES DAILY BEFORE MEAL(S)   Semaglutide , 2 MG/DOSE, 8 MG/3ML SOPN Inject 2 mg as  directed once a week.   valACYclovir  (VALTREX ) 500 MG tablet Take 2 tablets (1,000 mg total) by mouth daily.   Vitamin D , Ergocalciferol , (DRISDOL ) 1.25 MG (50000 UNIT) CAPS capsule Take 1 capsule (50,000 Units total) by mouth every 7 (seven) days.   No facility-administered encounter medications on file as of 01/13/2024.    Past Medical History:  Diagnosis Date   Anemia Dx 2013   Asthma    Bacterial vaginosis 2008   Complication of anesthesia    pt states was informed had difficult time awakening following hysterectomy    Depression    Depression    Diabetes mellitus Dx 2005   Fibroids 09/05/2011   08/08/2011: Anteverted uterus. Diffuse fibroid involvement. Left of midline fibroid with submucosal component. 3.4cm x 2.8cmx 3.4cm.    GERD (gastroesophageal reflux disease)    Gestational diabetes    H/O shortness of breath    pt states has improved since lossing weight; triggers currently are enviromental pt states can make it to top of stairs w/o having to stop to catch her breath   Headache(784.0)    migraines   History of blood transfusion 2013/2014   History of bronchitis    Hx of blood clots    Hypertension Dx 2015   Kidney infection 2015   Menorrhagia 09/27/2010   Pneumonia    Postpartum depression  Pregnancy induced hypertension    Sleep apnea    mild, no CPAP recommended   Trichomonas     Past Surgical History:  Procedure Laterality Date   ABDOMINAL HYSTERECTOMY N/A 11/03/2012   Procedure: HYSTERECTOMY ABDOMINAL;  Surgeon: Rome Rigg, MD;  Location: [facility];  Service: Gynecology;  Laterality: N/A;   BILATERAL SALPINGECTOMY Bilateral 11/03/2012   Procedure: BILATERAL SALPINGECTOMY;  Surgeon: Rome Rigg, MD;  Location: [facility];  Service: Gynecology;  Laterality: Bilateral;   BURCH PROCEDURE N/A 11/03/2012   Procedure: BURCH PROCEDURE;  Surgeon: Rome Rigg, MD;  Location: [facility];  Service: Gynecology;  Laterality: N/A;   CARPAL TUNNEL RELEASE Right 11/20/2021    Procedure: RIGHT CARPAL TUNNEL RELEASE;  Surgeon: Jerri Kay HERO, MD;  Location: [facility];  Service: Orthopedics;  Laterality: Right;   CYSTOSCOPY N/A 11/03/2012   Procedure: CYSTOSCOPY;  Surgeon: Rome Rigg, MD;  Location: [facility];  Service: Gynecology;  Laterality: N/A;   DILATION AND CURETTAGE OF UTERUS     LAPAROSCOPY N/A 11/03/2012   Procedure: LAPAROSCOPY DIAGNOSTIC;  Surgeon: Rome Rigg, MD;  Location: [facility];  Service: Gynecology;  Laterality: N/A;   ORIF ANKLE FRACTURE Left 01/04/2016   Procedure: OPEN REDUCTION INTERNAL FIXATION (ORIF) LEFT BIMALLEOLAR ANKLE FRACTURE;  Surgeon: Lonni CINDERELLA Poli, MD;  Location: [facility];  Service: Orthopedics;  Laterality: Left;   TUBAL LIGATION     WISDOM TOOTH EXTRACTION     3 teeth    Family History  Problem Relation Age of Onset   Heart disease Mother    Rheumatic fever Mother    Hypertension Mother    Stroke Mother    CAD Mother    Hypertension Father    Hypertension Brother    Stroke Brother    Sickle cell trait Daughter    Heart disease Paternal Aunt    Breast cancer Paternal Aunt 65 - 2   Breast cancer Paternal Grandmother 29 - 20    Social History   Socioeconomic History   Marital status: Significant Other    Spouse name: Not on file   Number of children: 3   Years of education: Not on file   Highest education level: Not on file  Occupational History   Occupation: Unemployed  Tobacco Use   Smoking status: Never   Smokeless tobacco: Never  Vaping Use   Vaping status: Never Used  Substance and Sexual Activity   Alcohol use: Yes    Comment: social   Drug use: No   Sexual activity: Yes    Birth control/protection: Surgical    Comment: hysterectomy  Other Topics Concern   Not on file  Social History Narrative   Not on file   Social Drivers of Health   Financial Resource Strain: Not on file  Food Insecurity: No Food Insecurity (07/28/2023)   Hunger Vital Sign    Worried About Running  Out of Food in the Last Year: Never true    Ran Out of Food in the Last Year: Never true  Transportation Needs: No Transportation Needs (07/28/2023)   PRAPARE - Administrator, Civil Service (Medical): No    Lack of Transportation (Non-Medical): No  Physical Activity: Not on file  Stress: Not on file  Social Connections: Unknown (06/16/2022)   Received from [facility]   Social Network    Social Network: Not on file  Intimate Partner Violence: Not At Risk (06/29/2023)   Humiliation, Afraid, Rape, and Kick questionnaire    Fear  of Current or Ex-Partner: No    Emotionally Abused: No    Physically Abused: No    Sexually Abused: No    Review of Systems  All other systems reviewed and are negative.       Objective    BP 128/84   Pulse 64   Ht 5' 4 (1.626 m)   Wt 236 lb (107 kg)   LMP 06/29/2012   SpO2 96%   BMI 40.51 kg/m   Physical Exam Vitals and nursing note reviewed.  Constitutional:      General: She is not in acute distress. HENT:     Head: Normocephalic and atraumatic.     Right Ear: Tympanic membrane normal.     Left Ear: Tympanic membrane normal.     Nose: Congestion present.     Mouth/Throat:     Mouth: Mucous membranes are moist.     Pharynx: Oropharynx is clear.  Eyes:     Conjunctiva/sclera: Conjunctivae normal.  Cardiovascular:     Rate and Rhythm: Normal rate and regular rhythm.  Pulmonary:     Effort: Pulmonary effort is normal.     Breath sounds: Normal breath sounds.  Abdominal:     Palpations: Abdomen is soft.     Tenderness: There is no abdominal tenderness.  Neurological:     General: No focal deficit present.     Mental Status: She is alert and oriented to person, place, and time.         Assessment & Plan:   Tinnitus of both ears -     Ambulatory referral to ENT  Nasal congestion -     Ambulatory referral to ENT  Deviated septum -     Ambulatory referral to ENT  Decreased sense of smell -     Ambulatory  referral to ENT  Blurred vision  Patient to follow up with optometrist regarding vision   No follow-ups on file.   Tanda Raguel SQUIBB, MD

## 2024-01-14 ENCOUNTER — Encounter (INDEPENDENT_AMBULATORY_CARE_PROVIDER_SITE_OTHER): Payer: Self-pay

## 2024-01-14 ENCOUNTER — Other Ambulatory Visit: Payer: Self-pay

## 2024-01-14 NOTE — Telephone Encounter (Signed)
 Patient is made aware the referral has been sent.

## 2024-01-15 ENCOUNTER — Other Ambulatory Visit: Payer: Self-pay

## 2024-01-18 ENCOUNTER — Other Ambulatory Visit: Payer: Self-pay

## 2024-01-21 ENCOUNTER — Other Ambulatory Visit: Payer: Self-pay

## 2024-01-21 ENCOUNTER — Ambulatory Visit: Payer: Self-pay | Admitting: Internal Medicine

## 2024-01-26 ENCOUNTER — Ambulatory Visit: Payer: Self-pay | Admitting: Internal Medicine

## 2024-01-26 ENCOUNTER — Ambulatory Visit: Admitting: Orthopaedic Surgery

## 2024-01-26 DIAGNOSIS — E1169 Type 2 diabetes mellitus with other specified complication: Secondary | ICD-10-CM | POA: Diagnosis not present

## 2024-01-26 DIAGNOSIS — M25562 Pain in left knee: Secondary | ICD-10-CM

## 2024-01-26 DIAGNOSIS — G5602 Carpal tunnel syndrome, left upper limb: Secondary | ICD-10-CM

## 2024-01-26 DIAGNOSIS — G8929 Other chronic pain: Secondary | ICD-10-CM | POA: Insufficient documentation

## 2024-01-26 LAB — POCT GLYCOSYLATED HEMOGLOBIN (HGB A1C): Hemoglobin A1C: 6.6 % — AB (ref 4.0–5.6)

## 2024-01-26 NOTE — Progress Notes (Signed)
 Office Visit Note   Patient: Julia Dunn           Date of Birth: 1970/10/24           MRN: [id number] Visit Date: 01/26/2024              Requested by: [name], MD [address] [place] [address] [place],  [place] [address] PCP: [name], MD   Assessment & Plan: Visit Diagnoses:  1. Left carpal tunnel syndrome   2. Chronic pain of left knee   3. Type 2 diabetes mellitus with other specified complication, without long-term current use of insulin  (HCC)     Plan: History of Present Illness Julia Dunn is a 53 year old female with carpal tunnel syndrome who presents with worsening numbness in her left hand.  The numbness in her left hand has worsened, initially affecting only a couple of fingers but now extending up to her shoulder, causing a sensation of being 'locked'.  She experiences ongoing pain in her left knee. An MRI was previously discussed but not approved. The knee pain persists, although it is not as severe as before.  Examination of the left hand and left knee are unchanged from prior visit.  Assessment and Plan Left hand carpal tunnel syndrome Chronic condition with symptom progression, numbness extending to shoulder. - Schedule surgery for left hand carpal tunnel syndrome. - Perform finger stick to check A1c level for surgical clearance.  Left knee pain Persistent pain with previous MRI request denied. Pain less severe. - Reorder MRI for left knee after documenting physical therapy compliance.  Follow-Up Instructions: No follow-ups on file.   Orders:  Orders Placed This Encounter  Procedures   MR Knee Left w/o contrast   POCT HgB A1C   No orders of the defined types were placed in this encounter.     Procedures: No procedures performed   Clinical Data: No additional findings.   Subjective: Chief Complaint  Patient presents with   Left Wrist - Follow-up    HPI  Review of Systems  Constitutional: Negative.   HENT: Negative.     Eyes: Negative.   Respiratory: Negative.    Cardiovascular: Negative.   Endocrine: Negative.   Musculoskeletal: Negative.   Neurological: Negative.   Hematological: Negative.   Psychiatric/Behavioral: Negative.    All other systems reviewed and are negative.    Objective: Vital Signs: LMP 06/29/2012   Physical Exam Vitals and nursing note reviewed.  Constitutional:      Appearance: She is well-developed.  HENT:     Head: Atraumatic.     Nose: Nose normal.  Eyes:     Extraocular Movements: Extraocular movements intact.  Cardiovascular:     Pulses: Normal pulses.  Pulmonary:     Effort: Pulmonary effort is normal.  Abdominal:     Palpations: Abdomen is soft.  Musculoskeletal:     Cervical back: Neck supple.  Skin:    General: Skin is warm.     Capillary Refill: Capillary refill takes less than 2 seconds.  Neurological:     Mental Status: She is alert. Mental status is at baseline.  Psychiatric:        Behavior: Behavior normal.        Thought Content: Thought content normal.        Judgment: Judgment normal.     Ortho Exam  Specialty Comments:  No specialty comments available.  Imaging: No results found.   PMFS History: Patient Active Problem  List   Diagnosis Date Noted   Chronic pain of left knee 01/26/2024   Depression, major, recurrent, moderate (HCC) 06/13/2022   Bilateral hand pain 08/09/2021   Right carpal tunnel syndrome 08/09/2021   Left carpal tunnel syndrome 08/09/2021   Acute on chronic diastolic congestive heart failure (HCC) 08/03/2021   Closed displaced bimalleolar fracture of left ankle 01/04/2016   Abnormal EKG 06/22/2014   Abnormal CT scan of lung 06/22/2014   Dyspnea 06/19/2014   Microscopic hematuria 04/07/2014   Onychomycosis 05/31/2013   BI-RADS category 3 mammogram result 03/07/2013   Vitamin D  insufficiency 08/13/2011   Morbid obesity (HCC) 08/10/2007   ALLERGIC RHINITIS, SEASONAL 07/14/2006   DM2 (diabetes mellitus, type  2) 05/28/2006   DEPRESSIVE DISORDER, NOS 05/28/2006   HTN (hypertension) 05/28/2006   Past Medical History:  Diagnosis Date   Anemia Dx 2013   Asthma    Bacterial vaginosis 2008   Complication of anesthesia    pt states was informed had difficult time awakening following hysterectomy    Depression    Depression    Diabetes mellitus Dx 2005   Fibroids 09/05/2011   08/08/2011: Anteverted uterus. Diffuse fibroid involvement. Left of midline fibroid with submucosal component. 3.4cm x 2.8cmx 3.4cm.    GERD (gastroesophageal reflux disease)    Gestational diabetes    H/O shortness of breath    pt states has improved since lossing weight; triggers currently are enviromental pt states can make it to top of stairs w/o having to stop to catch her breath   Headache(784.0)    migraines   History of blood transfusion 2013/2014   History of bronchitis    Hx of blood clots    Hypertension Dx 2015   Kidney infection 2015   Menorrhagia 09/27/2010   Pneumonia    Postpartum depression    Pregnancy induced hypertension    Sleep apnea    mild, no CPAP recommended   Trichomonas     Family History  Problem Relation Age of Onset   Heart disease Mother    Rheumatic fever Mother    Hypertension Mother    Stroke Mother    CAD Mother    Hypertension Father    Hypertension Brother    Stroke Brother    Sickle cell trait Daughter    Heart disease Paternal Aunt    Breast cancer Paternal Aunt 30 - 81   Breast cancer Paternal Grandmother 98 - 28    Past Surgical History:  Procedure Laterality Date   ABDOMINAL HYSTERECTOMY N/A 11/03/2012   Procedure: HYSTERECTOMY ABDOMINAL;  Surgeon: Rome Rigg, MD;  Location: [facility];  Service: Gynecology;  Laterality: N/A;   BILATERAL SALPINGECTOMY Bilateral 11/03/2012   Procedure: BILATERAL SALPINGECTOMY;  Surgeon: Rome Rigg, MD;  Location: [facility];  Service: Gynecology;  Laterality: Bilateral;   BURCH PROCEDURE N/A 11/03/2012   Procedure: BURCH PROCEDURE;   Surgeon: Rome Rigg, MD;  Location: [facility];  Service: Gynecology;  Laterality: N/A;   CARPAL TUNNEL RELEASE Right 11/20/2021   Procedure: RIGHT CARPAL TUNNEL RELEASE;  Surgeon: Jerri Kay HERO, MD;  Location: [facility];  Service: Orthopedics;  Laterality: Right;   CYSTOSCOPY N/A 11/03/2012   Procedure: CYSTOSCOPY;  Surgeon: Rome Rigg, MD;  Location: [facility];  Service: Gynecology;  Laterality: N/A;   DILATION AND CURETTAGE OF UTERUS     LAPAROSCOPY N/A 11/03/2012   Procedure: LAPAROSCOPY DIAGNOSTIC;  Surgeon: Rome Rigg, MD;  Location: [facility];  Service: Gynecology;  Laterality: N/A;  ORIF ANKLE FRACTURE Left 01/04/2016   Procedure: OPEN REDUCTION INTERNAL FIXATION (ORIF) LEFT BIMALLEOLAR ANKLE FRACTURE;  Surgeon: Lonni CINDERELLA Poli, MD;  Location: [facility];  Service: Orthopedics;  Laterality: Left;   TUBAL LIGATION     WISDOM TOOTH EXTRACTION     3 teeth   Social History   Occupational History   Occupation: Unemployed  Tobacco Use   Smoking status: Never   Smokeless tobacco: Never  Vaping Use   Vaping status: Never Used  Substance and Sexual Activity   Alcohol use: Yes    Comment: social   Drug use: No   Sexual activity: Yes    Birth control/protection: Surgical    Comment: hysterectomy

## 2024-02-01 ENCOUNTER — Other Ambulatory Visit: Payer: Self-pay

## 2024-02-01 ENCOUNTER — Encounter: Payer: Self-pay | Admitting: Radiology

## 2024-02-05 ENCOUNTER — Encounter: Payer: Self-pay | Admitting: Orthopaedic Surgery

## 2024-02-08 ENCOUNTER — Other Ambulatory Visit: Payer: Self-pay | Admitting: Physician Assistant

## 2024-02-08 ENCOUNTER — Other Ambulatory Visit: Payer: Self-pay

## 2024-02-08 ENCOUNTER — Other Ambulatory Visit: Payer: Self-pay | Admitting: Internal Medicine

## 2024-02-08 DIAGNOSIS — E559 Vitamin D deficiency, unspecified: Secondary | ICD-10-CM

## 2024-02-08 DIAGNOSIS — F330 Major depressive disorder, recurrent, mild: Secondary | ICD-10-CM

## 2024-02-09 ENCOUNTER — Other Ambulatory Visit: Payer: Self-pay

## 2024-02-09 ENCOUNTER — Ambulatory Visit (INDEPENDENT_AMBULATORY_CARE_PROVIDER_SITE_OTHER): Payer: Self-pay | Admitting: Mental Health

## 2024-02-09 DIAGNOSIS — F331 Major depressive disorder, recurrent, moderate: Secondary | ICD-10-CM

## 2024-02-09 MED ORDER — GLIPIZIDE 5 MG PO TABS
5.0000 mg | ORAL_TABLET | Freq: Two times a day (BID) | ORAL | 1 refills | Status: DC
Start: 2024-02-09 — End: 2024-02-18
  Filled 2024-03-01: qty 90, 45d supply, fill #0
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
## 2024-02-10 ENCOUNTER — Other Ambulatory Visit: Payer: Self-pay

## 2024-02-10 DIAGNOSIS — Z419 Encounter for procedure for purposes other than remedying health state, unspecified: Secondary | ICD-10-CM | POA: Diagnosis not present

## 2024-02-10 MED ORDER — VITAMIN D (ERGOCALCIFEROL) 1.25 MG (50000 UNIT) PO CAPS
50000.0000 [IU] | ORAL_CAPSULE | ORAL | 0 refills | Status: AC
  Filled 2024-03-01: qty 4, 28d supply, fill #0

## 2024-02-10 MED ORDER — FLUOXETINE HCL 20 MG PO CAPS
20.0000 mg | ORAL_CAPSULE | Freq: Every day | ORAL | 3 refills | Status: AC
  Filled 2024-03-01: qty 30, 30d supply, fill #0

## 2024-02-10 NOTE — Telephone Encounter (Signed)
 Let patient know that I have received refill request for Prozac  and high-dose vitamin D .  I request that she comes to the lab to have vitamin D  level rechecked.  Last checked in March of this year.  Rfs sent. Will be due for f/u in January. Should schedule now while slots still open for that month.

## 2024-02-10 NOTE — Telephone Encounter (Signed)
 Called & spoke to the patient. Verified name & DOB. Informed patient that Dr.Johnson recevied a refill request for Prozac  and high-dose vitamin D .  Dr.Johnson requests that she comes to the lab to have vitamin D  level rechecked.  Last checked in March of this year.  Rfs sent. Lab appointment scheduled for 02/11/2024. Follow-up appointment scheduled for 04/16/2023. Patient expressed verbal understanding of all discussed.

## 2024-02-11 ENCOUNTER — Ambulatory Visit

## 2024-02-12 ENCOUNTER — Other Ambulatory Visit: Payer: Self-pay

## 2024-02-12 ENCOUNTER — Other Ambulatory Visit

## 2024-02-15 ENCOUNTER — Other Ambulatory Visit: Payer: Self-pay

## 2024-02-16 ENCOUNTER — Ambulatory Visit (INDEPENDENT_AMBULATORY_CARE_PROVIDER_SITE_OTHER): Admitting: Otolaryngology

## 2024-02-16 ENCOUNTER — Other Ambulatory Visit: Payer: Self-pay

## 2024-02-16 ENCOUNTER — Encounter (INDEPENDENT_AMBULATORY_CARE_PROVIDER_SITE_OTHER): Payer: Self-pay | Admitting: Otolaryngology

## 2024-02-16 VITALS — BP 153/83 | HR 69 | Temp 98.0°F | Ht 64.0 in | Wt 235.0 lb

## 2024-02-16 DIAGNOSIS — R430 Anosmia: Secondary | ICD-10-CM

## 2024-02-16 DIAGNOSIS — J329 Chronic sinusitis, unspecified: Secondary | ICD-10-CM

## 2024-02-16 DIAGNOSIS — H9311 Tinnitus, right ear: Secondary | ICD-10-CM | POA: Diagnosis not present

## 2024-02-16 DIAGNOSIS — H903 Sensorineural hearing loss, bilateral: Secondary | ICD-10-CM

## 2024-02-16 MED ORDER — AMOXICILLIN-POT CLAVULANATE 875-125 MG PO TABS
1.0000 | ORAL_TABLET | Freq: Two times a day (BID) | ORAL | 0 refills | Status: AC
  Filled 2024-02-16: qty 20, 10d supply, fill #0

## 2024-02-16 MED ORDER — MOMETASONE FUROATE 50 MCG/ACT NA SUSP
2.0000 | Freq: Every day | NASAL | 12 refills | Status: AC
  Filled 2024-02-16: qty 17, 25d supply, fill #0

## 2024-02-16 NOTE — Progress Notes (Signed)
 Reason for Consult: Tinnitus and sinusitis Referring Physician: Dr. Tanda Domino Julia Dunn is an 53 y.o. female.  HPI: History of 6 months of right sided tinnitus.  It did not come up suddenly.  She also has hearing loss that is also slow onset in the right ear.  She apparently she had a audiogram at [facility] and she does not have the results of that but said the hearing was worse in the right than the left.  She has not had any vertigo during the onset of this but now is having positional vertigo episodes over the last month.  No ear pain or drainage.  She has not had this previously.  She does have a lot of nose congestion and postnasal drip symptoms.  Over the last couple years she has noticed that her sense of smell has decreased.  She does have a history of sinus problems.  She does have a prescription for nasal steroid spray but does not use it.  She thinks she has had a history of allergies.  She has some facial pressure occasionally.  Past Medical History:  Diagnosis Date   Anemia Dx 2013   Asthma    Bacterial vaginosis 2008   Complication of anesthesia    pt states was informed had difficult time awakening following hysterectomy    Depression    Depression    Diabetes mellitus Dx 2005   Fibroids 09/05/2011   08/08/2011: Anteverted uterus. Diffuse fibroid involvement. Left of midline fibroid with submucosal component. 3.4cm x 2.8cmx 3.4cm.    GERD (gastroesophageal reflux disease)    Gestational diabetes    H/O shortness of breath    pt states has improved since lossing weight; triggers currently are enviromental pt states can make it to top of stairs w/o having to stop to catch her breath   Headache(784.0)    migraines   History of blood transfusion 2013/2014   History of bronchitis    Hx of blood clots    Hypertension Dx 2015   Kidney infection 2015   Menorrhagia 09/27/2010   Pneumonia    Postpartum depression    Pregnancy induced hypertension    Sleep apnea    mild, no CPAP  recommended   Trichomonas     Past Surgical History:  Procedure Laterality Date   ABDOMINAL HYSTERECTOMY N/A 11/03/2012   Procedure: HYSTERECTOMY ABDOMINAL;  Surgeon: Rome Rigg, MD;  Location: [facility];  Service: Gynecology;  Laterality: N/A;   BILATERAL SALPINGECTOMY Bilateral 11/03/2012   Procedure: BILATERAL SALPINGECTOMY;  Surgeon: Rome Rigg, MD;  Location: [facility];  Service: Gynecology;  Laterality: Bilateral;   BURCH PROCEDURE N/A 11/03/2012   Procedure: BURCH PROCEDURE;  Surgeon: Rome Rigg, MD;  Location: [facility];  Service: Gynecology;  Laterality: N/A;   CARPAL TUNNEL RELEASE Right 11/20/2021   Procedure: RIGHT CARPAL TUNNEL RELEASE;  Surgeon: Jerri Kay HERO, MD;  Location: [facility];  Service: Orthopedics;  Laterality: Right;   CYSTOSCOPY N/A 11/03/2012   Procedure: CYSTOSCOPY;  Surgeon: Rome Rigg, MD;  Location: [facility];  Service: Gynecology;  Laterality: N/A;   DILATION AND CURETTAGE OF UTERUS     LAPAROSCOPY N/A 11/03/2012   Procedure: LAPAROSCOPY DIAGNOSTIC;  Surgeon: Rome Rigg, MD;  Location: [facility];  Service: Gynecology;  Laterality: N/A;   ORIF ANKLE FRACTURE Left 01/04/2016   Procedure: OPEN REDUCTION INTERNAL FIXATION (ORIF) LEFT BIMALLEOLAR ANKLE FRACTURE;  Surgeon: Lonni CINDERELLA Poli, MD;  Location: [facility];  Service: Orthopedics;  Laterality: Left;  TUBAL LIGATION     WISDOM TOOTH EXTRACTION     3 teeth    Family History  Problem Relation Age of Onset   Heart disease Mother    Rheumatic fever Mother    Hypertension Mother    Stroke Mother    CAD Mother    Hypertension Father    Hypertension Brother    Stroke Brother    Sickle cell trait Daughter    Heart disease Paternal Aunt    Breast cancer Paternal Aunt 64 - 68   Breast cancer Paternal Grandmother 13 - 74    Social History:  reports that she has never smoked. She has never used smokeless tobacco. She reports current alcohol use. She reports that she does not use  drugs.  Allergies:  Allergies  Allergen Reactions   Metformin  And Related Other (See Comments)    GI upset even with low dose.    Medications: I have reviewed the patient's current medications.  No results found for this or any previous visit (from the past 48 hours).  No results found.  ROS Blood pressure (!) 153/83, pulse 69, temperature 98 F (36.7 C), height 5' 4 (1.626 m), weight 235 lb (106.6 kg), last menstrual period 06/29/2012, SpO2 97%. Physical Exam Constitutional:      Appearance: Normal appearance.  HENT:     Head: Normocephalic and atraumatic.     Right Ear: Tympanic membrane is without lesions and middle ear aerated, ear canal and external ear normal.     Left Ear: Tympanic membrane is without lesions and middle ear aerated, ear canal and external ear normal.     Nose: Nose the septum is deviated to the left. Turbinates with moderate hypertrophy, No significant swelling or masses.     Oral cavity/oropharynx: Mucous membranes are moist. No lesions or masses    Larynx: normal voice. Mirror attempted without success    Eyes:     Extraocular Movements: Extraocular movements intact.     Conjunctiva/sclera: Conjunctivae normal.     Pupils: Pupils are equal, round, and reactive to light.  Cardiovascular:     Rate and Rhythm: Normal rate.  Pulmonary:     Effort: Pulmonary effort is normal.  Musculoskeletal:     Cervical back: Normal range of motion and neck supple. No rigidity.  Lymphadenopathy:     Cervical: No cervical adenopathy or masses.salivary glands without lesions. .  Neurological:     Mental Status: He is alert. CN 2-12 intact. 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  I need her audiogram and she will bring that in so we do not have to repeat it.  She does not think that she can proceed with an MRI scan right now because of insurance coverage.  We  talked about the manifestations of acoustic neuroma  Chronic sinusitis-she has anosmia and initial workup will be for treating sinusitis.  I called in Augmentin  and Nasonex.  She will follow-up in 3 weeks and we will further discuss this situation.  The MRI scan would cover the ear and the sinuses if we can get that taken care of.  Norleen Notice 02/16/2024, 8:24 AM

## 2024-02-17 ENCOUNTER — Telehealth: Payer: Self-pay | Admitting: Internal Medicine

## 2024-02-17 NOTE — Telephone Encounter (Signed)
 Confirmed appt for 11/20.

## 2024-02-18 ENCOUNTER — Ambulatory Visit: Attending: Internal Medicine | Admitting: Pharmacist

## 2024-02-18 ENCOUNTER — Other Ambulatory Visit: Payer: Self-pay

## 2024-02-18 ENCOUNTER — Encounter: Payer: Self-pay | Admitting: Pharmacist

## 2024-02-18 DIAGNOSIS — E1169 Type 2 diabetes mellitus with other specified complication: Secondary | ICD-10-CM | POA: Diagnosis not present

## 2024-02-18 DIAGNOSIS — Z7984 Long term (current) use of oral hypoglycemic drugs: Secondary | ICD-10-CM | POA: Diagnosis not present

## 2024-02-18 DIAGNOSIS — Z794 Long term (current) use of insulin: Secondary | ICD-10-CM

## 2024-02-18 DIAGNOSIS — Z7985 Long-term (current) use of injectable non-insulin antidiabetic drugs: Secondary | ICD-10-CM

## 2024-02-18 LAB — POCT GLYCOSYLATED HEMOGLOBIN (HGB A1C): HbA1c, POC (controlled diabetic range): 6.5 % (ref 0.0–7.0)

## 2024-02-18 MED ORDER — INSULIN GLARGINE 100 UNIT/ML SOLOSTAR PEN
15.0000 [IU] | PEN_INJECTOR | Freq: Every day | SUBCUTANEOUS | 1 refills | Status: AC
  Filled 2024-02-18: qty 12, 80d supply, fill #0

## 2024-02-18 NOTE — Progress Notes (Signed)
    S:     No chief complaint on file.  53 y.o. female who presents for diabetes evaluation, education, and management. Patient arrives in good spirits and presents without any assistance.   Patient was referred and last seen by Primary Care Provider, Dr. Vicci, on 11/12/2023. I last saw her on 12/22/23. At that visit with me, her Ozempic  dose was increased to 2 mg weekly.   PMH is significant for HTN, T2DM, obesity.   She has been taking Lantus  and glipizide  consistently. Currently taking Ozempic  2 mg weekly. Denies any NV, abdominal pain. No changes in vision. Of note, she self-decreased insulin  from 40 units daily to 15 units daily due to hypoglycemia. Additionally, she will often go several days in a row without insulin  due to hypoglycemia.  Family/Social History:  Fhx: heart disease, stroke, CAD, HTN Tobacco: never smoker  Alcohol: none  Current diabetes medications include: glipizide  5 mg BID, Lantus  40 units daily (self reduced to 15 units due to hypoglycemia), Ozempic  2 mg weekly  Patient confirms medication adherence. Insurance coverage: self pay  Patient reports hypoglycemic events.  Patient denies nocturia (nighttime urination).  Patient reports neuropathy (nerve pain). Patient denies visual changes. Patient reports self foot exams.   Patient reported dietary habits: -Admits to some dietary indiscretion (sweets) but tries to maintain a diabetic diet  Patient-reported exercise habits:  -Very active at her job. She works as a financial trader.   O:  No CGM in place.   Lab Results  Component Value Date   HGBA1C 6.5 02/18/2024   There were no vitals filed for this visit.  Lipid Panel     Component Value Date/Time   CHOL 158 06/29/2023 1203   TRIG 86 06/29/2023 1203   HDL 56 06/29/2023 1203   CHOLHDL 2.8 06/29/2023 1203   CHOLHDL 2.9 08/31/2015 1027   VLDL 23 08/31/2015 1027   LDLCALC 86 06/29/2023 1203   Clinical Atherosclerotic Cardiovascular Disease  (ASCVD): No  The 10-year ASCVD risk score (Arnett DK, et al., 2019) is: 14.9%   Values used to calculate the score:     Age: 47 years     Clincally relevant sex: Female     Is Non-Hispanic African American: Yes     Diabetic: Yes     Tobacco smoker: No     Systolic Blood Pressure: 153 mmHg     Is BP treated: Yes     HDL Cholesterol: 56 mg/dL     Total Cholesterol: 158 mg/dL   Patient is participating in a Managed Medicaid Plan:  Yes   A/P: Diabetes longstanding with goal control. A1c today is 6.5%, which is down from 7.4 prior. Ozempic  is lowering her exogenous insulin  requirement. She is asymptomatic at this time but has experienced hypoglycemia when she takes Ozempic  + glipizide  + Lantus . I will have her stop glipizide . She is able to verbalize appropriate hypoglycemia management plan.  -Continue Ozempic  2mg  weekly dose.  -DECREASE Lantus  to 15 units daily.  -STOP glipizide  5 mg BID.  -Extensively discussed pathophysiology of diabetes, recommended lifestyle interventions, dietary effects on blood sugar control.  -Counseled on s/sx of and management of hypoglycemia.  -Next A1c anticipated 05/2024.   Written patient instructions provided. Patient verbalized understanding of treatment plan.  Total time in face to face counseling 30 minutes.    Follow-up:  Pharmacist prn. PCP clinic visit 04/15/24.  Herlene Fleeta Morris, PharmD, JAQUELINE, CPP Clinical Pharmacist [facility] [phone]

## 2024-02-19 ENCOUNTER — Other Ambulatory Visit: Payer: Self-pay

## 2024-02-19 ENCOUNTER — Telehealth (INDEPENDENT_AMBULATORY_CARE_PROVIDER_SITE_OTHER): Payer: Self-pay

## 2024-02-19 ENCOUNTER — Other Ambulatory Visit: Payer: Self-pay | Admitting: *Deleted

## 2024-02-19 DIAGNOSIS — G8929 Other chronic pain: Secondary | ICD-10-CM

## 2024-02-19 DIAGNOSIS — H5213 Myopia, bilateral: Secondary | ICD-10-CM | POA: Diagnosis not present

## 2024-02-19 NOTE — Telephone Encounter (Signed)
 I applied for P/A for Mometasone  in Covermymeds

## 2024-02-19 NOTE — Telephone Encounter (Signed)
 Pt called and LVM that she was notified by Ins. That her Rx was denied by insurance. I informed her that Dr. Roark is out of office and I have sent him a message.

## 2024-02-23 ENCOUNTER — Telehealth (INDEPENDENT_AMBULATORY_CARE_PROVIDER_SITE_OTHER): Payer: Self-pay

## 2024-02-23 ENCOUNTER — Telehealth (INDEPENDENT_AMBULATORY_CARE_PROVIDER_SITE_OTHER): Payer: Self-pay | Admitting: Otolaryngology

## 2024-02-23 DIAGNOSIS — H903 Sensorineural hearing loss, bilateral: Secondary | ICD-10-CM

## 2024-02-23 DIAGNOSIS — H9311 Tinnitus, right ear: Secondary | ICD-10-CM

## 2024-02-23 NOTE — Telephone Encounter (Signed)
 Called patient to let them know we had the patients MRI results and Dr. Roark would like to go over them with the Patient. The patient did not answer to I left a voicemail that we had the results and to give us  a call back along with the call back number.

## 2024-02-23 NOTE — Telephone Encounter (Signed)
 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  This does still have the potential of an acoustic neuroma as well as the unilateral tinnitus but it does perhaps explain the tinnitus relative to the hearing loss.  I called twice and left message for discussion.  I will get the office to try getting a hold of her as well to discuss the MRI scan now that I have the audiogram.

## 2024-02-23 NOTE — Addendum Note (Signed)
 Addended by: ROARK NORLEEN HERO on: 02/23/2024 09:31 AM   Modules accepted: Orders

## 2024-02-23 NOTE — Telephone Encounter (Signed)
 She did call back and we discussed the asymmetry.  She is having a lot of headaches.  She now wants to proceed with the MRI scan which will be ordered.  She will follow-up after that study.

## 2024-02-29 ENCOUNTER — Ambulatory Visit: Admitting: Physical Therapy

## 2024-02-29 ENCOUNTER — Encounter: Payer: Self-pay | Admitting: Physical Therapy

## 2024-02-29 ENCOUNTER — Encounter (HOSPITAL_BASED_OUTPATIENT_CLINIC_OR_DEPARTMENT_OTHER): Payer: Self-pay | Admitting: Orthopaedic Surgery

## 2024-02-29 ENCOUNTER — Encounter (HOSPITAL_BASED_OUTPATIENT_CLINIC_OR_DEPARTMENT_OTHER)
Admission: RE | Admit: 2024-02-29 | Discharge: 2024-02-29 | Disposition: A | Source: Ambulatory Visit | Attending: Orthopaedic Surgery | Admitting: Orthopaedic Surgery

## 2024-02-29 ENCOUNTER — Other Ambulatory Visit: Payer: Self-pay

## 2024-02-29 DIAGNOSIS — M5459 Other low back pain: Secondary | ICD-10-CM | POA: Insufficient documentation

## 2024-02-29 DIAGNOSIS — Z01812 Encounter for preprocedural laboratory examination: Secondary | ICD-10-CM | POA: Insufficient documentation

## 2024-02-29 DIAGNOSIS — M6281 Muscle weakness (generalized): Secondary | ICD-10-CM | POA: Diagnosis present

## 2024-02-29 DIAGNOSIS — M25561 Pain in right knee: Secondary | ICD-10-CM | POA: Insufficient documentation

## 2024-02-29 DIAGNOSIS — M25562 Pain in left knee: Secondary | ICD-10-CM | POA: Diagnosis present

## 2024-02-29 DIAGNOSIS — G8929 Other chronic pain: Secondary | ICD-10-CM | POA: Diagnosis not present

## 2024-02-29 LAB — BASIC METABOLIC PANEL WITH GFR
Anion gap: 10 (ref 5–15)
BUN: 13 mg/dL (ref 6–20)
CO2: 27 mmol/L (ref 22–32)
Calcium: 8.8 mg/dL — ABNORMAL LOW (ref 8.9–10.3)
Chloride: 102 mmol/L (ref 98–111)
Creatinine, Ser: 0.82 mg/dL (ref 0.44–1.00)
GFR, Estimated: >60 mL/min (ref >60)
Glucose, Bld: 128 mg/dL — ABNORMAL HIGH (ref 70–99)
Potassium: 4.1 mmol/L (ref 3.5–5.1)
Sodium: 139 mmol/L (ref 135–145)

## 2024-02-29 NOTE — Therapy (Signed)
 OUTPATIENT PHYSICAL THERAPY LOWER EXTREMITY EVALUATION  Patient Name: Julia Dunn MRN: [id number] DOB:[birth year], 53 y.o., female Today's Date: 02/29/2024   PT End of Session - 02/29/24 0752     Visit Number 1    Number of Visits --   1-2x/week   Date for Recertification  04/25/24    Authorization Type [employer]    PT Start Time 9282    PT Stop Time 0757    PT Time Calculation (min) 40 min          Past Medical History:  Diagnosis Date   Anemia Dx 2013   Asthma    Bacterial vaginosis 2008   Complication of anesthesia    pt states was informed had difficult time awakening following hysterectomy    Depression    Depression    Diabetes mellitus Dx 2005   Fibroids 09/05/2011   08/08/2011: Anteverted uterus. Diffuse fibroid involvement. Left of midline fibroid with submucosal component. 3.4cm x 2.8cmx 3.4cm.    GERD (gastroesophageal reflux disease)    Gestational diabetes    H/O shortness of breath    pt states has improved since lossing weight; triggers currently are enviromental pt states can make it to top of stairs w/o having to stop to catch her breath   Headache(784.0)    migraines   History of blood transfusion 2013/2014   History of bronchitis    Hx of blood clots    Hypertension Dx 2015   Kidney infection 2015   Menorrhagia 09/27/2010   Pneumonia    Postpartum depression    Pregnancy induced hypertension    Sleep apnea    mild, no CPAP recommended   Trichomonas    Past Surgical History:  Procedure Laterality Date   ABDOMINAL HYSTERECTOMY N/A 11/03/2012   Procedure: HYSTERECTOMY ABDOMINAL;  Surgeon: Rome Rigg, MD;  Location: [facility];  Service: Gynecology;  Laterality: N/A;   BILATERAL SALPINGECTOMY Bilateral 11/03/2012   Procedure: BILATERAL SALPINGECTOMY;  Surgeon: Rome Rigg, MD;  Location: [facility];  Service: Gynecology;  Laterality: Bilateral;   BURCH PROCEDURE N/A 11/03/2012   Procedure: BURCH PROCEDURE;  Surgeon: Rome Rigg, MD;  Location: [facility];  Service: Gynecology;  Laterality: N/A;   CARPAL TUNNEL RELEASE Right 11/20/2021   Procedure: RIGHT CARPAL TUNNEL RELEASE;  Surgeon: Jerri Kay HERO, MD;  Location: [facility];  Service: Orthopedics;  Laterality: Right;   CYSTOSCOPY N/A 11/03/2012   Procedure: CYSTOSCOPY;  Surgeon: Rome Rigg, MD;  Location: [facility];  Service: Gynecology;  Laterality: N/A;   DILATION AND CURETTAGE OF UTERUS     LAPAROSCOPY N/A 11/03/2012   Procedure: LAPAROSCOPY DIAGNOSTIC;  Surgeon: Rome Rigg, MD;  Location: [facility];  Service: Gynecology;  Laterality: N/A;   ORIF ANKLE FRACTURE Left 01/04/2016   Procedure: OPEN REDUCTION INTERNAL FIXATION (ORIF) LEFT BIMALLEOLAR ANKLE FRACTURE;  Surgeon: Lonni CINDERELLA Poli, MD;  Location: [facility];  Service: Orthopedics;  Laterality: Left;   TUBAL LIGATION     WISDOM TOOTH EXTRACTION     3 teeth   Patient Active Problem List   Diagnosis Date Noted   Chronic pain of left knee 01/26/2024   Depression, major, recurrent, moderate (HCC) 06/13/2022   Bilateral hand pain 08/09/2021   Right carpal tunnel syndrome 08/09/2021   Left carpal tunnel syndrome 08/09/2021   Acute on chronic diastolic congestive heart failure (HCC) 08/03/2021   Closed displaced bimalleolar fracture of left ankle 01/04/2016   Abnormal EKG 06/22/2014   Abnormal CT scan of lung 06/22/2014  Dyspnea 06/19/2014   Microscopic hematuria 04/07/2014   Onychomycosis 05/31/2013   BI-RADS category 3 mammogram result 03/07/2013   Vitamin D  insufficiency 08/13/2011   Morbid obesity (HCC) 08/10/2007   ALLERGIC RHINITIS, SEASONAL 07/14/2006   DM2 (diabetes mellitus, type 2) 05/28/2006   DEPRESSIVE DISORDER, NOS 05/28/2006   HTN (hypertension) 05/28/2006    PCP: [name], MD  REFERRING PROVIDER: Jerri Kay HERO, MD  THERAPY DIAG:  Left knee pain, unspecified chronicity - Plan: PT plan of care cert/re-cert  Right knee pain, unspecified chronicity - Plan: PT plan of care  cert/re-cert  Other low back pain - Plan: PT plan of care cert/re-cert  Muscle weakness - Plan: PT plan of care cert/re-cert  REFERRING DIAG: Chronic pain of left knee [M25.562, G89.29]   Rationale for Evaluation and Treatment:  Rehabilitation  SUBJECTIVE:  PERTINENT PAST HISTORY:  DM TII, Obesity, L ankle fracture, depression, HTN, asthma      PRECAUTIONS: Fall  WEIGHT BEARING RESTRICTIONS No  FALLS:  Has patient fallen in last 6 months? Yes, Number of falls: 2 falls attributes it to clumsiness and weakness  MOI/History of condition:  Onset date: 3 months  SUBJECTIVE STATEMENT  Pt is a 53 y.o. female who presents to clinic with chief complaint of L>R knee pain which which has been going on for about 3 months.  Feel her leg and knee are weak.  Had medial knee pain which prevented her from bending her knee and going up and down steps.  She had sudden onset of swelling with no trauma.  Describes a feeling of pressure release with sudden swelling at start of pain.  Does have history of low back pain with some intermittent LE sxs.     Red flags:  denies   Pain:  Are you having pain? Yes Pain location: Medial L knee pain, some lateral R knee ain NPRS scale:  Best: 3/10, Worst: 7/10 Aggravating factors: stairs, squatting Relieving factors: rest Pain description: sharp, dull, and aching  Occupation: cleans houses  Assistive Device: na  Hand Dominance: na  Patient Goals/Specific Activities: reduce pain   OBJECTIVE:   DIAGNOSTIC FINDINGS:  X-ray shows small effusion   GENERAL OBSERVATION/GAIT: Antalgic gait, reduced time in stance on L   SENSATION: Light touch: Appears intact  PALPATION: TTP medial L knee  MUSCLE LENGTH: Hamstrings: Right no restriction; Left no restriction   LE MMT:  MMT Right (Eval) Left (Eval)  Hip flexion (L2, L3)    Knee extension (L3) 4 3  Knee flexion 4 3  Hip abduction    Hip extension    Hip external rotation    Hip  internal rotation    Hip adduction    Ankle dorsiflexion (L4)    Ankle plantarflexion (S1)    Ankle inversion    Ankle eversion    Great Toe ext (L5)    Grossly     (Blank rows = not tested, score listed is out of 5 possible points OR may be listed in lbs of force.  N = WNL, D = diminished, C = clear for gross weakness with myotome testing, * = concordant pain with testing)  LE ROM:  ROM Right (Eval) Left (Eval)  Hip flexion    Hip extension    Hip abduction    Hip adduction    Hip internal rotation    Hip external rotation    Knee extension n n  Knee flexion n n  Ankle dorsiflexion    Ankle  plantarflexion    Ankle inversion    Ankle eversion     (Blank rows = not tested, N = WNL, * = concordant pain with testing)  Functional Tests  Eval                                                              SPECIAL TESTS:  Slump and SLR (-)   PATIENT SURVEYS:  LEFS: 32/80  HOME EXERCISE PROGRAM: Access Code: [email or web address] URL: [email or web address] Date: 02/29/2024 Prepared by: Helene Gasmen  Exercises - Supine Quad Set  - 3 x daily - 7 x weekly - 2 sets - 10 reps - 5 second hold - Active Straight Leg Raise with Quad Set  - 1 x daily - 7 x weekly - 2 sets - 10 reps - Supine Lower Trunk Rotation  - 1-2 x daily - 7 x weekly - 1 sets - 20 reps - 3 hold  Treatment priorities   Eval        Quad activation/strengthening         Look at low back PRN d/t L LE weakness                                 TODAY'S TREATMENT:  Therapeutic Exercise: Creating, reviewing, and completing HEP   PATIENT EDUCATION ([place]/HM):  POC, diagnosis, prognosis, HEP, and outcome measures.  Pt educated via explanation, demonstration, and handout (HEP).  Pt confirms understanding verbally.   ASSESSMENT:  CLINICAL IMPRESSION: Asna is a 53 y.o. female who presents to clinic with signs and sxs consistent with L knee pain.  No specific trauma but pt does have point  tenderness of MCL L knee.  X-ray (-) for significant OA.  Significant weakness about the L knee with no pain on testing today; may consider low back origin if there is no benefit from local strengthening.  Parissa will benefit from skilled PT to address relevant deficits and improve comfort with daily tasks.   OBJECTIVE IMPAIRMENTS: Pain, L knee strength, gait, balance  ACTIVITY LIMITATIONS: walking, standing, squatting, stairs, housework, work, driving  PERSONAL FACTORS: See medical history and pertinent history   REHAB POTENTIAL: Good  CLINICAL DECISION MAKING: Evolving/moderate complexity  EVALUATION COMPLEXITY: Moderate   GOALS:   SHORT TERM GOALS: Target date: 03/28/2024   Sigourney will be >75% HEP compliant to improve carryover between sessions and facilitate independent management of condition  Evaluation: ongoing Goal status: INITIAL   LONG TERM GOALS: Target date: 04/25/2024   Ross will self report >/= 50% decrease in pain from evaluation to improve function in daily tasks  Evaluation/Baseline: 7/10 max pain Goal status: INITIAL   2.  Kasara will show a >/= 18 pt improvement in LEFS score (MCID is ~11% or 9 pts) as a proxy for functional improvement   Evaluation/Baseline: 32 pts Goal status: INITIAL   3.  Lyna will be able to go up and down 10 stairs, not limited by pain  Evaluation/Baseline: limited Goal status: INITIAL   4.  Rubi will improve the following MMTs to >/= 4/5 to show improvement in strength:  L knee   Evaluation/Baseline: see chart in note Goal status: INITIAL    PLAN: PT FREQUENCY: 1-2x/week  PT  DURATION: 8 weeks  PLANNED INTERVENTIONS:  [id number]- PT Re-evaluation, 97110-Therapeutic exercises, 97530- Therapeutic activity, [id number]- Neuromuscular re-education, 97535- Self Care, [id number]- Manual therapy, [id number]- Gait training, [id number]- Aquatic Therapy, [phone]- Electrical stimulation (manual), [id number]- Vasopneumatic device, [id number]- Traction (mechanical),  [id number]- Ionotophoresis 4mg /ml Dexamethasone , Taping, Dry Needling, Joint manipulation, and Spinal manipulation.   Naly Schwanz E Lynniah Janoski PT 02/29/2024, 8:03 AM

## 2024-03-01 ENCOUNTER — Other Ambulatory Visit: Payer: Self-pay

## 2024-03-01 ENCOUNTER — Other Ambulatory Visit (INDEPENDENT_AMBULATORY_CARE_PROVIDER_SITE_OTHER): Payer: Self-pay | Admitting: Otolaryngology

## 2024-03-01 ENCOUNTER — Telehealth (INDEPENDENT_AMBULATORY_CARE_PROVIDER_SITE_OTHER): Payer: Self-pay

## 2024-03-01 DIAGNOSIS — H903 Sensorineural hearing loss, bilateral: Secondary | ICD-10-CM

## 2024-03-01 DIAGNOSIS — H9311 Tinnitus, right ear: Secondary | ICD-10-CM

## 2024-03-01 NOTE — Telephone Encounter (Signed)
 Consuelo from [facility] called about patient stating that the brain CT scan that was ordered needs to be adjusted and a IC charge needs to be added. I let Dr. Roark know.   Consuelo stated she would like a call back office number [phone]

## 2024-03-02 ENCOUNTER — Ambulatory Visit (HOSPITAL_BASED_OUTPATIENT_CLINIC_OR_DEPARTMENT_OTHER): Admitting: Anesthesiology

## 2024-03-02 ENCOUNTER — Ambulatory Visit (HOSPITAL_BASED_OUTPATIENT_CLINIC_OR_DEPARTMENT_OTHER)
Admission: RE | Admit: 2024-03-02 | Discharge: 2024-03-02 | Disposition: A | Attending: Orthopaedic Surgery | Admitting: Orthopaedic Surgery

## 2024-03-02 ENCOUNTER — Encounter (HOSPITAL_BASED_OUTPATIENT_CLINIC_OR_DEPARTMENT_OTHER)
Admission: RE | Disposition: A | Discharge status: Home / Self Care | Payer: Self-pay | Source: Home / Self Care | Attending: Orthopaedic Surgery

## 2024-03-02 ENCOUNTER — Encounter (HOSPITAL_BASED_OUTPATIENT_CLINIC_OR_DEPARTMENT_OTHER): Payer: Self-pay | Admitting: Orthopaedic Surgery

## 2024-03-02 ENCOUNTER — Other Ambulatory Visit: Payer: Self-pay | Admitting: Physician Assistant

## 2024-03-02 ENCOUNTER — Other Ambulatory Visit: Payer: Self-pay

## 2024-03-02 ENCOUNTER — Ambulatory Visit (HOSPITAL_COMMUNITY)
Admission: RE | Admit: 2024-03-02 | Discharge: 2024-03-02 | Disposition: A | Source: Ambulatory Visit | Attending: Otolaryngology | Admitting: Otolaryngology

## 2024-03-02 DIAGNOSIS — H9311 Tinnitus, right ear: Secondary | ICD-10-CM | POA: Insufficient documentation

## 2024-03-02 DIAGNOSIS — I110 Hypertensive heart disease with heart failure: Secondary | ICD-10-CM | POA: Diagnosis not present

## 2024-03-02 DIAGNOSIS — G5602 Carpal tunnel syndrome, left upper limb: Secondary | ICD-10-CM

## 2024-03-02 DIAGNOSIS — I5033 Acute on chronic diastolic (congestive) heart failure: Secondary | ICD-10-CM

## 2024-03-02 DIAGNOSIS — H903 Sensorineural hearing loss, bilateral: Secondary | ICD-10-CM

## 2024-03-02 DIAGNOSIS — E119 Type 2 diabetes mellitus without complications: Secondary | ICD-10-CM

## 2024-03-02 DIAGNOSIS — Z01818 Encounter for other preprocedural examination: Secondary | ICD-10-CM

## 2024-03-02 DIAGNOSIS — H9319 Tinnitus, unspecified ear: Secondary | ICD-10-CM | POA: Diagnosis not present

## 2024-03-02 DIAGNOSIS — Q048 Other specified congenital malformations of brain: Secondary | ICD-10-CM | POA: Diagnosis not present

## 2024-03-02 HISTORY — PX: CARPAL TUNNEL RELEASE: SHX101

## 2024-03-02 LAB — GLUCOSE, CAPILLARY
Glucose-Capillary: 106 mg/dL — ABNORMAL HIGH (ref 70–99)
Glucose-Capillary: 81 mg/dL (ref 70–99)

## 2024-03-02 SURGERY — CARPAL TUNNEL RELEASE
Anesthesia: Monitor Anesthesia Care | Site: Hand | Laterality: Left

## 2024-03-02 MED ORDER — LIDOCAINE 2% (20 MG/ML) 5 ML SYRINGE
INTRAMUSCULAR | Status: AC
  Filled 2024-03-02: qty 5

## 2024-03-02 MED ORDER — CEFAZOLIN SODIUM-DEXTROSE 2-4 GM/100ML-% IV SOLN
2.0000 g | INTRAVENOUS | Status: AC
  Administered 2024-03-02: 2 g via INTRAVENOUS

## 2024-03-02 MED ORDER — ONDANSETRON HCL 4 MG/2ML IJ SOLN
INTRAMUSCULAR | Status: AC
  Filled 2024-03-02: qty 2

## 2024-03-02 MED ORDER — ONDANSETRON HCL 4 MG/2ML IJ SOLN: INTRAMUSCULAR | Status: DC | PRN

## 2024-03-02 MED ORDER — ONDANSETRON HCL 4 MG/2ML IJ SOLN
4.0000 mg | Freq: Once | INTRAMUSCULAR | Status: AC
  Administered 2024-03-02: 4 mg via INTRAVENOUS

## 2024-03-02 MED ORDER — MIDAZOLAM HCL 2 MG/2ML IJ SOLN
INTRAMUSCULAR | Status: AC
  Filled 2024-03-02: qty 2

## 2024-03-02 MED ORDER — LIDOCAINE-EPINEPHRINE (PF) 1 %-1:200000 IJ SOLN
INTRAMUSCULAR | Status: DC | PRN
  Administered 2024-03-02: 10 mL

## 2024-03-02 MED ORDER — LACTATED RINGERS IV SOLN: INTRAVENOUS | Status: DC

## 2024-03-02 MED ORDER — CEFAZOLIN SODIUM-DEXTROSE 2-4 GM/100ML-% IV SOLN
INTRAVENOUS | Status: AC
  Filled 2024-03-02: qty 100

## 2024-03-02 MED ORDER — KETOROLAC TROMETHAMINE 30 MG/ML IJ SOLN
INTRAMUSCULAR | Status: AC
  Filled 2024-03-02: qty 3

## 2024-03-02 MED ORDER — MIDAZOLAM HCL 5 MG/5ML IJ SOLN
INTRAMUSCULAR | Status: DC | PRN
  Administered 2024-03-02 (×2): 1 mg via INTRAVENOUS

## 2024-03-02 MED ORDER — HYDROCODONE-ACETAMINOPHEN 5-325 MG PO TABS
1.0000 | ORAL_TABLET | Freq: Three times a day (TID) | ORAL | 0 refills | Status: AC | PRN
  Filled 2024-03-02: qty 15, 5d supply, fill #0

## 2024-03-02 MED ORDER — ATROPINE SULFATE (PF) 0.4 MG/ML IJ SOLN
INTRAMUSCULAR | Status: AC
  Filled 2024-03-02: qty 1

## 2024-03-02 MED ORDER — FENTANYL CITRATE (PF) 100 MCG/2ML IJ SOLN
INTRAMUSCULAR | Status: AC
  Filled 2024-03-02: qty 2

## 2024-03-02 MED ORDER — PROPOFOL 500 MG/50ML IV EMUL
INTRAVENOUS | Status: DC | PRN
  Administered 2024-03-02: 100 ug/kg/min via INTRAVENOUS

## 2024-03-02 MED ORDER — PROPOFOL 500 MG/50ML IV EMUL
INTRAVENOUS | Status: AC
  Filled 2024-03-02: qty 50

## 2024-03-02 MED ORDER — GADOBUTROL 1 MMOL/ML IV SOLN
10.0000 mL | Freq: Once | INTRAVENOUS | Status: AC | PRN
  Administered 2024-03-02: 10 mL via INTRAVENOUS

## 2024-03-02 MED ORDER — FENTANYL CITRATE (PF) 100 MCG/2ML IJ SOLN
INTRAMUSCULAR | Status: DC | PRN
  Administered 2024-03-02 (×2): 50 ug via INTRAVENOUS

## 2024-03-02 MED ORDER — SUCCINYLCHOLINE CHLORIDE 200 MG/10ML IV SOSY
PREFILLED_SYRINGE | INTRAVENOUS | Status: AC
  Filled 2024-03-02: qty 10

## 2024-03-02 MED ORDER — PROPOFOL 10 MG/ML IV BOLUS
INTRAVENOUS | Status: DC | PRN
  Administered 2024-03-02: 20 mg via INTRAVENOUS

## 2024-03-02 MED ORDER — PHENYLEPHRINE 80 MCG/ML (10ML) SYRINGE FOR IV PUSH (FOR BLOOD PRESSURE SUPPORT)
PREFILLED_SYRINGE | INTRAVENOUS | Status: AC
  Filled 2024-03-02: qty 10

## 2024-03-02 MED ORDER — 0.9 % SODIUM CHLORIDE (POUR BTL) OPTIME
TOPICAL | Status: DC | PRN
  Administered 2024-03-02: 75 mL

## 2024-03-02 MED ORDER — LIDOCAINE-EPINEPHRINE (PF) 1 %-1:200000 IJ SOLN
INTRAMUSCULAR | Status: AC
  Filled 2024-03-02: qty 30

## 2024-03-02 MED ORDER — LIDOCAINE HCL (CARDIAC) PF 100 MG/5ML IV SOSY
PREFILLED_SYRINGE | INTRAVENOUS | Status: DC | PRN
  Administered 2024-03-02: 20 mg via INTRAVENOUS

## 2024-03-02 MED ORDER — ONDANSETRON HCL 4 MG PO TABS
4.0000 mg | ORAL_TABLET | Freq: Three times a day (TID) | ORAL | 0 refills | Status: AC | PRN
  Filled 2024-03-02: qty 40, 14d supply, fill #0

## 2024-03-02 MED ORDER — DEXMEDETOMIDINE HCL IN NACL 80 MCG/20ML IV SOLN
INTRAVENOUS | Status: AC
  Filled 2024-03-02: qty 40

## 2024-03-02 MED ORDER — EPHEDRINE 5 MG/ML INJ
INTRAVENOUS | Status: AC
  Filled 2024-03-02: qty 5

## 2024-03-02 SURGICAL SUPPLY — 39 items
BAND RUBBER #18 3X1/16 STRL (MISCELLANEOUS) ×2 IMPLANT
BLADE MINI RND TIP GREEN BEAV (BLADE) ×1 IMPLANT
BLADE SURG 15 STRL LF DISP TIS (BLADE) ×1 IMPLANT
BNDG COMPR ESMARK 4X3 LF (GAUZE/BANDAGES/DRESSINGS) ×1 IMPLANT
BNDG ELASTIC 3INX 5YD STR LF (GAUZE/BANDAGES/DRESSINGS) ×1 IMPLANT
BRUSH SCRUB EZ PLAIN DRY (MISCELLANEOUS) ×1 IMPLANT
CANISTER SUCT 1200ML W/VALVE (MISCELLANEOUS) ×1 IMPLANT
CORD BIPOLAR FORCEPS 12FT (ELECTRODE) ×1 IMPLANT
COVER BACK TABLE 60X90IN (DRAPES) ×1 IMPLANT
CUFF TOURN SGL QUICK 18X4 (TOURNIQUET CUFF) ×1 IMPLANT
DRAPE EXTREMITY T 121X128X90 (DISPOSABLE) ×1 IMPLANT
DRAPE SURG 17X23 STRL (DRAPES) ×1 IMPLANT
DRAPE U-SHAPE 47X51 STRL (DRAPES) ×1 IMPLANT
DURAPREP 26ML APPLICATOR (WOUND CARE) ×1 IMPLANT
GAUZE SPONGE 4X4 12PLY STRL (GAUZE/BANDAGES/DRESSINGS) ×1 IMPLANT
GAUZE XEROFORM 1X8 LF (GAUZE/BANDAGES/DRESSINGS) ×1 IMPLANT
GLOVE BIOGEL PI IND STRL 7.0 (GLOVE) IMPLANT
GLOVE BIOGEL PI IND STRL 7.5 (GLOVE) ×1 IMPLANT
GLOVE ECLIPSE 7.0 STRL STRAW (GLOVE) ×1 IMPLANT
GLOVE INDICATOR 7.0 STRL GRN (GLOVE) ×1 IMPLANT
GLOVE SURG SYN 7.5 PF PI (GLOVE) ×2 IMPLANT
GOWN STRL REUS W/ TWL LRG LVL3 (GOWN DISPOSABLE) ×1 IMPLANT
GOWN STRL SURGICAL XL XLNG (GOWN DISPOSABLE) ×2 IMPLANT
NDL HYPO 25X1 1.5 SAFETY (NEEDLE) ×1 IMPLANT
PACK BASIN DAY SURGERY FS (CUSTOM PROCEDURE TRAY) ×1 IMPLANT
PAD CAST 3X4 CTTN HI CHSV (CAST SUPPLIES) ×1 IMPLANT
SHEET MEDIUM DRAPE 40X70 STRL (DRAPES) ×1 IMPLANT
SOL PREP POV-IOD 4OZ 10% (MISCELLANEOUS) ×1 IMPLANT
SOLN 0.9% NACL POUR BTL 1000ML (IV SOLUTION) ×1 IMPLANT
SOLUTION SCRB POV-IOD 4OZ 7.5% (MISCELLANEOUS) ×1 IMPLANT
SPIKE FLUID TRANSFER (MISCELLANEOUS) IMPLANT
SPONGE T-LAP 18X18 ~~LOC~~+RFID (SPONGE) ×1 IMPLANT
STOCKINETTE 4X48 STRL (DRAPES) ×1 IMPLANT
SUT ETHILON 3 0 PS 1 (SUTURE) ×1 IMPLANT
SYR BULB EAR ULCER 3OZ GRN STR (SYRINGE) ×1 IMPLANT
SYR CONTROL 10ML LL (SYRINGE) IMPLANT
TOWEL GREEN STERILE FF (TOWEL DISPOSABLE) ×1 IMPLANT
TRAY DSU PREP LF (CUSTOM PROCEDURE TRAY) ×1 IMPLANT
UNDERPAD 30X36 HEAVY ABSORB (UNDERPADS AND DIAPERS) ×1 IMPLANT

## 2024-03-02 NOTE — Op Note (Signed)
   Carpal tunnel op note  DATE OF SURGERY:03/02/2024  PREOPERATIVE DIAGNOSIS:  Left carpal tunnel syndrome  POSTOPERATIVE DIAGNOSIS: same  PROCEDURE: Left carpal tunnel release. CPT 35278  SURGEON: Kay Ozell Cummins, M.D.  ASSIST: Ronal Morna Grave, [place]  ANESTHESIA:  Local and MAC  TOURNIQUET TIME: less than 20 minutes  BLOOD LOSS: Minimal.  COMPLICATIONS: None.  PATHOLOGY: None.  INDICATIONS: The patient is a 53 y.o. -year-old female who presented with carpal tunnel syndrome failing nonsurgical management, indicated for surgical release.  DESCRIPTION OF PROCEDURE: The patient was identified in the preoperative holding area.  The operative site was marked by the surgeon and confirmed by the patient.  The patient was brought back to the operating room.  MAC anesthesia was administered.  Local anesthetic with epi was injected into the operative site.  A well padded nonsterile tourniquet was placed. The operative extremity was prepped and draped in standard sterile fashion.  A timeout was performed.  Preoperative antibiotics were given.   A palmar incision was made about 5 mm ulnar to the thenar crease.  The palmar aponeurosis was exposed and divided in line with the skin incision. The palmaris brevis was visualized and divided.  The distal edge of the transcarpal ligament was identified. A hemostat was inserted into the carpal tunnel to protect the median nerve and the flexor tendons. Then, the transverse carpal ligament was released under direct visualization. Proximally, a subcutaneous tunnel was made allowing a Sewell retractor to be placed. Then, the distal portion of the antebrachial fascia was released. Distally, all fibrous bands were released. The median nerve was visualized, and the fat pad was exposed. Wound was irrigated and closed with 3-0 nylon sutures. Sterile dressing applied. The patient was transferred to the recovery room in stable condition after all counts were  correct.  POSTOPERATIVE PLAN: To start nerve gliding exercises as tolerated and no heavy lifting for four weeks.  Ozell Cummins, M.D. [facility] 5:29 PM

## 2024-03-02 NOTE — Discharge Instructions (Addendum)
 Postoperative instructions:  Weightbearing instructions: don't lift more than 10 lbs for 4 weeks  Dressing instructions: Keep your dressing and/or splint clean and dry at all times.  It will be removed at your first post-operative appointment.  Your stitches and/or staples will be removed at this visit.  Incision instructions:  Do not soak your incision for 3 weeks after surgery.  If the incision gets wet, pat dry and do not scrub the incision.  Pain control:  You have been given a prescription to be taken as directed for post-operative pain control.  In addition, elevate the operative extremity above the heart at all times to prevent swelling and throbbing pain.  Take over-the-counter Colace, 100mg  by mouth twice a day while taking narcotic pain medications to help prevent constipation.  Follow up appointments: 1) 10 days for suture removal and wound check. 2) Dr. Roda Shutters as scheduled.   -------------------------------------------------------------------------------------------------------------  After Surgery Pain Control:  After your surgery, post-surgical discomfort or pain is likely. This discomfort can last several days to a few weeks. At certain times of the day your discomfort may be more intense.  Did you receive a nerve block?  A nerve block can provide pain relief for one hour to two days after your surgery. As long as the nerve block is working, you will experience little or no sensation in the area the surgeon operated on.  As the nerve block wears off, you will begin to experience pain or discomfort. It is very important that you begin taking your prescribed pain medication before the nerve block fully wears off. Treating your pain at the first sign of the block wearing off will ensure your pain is better controlled and more tolerable when full-sensation returns. Do not wait until the pain is intolerable, as the medicine will be less effective. It is better to treat pain in  advance than to try and catch up.  General Anesthesia:  If you did not receive a nerve block during your surgery, you will need to start taking your pain medication shortly after your surgery and should continue to do so as prescribed by your surgeon.  Pain Medication:  Most commonly we prescribe Vicodin and Percocet for post-operative pain. Both of these medications contain a combination of acetaminophen (Tylenol) and a narcotic to help control pain.   It takes between 30 and 45 minutes before pain medication starts to work. It is important to take your medication before your pain level gets too intense.   Nausea is a common side effect of many pain medications. You will want to eat something before taking your pain medicine to help prevent nausea.   If you are taking a prescription pain medication that contains acetaminophen, we recommend that you do not take additional over the counter acetaminophen (Tylenol).  Other pain relieving options:   Using a cold pack to ice the affected area a few times a day (15 to 20 minutes at a time) can help to relieve pain, reduce swelling and bruising.   Elevation of the affected area can also help to reduce pain and swelling.  Per [facility] clinic policy, our goal is ensure optimal postoperative pain control with a multimodal pain management strategy. For all [facility] patients, our goal is to wean post-operative narcotic medications by 6 weeks post-operatively. If this is not possible due to utilization of pain medication prior to surgery, your [facility] doctor will support your acute post-operative pain control for the first 6 weeks postoperatively, with a  plan to transition you back to your primary pain team following that. Cyndia Skeeters will work to ensure a Therapist, occupational.   Post Anesthesia Home Care Instructions  Activity: Get plenty of rest for the remainder of the day. A responsible individual must stay with you for 24 hours following the procedure.   For the next 24 hours, DO NOT: -Drive a car -Advertising copywriter -Drink alcoholic beverages -Take any medication unless instructed by your physician -Make any legal decisions or sign important papers.  Meals: Start with liquid foods such as gelatin or soup. Progress to regular foods as tolerated. Avoid greasy, spicy, heavy foods. If nausea and/or vomiting occur, drink only clear liquids until the nausea and/or vomiting subsides. Call your physician if vomiting continues.  Special Instructions/Symptoms: Your throat may feel dry or sore from the anesthesia or the breathing tube placed in your throat during surgery. If this causes discomfort, gargle with warm salt water. The discomfort should disappear within 24 hours.  If you had a scopolamine patch placed behind your ear for the management of post- operative nausea and/or vomiting:  1. The medication in the patch is effective for 72 hours, after which it should be removed.  Wrap patch in a tissue and discard in the trash. Wash hands thoroughly with soap and water. 2. You may remove the patch earlier than 72 hours if you experience unpleasant side effects which may include dry mouth, dizziness or visual disturbances. 3. Avoid touching the patch. Wash your hands with soap and water after contact with the patch.

## 2024-03-02 NOTE — Transfer of Care (Signed)
 Immediate Anesthesia Transfer of Care Note  Patient: Madelynn Malson  Procedure(s) Performed: CARPAL TUNNEL RELEASE (Left: Hand)  Patient Location: PACU  Anesthesia Type:MAC  Level of Consciousness: awake, alert , and oriented  Airway & Oxygen Therapy: Patient Spontanous Breathing and Patient connected to face mask oxygen  Post-op Assessment: Report given to RN and Post -op Vital signs reviewed and stable  Post vital signs: Reviewed and stable  Last Vitals:  Vitals Value Taken Time  BP    Temp    Pulse 66 03/02/24 15:26  Resp    SpO2 97 % 03/02/24 15:26  Vitals shown include unfiled device data.  Last Pain:  Vitals:   03/02/24 1325  PainSc: 8       Patients Stated Pain Goal: 5 (03/02/24 1325)  Complications: No notable events documented.

## 2024-03-02 NOTE — Anesthesia Preprocedure Evaluation (Addendum)
 Anesthesia Evaluation  Patient identified by MRN, date of birth, ID band Patient awake    Reviewed: Allergy & Precautions, NPO status , Patient's Chart, lab work & pertinent test results, reviewed documented beta blocker date and time   History of Anesthesia Complications (+) PROLONGED EMERGENCE and history of anesthetic complications  Airway Mallampati: III  TM Distance: >3 FB Neck ROM: Full    Dental   Pulmonary shortness of breath and with exertion, asthma , sleep apnea and Continuous Positive Airway Pressure Ventilation , pneumonia, resolved   Pulmonary exam normal        Cardiovascular hypertension, Pt. on medications and Pt. on home beta blockers +CHF  Normal cardiovascular exam  EKG 04/23/23 NSR, RAD, possible anterior infarct  Echo 11/24/19 1. Left ventricular ejection fraction, by estimation, is 50 to 55%. The  left ventricle has low normal function. The left ventricle has no regional  wall motion abnormalities. There is moderate asymmetric left ventricular  hypertrophy of the posterior segment. Left ventricular diastolic parameters are consistent with Grade I diastolic dysfunction (impaired relaxation). Elevated left ventricular end-diastolic pressure. The average left ventricular global longitudinal  strain is -16.9 %. The global longitudinal strain is normal.   2. Right ventricular systolic function is normal. The right ventricular  size is normal.   3. The mitral valve is normal in structure. No evidence of mitral valve  regurgitation. No evidence of mitral stenosis.   4. The aortic valve is normal in structure. Aortic valve regurgitation is  trivial. No aortic stenosis is present.   5. The inferior vena cava is normal in size with greater than 50%  respiratory variability, suggesting right atrial pressure of 3 mmHg.      Neuro/Psych  Headaches PSYCHIATRIC DISORDERS  Depression    Left Carpal tunnel syndrome   Neuromuscular disease    GI/Hepatic Neg liver ROS,GERD  Medicated,,  Endo/Other  diabetes, Well Controlled, Type 2, Insulin  Dependent  Class 3 obesityGLP-1 RA therapy- last dose 11/21 HLD  Renal/GU Renal diseaseLab Results      Component                Value               Date                      NA                       139                 02/29/2024                CL                       102                 02/29/2024                K                        4.1                 02/29/2024                CO2                      27  02/29/2024                BUN                      13                  02/29/2024                CREATININE               0.82                02/29/2024                GFRNONAA                 >60                 02/29/2024                CALCIUM                   8.8 (L)             02/29/2024                ALBUMIN                  3.5                 06/11/2023                GLUCOSE                  128 (H)             02/29/2024             negative genitourinary   Musculoskeletal negative musculoskeletal ROS (+)    Abdominal   Peds  Hematology  (+) Blood dyscrasia, anemia Lab Results      Component                Value               Date                      WBC                      15.1 (H)            06/11/2023                HGB                      14.5                06/11/2023                HCT                      43.8                06/11/2023                MCV                      86.9                06/11/2023  PLT                      175                 06/11/2023               Anesthesia Other Findings   Reproductive/Obstetrics negative OB ROS                              Anesthesia Physical Anesthesia Plan  ASA: 3  Anesthesia Plan: MAC   Post-op Pain Management: Minimal or no pain anticipated and Tylenol  PO (pre-op)*   Induction: Intravenous  PONV Risk  Score and Plan: 3 and Treatment may vary due to age or medical condition, Midazolam , Propofol  infusion and Ondansetron   Airway Management Planned: Natural Airway and Simple Face Mask  Additional Equipment: None  Intra-op Plan:   Post-operative Plan:   Informed Consent: I have reviewed the patients History and Physical, chart, labs and discussed the procedure including the risks, benefits and alternatives for the proposed anesthesia with the patient or authorized representative who has indicated his/her understanding and acceptance.       Plan Discussed with:   Anesthesia Plan Comments:          Anesthesia Quick Evaluation

## 2024-03-02 NOTE — H&P (Signed)
 PREOPERATIVE H&P  Chief Complaint: left carpal tunnel syndrome  HPI: Julia Dunn is a 53 y.o. female who presents for surgical treatment of left carpal tunnel syndrome.  She denies any changes in medical history.  Past Surgical History:  Procedure Laterality Date   ABDOMINAL HYSTERECTOMY N/A 11/03/2012   Procedure: HYSTERECTOMY ABDOMINAL;  Surgeon: Rome Rigg, MD;  Location: [facility];  Service: Gynecology;  Laterality: N/A;   BILATERAL SALPINGECTOMY Bilateral 11/03/2012   Procedure: BILATERAL SALPINGECTOMY;  Surgeon: Rome Rigg, MD;  Location: [facility];  Service: Gynecology;  Laterality: Bilateral;   BURCH PROCEDURE N/A 11/03/2012   Procedure: BURCH PROCEDURE;  Surgeon: Rome Rigg, MD;  Location: [facility];  Service: Gynecology;  Laterality: N/A;   CARPAL TUNNEL RELEASE Right 11/20/2021   Procedure: RIGHT CARPAL TUNNEL RELEASE;  Surgeon: Jerri Kay HERO, MD;  Location: [facility];  Service: Orthopedics;  Laterality: Right;   CYSTOSCOPY N/A 11/03/2012   Procedure: CYSTOSCOPY;  Surgeon: Rome Rigg, MD;  Location: [facility];  Service: Gynecology;  Laterality: N/A;   DILATION AND CURETTAGE OF UTERUS     LAPAROSCOPY N/A 11/03/2012   Procedure: LAPAROSCOPY DIAGNOSTIC;  Surgeon: Rome Rigg, MD;  Location: [facility];  Service: Gynecology;  Laterality: N/A;   ORIF ANKLE FRACTURE Left 01/04/2016   Procedure: OPEN REDUCTION INTERNAL FIXATION (ORIF) LEFT BIMALLEOLAR ANKLE FRACTURE;  Surgeon: Lonni CINDERELLA Poli, MD;  Location: [facility];  Service: Orthopedics;  Laterality: Left;   TUBAL LIGATION     WISDOM TOOTH EXTRACTION     3 teeth   Social History   Socioeconomic History   Marital status: Significant Other    Spouse name: Not on file   Number of children: 3   Years of education: Not on file   Highest education level: Not on file  Occupational History   Occupation: Unemployed  Tobacco Use   Smoking status: Never   Smokeless tobacco: Never  Vaping Use   Vaping  status: Never Used  Substance and Sexual Activity   Alcohol use: Yes    Comment: social   Drug use: No   Sexual activity: Yes    Birth control/protection: Surgical    Comment: hysterectomy  Other Topics Concern   Not on file  Social History Narrative   Not on file   Social Drivers of Health   Financial Resource Strain: Medium Risk (02/09/2024)   Overall Financial Resource Strain (CARDIA)    Difficulty of Paying Living Expenses: Somewhat hard  Food Insecurity: No Food Insecurity (07/28/2023)   Hunger Vital Sign    Worried About Running Out of Food in the Last Year: Never true    Ran Out of Food in the Last Year: Never true  Transportation Needs: No Transportation Needs (07/28/2023)   PRAPARE - Administrator, Civil Service (Medical): No    Lack of Transportation (Non-Medical): No  Physical Activity: Inactive (02/09/2024)   Exercise Vital Sign    Days of Exercise per Week: 0 days    Minutes of Exercise per Session: 0 min  Stress: Stress Concern Present (02/09/2024)   [employer] of Occupational Health - Occupational Stress Questionnaire    Feeling of Stress: To some extent  Social Connections: Socially Isolated (02/09/2024)   Social Connection and Isolation Panel    Frequency of Communication with Friends and Family: More than three times a week    Frequency of Social Gatherings with Friends and Family: More than three times a week    Attends Religious  Services: Never    Database Administrator or Organizations: No    Attends Engineer, Structural: Never    Marital Status: Never married   Family History  Problem Relation Age of Onset   Heart disease Mother    Rheumatic fever Mother    Hypertension Mother    Stroke Mother    CAD Mother    Hypertension Father    Hypertension Brother    Stroke Brother    Sickle cell trait Daughter    Heart disease Paternal Aunt    Breast cancer Paternal Aunt 69 - 12   Breast cancer Paternal Grandmother 48 - 69    Allergies  Allergen Reactions   Metformin  And Related Other (See Comments)    GI upset even with low dose.   Prior to Admission medications   Medication Sig Start Date End Date Taking? Authorizing Provider  amLODipine  (NORVASC ) 10 MG tablet Take 1 tablet (10 mg total) by mouth daily. 09/21/23  Yes [name], MD  atorvastatin  (LIPITOR) 20 MG tablet Take 1 tablet (20 mg total) by mouth daily after supper. 09/21/23  Yes [name], MD  carvedilol  (COREG ) 3.125 MG tablet Take 1 tablet (3.125 mg total) by mouth 2 (two) times daily with a meal. 09/21/23  Yes [name], MD  FLUoxetine  (PROZAC ) 20 MG capsule Take 1 capsule (20 mg total) by mouth daily. 02/10/24  Yes [name], MD  furosemide  (LASIX ) 20 MG tablet Take 1 tablet (20 mg total) by mouth daily. 09/21/23  Yes [name], MD  insulin  glargine (LANTUS ) 100 UNIT/ML Solostar Pen Inject 15 Units into the skin at bedtime. 02/18/24  Yes [name], MD  lisinopril -hydrochlorothiazide  (ZESTORETIC ) 20-12.5 MG tablet Take 2 tablets by mouth daily. 09/21/23  Yes [name], MD  valACYclovir  (VALTREX ) 500 MG tablet Take 2 tablets (1,000 mg total) by mouth daily. 06/29/23  Yes Mayers, Cari S, PA-C  Vitamin D , Ergocalciferol , (DRISDOL ) 1.25 MG (50000 UNIT) CAPS capsule Take 1 capsule (50,000 Units total) by mouth every 7 (seven) days. 02/10/24  Yes [name], MD  albuterol  (VENTOLIN  HFA) 108 (90 Base) MCG/ACT inhaler Inhale 2 puffs into the lungs every 6 (six) hours as needed for wheezing or shortness of breath. 05/28/23   Mayers, Cari S, PA-C  cyclobenzaprine  (FLEXERIL ) 5 MG tablet Take 1 tablet (5 mg total) by mouth 2 (two) times daily as needed for muscle spasms. 11/12/23   [name], MD  diclofenac  Sodium (VOLTAREN ) 1 % GEL Apply 4 g topically 4 (four) times daily as needed. 03/14/19   Hilts, Ozell, MD  HYDROcodone -acetaminophen  (NORCO/VICODIN) 5-325 MG tablet Take 1 tablet by mouth  3 (three) times daily as needed. 03/02/24   Jule Ronal CROME, PA-C  Insulin  Pen Needle (TRUEPLUS PEN NEEDLES) 32G X 4 MM MISC Use to inject insulin  daily. 05/28/23   Mayers, Cari S, PA-C  mometasone  (NASONEX ) 50 MCG/ACT nasal spray Place 2 sprays into the nose daily. 02/16/24   Roark Rush, MD  omeprazole  (PRILOSEC) 20 MG capsule TAKE 1 CAPSULE (20 MG TOTAL) BY MOUTH DAILY. 01/02/21 01/13/24  [name], MD  ondansetron  (ZOFRAN ) 4 MG tablet Take 1 tablet (4 mg total) by mouth every 8 (eight) hours as needed for nausea or vomiting. 03/02/24   Jule Ronal CROME, PA-C  ONE TOUCH ULTRA TEST test strip USE  STRIP TO CHECK GLUCOSE THREE TIMES DAILY BEFORE MEAL(S) 10/23/16   Newlin, Enobong, MD  Semaglutide ,  2 MG/DOSE, 8 MG/3ML SOPN Inject 2 mg as directed once a week. 12/22/23   [name], MD     Positive ROS: All other systems have been reviewed and were otherwise negative with the exception of those mentioned in the HPI and as above.  Physical Exam: General: Alert, no acute distress Cardiovascular: No pedal edema Respiratory: No cyanosis, no use of accessory musculature GI: abdomen soft Skin: No lesions in the area of chief complaint Neurologic: Sensation intact distally Psychiatric: Patient is competent for consent with normal mood and affect Lymphatic: no lymphedema  MUSCULOSKELETAL: exam stable  Assessment: left carpal tunnel syndrome  Plan: Plan for Procedure(s): CARPAL TUNNEL RELEASE  The risks benefits and alternatives were discussed with the patient including but not limited to the risks of nonoperative treatment, versus surgical intervention including infection, bleeding, nerve injury,  blood clots, cardiopulmonary complications, morbidity, mortality, among others, and they were willing to proceed.   Ozell Cummins, MD 03/02/2024 1:23 PM

## 2024-03-03 ENCOUNTER — Encounter (HOSPITAL_BASED_OUTPATIENT_CLINIC_OR_DEPARTMENT_OTHER): Payer: Self-pay | Admitting: Orthopaedic Surgery

## 2024-03-03 NOTE — Anesthesia Postprocedure Evaluation (Signed)
 Anesthesia Post Note  Patient: Arionna Hoggard  Procedure(s) Performed: CARPAL TUNNEL RELEASE (Left: Hand)     Patient location during evaluation: PACU Anesthesia Type: MAC Level of consciousness: awake and alert Pain management: pain level controlled Vital Signs Assessment: post-procedure vital signs reviewed and stable Respiratory status: spontaneous breathing, nonlabored ventilation, respiratory function stable and patient connected to nasal cannula oxygen Cardiovascular status: stable and blood pressure returned to baseline Postop Assessment: no apparent nausea or vomiting Anesthetic complications: no   No notable events documented.  Last Vitals:  Vitals:   03/02/24 1620 03/02/24 1700  BP: (!) 166/92 (!) 160/83  Pulse: 60 69  Resp: 14 16  Temp:  (!) 36.3 C  SpO2: 94% 99%    Last Pain:  Vitals:   03/02/24 1700  PainSc: 0-No pain                 Epifanio Lamar BRAVO

## 2024-03-07 ENCOUNTER — Encounter: Payer: Self-pay | Admitting: Physical Therapy

## 2024-03-07 ENCOUNTER — Ambulatory Visit: Admitting: Physical Therapy

## 2024-03-07 DIAGNOSIS — M25562 Pain in left knee: Secondary | ICD-10-CM | POA: Diagnosis not present

## 2024-03-07 DIAGNOSIS — M5459 Other low back pain: Secondary | ICD-10-CM

## 2024-03-07 DIAGNOSIS — M25561 Pain in right knee: Secondary | ICD-10-CM

## 2024-03-07 DIAGNOSIS — M6281 Muscle weakness (generalized): Secondary | ICD-10-CM

## 2024-03-07 NOTE — Therapy (Signed)
 OUTPATIENT PHYSICAL THERAPY NOTE  Patient Name: Julia Dunn MRN: [id number] DOB:[birth year], 53 y.o., female Today's Date: 03/07/2024   PT End of Session - 03/07/24 0715     Visit Number 2    Number of Visits --   1-2x/week   Date for Recertification  04/25/24    Authorization Type [employer]    PT Start Time 0715    PT Stop Time 0756    PT Time Calculation (min) 41 min          Past Medical History:  Diagnosis Date   Anemia Dx 2013   Asthma    Bacterial vaginosis 2008   Complication of anesthesia    pt states was informed had difficult time awakening following hysterectomy    Depression    Depression    Diabetes mellitus Dx 2005   Fibroids 09/05/2011   08/08/2011: Anteverted uterus. Diffuse fibroid involvement. Left of midline fibroid with submucosal component. 3.4cm x 2.8cmx 3.4cm.    GERD (gastroesophageal reflux disease)    Gestational diabetes    H/O shortness of breath    pt states has improved since lossing weight; triggers currently are enviromental pt states can make it to top of stairs w/o having to stop to catch her breath   Headache(784.0)    migraines   History of blood transfusion 2013/2014   History of bronchitis    Hx of blood clots    Hypertension Dx 2015   Kidney infection 2015   Menorrhagia 09/27/2010   Pneumonia    Postpartum depression    Pregnancy induced hypertension    Sleep apnea    mild, no CPAP recommended   Trichomonas    Past Surgical History:  Procedure Laterality Date   ABDOMINAL HYSTERECTOMY N/A 11/03/2012   Procedure: HYSTERECTOMY ABDOMINAL;  Surgeon: Rome Rigg, MD;  Location: [facility];  Service: Gynecology;  Laterality: N/A;   BILATERAL SALPINGECTOMY Bilateral 11/03/2012   Procedure: BILATERAL SALPINGECTOMY;  Surgeon: Rome Rigg, MD;  Location: [facility];  Service: Gynecology;  Laterality: Bilateral;   BURCH PROCEDURE N/A 11/03/2012   Procedure: BURCH PROCEDURE;  Surgeon: Rome Rigg, MD;  Location: [facility];  Service:  Gynecology;  Laterality: N/A;   CARPAL TUNNEL RELEASE Right 11/20/2021   Procedure: RIGHT CARPAL TUNNEL RELEASE;  Surgeon: Jerri Kay HERO, MD;  Location: [facility];  Service: Orthopedics;  Laterality: Right;   CARPAL TUNNEL RELEASE Left 03/02/2024   Procedure: CARPAL TUNNEL RELEASE;  Surgeon: Jerri Kay HERO, MD;  Location: [facility];  Service: Orthopedics;  Laterality: Left;   CYSTOSCOPY N/A 11/03/2012   Procedure: CYSTOSCOPY;  Surgeon: Rome Rigg, MD;  Location: [facility];  Service: Gynecology;  Laterality: N/A;   DILATION AND CURETTAGE OF UTERUS     LAPAROSCOPY N/A 11/03/2012   Procedure: LAPAROSCOPY DIAGNOSTIC;  Surgeon: Rome Rigg, MD;  Location: [facility];  Service: Gynecology;  Laterality: N/A;   ORIF ANKLE FRACTURE Left 01/04/2016   Procedure: OPEN REDUCTION INTERNAL FIXATION (ORIF) LEFT BIMALLEOLAR ANKLE FRACTURE;  Surgeon: Lonni CINDERELLA Poli, MD;  Location: [facility];  Service: Orthopedics;  Laterality: Left;   TUBAL LIGATION     WISDOM TOOTH EXTRACTION     3 teeth   Patient Active Problem List   Diagnosis Date Noted   Chronic pain of left knee 01/26/2024   Depression, major, recurrent, moderate (HCC) 06/13/2022   Bilateral hand pain 08/09/2021   Carpal tunnel syndrome on left 08/09/2021   Acute on chronic diastolic congestive heart failure (HCC) 08/03/2021  Closed displaced bimalleolar fracture of left ankle 01/04/2016   Abnormal EKG 06/22/2014   Abnormal CT scan of lung 06/22/2014   Dyspnea 06/19/2014   Microscopic hematuria 04/07/2014   Onychomycosis 05/31/2013   BI-RADS category 3 mammogram result 03/07/2013   Vitamin D  insufficiency 08/13/2011   Morbid obesity (HCC) 08/10/2007   ALLERGIC RHINITIS, SEASONAL 07/14/2006   DM2 (diabetes mellitus, type 2) 05/28/2006   DEPRESSIVE DISORDER, NOS 05/28/2006   HTN (hypertension) 05/28/2006    PCP: [name], MD  REFERRING PROVIDER: Jerri Kay HERO, MD  THERAPY DIAG:  Left knee pain,  unspecified chronicity  Right knee pain, unspecified chronicity  Other low back pain  Muscle weakness  REFERRING DIAG: Chronic pain of left knee [M25.562, G89.29]   Rationale for Evaluation and Treatment:  Rehabilitation  SUBJECTIVE:  PERTINENT PAST HISTORY:  DM TII, Obesity, L ankle fracture, depression, HTN, asthma      PRECAUTIONS: Fall  WEIGHT BEARING RESTRICTIONS No  FALLS:  Has patient fallen in last 6 months? Yes, Number of falls: 2 falls attributes it to clumsiness and weakness  MOI/History of condition:  Onset date: 3 months  SUBJECTIVE STATEMENT   03/07/2024:  Pt had recent CT release.  She feel like her knee may collapse medially.    EVAL:  Pt is a 53 y.o. female who presents to clinic with chief complaint of L>R knee pain which which has been going on for about 3 months.  Feel her leg and knee are weak.  Had medial knee pain which prevented her from bending her knee and going up and down steps.  She had sudden onset of swelling with no trauma.  Describes a feeling of pressure release with sudden swelling at start of pain.  Does have history of low back pain with some intermittent LE sxs.     Red flags:  denies   Pain:  Are you having pain? Yes Pain location: Medial L knee pain, some lateral R knee ain NPRS scale:  Best: 3/10, Worst: 7/10 Aggravating factors: stairs, squatting Relieving factors: rest Pain description: sharp, dull, and aching  Occupation: cleans houses  Assistive Device: na  Hand Dominance: na  Patient Goals/Specific Activities: reduce pain   OBJECTIVE:   DIAGNOSTIC FINDINGS:  X-ray shows small effusion   GENERAL OBSERVATION/GAIT: Antalgic gait, reduced time in stance on L   SENSATION: Light touch: Appears intact  PALPATION: TTP medial L knee  MUSCLE LENGTH: Hamstrings: Right no restriction; Left no restriction   LE MMT:  MMT Right (Eval) Left (Eval)  Hip flexion (L2, L3)    Knee extension (L3) 4 3  Knee  flexion 4 3  Hip abduction    Hip extension    Hip external rotation    Hip internal rotation    Hip adduction    Ankle dorsiflexion (L4)    Ankle plantarflexion (S1)    Ankle inversion    Ankle eversion    Great Toe ext (L5)    Grossly     (Blank rows = not tested, score listed is out of 5 possible points OR may be listed in lbs of force.  N = WNL, D = diminished, C = clear for gross weakness with myotome testing, * = concordant pain with testing)  LE ROM:  ROM Right (Eval) Left (Eval)  Hip flexion    Hip extension    Hip abduction    Hip adduction    Hip internal rotation    Hip external rotation  Knee extension n n  Knee flexion n n  Ankle dorsiflexion    Ankle plantarflexion    Ankle inversion    Ankle eversion     (Blank rows = not tested, N = WNL, * = concordant pain with testing)  Functional Tests  Eval                                                              SPECIAL TESTS:  Slump and SLR (-)   PATIENT SURVEYS:  LEFS: 32/80  HOME EXERCISE PROGRAM: Access Code: [email or web address] URL: [email or web address] Date: 03/07/2024 Prepared by: Helene Gasmen  Exercises - Supine Quad Set  - 3 x daily - 7 x weekly - 2 sets - 10 reps - 5 second hold - Active Straight Leg Raise with Quad Set  - 1 x daily - 7 x weekly - 2 sets - 10 reps - Supine Lower Trunk Rotation  - 1-2 x daily - 7 x weekly - 1 sets - 20 reps - 3 hold - Seated Long Arc Quad  - 2 x daily - 7 x weekly - 2 sets - 30 reps  Treatment priorities   Eval        Quad activation/strengthening         Look at low back PRN d/t L LE weakness                                 TODAY'S TREATMENT:  [facility] Adult PT Treatment  03/07/2024:  Therapeutic Exercise: nu-step L5 60m while taking subjective and planning session with patient Quad set - 2x10x5s SLR - 2x10 Bridge on ball - 2x10 LAQ - 2x30    ASSESSMENT:  CLINICAL IMPRESSION:  03/07/2024: Lauraine tolerated session  well with no adverse reaction.  Reviewed HEP and encouraged regular completion.  Added high rep low load knee ext to HEP.  No increase in pain following visit.  EVAL:  Aleenah is a 53 y.o. female who presents to clinic with signs and sxs consistent with L knee pain.  No specific trauma but pt does have point tenderness of MCL L knee.  X-ray (-) for significant OA.  Significant weakness about the L knee with no pain on testing today; may consider low back origin if there is no benefit from local strengthening.  Massiah will benefit from skilled PT to address relevant deficits and improve comfort with daily tasks.   OBJECTIVE IMPAIRMENTS: Pain, L knee strength, gait, balance  ACTIVITY LIMITATIONS: walking, standing, squatting, stairs, housework, work, driving  PERSONAL FACTORS: See medical history and pertinent history   REHAB POTENTIAL: Good  CLINICAL DECISION MAKING: Evolving/moderate complexity  EVALUATION COMPLEXITY: Moderate   GOALS:   SHORT TERM GOALS: Target date: 03/28/2024   [place] will be >75% HEP compliant to improve carryover between sessions and facilitate independent management of condition  Evaluation: ongoing Goal status: Ongoing   LONG TERM GOALS: Target date: 04/25/2024   Ameria will self report >/= 50% decrease in pain from evaluation to improve function in daily tasks  Evaluation/Baseline: 7/10 max pain Goal status: INITIAL   2.  Kanylah will show a >/= 18 pt improvement in LEFS score (MCID is ~11% or 9 pts) as a proxy for  functional improvement   Evaluation/Baseline: 32 pts Goal status: INITIAL   3.  Michalle will be able to go up and down 10 stairs, not limited by pain  Evaluation/Baseline: limited Goal status: INITIAL   4.  Gurbani will improve the following MMTs to >/= 4/5 to show improvement in strength:  L knee   Evaluation/Baseline: see chart in note Goal status: INITIAL    PLAN: PT FREQUENCY: 1-2x/week  PT DURATION: 8 weeks  PLANNED  INTERVENTIONS:  [id number]- PT Re-evaluation, 97110-Therapeutic exercises, 97530- Therapeutic activity, [id number]- Neuromuscular re-education, 97535- Self Care, [id number]- Manual therapy, [id number]- Gait training, [id number]- Aquatic Therapy, [phone]- Electrical stimulation (manual), [id number]- Vasopneumatic device, [id number]- Traction (mechanical), [id number]- Ionotophoresis 4mg /ml Dexamethasone , Taping, Dry Needling, Joint manipulation, and Spinal manipulation.   Aaryn Sermon E Gaylia Kassel PT 03/07/2024, 7:57 AM

## 2024-03-08 ENCOUNTER — Ambulatory Visit (INDEPENDENT_AMBULATORY_CARE_PROVIDER_SITE_OTHER): Admitting: Otolaryngology

## 2024-03-08 ENCOUNTER — Encounter (INDEPENDENT_AMBULATORY_CARE_PROVIDER_SITE_OTHER): Payer: Self-pay | Admitting: Otolaryngology

## 2024-03-08 VITALS — BP 168/99 | HR 98 | Wt 238.0 lb

## 2024-03-08 DIAGNOSIS — G500 Trigeminal neuralgia: Secondary | ICD-10-CM

## 2024-03-08 DIAGNOSIS — G501 Atypical facial pain: Secondary | ICD-10-CM | POA: Diagnosis not present

## 2024-03-08 NOTE — Progress Notes (Signed)
 Reason for Consult: Follow-up Referring Physician: Dr. Vicci Domino Julia Dunn is an 53 y.o. female.  HPI: She is follow-up for her sinus and tinnitus issues.  Her MRI scan did not show any evidence of mass or sinusitis.  She still has the nasal symptoms with some right sided facial pain.  Past Medical History:  Diagnosis Date   Anemia Dx 2013   Asthma    Bacterial vaginosis 2008   Complication of anesthesia    pt states was informed had difficult time awakening following hysterectomy    Depression    Depression    Diabetes mellitus Dx 2005   Fibroids 09/05/2011   08/08/2011: Anteverted uterus. Diffuse fibroid involvement. Left of midline fibroid with submucosal component. 3.4cm x 2.8cmx 3.4cm.    GERD (gastroesophageal reflux disease)    Gestational diabetes    H/O shortness of breath    pt states has improved since lossing weight; triggers currently are enviromental pt states can make it to top of stairs w/o having to stop to catch her breath   Headache(784.0)    migraines   History of blood transfusion 2013/2014   History of bronchitis    Hx of blood clots    Hypertension Dx 2015   Kidney infection 2015   Menorrhagia 09/27/2010   Pneumonia    Postpartum depression    Pregnancy induced hypertension    Sleep apnea    mild, no CPAP recommended   Trichomonas     Past Surgical History:  Procedure Laterality Date   ABDOMINAL HYSTERECTOMY N/A 11/03/2012   Procedure: HYSTERECTOMY ABDOMINAL;  Surgeon: Rome Rigg, MD;  Location: [facility];  Service: Gynecology;  Laterality: N/A;   BILATERAL SALPINGECTOMY Bilateral 11/03/2012   Procedure: BILATERAL SALPINGECTOMY;  Surgeon: Rome Rigg, MD;  Location: [facility];  Service: Gynecology;  Laterality: Bilateral;   BURCH PROCEDURE N/A 11/03/2012   Procedure: BURCH PROCEDURE;  Surgeon: Rome Rigg, MD;  Location: [facility];  Service: Gynecology;  Laterality: N/A;   CARPAL TUNNEL RELEASE Right 11/20/2021   Procedure: RIGHT CARPAL TUNNEL  RELEASE;  Surgeon: Jerri Kay HERO, MD;  Location: [facility];  Service: Orthopedics;  Laterality: Right;   CARPAL TUNNEL RELEASE Left 03/02/2024   Procedure: CARPAL TUNNEL RELEASE;  Surgeon: Jerri Kay HERO, MD;  Location: [facility];  Service: Orthopedics;  Laterality: Left;   CYSTOSCOPY N/A 11/03/2012   Procedure: CYSTOSCOPY;  Surgeon: Rome Rigg, MD;  Location: [facility];  Service: Gynecology;  Laterality: N/A;   DILATION AND CURETTAGE OF UTERUS     LAPAROSCOPY N/A 11/03/2012   Procedure: LAPAROSCOPY DIAGNOSTIC;  Surgeon: Rome Rigg, MD;  Location: [facility];  Service: Gynecology;  Laterality: N/A;   ORIF ANKLE FRACTURE Left 01/04/2016   Procedure: OPEN REDUCTION INTERNAL FIXATION (ORIF) LEFT BIMALLEOLAR ANKLE FRACTURE;  Surgeon: Lonni CINDERELLA Poli, MD;  Location: [facility];  Service: Orthopedics;  Laterality: Left;   TUBAL LIGATION     WISDOM TOOTH EXTRACTION     3 teeth    Family History  Problem Relation Age of Onset   Heart disease Mother    Rheumatic fever Mother    Hypertension Mother    Stroke Mother    CAD Mother    Hypertension Father    Hypertension Brother    Stroke Brother    Sickle cell trait Daughter    Heart disease Paternal Aunt    Breast cancer Paternal Aunt 26 - 38   Breast cancer Paternal Grandmother 57 - 26  Social History:  reports that she has never smoked. She has never used smokeless tobacco. She reports current alcohol use. She reports that she does not use drugs.  Allergies:  Allergies  Allergen Reactions   Metformin  And Related Other (See Comments)    GI upset even with low dose.    Medications: I have reviewed the patient's current medications.  No results found for this or any previous visit (from the past 48 hours).  No results found.  ROS Blood pressure (!) 168/99, pulse 98, weight 238 lb (108 kg), last menstrual period 06/29/2012, SpO2 96%. Physical Exam Constitutional:      Appearance: Normal appearance.   HENT:     Head: Normocephalic and atraumatic.     Right Ear: Tympanic membrane is without lesions and middle ear aerated, ear canal and external ear normal.     Left Ear: Tympanic membrane is without lesions and middle ear aerated, ear canal and external ear normal.     Nose: Nose normal. Turbinates with mild hypertrophy, No significant swelling or masses.     Oral cavity/oropharynx: Mucous membranes are moist. No lesions or masses    Larynx: normal voice. Mirror attempted without success    Eyes:     Extraocular Movements: Extraocular movements intact.     Conjunctiva/sclera: Conjunctivae normal.     Pupils: Pupils are equal, round, and reactive to light.  Cardiovascular:     Rate and Rhythm: Normal rate.  Pulmonary:     Effort: Pulmonary effort is normal.  Musculoskeletal:     Cervical back: Normal range of motion and neck supple. No rigidity.  Lymphadenopathy:     Cervical: No cervical adenopathy or masses.salivary glands without lesions. .  Neurological:     Mental Status: He is alert. CN 2-12 intact. No nystagmus   I reviewed her MRI scan   Assessment/Plan: Facial pain-right now her MRI scan did not show any mass or evidence of sinusitis.  Her facial pain and pressure seems like it is more likely a neuralgia and I would recommend a neurology evaluation.  She agrees and will pursue that and follow-up with me if there is further symptoms.  She should have another audiogram in a year or sooner if any new ear symptoms.  Julia Dunn 03/08/2024, 8:30 PM

## 2024-03-09 ENCOUNTER — Ambulatory Visit: Admitting: Orthopaedic Surgery

## 2024-03-09 DIAGNOSIS — G5602 Carpal tunnel syndrome, left upper limb: Secondary | ICD-10-CM

## 2024-03-09 NOTE — Progress Notes (Signed)
 Post-Op Visit Note   Patient: Julia Dunn           Date of Birth: 1970/07/29           MRN: [id number] Visit Date: 03/09/2024 PCP: [name], MD   Assessment & Plan:  Chief Complaint:  Chief Complaint  Patient presents with   Left Wrist - Follow-up    Carpal tunnel release 03/02/2024   Visit Diagnoses:  1. Left carpal tunnel syndrome     Plan: History of Present Illness She is a 53 year old female who presents for 1 week postop check.  She is one week post-operative and reports mild to significant improvement in her symptoms since surgery. Her sleep is disrupted, though she is unsure if this is due to the surgery or other factors.  Physical Exam SKIN: Incision site intact.  No drainage  Assessment and Plan Post-operative management of left carpal tunnel syndrome One week post-op from left carpal tunnel release. Incision looks good. Symptom improvement noted. - Cover incision with Band-Aid daily. - Avoid soaking incision; showering allowed. - Follow-up in one week for suture removal.  Follow-Up Instructions: Return in about 1 week (around 03/16/2024) for with lindsey.   Orders:  No orders of the defined types were placed in this encounter.  No orders of the defined types were placed in this encounter.   Imaging: No results found.  PMFS History: Patient Active Problem List   Diagnosis Date Noted   Chronic pain of left knee 01/26/2024   Depression, major, recurrent, moderate (HCC) 06/13/2022   Bilateral hand pain 08/09/2021   Left carpal tunnel syndrome 08/09/2021   Acute on chronic diastolic congestive heart failure (HCC) 08/03/2021   Closed displaced bimalleolar fracture of left ankle 01/04/2016   Abnormal EKG 06/22/2014   Abnormal CT scan of lung 06/22/2014   Dyspnea 06/19/2014   Microscopic hematuria 04/07/2014   Onychomycosis 05/31/2013   BI-RADS category 3 mammogram result 03/07/2013   Vitamin D  insufficiency 08/13/2011   Morbid obesity  (HCC) 08/10/2007   ALLERGIC RHINITIS, SEASONAL 07/14/2006   DM2 (diabetes mellitus, type 2) 05/28/2006   DEPRESSIVE DISORDER, NOS 05/28/2006   HTN (hypertension) 05/28/2006   Past Medical History:  Diagnosis Date   Anemia Dx 2013   Asthma    Bacterial vaginosis 2008   Complication of anesthesia    pt states was informed had difficult time awakening following hysterectomy    Depression    Depression    Diabetes mellitus Dx 2005   Fibroids 09/05/2011   08/08/2011: Anteverted uterus. Diffuse fibroid involvement. Left of midline fibroid with submucosal component. 3.4cm x 2.8cmx 3.4cm.    GERD (gastroesophageal reflux disease)    Gestational diabetes    H/O shortness of breath    pt states has improved since lossing weight; triggers currently are enviromental pt states can make it to top of stairs w/o having to stop to catch her breath   Headache(784.0)    migraines   History of blood transfusion 2013/2014   History of bronchitis    Hx of blood clots    Hypertension Dx 2015   Kidney infection 2015   Menorrhagia 09/27/2010   Pneumonia    Postpartum depression    Pregnancy induced hypertension    Sleep apnea    mild, no CPAP recommended   Trichomonas     Family History  Problem Relation Age of Onset   Heart disease Mother    Rheumatic fever Mother    Hypertension Mother  Stroke Mother    CAD Mother    Hypertension Father    Hypertension Brother    Stroke Brother    Sickle cell trait Daughter    Heart disease Paternal Aunt    Breast cancer Paternal Aunt 58 - 61   Breast cancer Paternal Grandmother 63 - 64    Past Surgical History:  Procedure Laterality Date   ABDOMINAL HYSTERECTOMY N/A 11/03/2012   Procedure: HYSTERECTOMY ABDOMINAL;  Surgeon: Rome Rigg, MD;  Location: [facility];  Service: Gynecology;  Laterality: N/A;   BILATERAL SALPINGECTOMY Bilateral 11/03/2012   Procedure: BILATERAL SALPINGECTOMY;  Surgeon: Rome Rigg, MD;  [facility];  Service:  Gynecology;  Laterality: Bilateral;   BURCH PROCEDURE N/A 11/03/2012   Procedure: BURCH PROCEDURE;  Surgeon: Rome Rigg, MD;  Location[facility];  Service: Gynecology;  Laterality: N/A;   CARPAL TUNNEL RELEASE Right 11/20/2021   Procedure: RIGHT CARPAL TUNNEL RELEASE;  Surgeon: Jerri Kay HERO, MD;  Location: [facility];  Service: Orthopedics;  Laterality: Right;   CARPAL TUNNEL RELEASE Left 03/02/2024   Procedure: CARPAL TUNNEL RELEASE;  Surgeon: Jerri Kay HERO, MD;  Location: [facility];  Service: Orthopedics;  Laterality: Left;   CYSTOSCOPY N/A 11/03/2012   Procedure: CYSTOSCOPY;  Surgeon: Rome Rigg, MD;  [facility];  Service: Gynecology;  Laterality: N/A;   DILATION AND CURETTAGE OF UTERUS     LAPAROSCOPY N/A 11/03/2012   Procedure: LAPAROSCOPY DIAGNOSTIC;  Surgeon: Rome Rigg, MD;  [facility];  Service: Gynecology;  Laterality: N/A;   ORIF ANKLE FRACTURE Left 01/04/2016   Procedure: OPEN REDUCTION INTERNAL FIXATION (ORIF) LEFT BIMALLEOLAR ANKLE FRACTURE;  Surgeon: Lonni CINDERELLA Poli, MD;  Location: [facility];  Service: Orthopedics;  Laterality: Left;   TUBAL LIGATION     WISDOM TOOTH EXTRACTION     3 teeth   Social History   Occupational History   Occupation: Unemployed  Tobacco Use   Smoking status: Never   Smokeless tobacco: Never  Vaping Use   Vaping status: Never Used  Substance and Sexual Activity   Alcohol use: Yes    Comment: social   Drug use: No   Sexual activity: Yes    Birth control/protection: Surgical    Comment: hysterectomy

## 2024-03-11 ENCOUNTER — Other Ambulatory Visit: Payer: Self-pay | Admitting: Physician Assistant

## 2024-03-11 ENCOUNTER — Other Ambulatory Visit: Payer: Self-pay

## 2024-03-11 ENCOUNTER — Other Ambulatory Visit: Payer: Self-pay | Admitting: Internal Medicine

## 2024-03-11 DIAGNOSIS — E1159 Type 2 diabetes mellitus with other circulatory complications: Secondary | ICD-10-CM

## 2024-03-11 MED ORDER — AMLODIPINE BESYLATE 10 MG PO TABS
10.0000 mg | ORAL_TABLET | Freq: Every day | ORAL | 1 refills | Status: AC
  Filled 2024-03-11: qty 90, 90d supply, fill #0

## 2024-03-14 ENCOUNTER — Other Ambulatory Visit: Payer: Self-pay

## 2024-03-14 NOTE — Therapy (Signed)
 OUTPATIENT PHYSICAL THERAPY NOTE  Patient Name: Julia Dunn MRN: [id number] DOB:07/18/1970, 53 y.o., female Today's Date: 03/15/2024   PT End of Session - 03/15/24 0800     Visit Number 3    Number of Visits --   1-2x per week   Date for Recertification  04/25/24    Authorization Type [employer]    PT Start Time 0720    PT Stop Time 0800    PT Time Calculation (min) 40 min    Activity Tolerance Patient tolerated treatment well    Behavior During Therapy [facility] for tasks assessed/performed           Past Medical History:  Diagnosis Date   Anemia Dx 2013   Asthma    Bacterial vaginosis 2008   Complication of anesthesia    pt states was informed had difficult time awakening following hysterectomy    Depression    Depression    Diabetes mellitus Dx 2005   Fibroids 09/05/2011   08/08/2011: Anteverted uterus. Diffuse fibroid involvement. Left of midline fibroid with submucosal component. 3.4cm x 2.8cmx 3.4cm.    GERD (gastroesophageal reflux disease)    Gestational diabetes    H/O shortness of breath    pt states has improved since lossing weight; triggers currently are enviromental pt states can make it to top of stairs w/o having to stop to catch her breath   Headache(784.0)    migraines   History of blood transfusion 2013/2014   History of bronchitis    Hx of blood clots    Hypertension Dx 2015   Kidney infection 2015   Menorrhagia 09/27/2010   Pneumonia    Postpartum depression    Pregnancy induced hypertension    Sleep apnea    mild, no CPAP recommended   Trichomonas    Past Surgical History:  Procedure Laterality Date   ABDOMINAL HYSTERECTOMY N/A 11/03/2012   Procedure: HYSTERECTOMY ABDOMINAL;  Surgeon: Rome Rigg, MD;  Location: [facility];  Service: Gynecology;  Laterality: N/A;   BILATERAL SALPINGECTOMY Bilateral 11/03/2012   Procedure: BILATERAL SALPINGECTOMY;  Surgeon: Rome Rigg, MD;  Location[facility];  Service: Gynecology;  Laterality: Bilateral;    BURCH PROCEDURE N/A 11/03/2012   Procedure: BURCH PROCEDURE;  Surgeon: Rome Rigg, MD;  Location[facility];  Service: Gynecology;  Laterality: N/A;   CARPAL TUNNEL RELEASE Right 11/20/2021   Procedure: RIGHT CARPAL TUNNEL RELEASE;  Surgeon: Jerri Kay HERO, MD;  Location: [facility];  Service: Orthopedics;  Laterality: Right;   CARPAL TUNNEL RELEASE Left 03/02/2024   Procedure: CARPAL TUNNEL RELEASE;  Surgeon: Jerri Kay HERO, MD;  Location: [facility];  Service: Orthopedics;  Laterality: Left;   CYSTOSCOPY N/A 11/03/2012   Procedure: CYSTOSCOPY;  Surgeon: Rome Rigg, MD;  Location[facility];  Service: Gynecology;  Laterality: N/A;   DILATION AND CURETTAGE OF UTERUS     LAPAROSCOPY N/A 11/03/2012   Procedure: LAPAROSCOPY DIAGNOSTIC;  Surgeon: Rome Rigg, MD;  [facility];  Service: Gynecology;  Laterality: N/A;   ORIF ANKLE FRACTURE Left 01/04/2016   Procedure: OPEN REDUCTION INTERNAL FIXATION (ORIF) LEFT BIMALLEOLAR ANKLE FRACTURE;  Surgeon: Lonni CINDERELLA Poli, MD;  Location: [facility];  Service: Orthopedics;  Laterality: Left;   TUBAL LIGATION     WISDOM TOOTH EXTRACTION     3 teeth   Patient Active Problem List   Diagnosis Date Noted   Chronic pain of left knee 01/26/2024   Depression, major, recurrent, moderate (HCC) 06/13/2022   Bilateral hand  pain 08/09/2021   Left carpal tunnel syndrome 08/09/2021   Acute on chronic diastolic congestive heart failure (HCC) 08/03/2021   Closed displaced bimalleolar fracture of left ankle 01/04/2016   Abnormal EKG 06/22/2014   Abnormal CT scan of lung 06/22/2014   Dyspnea 06/19/2014   Microscopic hematuria 04/07/2014   Onychomycosis 05/31/2013   BI-RADS category 3 mammogram result 03/07/2013   Vitamin D  insufficiency 08/13/2011   Morbid obesity (HCC) 08/10/2007   ALLERGIC RHINITIS, SEASONAL 07/14/2006   DM2 (diabetes mellitus, type 2) 05/28/2006   DEPRESSIVE DISORDER, NOS 05/28/2006   HTN (hypertension)  05/28/2006    PCP: [name], MD  REFERRING PROVIDER: Jerri Kay HERO, MD  THERAPY DIAG:  Left knee pain, unspecified chronicity  Muscle weakness  REFERRING DIAG: Chronic pain of left knee [M25.562, G89.29]   Rationale for Evaluation and Treatment:  Rehabilitation  SUBJECTIVE:  PERTINENT PAST HISTORY:  DM TII, Obesity, L ankle fracture, depression, HTN, asthma      PRECAUTIONS: Fall  WEIGHT BEARING RESTRICTIONS No  FALLS:  Has patient fallen in last 6 months? Yes, Number of falls: 2 falls attributes it to clumsiness and weakness  MOI/History of condition:  Onset date: 3 months  SUBJECTIVE STATEMENT 03/15/24:  Pt reports her L knee is generally feeling some better.  EVAL:  Pt is a 53 y.o. female who presents to clinic with chief complaint of L>R knee pain which which has been going on for about 3 months.  Feel her leg and knee are weak.  Had medial knee pain which prevented her from bending her knee and going up and down steps.  She had sudden onset of swelling with no trauma.  Describes a feeling of pressure release with sudden swelling at start of pain.  Does have history of low back pain with some intermittent LE sxs.     Red flags:  denies   Pain:  Are you having pain? Yes Pain location: Medial L knee pain, some lateral R knee ain NPRS scale:  Best: 3/10, Worst: 7/10; Current: 5/10 Aggravating factors: stairs, squatting Relieving factors: rest Pain description: sharp, dull, and aching  Occupation: cleans houses  Assistive Device: na  Hand Dominance: na  Patient Goals/Specific Activities: reduce pain   OBJECTIVE:   DIAGNOSTIC FINDINGS:  X-ray shows small effusion   GENERAL OBSERVATION/GAIT: Antalgic gait, reduced time in stance on L   SENSATION: Light touch: Appears intact  PALPATION: TTP medial L knee  MUSCLE LENGTH: Hamstrings: Right no restriction; Left no restriction   LE MMT:  MMT Right (Eval) Left (Eval)  Hip flexion (L2,  L3)    Knee extension (L3) 4 3  Knee flexion 4 3  Hip abduction    Hip extension    Hip external rotation    Hip internal rotation    Hip adduction    Ankle dorsiflexion (L4)    Ankle plantarflexion (S1)    Ankle inversion    Ankle eversion    Great Toe ext (L5)    Grossly     (Blank rows = not tested, score listed is out of 5 possible points OR may be listed in lbs of force.  N = WNL, D = diminished, C = clear for gross weakness with myotome testing, * = concordant pain with testing)  LE ROM:  ROM Right (Eval) Left (Eval)  Hip flexion    Hip extension    Hip abduction    Hip adduction    Hip internal rotation  Hip external rotation    Knee extension n n  Knee flexion n n  Ankle dorsiflexion    Ankle plantarflexion    Ankle inversion    Ankle eversion     (Blank rows = not tested, N = WNL, * = concordant pain with testing)  Functional Tests  Eval                                                              SPECIAL TESTS:  Slump and SLR (-)   PATIENT SURVEYS:  LEFS: 32/80  HOME EXERCISE PROGRAM: Access Code: [email or web address] URL: [email or web address] Date: 03/07/2024 Prepared by: Helene Gasmen  Exercises - Supine Quad Set  - 3 x daily - 7 x weekly - 2 sets - 10 reps - 5 second hold - Active Straight Leg Raise with Quad Set  - 1 x daily - 7 x weekly - 2 sets - 10 reps - Supine Lower Trunk Rotation  - 1-2 x daily - 7 x weekly - 1 sets - 20 reps - 3 hold - Seated Long Arc Quad  - 2 x daily - 7 x weekly - 2 sets - 30 reps  Treatment priorities   Eval        Quad activation/strengthening         Look at low back PRN d/t L LE weakness                                 TODAY'S TREATMENT: [facility] Adult PT Treatment:                                                DATE: 03/15/24 Therapeutic Exercise: nu-step L3 53m LE only while taking subjective and planning session with patient SLR c quad set - 2x10 3 Heel slide x10 3 Bridge on  ball - 2x10 LAQ - 2x30 Manual Therapy: STM massage to the quads c TPR  Self Care: Use of cold pack periodically x10-15 mins c elevation  Use of SPC to minimize pressure/pain with walking as needed  [facility] Adult PT Treatment  03/07/2024:  Therapeutic Exercise: nu-step L5 38m while taking subjective and planning session with patient Quad set - 2x10x5s SLR - 2x10 Bridge on ball - 2x10 LAQ - 2x30    ASSESSMENT:  CLINICAL IMPRESSION: 03/15/2024 PT provided massage c TPR to address taut muscle bands. Also, pt was instructed in the benefit of using cold packs and a SPC to minimize swelling and pain. Pt voiced understanding. Continued strengthening with focus on the quads. Pt notes pain has improved some since the start of PT. Pt will continue to benefit from skilled PT to address impairments for improved L knee/LE function c minimized pain.   EVAL:  Lenah is a 53 y.o. female who presents to clinic with signs and sxs consistent with L knee pain.  No specific trauma but pt does have point tenderness of MCL L knee.  X-ray (-) for significant OA.  Significant weakness about the L knee with no pain on testing today; may consider low back origin if there is  no benefit from local strengthening.  Ayame will benefit from skilled PT to address relevant deficits and improve comfort with daily tasks.   OBJECTIVE IMPAIRMENTS: Pain, L knee strength, gait, balance  ACTIVITY LIMITATIONS: walking, standing, squatting, stairs, housework, work, driving  PERSONAL FACTORS: See medical history and pertinent history   REHAB POTENTIAL: Good  CLINICAL DECISION MAKING: Evolving/moderate complexity  EVALUATION COMPLEXITY: Moderate   GOALS:   SHORT TERM GOALS: Target date: 03/28/2024   Nevin will be >75% HEP compliant to improve carryover between sessions and facilitate independent management of condition  Evaluation: ongoing Goal status: Ongoing   LONG TERM GOALS: Target date: 04/25/2024   Sharday will  self report >/= 50% decrease in pain from evaluation to improve function in daily tasks  Evaluation/Baseline: 7/10 max pain Goal status: INITIAL   2.  Kaiulani will show a >/= 18 pt improvement in LEFS score (MCID is ~11% or 9 pts) as a proxy for functional improvement   Evaluation/Baseline: 32 pts Goal status: INITIAL   3.  Keenan will be able to go up and down 10 stairs, not limited by pain  Evaluation/Baseline: limited Goal status: INITIAL   4.  Kelbie will improve the following MMTs to >/= 4/5 to show improvement in strength:  L knee   Evaluation/Baseline: see chart in note Goal status: INITIAL    PLAN: PT FREQUENCY: 1-2x/week  PT DURATION: 8 weeks  PLANNED INTERVENTIONS:  [id number]- PT Re-evaluation, 97110-Therapeutic exercises, 97530- Therapeutic activity, [id number]- Neuromuscular re-education, 97535- Self Care, [id number]- Manual therapy, [id number]- Gait training, [id number]- Aquatic Therapy, [id number]- Electrical stimulation (manual), [id number]- Vasopneumatic device, [id number]- Traction (mechanical), [id number]- Ionotophoresis 4mg /ml Dexamethasone , Taping, Dry Needling, Joint manipulation, and Spinal manipulation.   Davarius Ridener PT 03/15/2024, 8:04 AM

## 2024-03-15 ENCOUNTER — Ambulatory Visit

## 2024-03-15 ENCOUNTER — Ambulatory Visit: Admitting: Physician Assistant

## 2024-03-15 DIAGNOSIS — M25562 Pain in left knee: Secondary | ICD-10-CM

## 2024-03-15 DIAGNOSIS — G5602 Carpal tunnel syndrome, left upper limb: Secondary | ICD-10-CM

## 2024-03-15 DIAGNOSIS — M6281 Muscle weakness (generalized): Secondary | ICD-10-CM

## 2024-03-15 NOTE — Progress Notes (Signed)
 Post-Op Visit Note   Patient: Julia Dunn           Date of Birth: October 27, 1970           MRN: [id number] Visit Date: 03/15/2024 PCP: [name], MD   Assessment & Plan:  Chief Complaint:  Chief Complaint  Patient presents with   Left Wrist - Follow-up    Left carpal tunnel release 03/02/2024   Visit Diagnoses:  1. Left carpal tunnel syndrome     Plan: Patient is a pleasant 53 year old female who comes in today 2 weeks status post left carpal tunnel release 03/02/2024.  This was for severe carpal tunnel syndrome.  She has been doing well.  She still has decree sensation to the thumb and index fingertips.  Examination of her left hand reveals a well healed surgical incision with nylon sutures in place.  No evidence of infection or cellulitis.  Fingers are warm well-perfused.  Decree sensation to the tip of the thumb and index finger.  Today, her sutures were removed and Steri-Strips applied.  No heavy lifting or submerging her hand underwater for the next 2 weeks.  Follow-up in 2 weeks for recheck.  Call with concerns or questions.  Follow-Up Instructions: Return in about 2 weeks (around 03/29/2024).   Orders:  No orders of the defined types were placed in this encounter.  No orders of the defined types were placed in this encounter.   Imaging: No new imaging  PMFS History: Patient Active Problem List   Diagnosis Date Noted   Chronic pain of left knee 01/26/2024   Depression, major, recurrent, moderate (HCC) 06/13/2022   Bilateral hand pain 08/09/2021   Left carpal tunnel syndrome 08/09/2021   Acute on chronic diastolic congestive heart failure (HCC) 08/03/2021   Closed displaced bimalleolar fracture of left ankle 01/04/2016   Abnormal EKG 06/22/2014   Abnormal CT scan of lung 06/22/2014   Dyspnea 06/19/2014   Microscopic hematuria 04/07/2014   Onychomycosis 05/31/2013   BI-RADS category 3 mammogram result 03/07/2013   Vitamin D  insufficiency 08/13/2011    Morbid obesity (HCC) 08/10/2007   ALLERGIC RHINITIS, SEASONAL 07/14/2006   DM2 (diabetes mellitus, type 2) 05/28/2006   DEPRESSIVE DISORDER, NOS 05/28/2006   HTN (hypertension) 05/28/2006   Past Medical History:  Diagnosis Date   Anemia Dx 2013   Asthma    Bacterial vaginosis 2008   Complication of anesthesia    pt states was informed had difficult time awakening following hysterectomy    Depression    Depression    Diabetes mellitus Dx 2005   Fibroids 09/05/2011   08/08/2011: Anteverted uterus. Diffuse fibroid involvement. Left of midline fibroid with submucosal component. 3.4cm x 2.8cmx 3.4cm.    GERD (gastroesophageal reflux disease)    Gestational diabetes    H/O shortness of breath    pt states has improved since lossing weight; triggers currently are enviromental pt states can make it to top of stairs w/o having to stop to catch her breath   Headache(784.0)    migraines   History of blood transfusion 2013/2014   History of bronchitis    Hx of blood clots    Hypertension Dx 2015   Kidney infection 2015   Menorrhagia 09/27/2010   Pneumonia    Postpartum depression    Pregnancy induced hypertension    Sleep apnea    mild, no CPAP recommended   Trichomonas     Family History  Problem Relation Age of Onset   Heart disease  Mother    Rheumatic fever Mother    Hypertension Mother    Stroke Mother    CAD Mother    Hypertension Father    Hypertension Brother    Stroke Brother    Sickle cell trait Daughter    Heart disease Paternal Aunt    Breast cancer Paternal Aunt 80 - 78   Breast cancer Paternal Grandmother 69 - 4    Past Surgical History:  Procedure Laterality Date   ABDOMINAL HYSTERECTOMY N/A 11/03/2012   Procedure: HYSTERECTOMY ABDOMINAL;  Surgeon: Rome Rigg, MD;  Location: [facility];  Service: Gynecology;  Laterality: N/A;   BILATERAL SALPINGECTOMY Bilateral 11/03/2012   Procedure: BILATERAL SALPINGECTOMY;  Surgeon: Rome Rigg, MD;  Location: [facility];   Service: Gynecology;  Laterality: Bilateral;   BURCH PROCEDURE N/A 11/03/2012   Procedure: BURCH PROCEDURE;  Surgeon: Rome Rigg, MD;  Location: [facility];  Service: Gynecology;  Laterality: N/A;   CARPAL TUNNEL RELEASE Right 11/20/2021   Procedure: RIGHT CARPAL TUNNEL RELEASE;  Surgeon: Jerri Kay HERO, MD;  Location: [facility];  Service: Orthopedics;  Laterality: Right;   CARPAL TUNNEL RELEASE Left 03/02/2024   Procedure: CARPAL TUNNEL RELEASE;  Surgeon: Jerri Kay HERO, MD;  Location: [facility];  Service: Orthopedics;  Laterality: Left;   CYSTOSCOPY N/A 11/03/2012   Procedure: CYSTOSCOPY;  Surgeon: Rome Rigg, MD;  Location: [facility];  Service: Gynecology;  Laterality: N/A;   DILATION AND CURETTAGE OF UTERUS     LAPAROSCOPY N/A 11/03/2012   Procedure: LAPAROSCOPY DIAGNOSTIC;  Surgeon: Rome Rigg, MD;  Location: [facility];  Service: Gynecology;  Laterality: N/A;   ORIF ANKLE FRACTURE Left 01/04/2016   Procedure: OPEN REDUCTION INTERNAL FIXATION (ORIF) LEFT BIMALLEOLAR ANKLE FRACTURE;  Surgeon: Lonni CINDERELLA Poli, MD;  Location: [facility];  Service: Orthopedics;  Laterality: Left;   TUBAL LIGATION     WISDOM TOOTH EXTRACTION     3 teeth   Social History   Occupational History   Occupation: Unemployed  Tobacco Use   Smoking status: Never   Smokeless tobacco: Never  Vaping Use   Vaping status: Never Used  Substance and Sexual Activity   Alcohol use: Yes    Comment: social   Drug use: No   Sexual activity: Yes    Birth control/protection: Surgical    Comment: hysterectomy

## 2024-03-21 ENCOUNTER — Ambulatory Visit: Admitting: Physical Therapy

## 2024-03-23 ENCOUNTER — Other Ambulatory Visit: Payer: Self-pay

## 2024-03-25 ENCOUNTER — Other Ambulatory Visit: Payer: Self-pay

## 2024-03-28 ENCOUNTER — Other Ambulatory Visit: Payer: Self-pay

## 2024-03-28 NOTE — Therapy (Signed)
 " OUTPATIENT PHYSICAL THERAPY NOTE  Patient Name: Julia Dunn MRN: [id number] DOB:Dec 24, 1970, 53 y.o., female Today's Date: 03/29/2024   PT End of Session - 03/29/24 0727     Visit Number 4    Number of Visits --   1-2 x per week   Date for Recertification  04/25/24    Authorization Type [employer]    PT Start Time 9282    PT Stop Time 0800    PT Time Calculation (min) 43 min    Activity Tolerance Patient tolerated treatment well    Behavior During Therapy [facility] for tasks assessed/performed            Past Medical History:  Diagnosis Date   Anemia Dx 2013   Asthma    Bacterial vaginosis 2008   Complication of anesthesia    pt states was informed had difficult time awakening following hysterectomy    Depression    Depression    Diabetes mellitus Dx 2005   Fibroids 09/05/2011   08/08/2011: Anteverted uterus. Diffuse fibroid involvement. Left of midline fibroid with submucosal component. 3.4cm x 2.8cmx 3.4cm.    GERD (gastroesophageal reflux disease)    Gestational diabetes    H/O shortness of breath    pt states has improved since lossing weight; triggers currently are enviromental pt states can make it to top of stairs w/o having to stop to catch her breath   Headache(784.0)    migraines   History of blood transfusion 2013/2014   History of bronchitis    Hx of blood clots    Hypertension Dx 2015   Kidney infection 2015   Menorrhagia 09/27/2010   Pneumonia    Postpartum depression    Pregnancy induced hypertension    Sleep apnea    mild, no CPAP recommended   Trichomonas    Past Surgical History:  Procedure Laterality Date   ABDOMINAL HYSTERECTOMY N/A 11/03/2012   Procedure: HYSTERECTOMY ABDOMINAL;  Surgeon: Rome Rigg, MD;  Location: [facility];  Service: Gynecology;  Laterality: N/A;   BILATERAL SALPINGECTOMY Bilateral 11/03/2012   Procedure: BILATERAL SALPINGECTOMY;  Surgeon: Rome Rigg, MD;  Location: [facility];  Service: Gynecology;  Laterality: Bilateral;    BURCH PROCEDURE N/A 11/03/2012   Procedure: BURCH PROCEDURE;  Surgeon: Rome Rigg, MD;  Location: [facility];  Service: Gynecology;  Laterality: N/A;   CARPAL TUNNEL RELEASE Right 11/20/2021   Procedure: RIGHT CARPAL TUNNEL RELEASE;  Surgeon: Jerri Kay HERO, MD;  Location: [facility];  Service: Orthopedics;  Laterality: Right;   CARPAL TUNNEL RELEASE Left 03/02/2024   Procedure: CARPAL TUNNEL RELEASE;  Surgeon: Jerri Kay HERO, MD;  Location: [facility];  Service: Orthopedics;  Laterality: Left;   CYSTOSCOPY N/A 11/03/2012   Procedure: CYSTOSCOPY;  Surgeon: Rome Rigg, MD;  Location: [facility];  Service: Gynecology;  Laterality: N/A;   DILATION AND CURETTAGE OF UTERUS     LAPAROSCOPY N/A 11/03/2012   Procedure: LAPAROSCOPY DIAGNOSTIC;  Surgeon: Rome Rigg, MD;  Location: [facility];  Service: Gynecology;  Laterality: N/A;   ORIF ANKLE FRACTURE Left 01/04/2016   Procedure: OPEN REDUCTION INTERNAL FIXATION (ORIF) LEFT BIMALLEOLAR ANKLE FRACTURE;  Surgeon: Lonni CINDERELLA Poli, MD;  Location: [facility];  Service: Orthopedics;  Laterality: Left;   TUBAL LIGATION     WISDOM TOOTH EXTRACTION     3 teeth   Patient Active Problem List   Diagnosis Date Noted   Chronic pain of left knee 01/26/2024   Depression, major, recurrent, moderate (HCC) 06/13/2022  Bilateral hand pain 08/09/2021   Left carpal tunnel syndrome 08/09/2021   Acute on chronic diastolic congestive heart failure (HCC) 08/03/2021   Closed displaced bimalleolar fracture of left ankle 01/04/2016   Abnormal EKG 06/22/2014   Abnormal CT scan of lung 06/22/2014   Dyspnea 06/19/2014   Microscopic hematuria 04/07/2014   Onychomycosis 05/31/2013   BI-RADS category 3 mammogram result 03/07/2013   Vitamin D  insufficiency 08/13/2011   Morbid obesity (HCC) 08/10/2007   ALLERGIC RHINITIS, SEASONAL 07/14/2006   DM2 (diabetes mellitus, type 2) 05/28/2006   DEPRESSIVE DISORDER, NOS 05/28/2006   HTN (hypertension)  05/28/2006    PCP: [name], MD  REFERRING PROVIDER: Jerri Kay HERO, MD  THERAPY DIAG:  Left knee pain, unspecified chronicity  Muscle weakness  REFERRING DIAG: Chronic pain of left knee [M25.562, G89.29]   Rationale for Evaluation and Treatment:  Rehabilitation  SUBJECTIVE:  PERTINENT PAST HISTORY:  DM TII, Obesity, L ankle fracture, depression, HTN, asthma      PRECAUTIONS: Fall  WEIGHT BEARING RESTRICTIONS No  FALLS:  Has patient fallen in last 6 months? Yes, Number of falls: 2 falls attributes it to clumsiness and weakness  MOI/History of condition:  Onset date: 3 months  SUBJECTIVE STATEMENT 03/29/24:  Pt reports her L knee seems a little better.  EVAL:  Pt is a 53 y.o. female who presents to clinic with chief complaint of L>R knee pain which which has been going on for about 3 months.  Feel her leg and knee are weak.  Had medial knee pain which prevented her from bending her knee and going up and down steps.  She had sudden onset of swelling with no trauma.  Describes a feeling of pressure release with sudden swelling at start of pain.  Does have history of low back pain with some intermittent LE sxs.     Red flags:  denies   Pain:  Are you having pain? Yes Pain location: Medial L knee pain, some lateral R knee ain NPRS scale:  Best: 3/10, Worst: 7/10; Current: 6/10 Aggravating factors: stairs, squatting Relieving factors: rest Pain description: sharp, dull, and aching  Occupation: cleans houses  Assistive Device: na  Hand Dominance: na  Patient Goals/Specific Activities: reduce pain   OBJECTIVE:   DIAGNOSTIC FINDINGS:  X-ray shows small effusion   GENERAL OBSERVATION/GAIT: Antalgic gait, reduced time in stance on L   SENSATION: Light touch: Appears intact  PALPATION: TTP medial L knee  MUSCLE LENGTH: Hamstrings: Right no restriction; Left no restriction   LE MMT:  MMT Right (Eval) Left (Eval)  Hip flexion (L2, L3)     Knee extension (L3) 4 3  Knee flexion 4 3  Hip abduction    Hip extension    Hip external rotation    Hip internal rotation    Hip adduction    Ankle dorsiflexion (L4)    Ankle plantarflexion (S1)    Ankle inversion    Ankle eversion    Great Toe ext (L5)    Grossly     (Blank rows = not tested, score listed is out of 5 possible points OR may be listed in lbs of force.  N = WNL, D = diminished, C = clear for gross weakness with myotome testing, * = concordant pain with testing)  LE ROM:  ROM Right (Eval) Left (Eval)  Hip flexion    Hip extension    Hip abduction    Hip adduction    Hip internal rotation  Hip external rotation    Knee extension n n  Knee flexion n n  Ankle dorsiflexion    Ankle plantarflexion    Ankle inversion    Ankle eversion     (Blank rows = not tested, N = WNL, * = concordant pain with testing)  Functional Tests  Eval                                                              SPECIAL TESTS:  Slump and SLR (-)   PATIENT SURVEYS:  LEFS: 32/80  HOME EXERCISE PROGRAM: Access Code: [email or web address] URL: [email or web address] Date: 03/07/2024 Prepared by: Helene Gasmen  Exercises - Supine Quad Set  - 3 x daily - 7 x weekly - 2 sets - 10 reps - 5 second hold - Active Straight Leg Raise with Quad Set  - 1 x daily - 7 x weekly - 2 sets - 10 reps - Supine Lower Trunk Rotation  - 1-2 x daily - 7 x weekly - 1 sets - 20 reps - 3 hold - Seated Long Arc Quad  - 2 x daily - 7 x weekly - 2 sets - 30 reps  Treatment priorities   Eval        Quad activation/strengthening         Look at low back PRN d/t L LE weakness                                 TODAY'S TREATMENT: [facility] Adult PT Treatment:                                                DATE: 03/29/24 Therapeutic Exercise: nu-step L4 30m LE only while taking subjective and planning session with patient SLR c quad set - x15 3 Forward step ups 2x10 6, 10 s and  10 c KT tape, more comfortable c K-tape STS 2x10 s and c ball squeeze LAQ - 2x30 Manual Therapy: KT taping, 2 split I strips. One c sup and the other c inf anchor. Both c split strips med and lat to the patella   [facility] Adult PT Treatment:                                                DATE: 03/15/24 Therapeutic Exercise: nu-step L3 44m LE only while taking subjective and planning session with patient SLR c quad set - 2x10 3 Heel slide x10 3 Bridge on ball - 2x10 LAQ - 2x30 Manual Therapy: STM massage to the quads c TPR  Self Care: Use of cold pack periodically x10-15 mins c elevation  Use of SPC to minimize pressure/pain with walking as needed  [facility] Adult PT Treatment  03/07/2024:  Therapeutic Exercise: nu-step L5 79m while taking subjective and planning session with patient Quad set - 2x10x5s SLR - 2x10 Bridge on ball - 2x10 LAQ - 2x30    ASSESSMENT:  CLINICAL IMPRESSION: 03/29/2024: Provided K-taping to  the l knee as documented above. Pt reported her L knee felt better with the K-tape completing her prescribed exs for strengthening. Will assess pt's long term response to the K-taping her next PT session. Encouraged pt to complete her L knee/LE strengthening exs consistently and for the prescribed amounts.   EVAL:  Kilee is a 53 y.o. female who presents to clinic with signs and sxs consistent with L knee pain.  No specific trauma but pt does have point tenderness of MCL L knee.  X-ray (-) for significant OA.  Significant weakness about the L knee with no pain on testing today; may consider low back origin if there is no benefit from local strengthening.  Jameia will benefit from skilled PT to address relevant deficits and improve comfort with daily tasks.   OBJECTIVE IMPAIRMENTS: Pain, L knee strength, gait, balance  ACTIVITY LIMITATIONS: walking, standing, squatting, stairs, housework, work, driving  PERSONAL FACTORS: See medical history and pertinent history   REHAB  POTENTIAL: Good  CLINICAL DECISION MAKING: Evolving/moderate complexity  EVALUATION COMPLEXITY: Moderate   GOALS:   SHORT TERM GOALS: Target date: 03/28/2024   Keyosha will be >75% HEP compliant to improve carryover between sessions and facilitate independent management of condition  Evaluation: Ongoing Goal status: MET- Daily for only 10 mins, works daily   LONG TERM GOALS: Target date: 04/25/2024   Kelia will self report >/= 50% decrease in pain from evaluation to improve function in daily tasks  Evaluation/Baseline: 7/10 max pain Goal status: INITIAL   2.  Kazuko will show a >/= 18 pt improvement in LEFS score (MCID is ~11% or 9 pts) as a proxy for functional improvement   Evaluation/Baseline: 32 pts Goal status: INITIAL   3.  Smantha will be able to go up and down 10 stairs, not limited by pain  Evaluation/Baseline: limited Goal status: INITIAL   4.  Joselyn will improve the following MMTs to >/= 4/5 to show improvement in strength:  L knee   Evaluation/Baseline: see chart in note Goal status: INITIAL    PLAN: PT FREQUENCY: 1-2x/week  PT DURATION: 8 weeks  PLANNED INTERVENTIONS:  [id number]- PT Re-evaluation, 97110-Therapeutic exercises, 97530- Therapeutic activity, [id number]- Neuromuscular re-education, 97535- Self Care, [id number]- Manual therapy, [id number]- Gait training, [id number]- Aquatic Therapy, [id number]- Electrical stimulation (manual), [id number]- Vasopneumatic device, [id number]- Traction (mechanical), [id number]- Ionotophoresis 4mg /ml Dexamethasone , Taping, Dry Needling, Joint manipulation, and Spinal manipulation.   Liela Rylee MS, PT 03/29/2024 8:38 AM  "

## 2024-03-29 ENCOUNTER — Ambulatory Visit

## 2024-03-29 DIAGNOSIS — M25562 Pain in left knee: Secondary | ICD-10-CM

## 2024-03-29 DIAGNOSIS — M6281 Muscle weakness (generalized): Secondary | ICD-10-CM

## 2024-03-30 ENCOUNTER — Ambulatory Visit (INDEPENDENT_AMBULATORY_CARE_PROVIDER_SITE_OTHER): Admitting: Physician Assistant

## 2024-03-30 DIAGNOSIS — G5602 Carpal tunnel syndrome, left upper limb: Secondary | ICD-10-CM

## 2024-03-30 DIAGNOSIS — Z9889 Other specified postprocedural states: Secondary | ICD-10-CM

## 2024-03-30 NOTE — Progress Notes (Signed)
 "  Post-Op Visit Note   Patient: Julia Dunn           Date of Birth: [birth year]           MRN: [id number] Visit Date: 03/30/2024 PCP: [name], MD   Assessment & Plan:  Chief Complaint:  Chief Complaint  Patient presents with   Left Wrist - Follow-up    Left carpal tunnel release 03/02/2024   Visit Diagnoses:  1. Left carpal tunnel syndrome   2. S/P carpal tunnel release     Plan: Patient is a pleasant 53 year old female comes in today 4 weeks status post left carpal tunnel release for severe compression of median nerve found on nerve conduction study 4 years ago.  She is doing okay.  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  Still complaining of some paresthesias to the thumb, index and long fingertips.  Examination of the left wrist reveals a fully healed surgical scar without complication.  Fingers are warm well-perfused but she does have decreased sensation throughout the fingertips of the thumb, index and long fingers.  At this point, she will continue to increase activity as tolerated.  I did discuss that due to the severity of compression her symptoms may not fully return but to give this at least a year to see.  She will follow-up as needed.  Follow-Up Instructions: Return if symptoms worsen or fail to improve.   Orders:  No orders of the defined types were placed in this encounter.  No orders of the defined types were placed in this encounter.   Imaging: No new imaging  PMFS History: Patient Active Problem List   Diagnosis Date Noted   Chronic pain of left knee 01/26/2024   Depression, major, recurrent, moderate (HCC) 06/13/2022   Bilateral hand pain 08/09/2021   Left carpal tunnel syndrome 08/09/2021   Acute on chronic diastolic congestive heart failure (HCC) 08/03/2021   Closed displaced bimalleolar fracture of left ankle 01/04/2016   Abnormal EKG 06/22/2014   Abnormal CT scan of lung  06/22/2014   Dyspnea 06/19/2014   Microscopic hematuria 04/07/2014   Onychomycosis 05/31/2013   BI-RADS category 3 mammogram result 03/07/2013   Vitamin D  insufficiency 08/13/2011   Morbid obesity (HCC) 08/10/2007   ALLERGIC RHINITIS, SEASONAL 07/14/2006   DM2 (diabetes mellitus, type 2) 05/28/2006   DEPRESSIVE DISORDER, NOS 05/28/2006   HTN (hypertension) 05/28/2006   Past Medical History:  Diagnosis Date   Anemia Dx 2013   Asthma    Bacterial vaginosis 2008   Complication of anesthesia    pt states was informed had difficult time awakening following hysterectomy    Depression    Depression    Diabetes mellitus Dx 2005   Fibroids 09/05/2011   08/08/2011: Anteverted uterus. Diffuse fibroid involvement. Left of midline fibroid with submucosal component. 3.4cm x 2.8cmx 3.4cm.    GERD (gastroesophageal reflux disease)    Gestational diabetes    H/O shortness of breath    pt states has improved since lossing weight; triggers currently are enviromental pt states can make it to top of stairs w/o having to stop to catch her breath   Headache(784.0)    migraines   History of blood transfusion 2013/2014   History of bronchitis    Hx of blood clots    Hypertension Dx 2015   Kidney infection 2015   Menorrhagia 09/27/2010   Pneumonia    Postpartum depression  Pregnancy induced hypertension    Sleep apnea    mild, no CPAP recommended   Trichomonas     Family History  Problem Relation Age of Onset   Heart disease Mother    Rheumatic fever Mother    Hypertension Mother    Stroke Mother    CAD Mother    Hypertension Father    Hypertension Brother    Stroke Brother    Sickle cell trait Daughter    Heart disease Paternal Aunt    Breast cancer Paternal Aunt 62 - 71   Breast cancer Paternal Grandmother 56 - 43    Past Surgical History:  Procedure Laterality Date   ABDOMINAL HYSTERECTOMY N/A 11/03/2012   Procedure: HYSTERECTOMY ABDOMINAL;  Surgeon: Rome Rigg, MD;   Location: [facility];  Service: Gynecology;  Laterality: N/A;   BILATERAL SALPINGECTOMY Bilateral 11/03/2012   Procedure: BILATERAL SALPINGECTOMY;  Surgeon: Rome Rigg, MD;  Location: [facility];  Service: Gynecology;  Laterality: Bilateral;   BURCH PROCEDURE N/A 11/03/2012   Procedure: BURCH PROCEDURE;  Surgeon: Rome Rigg, MD;  Location: [facility];  Service: Gynecology;  Laterality: N/A;   CARPAL TUNNEL RELEASE Right 11/20/2021   Procedure: RIGHT CARPAL TUNNEL RELEASE;  Surgeon: Jerri Kay HERO, MD;  Location: [facility];  Service: Orthopedics;  Laterality: Right;   CARPAL TUNNEL RELEASE Left 03/02/2024   Procedure: CARPAL TUNNEL RELEASE;  Surgeon: Jerri Kay HERO, MD;  Location: [facility];  Service: Orthopedics;  Laterality: Left;   CYSTOSCOPY N/A 11/03/2012   Procedure: CYSTOSCOPY;  Surgeon: Rome Rigg, MD;  Location: [facility];  Service: Gynecology;  Laterality: N/A;   DILATION AND CURETTAGE OF UTERUS     LAPAROSCOPY N/A 11/03/2012   Procedure: LAPAROSCOPY DIAGNOSTIC;  Surgeon: Rome Rigg, MD;  Location: [facility];  Service: Gynecology;  Laterality: N/A;   ORIF ANKLE FRACTURE Left 01/04/2016   Procedure: OPEN REDUCTION INTERNAL FIXATION (ORIF) LEFT BIMALLEOLAR ANKLE FRACTURE;  Surgeon: Lonni CINDERELLA Poli, MD;  Location: [facility];  Service: Orthopedics;  Laterality: Left;   TUBAL LIGATION     WISDOM TOOTH EXTRACTION     3 teeth   Social History   Occupational History   Occupation: Unemployed  Tobacco Use   Smoking status: Never   Smokeless tobacco: Never  Vaping Use   Vaping status: Never Used  Substance and Sexual Activity   Alcohol use: Yes    Comment: social   Drug use: No   Sexual activity: Yes    Birth control/protection: Surgical    Comment: hysterectomy     "

## 2024-04-01 ENCOUNTER — Other Ambulatory Visit: Payer: Self-pay

## 2024-04-01 MED ORDER — OXYCODONE HCL 5 MG PO TABS
5.0000 mg | ORAL_TABLET | Freq: Four times a day (QID) | ORAL | 0 refills | Status: AC | PRN
  Filled 2024-04-01: qty 5, 2d supply, fill #0

## 2024-04-01 MED ORDER — IBUPROFEN 600 MG PO TABS
600.0000 mg | ORAL_TABLET | Freq: Four times a day (QID) | ORAL | 0 refills | Status: AC | PRN
  Filled 2024-04-01: qty 20, 5d supply, fill #0

## 2024-04-01 MED ORDER — ACETAMINOPHEN 500 MG PO TABS
500.0000 mg | ORAL_TABLET | ORAL | 0 refills | Status: AC | PRN
  Filled 2024-04-01: qty 20, 5d supply, fill #0

## 2024-04-01 MED ORDER — CHLORHEXIDINE GLUCONATE 0.12 % MT SOLN
15.0000 mL | Freq: Two times a day (BID) | OROMUCOSAL | 0 refills | Status: AC
  Filled 2024-04-01: qty 473, 16d supply, fill #0

## 2024-04-06 ENCOUNTER — Ambulatory Visit (INDEPENDENT_AMBULATORY_CARE_PROVIDER_SITE_OTHER): Admitting: Mental Health

## 2024-04-06 ENCOUNTER — Encounter (HOSPITAL_COMMUNITY): Payer: Self-pay

## 2024-04-06 DIAGNOSIS — F331 Major depressive disorder, recurrent, moderate: Secondary | ICD-10-CM | POA: Diagnosis not present

## 2024-04-06 NOTE — Progress Notes (Signed)
" ° °  THERAPIST PROGRESS NOTE  Session Time: 2:06pm ( 60 minutes)  Participation Level: Active  Behavioral Response: Casual, Neat, and Well GroomedAlertDepressed and Dysphoric  Type of Therapy: Individual Therapy  Treatment Goals addressed: STG:  I am tired Julia Dunn will decrease sxs of depression AEB engagement in self-care weekly with ability to set appropriate boundaries with self and others per self report within the next 90 days   STG: Julia Dunn will increase management of depression AEB identify and reframe maladaptive thinking patterns daily/as needed within the next 90 days.   ProgressTowards Goals: Initial  Interventions: Supportive  Summary: Julia Dunn is a 54 y.o. female who presents with diagnoses of major depression moderate. Presents to session alert and oriented; mood and affect low, depressed. Tearful at times. Shares chief complaint of  I am tired. 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 Notes thoughts on family stress and possibly taking in minor family member to be a support in care taking. Explores with therapist manner in which she has been a support for everyone with feelings of lacking support. Shares thoughts of having to manage and support everyone. Shares relationship with father as he ages. Agrees to work to increase better care of self and increasing mood. Denies safety concerns.   Suicidal/Homicidal: Nowithout intent/plan  Therapist Response: Therapist engaged in [place], who prefers to go by [name] in therapy session. Reviewed intake session, informed consent and bounds of confidentiality. Assessed for current level of functioning, sxs management and level of stressors. Provided safe space to share thoughts and feelings in regard to current stressors. Active empathic listening, providing support and encouragement; validated feelings.  Supported in exploring level of support for her and ability to take care of her mental and emotional wellbeing and ways in which weight of supporting others had had. Explores treatment plan. Provided follow up.   Plan: Return again in  x 5 weeks.  Diagnosis: Depression, major, recurrent, moderate (HCC)  Collaboration of Care: Other None  Patient/Guardian was advised Release of Information must be obtained prior to any record release in order to collaborate their care with an outside provider. Patient/Guardian was advised if they have not already done so to contact the registration department to sign all necessary forms in order for us  to release information regarding their care.   Consent: Patient/Guardian gives verbal consent for treatment and assignment of benefits for services provided during this visit. Patient/Guardian expressed understanding and agreed to proceed.   Ty Asal [place], [facility] 04/06/2024  "

## 2024-04-12 ENCOUNTER — Telehealth: Payer: Self-pay | Admitting: Diagnostic Neuroimaging

## 2024-04-12 NOTE — Telephone Encounter (Signed)
 LVM & sent mychart informing pt her 3/31 appt will need to be rescheduled. MD out

## 2024-04-14 ENCOUNTER — Other Ambulatory Visit: Payer: Self-pay

## 2024-04-15 ENCOUNTER — Ambulatory Visit: Admitting: Internal Medicine

## 2024-05-25 ENCOUNTER — Ambulatory Visit (HOSPITAL_COMMUNITY): Admitting: Mental Health

## 2024-05-31 ENCOUNTER — Ambulatory Visit: Admitting: Neurology

## 2024-06-08 ENCOUNTER — Ambulatory Visit (HOSPITAL_COMMUNITY): Admitting: Mental Health

## 2024-06-28 ENCOUNTER — Ambulatory Visit: Admitting: Diagnostic Neuroimaging
